# Patient Record
Sex: Female | Born: 1955
Health system: Southern US, Community
[De-identification: ages and names within clinical notes are randomized; demographics above are authoritative.]

## PROBLEM LIST (undated history)

## (undated) DIAGNOSIS — F319 Bipolar disorder, unspecified: Secondary | ICD-10-CM

## (undated) DIAGNOSIS — K529 Noninfective gastroenteritis and colitis, unspecified: Secondary | ICD-10-CM

## (undated) DIAGNOSIS — F32A Depression, unspecified: Secondary | ICD-10-CM

## (undated) DIAGNOSIS — H8110 Benign paroxysmal vertigo, unspecified ear: Principal | ICD-10-CM

## (undated) DIAGNOSIS — G709 Myoneural disorder, unspecified: Secondary | ICD-10-CM

## (undated) DIAGNOSIS — K589 Irritable bowel syndrome without diarrhea: Secondary | ICD-10-CM

## (undated) DIAGNOSIS — C801 Malignant (primary) neoplasm, unspecified: Secondary | ICD-10-CM

## (undated) DIAGNOSIS — F329 Major depressive disorder, single episode, unspecified: Secondary | ICD-10-CM

## (undated) DIAGNOSIS — M1990 Unspecified osteoarthritis, unspecified site: Secondary | ICD-10-CM

## (undated) DIAGNOSIS — N3090 Cystitis, unspecified without hematuria: Secondary | ICD-10-CM

## (undated) DIAGNOSIS — G809 Cerebral palsy, unspecified: Secondary | ICD-10-CM

## (undated) DIAGNOSIS — K219 Gastro-esophageal reflux disease without esophagitis: Secondary | ICD-10-CM

## (undated) DIAGNOSIS — N390 Urinary tract infection, site not specified: Secondary | ICD-10-CM

## (undated) DIAGNOSIS — R569 Unspecified convulsions: Secondary | ICD-10-CM

## (undated) DIAGNOSIS — J449 Chronic obstructive pulmonary disease, unspecified: Secondary | ICD-10-CM

## (undated) DIAGNOSIS — B379 Candidiasis, unspecified: Secondary | ICD-10-CM

## (undated) DIAGNOSIS — M4802 Spinal stenosis, cervical region: Secondary | ICD-10-CM

## (undated) DIAGNOSIS — E119 Type 2 diabetes mellitus without complications: Secondary | ICD-10-CM

## (undated) DIAGNOSIS — N879 Dysplasia of cervix uteri, unspecified: Secondary | ICD-10-CM

## (undated) DIAGNOSIS — D039 Melanoma in situ, unspecified: Secondary | ICD-10-CM

## (undated) DIAGNOSIS — N83209 Unspecified ovarian cyst, unspecified side: Secondary | ICD-10-CM

## (undated) DIAGNOSIS — A499 Bacterial infection, unspecified: Secondary | ICD-10-CM

## (undated) DIAGNOSIS — Z978 Presence of other specified devices: Secondary | ICD-10-CM

## (undated) DIAGNOSIS — A419 Sepsis, unspecified organism: Secondary | ICD-10-CM

## (undated) DIAGNOSIS — E220 Acromegaly and pituitary gigantism: Secondary | ICD-10-CM

## (undated) DIAGNOSIS — G629 Polyneuropathy, unspecified: Secondary | ICD-10-CM

## (undated) DIAGNOSIS — J189 Pneumonia, unspecified organism: Secondary | ICD-10-CM

## (undated) DIAGNOSIS — A599 Trichomoniasis, unspecified: Secondary | ICD-10-CM

## (undated) DIAGNOSIS — E7800 Pure hypercholesterolemia, unspecified: Secondary | ICD-10-CM

## (undated) DIAGNOSIS — R51 Headache: Secondary | ICD-10-CM

## (undated) DIAGNOSIS — G4730 Sleep apnea, unspecified: Secondary | ICD-10-CM

## (undated) DIAGNOSIS — R0989 Other specified symptoms and signs involving the circulatory and respiratory systems: Secondary | ICD-10-CM

## (undated) DIAGNOSIS — R221 Localized swelling, mass and lump, neck: Secondary | ICD-10-CM

## (undated) DIAGNOSIS — E876 Hypokalemia: Secondary | ICD-10-CM

## (undated) DIAGNOSIS — Z8619 Personal history of other infectious and parasitic diseases: Secondary | ICD-10-CM

## (undated) DIAGNOSIS — F419 Anxiety disorder, unspecified: Secondary | ICD-10-CM

## (undated) DIAGNOSIS — F688 Other specified disorders of adult personality and behavior: Secondary | ICD-10-CM

## (undated) DIAGNOSIS — N159 Renal tubulo-interstitial disease, unspecified: Secondary | ICD-10-CM

## (undated) HISTORY — DX: Unspecified ovarian cyst, unspecified side: N83.209

## (undated) HISTORY — DX: Spinal stenosis, cervical region: M48.02

## (undated) HISTORY — DX: Gastro-esophageal reflux disease without esophagitis: K21.9

## (undated) HISTORY — DX: Other specified disorders of adult personality and behavior: F68.8

## (undated) HISTORY — DX: Trichomoniasis, unspecified: A59.9

## (undated) HISTORY — DX: Candidiasis, unspecified: B37.9

## (undated) HISTORY — DX: Unspecified osteoarthritis, unspecified site: M19.90

## (undated) HISTORY — DX: Dysplasia of cervix uteri, unspecified: N87.9

## (undated) HISTORY — DX: Cystitis, unspecified without hematuria: N30.90

## (undated) HISTORY — DX: Noninfective gastroenteritis and colitis, unspecified: K52.9

## (undated) HISTORY — PX: APPENDECTOMY: SHX54

## (undated) HISTORY — DX: Cerebral palsy, unspecified: G80.9

## (undated) HISTORY — PX: JOINT REPLACEMENT: SHX530

## (undated) HISTORY — DX: Renal tubulo-interstitial disease, unspecified: N15.9

## (undated) HISTORY — DX: Major depressive disorder, single episode, unspecified: F32.9

## (undated) HISTORY — DX: Benign paroxysmal vertigo, unspecified ear: H81.10

## (undated) HISTORY — DX: Headache: R51

## (undated) HISTORY — DX: Malignant (primary) neoplasm, unspecified: C80.1

## (undated) HISTORY — PX: LEG TENDON SURGERY: SHX1004

## (undated) HISTORY — DX: Pure hypercholesterolemia, unspecified: E78.00

## (undated) HISTORY — DX: Localized swelling, mass and lump, neck: R22.1

## (undated) HISTORY — PX: CARPAL TUNNEL RELEASE: SHX101

## (undated) HISTORY — DX: Depression, unspecified: F32.A

## (undated) HISTORY — PX: OTHER SURGICAL HISTORY: SHX169

## (undated) HISTORY — DX: Personal history of other infectious and parasitic diseases: Z86.19

## (undated) HISTORY — DX: Anxiety disorder, unspecified: F41.9

## (undated) HISTORY — PX: NASAL FRACTURE SURGERY: SHX718

## (undated) HISTORY — DX: Type 2 diabetes mellitus without complications: E11.9

## (undated) HISTORY — DX: Bacterial infection, unspecified: A49.9

## (undated) HISTORY — PX: OVARIAN CYST REMOVAL: SHX89

## (undated) HISTORY — DX: Melanoma in situ, unspecified: D03.9

## (undated) HISTORY — DX: Irritable bowel syndrome, unspecified: K58.9

## (undated) HISTORY — DX: Acromegaly and pituitary gigantism: E22.0

---

## 1998-03-08 ENCOUNTER — Other Ambulatory Visit: Admission: RE | Admit: 1998-03-08 | Discharge: 1998-03-08 | Payer: Self-pay | Admitting: *Deleted

## 1998-08-08 ENCOUNTER — Other Ambulatory Visit: Admission: RE | Admit: 1998-08-08 | Discharge: 1998-08-08 | Payer: Self-pay | Admitting: *Deleted

## 1998-09-26 ENCOUNTER — Other Ambulatory Visit: Admission: RE | Admit: 1998-09-26 | Discharge: 1998-09-26 | Payer: Self-pay | Admitting: *Deleted

## 1999-02-19 ENCOUNTER — Emergency Department (HOSPITAL_COMMUNITY): Admission: EM | Admit: 1999-02-19 | Discharge: 1999-02-19 | Payer: Self-pay | Admitting: Emergency Medicine

## 1999-06-20 ENCOUNTER — Emergency Department (HOSPITAL_COMMUNITY): Admission: EM | Admit: 1999-06-20 | Discharge: 1999-06-20 | Payer: Self-pay | Admitting: *Deleted

## 1999-10-26 ENCOUNTER — Other Ambulatory Visit: Admission: RE | Admit: 1999-10-26 | Discharge: 1999-10-26 | Payer: Self-pay | Admitting: *Deleted

## 2000-04-01 ENCOUNTER — Emergency Department (HOSPITAL_COMMUNITY): Admission: EM | Admit: 2000-04-01 | Discharge: 2000-04-01 | Payer: Self-pay | Admitting: Emergency Medicine

## 2000-04-02 ENCOUNTER — Encounter: Payer: Self-pay | Admitting: Emergency Medicine

## 2000-04-06 ENCOUNTER — Emergency Department (HOSPITAL_COMMUNITY): Admission: EM | Admit: 2000-04-06 | Discharge: 2000-04-06 | Payer: Self-pay | Admitting: Emergency Medicine

## 2002-01-13 ENCOUNTER — Encounter: Admission: RE | Admit: 2002-01-13 | Discharge: 2002-01-13 | Payer: Self-pay | Admitting: Otolaryngology

## 2002-01-13 ENCOUNTER — Encounter: Payer: Self-pay | Admitting: Otolaryngology

## 2003-04-16 ENCOUNTER — Emergency Department (HOSPITAL_COMMUNITY): Admission: EM | Admit: 2003-04-16 | Discharge: 2003-04-16 | Payer: Self-pay | Admitting: Emergency Medicine

## 2003-04-16 ENCOUNTER — Encounter: Payer: Self-pay | Admitting: Emergency Medicine

## 2004-06-30 ENCOUNTER — Ambulatory Visit (HOSPITAL_BASED_OUTPATIENT_CLINIC_OR_DEPARTMENT_OTHER): Admission: RE | Admit: 2004-06-30 | Discharge: 2004-06-30 | Payer: Self-pay | Admitting: Surgery

## 2004-07-27 ENCOUNTER — Ambulatory Visit (HOSPITAL_BASED_OUTPATIENT_CLINIC_OR_DEPARTMENT_OTHER): Admission: RE | Admit: 2004-07-27 | Discharge: 2004-07-27 | Payer: Self-pay | Admitting: Orthopaedic Surgery

## 2004-09-15 ENCOUNTER — Ambulatory Visit: Payer: Self-pay | Admitting: Endocrinology

## 2004-09-18 ENCOUNTER — Ambulatory Visit: Payer: Self-pay | Admitting: Endocrinology

## 2004-09-25 ENCOUNTER — Ambulatory Visit: Payer: Self-pay | Admitting: Endocrinology

## 2004-10-16 ENCOUNTER — Ambulatory Visit: Payer: Self-pay | Admitting: Internal Medicine

## 2004-10-25 ENCOUNTER — Ambulatory Visit: Payer: Self-pay | Admitting: Endocrinology

## 2004-10-29 HISTORY — PX: REPLACEMENT TOTAL KNEE BILATERAL: SUR1225

## 2004-12-07 ENCOUNTER — Ambulatory Visit: Payer: Self-pay | Admitting: Endocrinology

## 2004-12-18 ENCOUNTER — Ambulatory Visit: Payer: Self-pay

## 2005-01-18 ENCOUNTER — Ambulatory Visit: Payer: Self-pay | Admitting: Internal Medicine

## 2005-04-16 ENCOUNTER — Ambulatory Visit: Payer: Self-pay | Admitting: Endocrinology

## 2005-04-18 ENCOUNTER — Ambulatory Visit: Payer: Self-pay | Admitting: Endocrinology

## 2005-04-30 ENCOUNTER — Ambulatory Visit: Payer: Self-pay | Admitting: Endocrinology

## 2005-06-06 ENCOUNTER — Ambulatory Visit (HOSPITAL_COMMUNITY): Admission: RE | Admit: 2005-06-06 | Discharge: 2005-06-06 | Payer: Self-pay

## 2005-07-04 ENCOUNTER — Ambulatory Visit: Payer: Self-pay | Admitting: Endocrinology

## 2005-09-13 ENCOUNTER — Ambulatory Visit: Payer: Self-pay | Admitting: Endocrinology

## 2005-10-31 ENCOUNTER — Ambulatory Visit: Payer: Self-pay | Admitting: Endocrinology

## 2005-12-05 ENCOUNTER — Ambulatory Visit: Payer: Self-pay | Admitting: Endocrinology

## 2006-03-06 ENCOUNTER — Ambulatory Visit: Payer: Self-pay | Admitting: Endocrinology

## 2006-03-12 ENCOUNTER — Inpatient Hospital Stay (HOSPITAL_COMMUNITY): Admission: RE | Admit: 2006-03-12 | Discharge: 2006-03-16 | Payer: Self-pay | Admitting: Orthopedic Surgery

## 2006-05-09 ENCOUNTER — Ambulatory Visit: Payer: Self-pay | Admitting: Endocrinology

## 2006-05-10 ENCOUNTER — Inpatient Hospital Stay (HOSPITAL_COMMUNITY): Admission: RE | Admit: 2006-05-10 | Discharge: 2006-05-15 | Payer: Self-pay | Admitting: Orthopedic Surgery

## 2006-05-13 ENCOUNTER — Ambulatory Visit: Payer: Self-pay | Admitting: Physical Medicine & Rehabilitation

## 2006-05-13 ENCOUNTER — Encounter: Payer: Self-pay | Admitting: Vascular Surgery

## 2006-05-18 ENCOUNTER — Emergency Department (HOSPITAL_COMMUNITY): Admission: EM | Admit: 2006-05-18 | Discharge: 2006-05-18 | Payer: Self-pay | Admitting: Emergency Medicine

## 2006-05-30 ENCOUNTER — Ambulatory Visit: Payer: Self-pay | Admitting: Endocrinology

## 2006-06-19 ENCOUNTER — Ambulatory Visit: Payer: Self-pay | Admitting: Gastroenterology

## 2006-07-24 ENCOUNTER — Ambulatory Visit: Payer: Self-pay | Admitting: Gastroenterology

## 2006-09-02 ENCOUNTER — Ambulatory Visit (HOSPITAL_COMMUNITY): Payer: Self-pay | Admitting: Psychiatry

## 2006-09-21 ENCOUNTER — Ambulatory Visit: Payer: Self-pay | Admitting: Family Medicine

## 2006-09-23 ENCOUNTER — Ambulatory Visit (HOSPITAL_COMMUNITY): Payer: Self-pay | Admitting: Psychiatry

## 2006-09-30 ENCOUNTER — Ambulatory Visit: Payer: Self-pay | Admitting: Endocrinology

## 2006-09-30 ENCOUNTER — Encounter: Admission: RE | Admit: 2006-09-30 | Discharge: 2006-09-30 | Payer: Self-pay | Admitting: Endocrinology

## 2006-09-30 LAB — CONVERTED CEMR LAB
Bilirubin Urine: NEGATIVE
CO2: 29 meq/L (ref 19–32)
Chloride: 104 meq/L (ref 96–112)
Creatinine, Ser: 0.7 mg/dL (ref 0.4–1.2)
GFR calc non Af Amer: 95 mL/min
Nitrite: NEGATIVE
Sodium: 141 meq/L (ref 135–145)
pH: 6 (ref 5.0–8.0)

## 2006-12-13 ENCOUNTER — Ambulatory Visit (HOSPITAL_COMMUNITY): Payer: Self-pay | Admitting: Psychiatry

## 2007-02-10 ENCOUNTER — Ambulatory Visit (HOSPITAL_COMMUNITY): Payer: Self-pay | Admitting: Psychiatry

## 2007-02-14 ENCOUNTER — Ambulatory Visit: Payer: Self-pay | Admitting: Endocrinology

## 2007-02-24 ENCOUNTER — Encounter: Admission: RE | Admit: 2007-02-24 | Discharge: 2007-02-24 | Payer: Self-pay | Admitting: Endocrinology

## 2007-02-26 ENCOUNTER — Encounter: Admission: RE | Admit: 2007-02-26 | Discharge: 2007-05-22 | Payer: Self-pay | Admitting: Orthopedic Surgery

## 2007-03-03 ENCOUNTER — Ambulatory Visit (HOSPITAL_COMMUNITY): Payer: Self-pay | Admitting: Psychiatry

## 2007-04-09 ENCOUNTER — Ambulatory Visit: Payer: Self-pay | Admitting: Endocrinology

## 2007-04-09 LAB — CONVERTED CEMR LAB
Albumin: 3.7 g/dL (ref 3.5–5.2)
Alkaline Phosphatase: 68 units/L (ref 39–117)
BUN: 9 mg/dL (ref 6–23)
Basophils Absolute: 0.1 10*3/uL (ref 0.0–0.1)
Basophils Relative: 0.7 % (ref 0.0–1.0)
CO2: 27 meq/L (ref 19–32)
Chloride: 101 meq/L (ref 96–112)
Cholesterol: 186 mg/dL (ref 0–200)
Creatinine, Ser: 0.6 mg/dL (ref 0.4–1.2)
Crystals: NEGATIVE
GFR calc non Af Amer: 113 mL/min
HCT: 41.7 % (ref 36.0–46.0)
HDL: 61.6 mg/dL (ref >39.0)
Hemoglobin, Urine: NEGATIVE
LDL Cholesterol: 92 mg/dL (ref 0–99)
Lymphocytes Relative: 27.4 % (ref 12.0–46.0)
MCHC: 34.8 g/dL (ref 30.0–36.0)
MCV: 84.2 fL (ref 78.0–100.0)
Monocytes Relative: 7.4 % (ref 3.0–11.0)
Potassium: 4 meq/L (ref 3.5–5.1)
RDW: 12.9 % (ref 11.5–14.6)
Specific Gravity, Urine: 1.02 (ref 1.000–1.03)
Total Bilirubin: 0.7 mg/dL (ref 0.3–1.2)
Triglycerides: 160 mg/dL — ABNORMAL HIGH (ref 0–149)
WBC: 7.6 10*3/uL (ref 4.5–10.5)
pH: 6 (ref 5.0–8.0)

## 2007-04-21 ENCOUNTER — Ambulatory Visit (HOSPITAL_COMMUNITY): Payer: Self-pay | Admitting: Psychiatry

## 2007-05-10 ENCOUNTER — Encounter: Payer: Self-pay | Admitting: Endocrinology

## 2007-05-10 DIAGNOSIS — E785 Hyperlipidemia, unspecified: Secondary | ICD-10-CM

## 2007-05-10 DIAGNOSIS — J309 Allergic rhinitis, unspecified: Secondary | ICD-10-CM | POA: Insufficient documentation

## 2007-05-10 DIAGNOSIS — F329 Major depressive disorder, single episode, unspecified: Secondary | ICD-10-CM

## 2007-05-14 ENCOUNTER — Ambulatory Visit: Payer: Self-pay | Admitting: Endocrinology

## 2007-05-14 LAB — CONVERTED CEMR LAB
ALT: 43 units/L — ABNORMAL HIGH (ref 0–35)
Bilirubin, Direct: 0.1 mg/dL (ref 0.0–0.3)
Total Bilirubin: 0.6 mg/dL (ref 0.3–1.2)

## 2007-06-16 ENCOUNTER — Ambulatory Visit (HOSPITAL_COMMUNITY): Payer: Self-pay | Admitting: Psychiatry

## 2007-06-17 ENCOUNTER — Ambulatory Visit: Payer: Self-pay | Admitting: Endocrinology

## 2007-06-17 LAB — CONVERTED CEMR LAB
BUN: 4 mg/dL — ABNORMAL LOW (ref 6–23)
Creatinine, Ser: 0.6 mg/dL (ref 0.4–1.2)

## 2007-06-23 ENCOUNTER — Ambulatory Visit: Payer: Self-pay | Admitting: Cardiology

## 2007-07-16 ENCOUNTER — Ambulatory Visit (HOSPITAL_COMMUNITY): Payer: Self-pay | Admitting: Psychiatry

## 2007-07-24 ENCOUNTER — Ambulatory Visit (HOSPITAL_COMMUNITY): Payer: Self-pay | Admitting: Licensed Clinical Social Worker

## 2007-07-31 ENCOUNTER — Ambulatory Visit (HOSPITAL_COMMUNITY): Payer: Self-pay | Admitting: Licensed Clinical Social Worker

## 2007-08-07 ENCOUNTER — Ambulatory Visit (HOSPITAL_COMMUNITY): Payer: Self-pay | Admitting: Licensed Clinical Social Worker

## 2007-08-12 ENCOUNTER — Ambulatory Visit (HOSPITAL_COMMUNITY): Payer: Self-pay | Admitting: Licensed Clinical Social Worker

## 2007-08-13 ENCOUNTER — Encounter: Payer: Self-pay | Admitting: Internal Medicine

## 2007-08-13 ENCOUNTER — Ambulatory Visit: Payer: Self-pay | Admitting: Internal Medicine

## 2007-08-13 ENCOUNTER — Ambulatory Visit (HOSPITAL_COMMUNITY): Payer: Self-pay | Admitting: Psychiatry

## 2007-08-13 DIAGNOSIS — E220 Acromegaly and pituitary gigantism: Secondary | ICD-10-CM

## 2007-08-13 DIAGNOSIS — H60509 Unspecified acute noninfective otitis externa, unspecified ear: Secondary | ICD-10-CM

## 2007-08-13 DIAGNOSIS — Z8719 Personal history of other diseases of the digestive system: Secondary | ICD-10-CM

## 2007-08-13 DIAGNOSIS — Z8639 Personal history of other endocrine, nutritional and metabolic disease: Secondary | ICD-10-CM | POA: Insufficient documentation

## 2007-08-13 DIAGNOSIS — Z862 Personal history of diseases of the blood and blood-forming organs and certain disorders involving the immune mechanism: Secondary | ICD-10-CM

## 2007-08-13 DIAGNOSIS — G809 Cerebral palsy, unspecified: Secondary | ICD-10-CM | POA: Insufficient documentation

## 2007-08-18 ENCOUNTER — Telehealth (INDEPENDENT_AMBULATORY_CARE_PROVIDER_SITE_OTHER): Payer: Self-pay | Admitting: *Deleted

## 2007-08-18 ENCOUNTER — Encounter: Payer: Self-pay | Admitting: Endocrinology

## 2007-08-21 ENCOUNTER — Ambulatory Visit (HOSPITAL_COMMUNITY): Payer: Self-pay | Admitting: Licensed Clinical Social Worker

## 2007-08-25 ENCOUNTER — Encounter: Payer: Self-pay | Admitting: Endocrinology

## 2007-09-01 ENCOUNTER — Ambulatory Visit (HOSPITAL_COMMUNITY): Payer: Self-pay | Admitting: Licensed Clinical Social Worker

## 2007-09-05 ENCOUNTER — Ambulatory Visit: Payer: Self-pay | Admitting: Internal Medicine

## 2007-09-05 ENCOUNTER — Encounter: Payer: Self-pay | Admitting: Internal Medicine

## 2007-09-05 DIAGNOSIS — J438 Other emphysema: Secondary | ICD-10-CM

## 2007-09-05 DIAGNOSIS — R220 Localized swelling, mass and lump, head: Secondary | ICD-10-CM

## 2007-09-05 DIAGNOSIS — R221 Localized swelling, mass and lump, neck: Secondary | ICD-10-CM

## 2007-09-11 ENCOUNTER — Ambulatory Visit (HOSPITAL_COMMUNITY): Payer: Self-pay | Admitting: Licensed Clinical Social Worker

## 2007-09-17 ENCOUNTER — Ambulatory Visit (HOSPITAL_COMMUNITY): Payer: Self-pay | Admitting: Licensed Clinical Social Worker

## 2007-09-23 ENCOUNTER — Ambulatory Visit (HOSPITAL_COMMUNITY): Payer: Self-pay | Admitting: Licensed Clinical Social Worker

## 2007-09-30 ENCOUNTER — Ambulatory Visit (HOSPITAL_COMMUNITY): Payer: Self-pay | Admitting: Licensed Clinical Social Worker

## 2007-10-03 ENCOUNTER — Encounter: Admission: RE | Admit: 2007-10-03 | Discharge: 2007-10-03 | Payer: Self-pay | Admitting: Otolaryngology

## 2007-10-09 ENCOUNTER — Ambulatory Visit (HOSPITAL_COMMUNITY): Payer: Self-pay | Admitting: Licensed Clinical Social Worker

## 2007-10-13 ENCOUNTER — Ambulatory Visit (HOSPITAL_COMMUNITY): Payer: Self-pay | Admitting: Psychiatry

## 2007-10-20 ENCOUNTER — Ambulatory Visit (HOSPITAL_COMMUNITY): Payer: Self-pay | Admitting: Licensed Clinical Social Worker

## 2007-10-27 ENCOUNTER — Ambulatory Visit (HOSPITAL_COMMUNITY): Payer: Self-pay | Admitting: Licensed Clinical Social Worker

## 2007-11-04 ENCOUNTER — Ambulatory Visit (HOSPITAL_COMMUNITY): Payer: Self-pay | Admitting: Licensed Clinical Social Worker

## 2007-11-19 ENCOUNTER — Ambulatory Visit (HOSPITAL_COMMUNITY): Payer: Self-pay | Admitting: Licensed Clinical Social Worker

## 2007-11-27 ENCOUNTER — Ambulatory Visit (HOSPITAL_COMMUNITY): Payer: Self-pay | Admitting: Licensed Clinical Social Worker

## 2007-12-04 ENCOUNTER — Ambulatory Visit (HOSPITAL_COMMUNITY): Payer: Self-pay | Admitting: Licensed Clinical Social Worker

## 2007-12-04 ENCOUNTER — Ambulatory Visit: Payer: Self-pay | Admitting: Endocrinology

## 2007-12-04 DIAGNOSIS — R21 Rash and other nonspecific skin eruption: Secondary | ICD-10-CM

## 2007-12-04 DIAGNOSIS — K589 Irritable bowel syndrome without diarrhea: Secondary | ICD-10-CM

## 2007-12-09 ENCOUNTER — Ambulatory Visit (HOSPITAL_COMMUNITY): Payer: Self-pay | Admitting: Licensed Clinical Social Worker

## 2007-12-10 ENCOUNTER — Ambulatory Visit (HOSPITAL_COMMUNITY): Payer: Self-pay | Admitting: Psychiatry

## 2007-12-25 ENCOUNTER — Ambulatory Visit (HOSPITAL_COMMUNITY): Payer: Self-pay | Admitting: Licensed Clinical Social Worker

## 2007-12-30 ENCOUNTER — Ambulatory Visit (HOSPITAL_COMMUNITY): Payer: Self-pay | Admitting: Licensed Clinical Social Worker

## 2008-01-07 ENCOUNTER — Ambulatory Visit (HOSPITAL_COMMUNITY): Payer: Self-pay | Admitting: Licensed Clinical Social Worker

## 2008-01-15 ENCOUNTER — Emergency Department (HOSPITAL_COMMUNITY): Admission: EM | Admit: 2008-01-15 | Discharge: 2008-01-15 | Payer: Self-pay | Admitting: Emergency Medicine

## 2008-01-23 ENCOUNTER — Encounter: Admission: RE | Admit: 2008-01-23 | Discharge: 2008-01-23 | Payer: Self-pay | Admitting: Family Medicine

## 2008-02-04 ENCOUNTER — Ambulatory Visit (HOSPITAL_COMMUNITY): Payer: Self-pay | Admitting: Psychiatry

## 2008-02-09 ENCOUNTER — Ambulatory Visit (HOSPITAL_COMMUNITY): Payer: Self-pay | Admitting: Licensed Clinical Social Worker

## 2008-02-23 ENCOUNTER — Ambulatory Visit (HOSPITAL_COMMUNITY): Payer: Self-pay | Admitting: Licensed Clinical Social Worker

## 2008-03-03 ENCOUNTER — Ambulatory Visit (HOSPITAL_COMMUNITY): Payer: Self-pay | Admitting: Psychiatry

## 2008-03-11 ENCOUNTER — Ambulatory Visit (HOSPITAL_COMMUNITY): Payer: Self-pay | Admitting: Licensed Clinical Social Worker

## 2008-04-19 ENCOUNTER — Ambulatory Visit (HOSPITAL_COMMUNITY): Payer: Self-pay | Admitting: Licensed Clinical Social Worker

## 2008-05-10 ENCOUNTER — Ambulatory Visit (HOSPITAL_COMMUNITY): Payer: Self-pay | Admitting: Psychiatry

## 2008-06-01 ENCOUNTER — Ambulatory Visit (HOSPITAL_COMMUNITY): Payer: Self-pay | Admitting: Licensed Clinical Social Worker

## 2008-06-14 ENCOUNTER — Ambulatory Visit (HOSPITAL_COMMUNITY): Payer: Self-pay | Admitting: Psychiatry

## 2008-06-15 ENCOUNTER — Ambulatory Visit (HOSPITAL_COMMUNITY): Payer: Self-pay | Admitting: Licensed Clinical Social Worker

## 2008-07-08 ENCOUNTER — Ambulatory Visit (HOSPITAL_COMMUNITY): Payer: Self-pay | Admitting: Licensed Clinical Social Worker

## 2008-07-19 ENCOUNTER — Ambulatory Visit (HOSPITAL_COMMUNITY): Payer: Self-pay | Admitting: Licensed Clinical Social Worker

## 2008-08-09 ENCOUNTER — Ambulatory Visit (HOSPITAL_COMMUNITY): Payer: Self-pay | Admitting: Psychiatry

## 2008-08-17 ENCOUNTER — Ambulatory Visit (HOSPITAL_COMMUNITY): Payer: Self-pay | Admitting: Licensed Clinical Social Worker

## 2008-09-02 ENCOUNTER — Ambulatory Visit (HOSPITAL_COMMUNITY): Payer: Self-pay | Admitting: Licensed Clinical Social Worker

## 2008-09-09 ENCOUNTER — Ambulatory Visit (HOSPITAL_COMMUNITY): Payer: Self-pay | Admitting: Licensed Clinical Social Worker

## 2008-09-21 ENCOUNTER — Ambulatory Visit (HOSPITAL_COMMUNITY): Payer: Self-pay | Admitting: Licensed Clinical Social Worker

## 2008-10-06 ENCOUNTER — Ambulatory Visit (HOSPITAL_COMMUNITY): Payer: Self-pay | Admitting: Licensed Clinical Social Worker

## 2008-10-20 ENCOUNTER — Ambulatory Visit (HOSPITAL_COMMUNITY): Payer: Self-pay | Admitting: Licensed Clinical Social Worker

## 2008-11-02 ENCOUNTER — Ambulatory Visit (HOSPITAL_COMMUNITY): Payer: Self-pay | Admitting: Licensed Clinical Social Worker

## 2008-11-03 ENCOUNTER — Ambulatory Visit (HOSPITAL_COMMUNITY): Payer: Self-pay | Admitting: Psychiatry

## 2008-11-15 ENCOUNTER — Ambulatory Visit (HOSPITAL_COMMUNITY): Payer: Self-pay | Admitting: Licensed Clinical Social Worker

## 2008-12-09 ENCOUNTER — Ambulatory Visit (HOSPITAL_COMMUNITY): Payer: Self-pay | Admitting: Licensed Clinical Social Worker

## 2008-12-16 ENCOUNTER — Ambulatory Visit (HOSPITAL_COMMUNITY): Payer: Self-pay | Admitting: Licensed Clinical Social Worker

## 2008-12-23 ENCOUNTER — Ambulatory Visit (HOSPITAL_COMMUNITY): Payer: Self-pay | Admitting: Licensed Clinical Social Worker

## 2009-01-03 ENCOUNTER — Ambulatory Visit (HOSPITAL_COMMUNITY): Payer: Self-pay | Admitting: Psychiatry

## 2009-01-06 ENCOUNTER — Ambulatory Visit (HOSPITAL_COMMUNITY): Payer: Self-pay | Admitting: Licensed Clinical Social Worker

## 2009-01-20 ENCOUNTER — Ambulatory Visit (HOSPITAL_COMMUNITY): Payer: Self-pay | Admitting: Licensed Clinical Social Worker

## 2009-02-03 ENCOUNTER — Ambulatory Visit (HOSPITAL_COMMUNITY): Payer: Self-pay | Admitting: Licensed Clinical Social Worker

## 2009-02-22 ENCOUNTER — Ambulatory Visit (HOSPITAL_COMMUNITY): Payer: Self-pay | Admitting: Licensed Clinical Social Worker

## 2009-02-28 ENCOUNTER — Ambulatory Visit (HOSPITAL_COMMUNITY): Payer: Self-pay | Admitting: Psychiatry

## 2009-03-08 ENCOUNTER — Ambulatory Visit (HOSPITAL_COMMUNITY): Payer: Self-pay | Admitting: Licensed Clinical Social Worker

## 2009-03-15 ENCOUNTER — Ambulatory Visit (HOSPITAL_COMMUNITY): Payer: Self-pay | Admitting: Licensed Clinical Social Worker

## 2009-03-28 ENCOUNTER — Emergency Department (HOSPITAL_COMMUNITY): Admission: EM | Admit: 2009-03-28 | Discharge: 2009-03-28 | Payer: Self-pay | Admitting: Family Medicine

## 2009-03-31 ENCOUNTER — Ambulatory Visit (HOSPITAL_COMMUNITY): Payer: Self-pay | Admitting: Licensed Clinical Social Worker

## 2009-04-06 ENCOUNTER — Ambulatory Visit (HOSPITAL_COMMUNITY): Payer: Self-pay | Admitting: Licensed Clinical Social Worker

## 2009-04-20 ENCOUNTER — Ambulatory Visit (HOSPITAL_COMMUNITY): Payer: Self-pay | Admitting: Licensed Clinical Social Worker

## 2009-05-04 ENCOUNTER — Ambulatory Visit (HOSPITAL_COMMUNITY): Payer: Self-pay | Admitting: Licensed Clinical Social Worker

## 2009-05-18 ENCOUNTER — Ambulatory Visit (HOSPITAL_COMMUNITY): Payer: Self-pay | Admitting: Licensed Clinical Social Worker

## 2009-06-15 ENCOUNTER — Ambulatory Visit (HOSPITAL_COMMUNITY): Payer: Self-pay | Admitting: Licensed Clinical Social Worker

## 2009-06-22 ENCOUNTER — Ambulatory Visit (HOSPITAL_COMMUNITY): Payer: Self-pay | Admitting: Psychiatry

## 2009-06-29 ENCOUNTER — Ambulatory Visit (HOSPITAL_COMMUNITY): Payer: Self-pay | Admitting: Licensed Clinical Social Worker

## 2009-07-14 ENCOUNTER — Ambulatory Visit (HOSPITAL_COMMUNITY): Payer: Self-pay | Admitting: Licensed Clinical Social Worker

## 2009-07-27 ENCOUNTER — Ambulatory Visit (HOSPITAL_COMMUNITY): Payer: Self-pay | Admitting: Licensed Clinical Social Worker

## 2009-08-10 ENCOUNTER — Ambulatory Visit (HOSPITAL_COMMUNITY): Payer: Self-pay | Admitting: Licensed Clinical Social Worker

## 2009-08-22 ENCOUNTER — Ambulatory Visit (HOSPITAL_COMMUNITY): Payer: Self-pay | Admitting: Psychiatry

## 2009-08-24 ENCOUNTER — Ambulatory Visit (HOSPITAL_COMMUNITY): Payer: Self-pay | Admitting: Licensed Clinical Social Worker

## 2009-09-13 ENCOUNTER — Encounter: Admission: RE | Admit: 2009-09-13 | Discharge: 2009-10-19 | Payer: Self-pay | Admitting: Orthopedic Surgery

## 2009-09-14 ENCOUNTER — Ambulatory Visit (HOSPITAL_COMMUNITY): Payer: Self-pay | Admitting: Licensed Clinical Social Worker

## 2009-09-28 ENCOUNTER — Ambulatory Visit (HOSPITAL_COMMUNITY): Payer: Self-pay | Admitting: Licensed Clinical Social Worker

## 2009-09-30 ENCOUNTER — Ambulatory Visit (HOSPITAL_COMMUNITY): Payer: Self-pay | Admitting: Psychiatry

## 2009-10-26 ENCOUNTER — Ambulatory Visit (HOSPITAL_COMMUNITY): Payer: Self-pay | Admitting: Licensed Clinical Social Worker

## 2009-11-14 ENCOUNTER — Ambulatory Visit (HOSPITAL_COMMUNITY): Payer: Self-pay | Admitting: Psychiatry

## 2009-12-02 ENCOUNTER — Ambulatory Visit (HOSPITAL_COMMUNITY): Payer: Self-pay | Admitting: Licensed Clinical Social Worker

## 2009-12-15 ENCOUNTER — Ambulatory Visit (HOSPITAL_COMMUNITY): Payer: Self-pay | Admitting: Licensed Clinical Social Worker

## 2009-12-28 ENCOUNTER — Ambulatory Visit (HOSPITAL_COMMUNITY): Payer: Self-pay | Admitting: Licensed Clinical Social Worker

## 2010-01-25 ENCOUNTER — Ambulatory Visit (HOSPITAL_COMMUNITY): Payer: Self-pay | Admitting: Licensed Clinical Social Worker

## 2010-02-06 ENCOUNTER — Ambulatory Visit (HOSPITAL_COMMUNITY): Payer: Self-pay | Admitting: Psychiatry

## 2010-02-09 ENCOUNTER — Ambulatory Visit (HOSPITAL_COMMUNITY): Payer: Self-pay | Admitting: Licensed Clinical Social Worker

## 2010-02-21 ENCOUNTER — Ambulatory Visit (HOSPITAL_COMMUNITY): Payer: Self-pay | Admitting: Licensed Clinical Social Worker

## 2010-03-07 ENCOUNTER — Ambulatory Visit (HOSPITAL_COMMUNITY): Payer: Self-pay | Admitting: Licensed Clinical Social Worker

## 2010-03-22 ENCOUNTER — Encounter: Admission: RE | Admit: 2010-03-22 | Discharge: 2010-03-22 | Payer: Self-pay | Admitting: Orthopaedic Surgery

## 2010-04-06 ENCOUNTER — Ambulatory Visit (HOSPITAL_COMMUNITY): Payer: Self-pay | Admitting: Licensed Clinical Social Worker

## 2010-04-20 ENCOUNTER — Ambulatory Visit (HOSPITAL_COMMUNITY): Payer: Self-pay | Admitting: Licensed Clinical Social Worker

## 2010-05-12 ENCOUNTER — Ambulatory Visit (HOSPITAL_COMMUNITY): Payer: Self-pay | Admitting: Licensed Clinical Social Worker

## 2010-05-22 ENCOUNTER — Ambulatory Visit (HOSPITAL_COMMUNITY): Payer: Self-pay | Admitting: Psychiatry

## 2010-05-25 ENCOUNTER — Ambulatory Visit (HOSPITAL_COMMUNITY): Payer: Self-pay | Admitting: Licensed Clinical Social Worker

## 2010-06-08 ENCOUNTER — Ambulatory Visit (HOSPITAL_COMMUNITY): Payer: Self-pay | Admitting: Licensed Clinical Social Worker

## 2010-06-27 ENCOUNTER — Ambulatory Visit (HOSPITAL_COMMUNITY): Payer: Self-pay | Admitting: Licensed Clinical Social Worker

## 2010-07-04 ENCOUNTER — Ambulatory Visit (HOSPITAL_COMMUNITY): Payer: Self-pay | Admitting: Licensed Clinical Social Worker

## 2010-07-18 ENCOUNTER — Ambulatory Visit (HOSPITAL_COMMUNITY): Payer: Self-pay | Admitting: Licensed Clinical Social Worker

## 2010-08-03 ENCOUNTER — Ambulatory Visit (HOSPITAL_COMMUNITY): Payer: Self-pay | Admitting: Licensed Clinical Social Worker

## 2010-08-10 ENCOUNTER — Ambulatory Visit (HOSPITAL_COMMUNITY): Payer: Self-pay | Admitting: Licensed Clinical Social Worker

## 2010-08-17 ENCOUNTER — Ambulatory Visit (HOSPITAL_COMMUNITY): Payer: Self-pay | Admitting: Licensed Clinical Social Worker

## 2010-08-21 ENCOUNTER — Ambulatory Visit (HOSPITAL_COMMUNITY): Payer: Self-pay | Admitting: Psychiatry

## 2010-09-07 ENCOUNTER — Ambulatory Visit (HOSPITAL_COMMUNITY): Payer: Self-pay | Admitting: Licensed Clinical Social Worker

## 2010-10-13 ENCOUNTER — Ambulatory Visit (HOSPITAL_COMMUNITY): Payer: Self-pay | Admitting: Licensed Clinical Social Worker

## 2010-10-30 ENCOUNTER — Ambulatory Visit (HOSPITAL_COMMUNITY): Payer: Self-pay | Admitting: Licensed Clinical Social Worker

## 2010-11-17 ENCOUNTER — Ambulatory Visit (HOSPITAL_COMMUNITY)
Admission: RE | Admit: 2010-11-17 | Discharge: 2010-11-17 | Payer: Self-pay | Source: Home / Self Care | Attending: Licensed Clinical Social Worker | Admitting: Licensed Clinical Social Worker

## 2010-11-22 ENCOUNTER — Ambulatory Visit (HOSPITAL_COMMUNITY)
Admission: RE | Admit: 2010-11-22 | Discharge: 2010-11-22 | Payer: Self-pay | Source: Home / Self Care | Attending: Psychiatry | Admitting: Psychiatry

## 2010-11-29 ENCOUNTER — Encounter (HOSPITAL_COMMUNITY): Payer: Medicare Other | Admitting: Licensed Clinical Social Worker

## 2010-11-29 ENCOUNTER — Ambulatory Visit (HOSPITAL_COMMUNITY): Admit: 2010-11-29 | Payer: Self-pay | Admitting: Licensed Clinical Social Worker

## 2010-11-29 DIAGNOSIS — F3289 Other specified depressive episodes: Secondary | ICD-10-CM

## 2010-11-29 DIAGNOSIS — F329 Major depressive disorder, single episode, unspecified: Secondary | ICD-10-CM

## 2010-12-11 ENCOUNTER — Encounter (HOSPITAL_COMMUNITY): Payer: Medicare Other | Admitting: Licensed Clinical Social Worker

## 2010-12-11 DIAGNOSIS — F329 Major depressive disorder, single episode, unspecified: Secondary | ICD-10-CM

## 2010-12-20 ENCOUNTER — Encounter (HOSPITAL_COMMUNITY): Payer: Medicare Other | Admitting: Licensed Clinical Social Worker

## 2010-12-20 DIAGNOSIS — F329 Major depressive disorder, single episode, unspecified: Secondary | ICD-10-CM

## 2011-01-10 ENCOUNTER — Encounter (HOSPITAL_COMMUNITY): Payer: Medicare Other | Admitting: Licensed Clinical Social Worker

## 2011-01-10 DIAGNOSIS — F329 Major depressive disorder, single episode, unspecified: Secondary | ICD-10-CM

## 2011-01-24 ENCOUNTER — Encounter (HOSPITAL_COMMUNITY): Payer: Medicare Other | Admitting: Licensed Clinical Social Worker

## 2011-01-29 ENCOUNTER — Encounter (HOSPITAL_BASED_OUTPATIENT_CLINIC_OR_DEPARTMENT_OTHER): Payer: Medicare Other | Admitting: Licensed Clinical Social Worker

## 2011-01-29 DIAGNOSIS — F329 Major depressive disorder, single episode, unspecified: Secondary | ICD-10-CM

## 2011-02-06 LAB — POCT URINALYSIS DIP (DEVICE)
Hgb urine dipstick: NEGATIVE
Nitrite: NEGATIVE
Protein, ur: NEGATIVE mg/dL
pH: 7 (ref 5.0–8.0)

## 2011-02-06 LAB — URINE CULTURE: Colony Count: 50000

## 2011-02-12 ENCOUNTER — Encounter (HOSPITAL_BASED_OUTPATIENT_CLINIC_OR_DEPARTMENT_OTHER): Payer: Medicare Other | Admitting: Licensed Clinical Social Worker

## 2011-02-12 DIAGNOSIS — F329 Major depressive disorder, single episode, unspecified: Secondary | ICD-10-CM

## 2011-02-21 ENCOUNTER — Encounter (HOSPITAL_COMMUNITY): Payer: Medicare Other | Admitting: Psychiatry

## 2011-02-21 DIAGNOSIS — F3289 Other specified depressive episodes: Secondary | ICD-10-CM

## 2011-02-21 DIAGNOSIS — F329 Major depressive disorder, single episode, unspecified: Secondary | ICD-10-CM

## 2011-02-26 ENCOUNTER — Encounter (HOSPITAL_BASED_OUTPATIENT_CLINIC_OR_DEPARTMENT_OTHER): Payer: Medicare Other | Admitting: Licensed Clinical Social Worker

## 2011-02-26 DIAGNOSIS — F3289 Other specified depressive episodes: Secondary | ICD-10-CM

## 2011-02-26 DIAGNOSIS — F329 Major depressive disorder, single episode, unspecified: Secondary | ICD-10-CM

## 2011-03-12 ENCOUNTER — Encounter (HOSPITAL_BASED_OUTPATIENT_CLINIC_OR_DEPARTMENT_OTHER): Payer: Medicare Other | Admitting: Licensed Clinical Social Worker

## 2011-03-12 DIAGNOSIS — F3289 Other specified depressive episodes: Secondary | ICD-10-CM

## 2011-03-12 DIAGNOSIS — F329 Major depressive disorder, single episode, unspecified: Secondary | ICD-10-CM

## 2011-03-16 NOTE — Op Note (Signed)
NAME:  [name], [name]                         ACCOUNT NO.:  [id number]   MEDICAL RECORD NO.:  [id number]                   PATIENT TYPE:  AMB   LOCATION:  DSC                                  FACILITY:  [facility]   PHYSICIAN:  Vanita Panda. Magnus Ivan, M.D.       DATE OF BIRTH:  [birth year]   DATE OF PROCEDURE:  06/30/2004  DATE OF DISCHARGE:                                 OPERATIVE REPORT   PREOPERATIVE DIAGNOSIS:  Left carpal tunnel syndrome.   POSTOPERATIVE DIAGNOSIS:  Left carpal tunnel syndrome.   PROCEDURE:  Left open carpal tunnel release.   SURGEON:  Vanita Panda. Magnus Ivan, M.D.   ANESTHESIA:  General.   COMPLICATIONS:  None.   TOURNIQUET TIME:  22 minutes.   INDICATIONS FOR PROCEDURE:  Briefly, Ms. Bouton is a 55 year old female with  a known history of bilateral carpal tunnel syndrome.  She had previous nerve  conduction studies in 2000.  She was followed up with her primary care  physician and after failure of night splints and nonsteroidals, a second  nerve conduction study was obtained this year.  Compared to the old studies,  this showed significant worsening in her neuropathy involving the median  nerve.  This was severe carpal tunnel syndrome.  She had motor and sensory  latencies that were noted.  On clinical exam, she had positive Tinel's and  Phalen's sign and positive nerve compression test.  She, fortunately, did  not have any evidence of muscle atrophy but certainly a weak grip strength.  It was recommended that she undergo open carpal tunnel release and she  agreed to proceed with surgery.   DESCRIPTION OF PROCEDURE:  After being brought to the operating room, she  was placed supine on the operating table, and general anesthesia was  obtained with LMA.  A nonsterile tourniquet was placed around her left upper  arm.  Her left arm was prepped and draped with DuraPrep and sterile drapes  with her left arm on the arm table and her left hand in the iron  hand.  Prior to putting her arm down, an Esmarch was used to wrap up the arm and  tourniquet was set for 250 mmHg.  A small incision was then made at the  palmar aspect of her hand just distal to the distal wrist crease.  This was  carried down to the level of the transverse carpal ligament.  This was  identified bluntly using a hemostat and blunt tip scissors.  Next, a Glorious Peach  was inserted underneath the transverse carpal ligament on top of the median  nerve.  The median nerve was easily identified and the Glorious Peach was inserted in  a retrograde fashion underneath the transverse carpal ligament.  The  transverse carpal ligament was then meticulously divided with blunt tip  scissors to allow complete release of the ligament and this was visualized.  Next, the median nerve was assessed and found to be intact and the motor  branch was identified, as well.  The wound was then irrigated and closed  with  interrupted 4-0 nylon suture in a horizontal mattress format with a total of  four sutures.  Xeroform was applied followed by sterile dressings.  The  tourniquet was deflated and the fingers did pinken nicely.  The patient was  awakened, extubated, and taken to the recovery room in stable condition.                                               Vanita Panda. Magnus Ivan, M.D.    CYB/MEDQ  D:  06/30/2004  T:  07/01/2004  Job:  [id number]

## 2011-03-16 NOTE — H&P (Signed)
[name], [name] NO.:  [id number]   MEDICAL RECORD NO.:  [id number]           PATIENT TYPE:   LOCATION:                                 FACILITY:   PHYSICIAN:  Madlyn Frankel. Charlann Boxer, M.D.  DATE OF BIRTH:  08/24/2005   DATE OF ADMISSION:  03/12/2006  DATE OF DISCHARGE:                                HISTORY & PHYSICAL   CHIEF COMPLAINT:  Pain into my right knee.[name]   PRESENT ILLNESS:  This 55 year old female with a history of diplegic  cerebral palsy has been seen by [place] for bilateral knee osteoarthritis.  She  has more difficulty on the right than the left.  In the past she has had a  right distal femoral rotational osteotomy in the 1990s; and removal of the  hardware in May 1995.  This hardware failed and a screw broke in her knee.  The screw  is a DCS-type in the distal femur; and is 1.5 cm from the femoral  notch to the screw itself.  She also has marked degenerative changes in both  knees worse on the right than the left.  This a very active lady who gets  around quite nicely despite her cerebral palsy.  She has been attending  college, and highly desires to maintain the highest level of activity that  she can.  Therefore, after much consideration including discussing with her  the risks and benefits of surgery we have decided to go ahead with total  knee replacement arthroplasty to the right knee with removal of the DCS  screw.   Recently the patient has been diagnosed with acromegaly.   DRUG ALLERGIES:  SUDAFED and MONISTAT.   PAST MEDICAL HISTORY:  The patient has migraine headaches, depression, and  borderline hypertension.  She has infrequent urinary tract infections.  She has had skin cancers removed as well.  Degenerative disk disease in her  lumbar spine.   PAST SURGERIES:  Carpal tunnel in 2005, hardware removal in 1994 for her  right knee, rotator cuff repair in 1998, cartilage removed in her knee in  1993, derotation osteotomy to the femur in  1991, tendon release in 1977,  ovarian cyst removed in 1978.   CURRENT MEDICATIONS:  The patient does not have her medications nor a list  her medications with her today.  Please refer to the medication  reconciliation sheet for current medications which will be obtained when she  goes to the hospital for her preoperative visit.   FAMILY HISTORY:  Positive for heart disease, hypertension, diabetes, cancer,  and emphysema.   SOCIAL HISTORY:  The patient is divorced, disabled, and has no intake of  tobacco and moderate amount of alcohol intake.  She has two children.  Her  daughter and boyfriend will be caretakers after surgery.   REVIEW OF SYSTEMS:  CNS:  No seizure disorder, but she has muscle weakness  consistent with diplegic cerebral palsy.  CARDIOVASCULAR:  No chest pain no  angina, nor orthopnea.  Respiratory:  No productive cough, no hemoptysis, no  shortness of breath.  GASTROINTESTINAL:  No nausea, vomiting,  melena, or  blood stool.  GENITOURINARY:  No discharge, dysuria, or hematuria.  MUSCULOSKELETAL:  Primarily as in present illness.   PHYSICAL EXAMINATION:  GENERAL:  An alert and cooperative friendly 49-year-  old white female who walks with a platform cane.  VITAL SIGNS:  Blood pressure 140/86, pulse 86, respirations 12.  HEENT: Normocephalic.  PERRLA intact.  Oropharynx clear.  CHEST:  Clear to auscultation. No rhonchi or rales.  HEART: Regular rate and rhythm.  No murmurs are heard.  ABDOMEN:  Soft, nontender, liver and spleen not felt.  GENITALIA, RECTAL, PELVIC, BREASTS:  Not done; not pertinent for this exam.  EXTREMITIES:  The patient has crepitus range of motion of the right knee and  a moderate amount of swelling noted.   ADMITTING DIAGNOSES:  1.  Right knee degenerative joint disease.  2.  Retained hardware right knee.  3.  Diplegic cerebral palsy  4.  Early diagnosis acromegaly.   PLAN:  The patient to undergo right total knee replacement arthroplasty  with  removal of the hardware, removal of the DCS screw in the distal femur.  In  all probability she will be able to go home with her family to care for her.      Dooley L. Cherlynn June.      Madlyn Frankel Charlann Boxer, M.D.  Electronically Signed    DLU/MEDQ  D:  03/04/2006  T:  03/04/2006  Job:  [id number]   cc:   Gregary Signs A. Everardo All, M.D. [facility]  [address]  [place]  [place] [address]   Madlyn Frankel. Charlann Boxer, M.D.  Fax: [phone]

## 2011-03-16 NOTE — Assessment & Plan Note (Signed)
[name]                           GASTROENTEROLOGY OFFICE NOTE   NAME:[name], [name]                      MRN:          [id number]  DATE:06/19/2006                            DOB:          Apr 01, 1956    REASON FOR REFERRAL:  Dr. Romero Belling asked me to evaluate [name] in  consultation regarding elevated risk for GI neoplasms, given her underlying  acromegaly.   HISTORY OF PRESENT ILLNESS:  [name] is a very pleasant 55 year old woman  who was recently diagnosed with acromegaly in the past 1 to 2 years. Because  of this, she is at increased risk for colorectal, esophageal, and stomach  cancers. She has had a colonoscopy in the distant past. About 17 years ago,  she was told that she had colitis and that it was caused by stress. She  takes a p.r.n. medicine for this. It is not clear that this was actually a  colitis. It may be irritable bowel syndrome. She does have intermittent  dysphagia symptoms and GERD symptoms that she takes p.r.n. H2 blockers only.   REVIEW OF SYSTEMS:  Notable for 5 to 10 pound weight gain in the past year.  Otherwise, essentially normal and is available on her admission intake  sheet.   PAST MEDICAL HISTORY:  Acromegaly as above, elevated cholesterol, arthritis,  anxiety, panic disorder, depression, appendectomy in 1978.   CURRENT MEDICATIONS:  Zyrtec, Diclofenac, Zocor, Vesicare, Skelaxin,  Neurontin, aspirin, melatonin, Aleve, and hydrocodone.   ALLERGIES:  MONISTAT (causes swelling) SULFA.   SOCIAL HISTORY:  Divorced. Two children. Disabled. Nonsmoker. Drinks 5 liter  box of wine approximately weekly.   FAMILY HISTORY:  Grandmother with heart disease. Brother with alcoholism.  Aunt passed away from colon cancer in her 79's.   PHYSICAL EXAMINATION:  VITAL SIGNS:  5 foot 5 inches, 180 pounds. Blood  pressure 118/60, pulse 80.  CONSTITUTIONAL:  Sitting in a wheelchair. Hard to move due to arthritis  symptoms. Overall well appearing.  NEUROLOGIC:  Alert and oriented times three.  HEENT:  Eyes:  Extraocular movements intact. Mouth:  Oropharynx moist. No  lesions.  NECK:  Supple. No lymphadenopathy.  CARDIOVASCULAR:  Regular rate and rhythm.  LUNGS:  Clear to auscultation bilaterally.  ABDOMEN:  Soft, nontender, and nondistended. Normal bowel sounds.  EXTREMITIES:  No lower extremity edema.  SKIN:  No rash or lesion visible on extremities.   ASSESSMENT:  A 55 year old woman at increased risk for colorectal, stomach,  and esophageal cancer due to her underlying acromegaly.   PLAN:  I think it is very reasonable that we proceed with upper and lower  endoscopy at her soonest convenience. I see no reason for any further blood  tests or imaging studies prior to that. She does report colitis diagnosed  17 years ago but it is not clear that she really did have colitis, as she  was told this was due to underlying stress. This may just be irritable bowel  syndrome.  Rachael Fee, MD   DPJ/MedQ  DD:  06/19/2006  DT:  06/19/2006  Job #:  [id number]   cc:   Gregary Signs A. Everardo All, MD

## 2011-03-16 NOTE — Op Note (Signed)
NAMEMIRELA, [name]               ACCOUNT NO.:  [id number]   MEDICAL RECORD NO.:  [id number]          PATIENT TYPE:  AMB   LOCATION:  DSC                          FACILITY:  [facility]   PHYSICIAN:  Vanita Panda. Magnus Ivan, M.D.DATE OF BIRTH:  Aug 06, 1956   DATE OF PROCEDURE:  07/27/2004  DATE OF DISCHARGE:                                 OPERATIVE REPORT   PREOPERATIVE DIAGNOSIS:  Right carpal tunnel syndrome.   POSTOPERATIVE DIAGNOSIS:  Right carpal tunnel syndrome.   OPERATION PERFORMED:  Right open carpal tunnel release.   SURGEON:  Vanita Panda. Magnus Ivan, M.D.   ANESTHESIA:  General.   TOURNIQUET TIME:  23 minutes.   ESTIMATED BLOOD LOSS:  Minimal.   COMPLICATIONS:  None.   INDICATIONS FOR PROCEDURE:  [name] is a 55 year old female with clinical  signs of carpal tunnel syndrome.  This involved her bilateral upper  extremities.  She had peripheral nerve conduction studies that did show  moderate to severe carpal tunnel affecting both the upper extremities.  Three weeks ago she underwent successful left open carpal tunnel release and  now presents for her carpal tunnel release on the right side.  She has since  healed the incision on the left side and I thought it was safe to proceed  with surgery on the right side.   DESCRIPTION OF PROCEDURE:  After being brought to the operating room, she  was placed supine on the operating table and general anesthesia was  obtained.  Her right arm was then placed on an arm table and the tourniquet  was then placed around her upper arm.  Her arm was then prepped and draped  with DuraPrep and sterile drapes.  An Esmarch was used to wrap out the arm  and the tourniquet was inflated to 250 mmHg pressure.  Incision was then  made at the wrist just ulnar to the thenar crease.  This was taken down  sharply through the skin and then blunt dissection was carried out to reveal  the distal most margin of the carpal tunnel.  A Glorious Peach was then  placed  between the carpal tunnel and the median nerve and blunt tip scissors were  used to release the carpal ligament from distal to proximal.  The median  nerve was then identified and found to be in continuity as well as the  palmar arch.  The recurrent motor branch was not identified.  The wound was then copiously  irrigated and closed with interrupted 4-0 nylon sutures.  The sterile  dressings were then applied and the tourniquet was deflated.  The fingers  all did pinken nicely.  The patient was awakened, extubated and taken to  recovery room in stable condition.       CYB/MEDQ  D:  07/27/2004  T:  07/28/2004  Job:  [id number]

## 2011-03-16 NOTE — Op Note (Signed)
[name], [name]               ACCOUNT NO.:  [id number]   MEDICAL RECORD NO.:  [id number]          PATIENT TYPE:  INP   LOCATION:  0001                         FACILITY:  [facility]   PHYSICIAN:  Madlyn Frankel. Charlann Boxer, M.D.  DATE OF BIRTH:  [birth year]   DATE OF PROCEDURE:  05/10/2006  DATE OF DISCHARGE:                                 OPERATIVE REPORT   PREOPERATIVE DIAGNOSIS:  Left knee osteoarthritis.   POSTOPERATIVE DIAGNOSIS:  Left knee osteoarthritis.   PROCEDURE:  Left total knee replacement.   COMPONENTS USED:  DePuy rotating platform, posterior-stabilized knee system  with size 3 femur, size 3 tibia, a 10-mm posterior-stabilized rotating  platform insert and a 35 patellar button.   SURGEON:  Madlyn Frankel. Charlann Boxer, M.D.   ASSISTANT:  Cherly Beach, P.A.   ANESTHESIA:  General plus a regional femoral block.   TOURNIQUET TIME:  Sixty minutes a 250 mmHg.   DRAINS:  x1.   COMPLICATIONS:  None.   INDICATIONS FOR PROCEDURE:  [name] is a 55 year old female with a  history of diplegia cerebral palsy, who presented to the office for  evaluation of bilateral knee osteoarthritis.  She had a recent history of  right total knee replacement by myself that was complicated by retained  hardware.  She has done well with that procedure.   She presented for the left total knee replacement at this point.   The risks and benefits of the procedures were reviewed and I discussed at  this point that I would be proceeding with a posterior stabilized component  just as this is my more routine procedure versus this cruciate-retaining  component.  Consent was obtained.   PROCEDURE IN DETAIL:  The patient was brought to operative theater.  Once  adequate anesthesia was established, I did perform an exam under anesthesia  on the right knee; it revealed that she can near full extension, lacking  only a few degrees.  She did have flexion there was fairly easy, to at least  120 degrees if not 130  degrees.   No manipulation was necessary in order to maximize this motion.   Following this, attention was directed then to the left knee.  A tourniquet  was placed on the proximal left thigh.  The left lower extremity was then  prepped and draped in a sterile fashion.  A midline incision was made that  took in account her valgus position of the lower extremity.  A medial  parapatellar arthrotomy, modified, was then carried out, meaning that the  patella was not everted, it was a subluxated.  I did do a Trivector approach  just onto the lateral border of the VMO insertion.  The medial side was not  excessively stripped due to her valgus nature.   Once exposure was obtained, the intramedullary canal of the femur was opened  and irrigated to prevent fat emboli.  An intramedullary rod was positioned;  5 degrees of valgus were utilized and 10 mm of her bone resected off the  distal femur.   Following this, I sized the femur to be a size 3.  It was somewhat between a  3 and 4 in the sizing, so I cut and placed a trimmed block between a 3 and a  4.  The rotation was excellent for external rotation, matching the white  sideline.   Anterior, posterior and chamfer cuts were then made, excessive bone debrided  off the posterior aspect of the femur.  I then went ahead and cut the distal  aspect of the femur with the trochlear box based off the lateral aspect of  the distal femur.   Following this, attention was directed the tibia.   Tibial exposure was obtained and with retractor placement, meniscectomies  carried out.  I then resected 10 mm of bone off the lateral aspect of the  proximal tibia.  Though this was the area with most wear, based on her  radiograph, this was the high side.   Ten millimeters of bone were resected without complication in the  perpendicular plane, AP and laterally.   Once I made this cut, A sizing block was positioned in extension, noting  that the knee came to  full extension with this and I was pleased.   At this point, a trial reduction was carried out with a fixed-bearing  prosthesis on the tibia to mark rotations.  A size 3 femur was placed with a  3 tibia.  The knee was noted to come to full extension and was stable.  Rotation was marked.   At this point, attention was directed to the patella.  A precut patella  measurement was 22 mm.  I resected down to 14 mm.   A 35 patellar button was chosen, which matched the proximal and distal  diameter.  The patella was noted to track without application of thumb  pressure.  The knee was stable from extension to flexion.   At this point, the attention was now directed to the final component  positioning.  The trial components were removed.  The final tibial  preparation was carried out with a size 3 tibial tray using the rotation  marked with a fixed-bearing prosthesis.  Drilling and keel punch were  carried out.   The size 3 component fit nicely on the proximal cut surface.   At this point, the knee was irrigated with normal saline solution and pulse  lavage.  I injected the knee was 60 mL of Marcaine with epinephrine and  Toradol 1 mL, 30 mg.  Cement was mixed.  The components were then cemented  into position with the tibia, femur and patella.  Excessive cement was  debrided while the cement cured.  Once the cement had cured, again excessive  cement was debrided out the posterior aspect of the knee and I was very  happy with the range of motion in full extension and good flexion and then I  from there placed a final 10-mm poly.  At this point, the tourniquet was let  down and the knee was again irrigated.  I placed a medium Hemovac drain.  I  reapproximated the extensor mechanism in flexion and closed the remainder of  the wound in layers with 4-0 Monocryl on the skin.  The knee was then  dressed Steri-Strips, dressing sponges and a bulky dressing.  She was brought to the recovery room in  stable condition.      Madlyn Frankel Charlann Boxer, M.D.  Electronically Signed     MDO/MEDQ  D:  05/10/2006  T:  05/10/2006  Job:  [id number]

## 2011-03-16 NOTE — Discharge Summary (Signed)
[name], [name] NO.:  [id number]   MEDICAL RECORD NO.:  [id number]          PATIENT TYPE:  INP   LOCATION:  1515                         FACILITY:  [facility]   PHYSICIAN:  Madlyn Frankel. Charlann Boxer, M.D.  DATE OF BIRTH:  [birth year]   DATE OF ADMISSION:  05/10/2006  DATE OF DISCHARGE:  05/15/2006                                 DISCHARGE SUMMARY   ADMITTING DIAGNOSES:  1.  Degenerative arthritis of the left knee.  2.  Status post right total knee replacement arthroplasty.  3.  Diplegic cerebral palsy.  4.  Early diagnosed acromegalia.   DISCHARGE DIAGNOSES:  1.  Degenerative arthritis of the left knee.  2.  Status post right total knee replacement arthroplasty.  3.  Diplegic cerebral palsy.  4.  Early diagnosed acromegalia.  5.  Ruptured Baker's cyst left lower extremity.  6.  Mild postoperative anemia.   OPERATION:  On May 10, 2006, the patient underwent left total knee  replacement arthroplasty utilizing DePuy rotating platform with posterior  stabilized knee system.  Katherine Campus, PA-C, assisted.   BRIEF HISTORY:  This 55 year old lady with diplegic cerebral palsy was seen  in our office primarily for bilateral knee end stage osteoarthritis.  She  successfully underwent a right total knee replacement arthroplasty in the  not too distant past, had done very well with that procedure.  X-rays have  shown marked arthritis, deterioration into the left knee, and after much  consideration, including the risks and benefits of surgery and taking in  light of the fact that she did very well with her right knee, she agreed to,  and was scheduled for, left total knee replacement arthroplasty.   COURSE IN THE HOSPITAL:  The patient tolerated the surgical procedure quite  well.  She was quite familiar with total knee protocol and began with her  inpatient physical therapy.  The wound remained clean and dry.  She was  noted oo have some swelling and increased pain near  the left calf area.  We  ordered Dopplers to be sure there was no evidence of any deep venous  thrombosis.  A ruptured Baker's cyst was noted on Dopplers and it was  NEGATIVE for deep venous thrombosis.  TED hose was used, as well as  elevation on this left lower extremity.   As the patient progressed through her in-hospital rehabilitation, both knees  were ranged and worked on by the therapist.  This was due to the fact that  she had just recently had a right total knee replacement arthroplasty.   It was learned that the patient did not have help at home.  We therefore  arranged for skilled nursing facility with a good rehabilitation program.  Once this was chosen and bed was available, she was transferred.  On the day  of discharge patient had been ambulating in her room, weightbearing as  tolerated, minimal assist.  She was quite anxious to continue with her  therapy so she could return to her home independent and safe.   On the day of discharge the wound was clean and  dry.  Moderate amount of  swelling with 1-2+ edema in the left lower extremity (from a ruptured  Baker's cyst).  Neurovascular is intact to the left lower extremity.   Laboratory values in the hospital hematologically showed a CBC  preoperatively complete within normal limits.  Hemoglobin 13.5.  Hematocrit  was 40.2.  The final hemoglobin was 9.8 with a hematocrit of 29.6.  Blood  chemistries stayed normal.  Urinalysis negative for urinary tract infection.  Chest x-ray showed a nodular density in the right midlung periphery, not  seen on prior chest CT, with lingular atelectasis or scar.  The CT scan  showed no evidence of thoracic neoplasm and moderate centrilobular emphysema  with no acute chest findings.  Echocardiogram from May of this year showed  normal sinus rhythm.   CONDITION ON DISCHARGE:  Improved, stable.   PLAN:  The patient is transferred to skilled nursing facility, I believe  [facility] has been chosen,  to continue with rehabilitation.  Physical therapy  can continue with weightbearing as tolerated.  Range of motion both active  and passive to both lower extremities.  Dry dressing to the left knee on an  as-needed basis.  Recommend thigh-high or knee-high TED hose to the left  lower extremity, elevate the left lower extremity when she is up, emphasis  on a pillow under the ankle and lower leg to assist in extension of the  knee.   Return to see Dr. Charlann Boxer 2 to 2-1/2 weeks after the date of surgery.  His  office number is [phone].  Any medical questions should be addressed toward  Dr. Romero Belling at [address] [place], [place], [place] [address].   Medications at discharge are:  1.  Ferrous sulfate 1 p.o. b.i.d. next 30 days.  2.  Tylenol 325 mg 2 q.6h.  3.  Ecotrin 325 tab EC b.i.d.  4.  Skelaxin 800 mg tab q.h.s.  5.  Lipitor 40 mg tab q. 1800 hours.  6.  Enablex 15 mg tab daily.  7.  Neurontin 300 mg tab, 1200 mg b.i.d.  8.  Valium 2 mg tab t.i.d.  9.  Vitamin B with C daily.  10. Multi Vit daily.  11. Oxycodone 5 mg q.3h. p.r.n. breakthrough pain.  12. Robaxin 500 mg p.o. alternating with Skelaxin q.6h. p.r.n. pain.   Again, any other medical question needs to be directed toward Dr. Everardo All.      Dooley L. Cherlynn June.      Madlyn Frankel Charlann Boxer, M.D.  Electronically Signed    DLU/MEDQ  D:  05/15/2006  T:  05/15/2006  Job:  [id number]   cc:   Gregary Signs A. Everardo All, M.D. [facility]  [address]  [place]  [place] [address]   Madlyn Frankel. Charlann Boxer, M.D.  Fax: [phone]

## 2011-03-16 NOTE — Op Note (Signed)
[name], [name]               ACCOUNT NO.:  [id number]   MEDICAL RECORD NO.:  [id number]          PATIENT TYPE:  INP   LOCATION:  NA                           FACILITY:  [facility]   PHYSICIAN:  Madlyn Frankel. Charlann Boxer, M.D.  DATE OF BIRTH:  06/05/1956   DATE OF PROCEDURE:  03/12/2006  DATE OF DISCHARGE:                                 OPERATIVE REPORT   PRINCIPAL DIAGNOSIS:  Right knee osteoarthritis in a patient with history of  biplegic cerebral palsy, with a history of right femoral derotational  osteotomy status post hardware removal with a retained DCS screw within the  distal femur.   POSTOPERATIVE DIAGNOSIS:  Right knee osteoarthritis in a patient with  history of biplegic cerebral palsy, with a history of right femoral  derotational osteotomy status post hardware removal with a retained DCS  screw within the distal femur.   PROCEDURE:  Computer navigated right total knee replacement.   COMPONENTS USED:  The A DePuy Sigma knee cruciate retaining size 4 femur,  size 3 tibial rotating platform tibial tray with a 12.5 x 4 size 4  polyethylene insert anterior lipped, cruciate retaining and a 35 patella  button.   SURGEON:  Madlyn Frankel. Charlann Boxer, M.D.   ASSISTANTDruscilla Brownie. Cherlynn June.   ANESTHESIA:  General plus regional femoral block.   BLOOD LOSS:  100 mL.   DRAINS:  One.   TOURNIQUET TIME:  93 minutes at 250 mmHg.   COMPLICATIONS:  None.   INDICATIONS FOR PROCEDURE:  Ms. Wauneka is a pleasant 55 year old female who  was referred for surgical consideration for a history of advanced right knee  osteoarthritis that had failed conservative measures including knee  arthroscopies, injections and anti-inflammatory use.  Importantly, her  history was reviewed that revealed a history of cerebral palsy with a  history of right femoral rotational osteotomy.  The patient had had the  hardware removed which was a large DCS plate but the DCS screw had fractured  and retained.  Upon  initial review of the patient in her current situation,  there was discussed about removing the hardware as a separate procedure  versus cruciate retaining procedure utilizing the computer to avoid  intramedullary penetration and removal of the screw.  Risks and benefits of  all potential options were discussed including standard risks of knee  replacement risk including DVT, infection, need for revision surgery,  component failure, persistent discomfort.   Consent was obtained for computer navigated cruciate retaining knee.   PROCEDURE IN DETAIL:  The patient was brought to the procedure.  Once  adequate anesthesia and preoperative antibiotics, 1 gram of Ancef, were  administered, the patient was positioned supine on the operating room table.  Proximal thigh tourniquet was placed.  This was placed tight enough to allow  for femoral pins for arrays.  Following this, the right lower extremity was  prepped and draped in sterile fashion following a prescrub.  The patient was  noted to have, on examination under anesthesia, 5 to 10 degree flexion  contracture.  Most of her wear appeared to be lateral.  Once the leg was prepped and draped, the femoral and tibial pins were placed  without complication for placement of the arrays.  At this point, the right  lower extremity was exsanguinated and tourniquet elevated to 250 mmHg.   A midline incision was made followed by a modified medial parapatellar  arthrotomy in the trivector plane and a VMO.  Exposure was obtained to allow  for patella subluxation as opposed to eversion.  Following exposure  including meniscectomy, attention was now directed to setting the computer  up.  The arrays were placed onto the tibial and femoral pins and landmarks  identified in order of reference per the computer.  Once landmark  referencing had been obtained including the hip center, we determined by the  computer standpoint, the patient had a 7 degree flexion  contracture and 10  degrees of valgus.   At this point, attention was directed to the femur.  Based on the flexion  contracture both clinically and by the computer, I chose to resect 10.7 mm  of bone off the distal femur.  This was done in an anatomic line, neutral to  the mechanical axis with the use of the computer.  Following this, I did  place the distal sizer onto the distal femur based on the posterior condyles  and went ahead and marked these holes.   I did reference the rotation of the femur based off the posterior condyles  which amounted to 3 degrees of external rotation.  There was a 1 degree  difference between white sideline epicondylar axis.  For that reason, I  chose it.  It matched perfectly from the sizing jig. I did size the femur be  a size 4.  The computer had sized it to be a size 5.  Anterior, posterior  and chamfer cuts were made without complication.  There was no notch and it  appeared to be perfect.   Following these cuts, attention was now directed to the tibia.   Tibial exposure was obtained including further meniscectomies with  protection against the posterior cruciate ligament.   Utilizing the computer guide with reference, I chose to resect initially 10  mm of bone off of high side.  This only amounted to a 9 mm cut laterally  which corresponded with radiographic appearance of symmetry.  Following  placement of pins and to make sure the correct rotation and neutral cut with  2 degrees of posterior slope this cut was made.  Again, protection against  the cruciate ligament was carried out.   Initial attempt at trial reduction felt that the middle size 10 polyethylene  insert that the knee was tight with a 8 degree flexion contracture.  For  this reason, I went ahead and cut 2 more millimeters of bone, making sure  that the plane of the cut was perpendicular based on the computer.  At this point, I put the trials back in with the size 4 femur, a size 3  tibia with  posterior cruciate retaining fixed bearing prosthesis was utilized for the  trial.  A 10 cm insert was placed.  At this point, the knee easily came out  to extension.  Also checked alignment and found it passed directed through  the center of the talus.   At this point with a trial components in position and reassessing the  computer alignment, revealed a slight tendency to flexion contracture though  it appeared stable with good gaps in  extension and flexion. I went  ahead  and cut the patella at this point with precut measurement of 22 mm,  resecting down to 15 and placing a 35 patellar button.  The patella tracked  without application of pressure.  No evidence of lift off laterally.   At this point, attention was now directed to final preparation of the  components with the tibial rotation marked based on the fixed bearing  prosthesis and tibial component.  I went ahead and fixed the tibia with keel  punching with a size 3 rotating platform bearing as this fit best compared  to a size 4, there was no overhang and good allowance for rotation.  Femoral  lug holes were drilled with position of the femur as it was based on the  lateral aspect of distal femur.   At this point, the final components were brought into the field holding the  polyethylene.  Final irrigation of the knee was carried out in preparation  for cementing.  The knee was injected at this point with 61 mL of Marcaine  with epinephrine and 1 mL of Toradol.   Once the cement was ready, the tibial component cemented in position first  followed by the femur.  The knee was brought to extension with 10 mm spacer.  Patellar bone was cemented and positioned.   It was during this trial and allowing the cement to cure that the knee  easily came to extension and was able to be hyperextended some.  This was  clinical evident as well.  For this reason, I went ahead and trialed a 12.5  poly.  With this, the knee came  out to full extension clinically as well as  to the computer with only about 1 degree flexion contracture and 3 degrees  of valgus based on the computer.   Given these parameters, I went ahead and chose the 12.5 poly as this seemed  to stabilize the knee better in both extension and flexion as well as  prevent any hyperextension.   Once the cement had cured, excessive cement was debrided from the knee the  final 12.4 x 4 polyethylene rotating platform insert was then inserted into  the tibial tray and the knee reduced.  Again the knee was irrigated.  Re-  examination of the knee for any loose fragments of cement was carried out.  Medium Hemovac drain was placed deep.   The extensor mechanism was reapproximated using #1 Vicryl with the knee in  flexion.  The remainder of the wound was closed in layers with a 4-0  Monocryl in the  skin.  The patient was then extubated and brought to  recovery in stable condition.     Madlyn Frankel Charlann Boxer, M.D.  Electronically Signed     MDO/MEDQ  D:  03/12/2006  T:  03/12/2006  Job:  [id number]

## 2011-03-16 NOTE — Discharge Summary (Signed)
NAMETIMIYAH, ROMITO NO.:  [id number]   MEDICAL RECORD NO.:  [id number]          PATIENT TYPE:  INP   LOCATION:  1504                         FACILITY:  [facility]   PHYSICIAN:  Madlyn Frankel. Charlann Boxer, M.D.  DATE OF BIRTH:  1955-11-08   DATE OF ADMISSION:  03/12/2006  DATE OF DISCHARGE:  03/16/2006                                 DISCHARGE SUMMARY   ADMISSION DIAGNOSIS:  1.  Right knee degenerative joint disease.  2.  Retained hardware right knee.  3.  Diplegic cerebral palsy.  4.  Early diagnosis of acromegaly.   DISCHARGE DIAGNOSES:  1.  Right knee degenerative joint disease.  2.  Retained hardware right knee.  3.  Diplegic cerebral palsy.  4.  Early diagnosis of acromegaly  5.  Mild postoperative anemia.   PROCEDURE:  On 03/12/06, the patient underwent computer-navigated right  total knee replacement arthroplasty, D.L. Idolina Primer PA-C assisted.   BRIEF HISTORY:  This 55 year old lady had advanced right knee  osteoarthritis.  Conservative measures including knee arthroscopies,  injections, anti-inflammatories have not helped her.  Her history of  cerebral palsy showed some right femoral rotation and resulted in right  rotational osteotomy.  She had the hardware removed from the leg, but a DC  screw fracture was retained.  After risks and benefits of surgery were  described to the patient, she decided to go ahead with total knee  replacement arthroplasty.  During the surgery, it was decided that we did  not have to remove the fractured screw that was retained, so we left it and  it did not interfere with her total knee.  The patient is entered into total  knee protocol postoperatively and was very eager to begin her therapy.  She  was up and ambulating in the hall, doing quite well.  Neurovascular intact  at the operative extremity.  Wound was clean and dry.  The patient was  placed on aspirin postoperatively and TED hose for DVT prophylaxis.  Hemoglobin remained  stable.  Laboratory values in the hospital  hematologically showed a CBC with differential done preoperatively  completely within normal limits.  Final hemoglobin was 10.3, hematocrit  30.5.  Blood chemistries were normal.  Urinalysis negative for urinary tract  infection.  Electrocardiogram showed normal sinus rhythm, no chest x-ray  seen on this chart.   CONDITION ON DISCHARGE:  Improved, stable.   PLAN:  The patient discharged to her home.  Vicodin given for discomfort,  Robaxin as a muscle relaxant, enteric coated aspirin 1 by mouth twice a day  x 2 weeks, and then 1 daily for 2 weeks.  She may return to resume her  Voltaren 75 mg by twice a day.  We will be doing her left total knee in the  not too distant future.  She should follow up with Dr. Romero Belling per his  instructions.      Dooley L. Cherlynn June.      Madlyn Frankel Charlann Boxer, M.D.  Electronically Signed    DLU/MEDQ  D:  04/01/2006  T:  04/02/2006  Job:  [id number]  cc:   Madlyn Frankel. Charlann Boxer, M.D.  Fax: [phone]   Sean A. Everardo All, M.D. [facility]  [address]  [place]  [place] [address]

## 2011-03-16 NOTE — H&P (Signed)
[name], [name]               ACCOUNT NO.:  [id number]   MEDICAL RECORD NO.:  [id number]          PATIENT TYPE:  INP   LOCATION:  NA                           FACILITY[facility]   PHYSICIAN:  Madlyn Frankel. Charlann Boxer, M.D.  DATE OF BIRTH:  10-Dec-1955   DATE OF ADMISSION:  05/10/2006  DATE OF DISCHARGE:                                HISTORY & PHYSICAL   CHIEF COMPLAINT:  Pain in my left knee.   PRESENT ILLNESS:  A 55 year old lady with history of cerebral palsy, is very  functional with her diplegic cerebral palsy.  She has successfully undergone  right total knee replacement arthroplasty in the not too distant past and  now presents for left total knee replacement arthroplasty.  The right knee  was done in May of this year.  She has done very well with that surgery but  is having continuing pains into the left knee with end-stage osteoarthritis.  She is very familiar with the parameters and the risks and benefits of  surgery.  Those were reviewed, and it was decided to go ahead with total  knee replacement arthroplasty of the left knee.   The only difference this time is the patient will not have constant home  care, will not be able to go to her mother's house as she did before, so we  will need to work with discharge planning to be sure that we have a safe  environment for her postoperative/post-hospitalization course.  She may even  need skilled nursing.  She essentially will be alone after her  hospitalization.   PAST MEDICAL HISTORY:  Her physician is Dr. Everardo All.  She has a history of  migraines, anxiety depression and pneumonia as a child.  She has ulcerative  colitis and hemorrhoids.  She is apparently being worked up for possible  acromegalia.  Past surgeries include a right total knee replacement in May  2007, carpal tunnel release in September 2005.   ALLERGIES:  SHE IS ALLERGIC TO SULFA WHICH CAUSES A RASH.  MONISTAT CAUSES  BURNING.   CURRENT MEDICATIONS:  The patient  did not bring her medications with her nor  did she bring a list.  I told her to take it to the preadmitting visit so  they can list it in the record.   FAMILY HISTORY:  Positive for heart disease, hypertension, diabetes.   SOCIAL HISTORY:  The patient is divorced, disabled.  No intake of tobacco  products.  About one liter of wine per day.  She has two children.   REVIEW OF SYSTEMS:  CNS:  No seizure disorder, paralysis, double vision.  The patient does have generalized weakness consistent with her mild CP.  CARDIOVASCULAR:  No chest pain, no angina or orthopnea.  RESPIRATORY:  No  productive cough, no hemoptysis, no shortness of breath.  GASTROINTESTINAL:  No nausea, vomiting or blood stool.  GENITOURINARY:  The patient has some  mild incontinence with urinary frequency and some urgency.  She also has  nocturia.  MUSCULOSKELETAL:  Primarily in present illness.   PHYSICAL EXAMINATION:  Alert, cooperative, friendly, 55 year old white  female who is seen in a wheelchair.  She usually walks with a walker.  VITAL SIGNS:  Blood pressure 150/80, pulse 72, respirations 12.  HEENT:  Normocephalic.  PERRLA, EOM intact.  Oropharynx is clear.  CHEST:  Clear to auscultation.  No rhonchi, no rales.  HEART:  Regular rate and rhythm.  No murmurs are heard.  ABDOMEN:  Soft, nontender.  Liver and spleen not felt.  GENITALIA, RECTAL, PELVIC, BREASTS:  Not done.  Not pertinent to present  illness.  EXTREMITIES:  Left knee with painful range of motion, some mild crepitus.   ADMITTING DIAGNOSES:  1.  Degenerative arthritis of the left knee.  2.  Status post right total knee replacement arthroplasty.  3.  Diplegic cerebral palsy.  4.  Early diagnosed acromegalia.   PLAN:  The patient will be admitted for total knee replacement arthroplasty  of the left knee.  Should we have any medical problems, we will certainly  call Dr. Everardo All or one of his associates to follow along with [place] during her   hospitalization.  Again as mentioned above, we will need to be quite  diligent as far as her post-hospital plans as she will essentially have no  help on an outpatient basis.  Hopefully, we can get her in the  rehabilitation program at [facility].  If not, she will need skilled  nursing with rehab facilities.      [name].      Madlyn Frankel Charlann Boxer, M.D.  Electronically Signed    DLU/MEDQ  D:  05/02/2006  T:  05/02/2006  Job:  [id number]   cc:   Gregary Signs A. Everardo All, M.D. [facility]  [address]  [place]  [place] [address]

## 2011-03-30 ENCOUNTER — Encounter (HOSPITAL_BASED_OUTPATIENT_CLINIC_OR_DEPARTMENT_OTHER): Payer: Medicare Other | Admitting: Licensed Clinical Social Worker

## 2011-03-30 DIAGNOSIS — F329 Major depressive disorder, single episode, unspecified: Secondary | ICD-10-CM

## 2011-04-11 ENCOUNTER — Encounter (HOSPITAL_BASED_OUTPATIENT_CLINIC_OR_DEPARTMENT_OTHER): Payer: Medicare Other | Admitting: Licensed Clinical Social Worker

## 2011-04-11 DIAGNOSIS — F329 Major depressive disorder, single episode, unspecified: Secondary | ICD-10-CM

## 2011-04-25 ENCOUNTER — Encounter (HOSPITAL_BASED_OUTPATIENT_CLINIC_OR_DEPARTMENT_OTHER): Payer: Medicare Other | Admitting: Licensed Clinical Social Worker

## 2011-04-25 DIAGNOSIS — F329 Major depressive disorder, single episode, unspecified: Secondary | ICD-10-CM

## 2011-05-09 ENCOUNTER — Encounter (HOSPITAL_BASED_OUTPATIENT_CLINIC_OR_DEPARTMENT_OTHER): Payer: Medicare Other | Admitting: Licensed Clinical Social Worker

## 2011-05-09 DIAGNOSIS — F329 Major depressive disorder, single episode, unspecified: Secondary | ICD-10-CM

## 2011-05-23 ENCOUNTER — Encounter (INDEPENDENT_AMBULATORY_CARE_PROVIDER_SITE_OTHER): Payer: Medicare Other | Admitting: Psychiatry

## 2011-05-23 DIAGNOSIS — F3189 Other bipolar disorder: Secondary | ICD-10-CM

## 2011-05-25 ENCOUNTER — Encounter (HOSPITAL_BASED_OUTPATIENT_CLINIC_OR_DEPARTMENT_OTHER): Payer: Medicare Other | Admitting: Licensed Clinical Social Worker

## 2011-05-25 DIAGNOSIS — F319 Bipolar disorder, unspecified: Secondary | ICD-10-CM

## 2011-06-06 ENCOUNTER — Encounter (HOSPITAL_COMMUNITY): Payer: Medicare Other | Admitting: Licensed Clinical Social Worker

## 2011-06-20 ENCOUNTER — Encounter (INDEPENDENT_AMBULATORY_CARE_PROVIDER_SITE_OTHER): Payer: Medicare Other | Admitting: Licensed Clinical Social Worker

## 2011-06-20 DIAGNOSIS — F259 Schizoaffective disorder, unspecified: Secondary | ICD-10-CM

## 2011-06-20 DIAGNOSIS — F319 Bipolar disorder, unspecified: Secondary | ICD-10-CM

## 2011-06-25 ENCOUNTER — Encounter: Payer: Self-pay | Admitting: Gastroenterology

## 2011-07-04 ENCOUNTER — Encounter (INDEPENDENT_AMBULATORY_CARE_PROVIDER_SITE_OTHER): Payer: Medicare Other | Admitting: Licensed Clinical Social Worker

## 2011-07-04 DIAGNOSIS — F319 Bipolar disorder, unspecified: Secondary | ICD-10-CM

## 2011-07-27 ENCOUNTER — Encounter (HOSPITAL_BASED_OUTPATIENT_CLINIC_OR_DEPARTMENT_OTHER): Payer: Medicare Other | Admitting: Licensed Clinical Social Worker

## 2011-07-27 DIAGNOSIS — F319 Bipolar disorder, unspecified: Secondary | ICD-10-CM

## 2011-08-07 ENCOUNTER — Other Ambulatory Visit: Payer: Self-pay | Admitting: Family Medicine

## 2011-08-08 ENCOUNTER — Encounter (INDEPENDENT_AMBULATORY_CARE_PROVIDER_SITE_OTHER): Payer: Medicare Other | Admitting: Licensed Clinical Social Worker

## 2011-08-08 DIAGNOSIS — F319 Bipolar disorder, unspecified: Secondary | ICD-10-CM

## 2011-08-09 ENCOUNTER — Ambulatory Visit
Admission: RE | Admit: 2011-08-09 | Discharge: 2011-08-09 | Disposition: A | Discharge status: Home / Self Care | Payer: Medicare Other | Source: Ambulatory Visit | Attending: Family Medicine | Admitting: Family Medicine

## 2011-08-22 ENCOUNTER — Encounter (INDEPENDENT_AMBULATORY_CARE_PROVIDER_SITE_OTHER): Payer: Medicare Other | Admitting: Psychiatry

## 2011-08-22 DIAGNOSIS — F3189 Other bipolar disorder: Secondary | ICD-10-CM

## 2011-08-28 ENCOUNTER — Encounter (INDEPENDENT_AMBULATORY_CARE_PROVIDER_SITE_OTHER): Payer: Medicare Other | Admitting: Licensed Clinical Social Worker

## 2011-08-28 DIAGNOSIS — F319 Bipolar disorder, unspecified: Secondary | ICD-10-CM

## 2011-09-05 ENCOUNTER — Encounter (HOSPITAL_COMMUNITY): Payer: Medicare Other | Admitting: Licensed Clinical Social Worker

## 2011-11-07 ENCOUNTER — Ambulatory Visit (HOSPITAL_COMMUNITY): Payer: Medicare Other | Admitting: Licensed Clinical Social Worker

## 2011-11-16 ENCOUNTER — Ambulatory Visit (INDEPENDENT_AMBULATORY_CARE_PROVIDER_SITE_OTHER): Payer: Medicare Other | Admitting: Psychiatry

## 2011-11-16 ENCOUNTER — Other Ambulatory Visit (HOSPITAL_COMMUNITY): Payer: Self-pay | Admitting: Psychiatry

## 2011-11-16 DIAGNOSIS — F319 Bipolar disorder, unspecified: Secondary | ICD-10-CM

## 2011-11-16 MED ORDER — ATOMOXETINE HCL 40 MG PO CAPS: 40.0000 mg | ORAL_CAPSULE | Freq: Every day | ORAL | Status: DC

## 2011-11-16 MED ORDER — AMITRIPTYLINE HCL 50 MG PO TABS: 50.0000 mg | ORAL_TABLET | Freq: Every day | ORAL | Status: DC

## 2011-11-16 MED ORDER — LAMOTRIGINE 150 MG PO TABS: 150.0000 mg | ORAL_TABLET | Freq: Every day | ORAL | Status: DC

## 2011-11-16 NOTE — Progress Notes (Signed)
Patient came for her followup appointment. 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 Patient likes her job and she is more busy and active in her daily life. She is sleeping good. She continues to have issues with her daughter but they're less intense and less frequent. She had a very good Christmas and she visited her mother in [place]. She denies any agitation anger mood swings. She has seen recently her primary care physician Dr. Anna Genre who has a started some medication but she does not remember the name and dosage of medication. On her last visit we have reduce her Strattera to 60 mg as patient having high liver enzymes. Patient to have another blood work however the results were not available but patient told there is still high. She denies any agitation anger or mood swings she is able to focus and concentrate.  Mental status examination Patient is pleasant calm cooperative. Her speech is soft clear and coherent. She described her mood is anxious and her affect is mood congruent. She denies any active or passive suicidal thoughts or homicidal thoughts. There no psychotic symptoms present. She's alert and oriented x3. Her insight judgment and pulse control is okay.  Assessment Bipolar disorder NOS ADD by history  Plan At this time I do believe patient is doing very well on Lamictal and Elavil. Since we do not have recent liver enzymes results I have recommended to further cut down her Strattera to 40 mg. I have requested percent recent blood work to [place]. I recommended to call us if she has any question or concern about the medication. The patient continued to do very well with reduced dose of Strattera we will consider completely taking her off on next visit. I will see her again in 4 weeks

## 2011-11-21 ENCOUNTER — Encounter (HOSPITAL_COMMUNITY): Payer: Self-pay | Admitting: Licensed Clinical Social Worker

## 2011-11-21 ENCOUNTER — Ambulatory Visit (INDEPENDENT_AMBULATORY_CARE_PROVIDER_SITE_OTHER): Payer: Medicare Other | Admitting: Licensed Clinical Social Worker

## 2011-11-21 DIAGNOSIS — F319 Bipolar disorder, unspecified: Secondary | ICD-10-CM

## 2011-11-21 NOTE — Progress Notes (Signed)
   THERAPIST PROGRESS NOTE  Session Time: 2:00pm-2:50pm  Participation Level: Active  Behavioral Response: Well GroomedAlertEuthymic  Type of Therapy: Individual Therapy  Treatment Goals addressed: Coping  Interventions: CBT, Solution Focused, Strength-based and Supportive  Summary: [name] is a 56 y.o. female who presents with euthymic mood and bright affect. This is her first session in two months since this therapist has been out on leave. She reports doing well and is proud to report that she got a job. She endorses feeling much happier since she was hired, with improved self esteem and self worth. She continues to have anxiety about both her children, particularly her daughter, who is now being treated for severe depression. Patient is fearful that her daughter will try to kill herself again. She checks in on her multiple times a day to make sure she is okay. She remains angry with her son, who has destroyed some of her property. She is doing well upholing her boundaries and set firmer ones with her boyfriend. Her sleep and appetite are wnl.    Suicidal/Homicidal: Nowithout intent/plan  Therapist Response: Overall, patient has done very well while this therapist has been out on leave. Discussed the need to continue therapy since patient has been maintaining well. She does not want to stop entirely as she is concerned that something will go wrong and she will need help navigating. Will space out the sessions and will see her in one month to reassess. Assisted patient with the expression of her feelings of frustration and anxiety. Reviewed patients self care plan. Assessed her progress related to self care. Patient's self care is fair. Recommend proper diet, regular exercise, socialization and recreation. Used motivational interviewing to assist and encourage patient through the change process. Explored patients barriers to change.   Plan: Return again in four  weeks.  Diagnosis: Axis I: Bipolar, Depressed    Axis II: Deferred    Otila Starn, LCSW 11/21/2011

## 2011-12-13 ENCOUNTER — Ambulatory Visit (HOSPITAL_COMMUNITY): Payer: Medicare Other | Admitting: Licensed Clinical Social Worker

## 2011-12-14 ENCOUNTER — Ambulatory Visit (INDEPENDENT_AMBULATORY_CARE_PROVIDER_SITE_OTHER): Payer: Self-pay | Admitting: Licensed Clinical Social Worker

## 2011-12-14 DIAGNOSIS — F319 Bipolar disorder, unspecified: Secondary | ICD-10-CM

## 2011-12-14 NOTE — Progress Notes (Signed)
   THERAPIST PROGRESS NOTE  Session Time: 2:00pm-2:50pm  Participation Level: Active  Behavioral Response: Well GroomedAlertEuthymic  Type of Therapy: Individual Therapy  Treatment Goals addressed: Coping  Interventions: Motivational Interviewing and Supportive  Summary: [name] is a 56 y.o. female who presents with euthymic mood and bright affect. She reports doing very well since she got a job. She enjoys the work, enjoys getting paid and having something to do with a purpose. Her mood is significantly improved and she understands that being out of the house and around others is healthy for her. She is happy that her son got a job and that her daughter is going to counseling and has started medication. She has decreased irritability with her boyfriend. Her sleep and appetite are wnl. She has not walked and is afraid it is too late because her legs are weak.    Suicidal/Homicidal: Nowithout intent/plan  Therapist Response: Patient is doing very well and demonstrates a positive attitude about her life. She is hopeful about the future and eager to go to work. Processed w/ patient her stress over her student loans coming due and her plan. Processed w/pt her lack of motivation to practice walking and her fear and avoidance of the pain she will feel when she starts. Used motivational interviewing to assist and encourage patient through the change process. Explored patients barriers to change. Reviewed patients self care plan. Assessed her progress related to self care. Patient's self care is fair. Recommend proper diet, regular exercise, socialization and recreation.   Plan: Return again in four weeks.  Diagnosis: Axis I: Bi polar disorder    Axis II: No diagnosis    Viridiana Spaid, LCSW 12/14/2011

## 2011-12-19 ENCOUNTER — Ambulatory Visit (HOSPITAL_COMMUNITY): Payer: Medicare Other | Admitting: Licensed Clinical Social Worker

## 2012-01-08 ENCOUNTER — Ambulatory Visit (INDEPENDENT_AMBULATORY_CARE_PROVIDER_SITE_OTHER): Payer: Self-pay | Admitting: Licensed Clinical Social Worker

## 2012-01-08 ENCOUNTER — Encounter (HOSPITAL_COMMUNITY): Payer: Self-pay | Admitting: *Deleted

## 2012-01-08 ENCOUNTER — Encounter (HOSPITAL_COMMUNITY): Payer: Self-pay | Admitting: Licensed Clinical Social Worker

## 2012-01-08 DIAGNOSIS — F319 Bipolar disorder, unspecified: Secondary | ICD-10-CM

## 2012-01-08 NOTE — Progress Notes (Signed)
   THERAPIST PROGRESS NOTE  Session Time: 2:00pm-2:50pm  Participation Level: Active  Behavioral Response: Well GroomedAlertAnxious and Irritable  Type of Therapy: Individual Therapy  Treatment Goals addressed: Anger, Anxiety and Coping  Interventions: CBT, Strength-based, Supportive and Reframing  Summary: [name] is a 56 y.o. female who presents with anxious mood and irritable affect. She is agitated throughout the session because her good friend who works with her has caused problems for patient at work. She feels stuck in the middle of conflict because her friend quit in an impulsive act and the boss will not allow her to return. Patient is anxious and defensive and does not want her friend to cause her any problems with her job. She is fearful of loosing her job and told her friend that if she causes this to happen, she will not be friends with her again. She is also angry that she received another phone call from the woman she suspects her boyfriend is cheating on her with. She wants to call the police and file a harassment charge. She loves her job and it continues to be a positive experience for her.  Her sleep and appetite are wnl,   Suicidal/Homicidal: Nowithout intent/plan  Therapist Response: Processed w/pt her anger and frustration. Encouraged patient to express her feelings and to explore strategies to manage her agitation. Used DBT to help patient build distress tolerance, create distraction ideas and communicate her feelings more effectively. Used CBT to assist patient with the identification of her negative distortions and irrational thoughts. Encouraged patient to verbalize alternative and factual responses which challenge her distortions. Reviewed patients self care plan. Assessed her progress related to self care. Patient's self care is good. Recommend proper diet, regular exercise, socialization and recreation.   Plan: Return again in four weeks.  Diagnosis: Axis  I: bi polar    Axis II: No diagnosis    Laylanie Kruczek, LCSW 01/08/2012

## 2012-01-08 NOTE — Progress Notes (Signed)
[employer] has authorized Strattera 40 mg as of 01/07/12. Duration of authorization not given. Pharmacy and patient notified 01/08/12 at [phone]

## 2012-01-14 ENCOUNTER — Ambulatory Visit (HOSPITAL_COMMUNITY): Payer: Medicare Other | Admitting: Psychiatry

## 2012-01-30 ENCOUNTER — Encounter (HOSPITAL_COMMUNITY): Payer: Self-pay | Admitting: Psychiatry

## 2012-01-30 ENCOUNTER — Ambulatory Visit (HOSPITAL_COMMUNITY): Payer: Medicare Other | Admitting: Psychiatry

## 2012-01-30 ENCOUNTER — Ambulatory Visit (INDEPENDENT_AMBULATORY_CARE_PROVIDER_SITE_OTHER): Payer: Medicare Other | Admitting: Psychiatry

## 2012-01-30 DIAGNOSIS — F319 Bipolar disorder, unspecified: Secondary | ICD-10-CM

## 2012-01-30 MED ORDER — LAMOTRIGINE 150 MG PO TABS: 150.0000 mg | ORAL_TABLET | Freq: Every day | ORAL | Status: DC

## 2012-01-30 MED ORDER — AMITRIPTYLINE HCL 50 MG PO TABS: 50.0000 mg | ORAL_TABLET | Freq: Every day | ORAL | Status: DC

## 2012-01-30 MED ORDER — ATOMOXETINE HCL 40 MG PO CAPS
40.0000 mg | ORAL_CAPSULE | Freq: Every day | ORAL | Status: DC
Start: 2012-01-30 — End: 2012-03-28

## 2012-01-30 NOTE — Progress Notes (Signed)
Chief complaint Medication management and followup.  History of present illness Patient is 56 year old Caucasian female who came for her followup appointment.  She's stable on her psychiatric medication.  She is very happy that her daughter also taking psychiatric medication and doing very well .  Patient denies any recent agitation anger or mood swings.  She endorse sometime frustration and irritability but overall her mood has been stable .  She sleeps 6-7 hours without any problem.  She is seeing therapist however recently she does not require to see her every 2 weeks .  She has some issues with her daughter but they are less intense and less frequent.  She is seeing primary care physician for management of diabetes and high cholesterol.  She did not remember the details of her medication and dosage but agreed to bring list of medication and recent or past on her next appointment.  Current psychiatric medication Strattera 40 mg daily Amitriptyline 50 mg at bedtime Lamictal 150 mg daily   Medical history Hyperlipidemia, cerebral palsy, borderline diabetes , acromegaly and irritable bowel syndrome .    Mental status examination Patient is casually dressed and well groomed.  She uses electronic wheelchair for ambulation .  She is pleasant calm and cooperative. Her speech is soft clear and coherent. She described her mood is anxious and her affect is mood congruent. She denies any active or passive suicidal thoughts or homicidal thoughts.  She denies any auditory or visual hallucination.  Her attention and concentration is good .  There no psychotic symptoms present. She's alert and oriented x3. Her insight judgment and pulse control is okay.  Assessment Axis I Bipolar disorder NOS, ADD by history Axis II deferred Axis III see medical history Axis IV mild to moderate Axis V 60-65  Plan At this time I do believe patient is doing well on Lamictal and Elavil.  One more time I encouraged her to  have recent blood work faxed to [place] .  I also recommend to bring list of medication on her next followup appointment.  I explained risks and benefits of medication in detail.  She will continue to see therapist regularly.  I will see her again in 2 months.

## 2012-01-31 ENCOUNTER — Other Ambulatory Visit (HOSPITAL_COMMUNITY): Payer: Self-pay

## 2012-01-31 ENCOUNTER — Telehealth (HOSPITAL_COMMUNITY): Payer: Self-pay

## 2012-01-31 ENCOUNTER — Other Ambulatory Visit (HOSPITAL_COMMUNITY): Payer: Self-pay | Admitting: Psychiatry

## 2012-01-31 DIAGNOSIS — F319 Bipolar disorder, unspecified: Secondary | ICD-10-CM

## 2012-01-31 MED ORDER — AMITRIPTYLINE HCL 50 MG PO TABS: 50.0000 mg | ORAL_TABLET | Freq: Every day | ORAL | Status: DC

## 2012-01-31 MED ORDER — LAMOTRIGINE 150 MG PO TABS: 150.0000 mg | ORAL_TABLET | Freq: Every day | ORAL | Status: DC

## 2012-01-31 NOTE — Telephone Encounter (Signed)
Request for 90 day slupply of Lamotrigine and Amitriptyline received from [facility] pharmacy. Last office visit 01/30/12.

## 2012-01-31 NOTE — Telephone Encounter (Signed)
Received faxed request from

## 2012-02-08 ENCOUNTER — Ambulatory Visit (INDEPENDENT_AMBULATORY_CARE_PROVIDER_SITE_OTHER): Payer: Medicare Other | Admitting: Licensed Clinical Social Worker

## 2012-02-08 DIAGNOSIS — F319 Bipolar disorder, unspecified: Secondary | ICD-10-CM

## 2012-02-08 NOTE — Progress Notes (Signed)
   THERAPIST PROGRESS NOTE  Session Time: 2:00pm-2:50pm  Participation Level: Active  Behavioral Response: Well GroomedAlertEuthymic  Type of Therapy: Individual Therapy  Treatment Goals addressed: Coping  Interventions: Motivational Interviewing, Strength-based and Supportive  Summary: [name] is a 56 y.o. female who presents with euthymic mood and bright affect. She reports doing well, with some frustration towards her boyfriend and her son. She got into a big argument with her boyfriend and describes confronting him of keeping items that other women have given him, while he has cheated. She was able to communicate her concerns and move on. Her work hours have decreased and she is disappointed by this, but hopeful that they will increase again. Overall, she is doing very well, except she is not walking. She says she wants to walk, but doesn't have time. Her sleep and appetite are wnl.   Suicidal/Homicidal: Nowithout intent/plan  Therapist Response: Reviewed patients progress and assessed her current functioning. Explored her motivation to walk and challenged her statement supporting her desire to walk verses her action of avoidance of it. Patient has made signficant progress and has sustaned this progress for a long period of time. Talked about discharge which patient agrees with. Explored her feelings related to terminating therapy and reviewed what she has learned. Recommend patient return if she needs to in the future. She will continue to be seen by Dr. Lolly Mustache for medication management.   Plan: Patient is discharged from therapy. She has completed her care.   Diagnosis: Axis I: bi polra    Axis II: No diagnosis    Alexanderjames Berg, LCSW 02/08/2012

## 2012-03-06 ENCOUNTER — Encounter: Payer: Self-pay | Admitting: Gastroenterology

## 2012-03-28 ENCOUNTER — Other Ambulatory Visit (HOSPITAL_COMMUNITY): Payer: Self-pay | Admitting: Psychology

## 2012-03-28 DIAGNOSIS — F319 Bipolar disorder, unspecified: Secondary | ICD-10-CM

## 2012-03-28 MED ORDER — ATOMOXETINE HCL 40 MG PO CAPS: 40.0000 mg | ORAL_CAPSULE | Freq: Every day | ORAL | Status: DC

## 2012-03-31 ENCOUNTER — Ambulatory Visit (INDEPENDENT_AMBULATORY_CARE_PROVIDER_SITE_OTHER): Payer: Medicare Other | Admitting: Psychiatry

## 2012-03-31 ENCOUNTER — Encounter (HOSPITAL_COMMUNITY): Payer: Self-pay | Admitting: Psychiatry

## 2012-03-31 VITALS — BP 126/86 | HR 100

## 2012-03-31 DIAGNOSIS — F319 Bipolar disorder, unspecified: Secondary | ICD-10-CM

## 2012-03-31 MED ORDER — AMITRIPTYLINE HCL 75 MG PO TABS: 75.0000 mg | ORAL_TABLET | Freq: Every day | ORAL | Status: DC

## 2012-03-31 NOTE — Progress Notes (Signed)
Chief complaint I have been feeling more anxious and complain of poor sleep due to pain.    History of present illness Patient is 56 year old Caucasian female who came for her followup appointment.  Patient described recently her pain has been getting worse and she has unable to sleep very well.  She used to take Neurontin in heavy dose however Neurontin has cause swelling in her feet and she could not take more than 300 mg at bedtime.  Patient brought the list of medication which is prescribed by her primary care physician.  She also brought recent blood test which was done by her primary care physician.  Patient admitted sometime poor sleep, racing thoughts anxiety spells.  She continues to have difficulty trusting her boyfriend.  She is getting phone calls from his ex-girlfriend and when she tries to confront him he gets very irritable.  Patient sleeps 5-6 hours but her sleep is restless.  Patient denies any agitation anger or mood swings.  She denies any crying spells but endorse increased anxiety.  She denies any active or passive suicidal thinking.  Current psychiatric medication Strattera 40 mg daily Amitriptyline 50 mg at bedtime Lamictal 150 mg daily   Medical history Hyperlipidemia, cerebral palsy, borderline diabetes , acromegaly and irritable bowel syndrome .  Her last blood work which was done on April 25 shows white blood count 7.7, RBC 5 point 32, hemoglobin 14.9, platelet 246, her glucose was 181, amylase 22, BUN 7 and creatinine 54.48-year-old.  Her case she was marginally elevated 87 along with ALT 91.  Patient has history of high liver function test.  Patient also brought a list of medication. She is taking pravastatin,.  Number, Vistaril, dicyclomine and gabapentin.  Mental status examination Patient is casually dressed and well groomed.  She uses electronic wheelchair for ambulation .  She appears tired but pleasant and relevant in conversation.Her speech is soft clear and  coherent. She described her mood is anxious and her affect is mood congruent. She denies any active or passive suicidal thoughts or homicidal thoughts.  She denies any auditory or visual hallucination.  Her attention and concentration is good .  There no psychotic symptoms present. She's alert and oriented x3. Her insight judgment and pulse control is okay.  Assessment Axis I Bipolar disorder NOS, ADD by history Axis II deferred Axis III see medical history Axis IV mild to moderate Axis V 60-65  Plan I review her blood test, list of medication and psychosocial stressor.  I do believe patient will get benefit if we increase her amitriptyline to 75 mg at bedtime.  However I recommend to reduce her gabapentin.  I will continue her Strattera and Lamictal to her present does.  I recommend to call us if she has any question or concern about the medication or if she feel worsening of the symptoms.  Time spent 30 minutes.  I will see her again in one month.

## 2012-04-02 ENCOUNTER — Encounter: Payer: Self-pay | Admitting: Obstetrics and Gynecology

## 2012-04-02 ENCOUNTER — Ambulatory Visit (INDEPENDENT_AMBULATORY_CARE_PROVIDER_SITE_OTHER): Payer: Medicare Other | Admitting: Obstetrics and Gynecology

## 2012-04-02 VITALS — BP 120/80 | Temp 98.1°F | Ht 67.0 in | Wt 232.0 lb

## 2012-04-02 DIAGNOSIS — Z124 Encounter for screening for malignant neoplasm of cervix: Secondary | ICD-10-CM

## 2012-04-02 DIAGNOSIS — N879 Dysplasia of cervix uteri, unspecified: Secondary | ICD-10-CM | POA: Insufficient documentation

## 2012-04-02 DIAGNOSIS — D039 Melanoma in situ, unspecified: Secondary | ICD-10-CM | POA: Insufficient documentation

## 2012-04-02 DIAGNOSIS — C439 Malignant melanoma of skin, unspecified: Secondary | ICD-10-CM

## 2012-04-02 NOTE — Progress Notes (Signed)
Last Pap: 12/2009 WNL: Yes Regular Periods:no Contraception: none  Monthly Breast exam:no Tetanus<44yrs:yes Nl.Bladder Function:yes Daily BMs:yes Healthy Diet:yes Calcium:yes Mammogram:yes Exercise:no Seatbelt: yes Abuse at home: no Stressful work:no Sigmoid-colonoscopy: 70yrs ago wnl per pr Bone Density: No Physical Examination: Neck - supple, no significant adenopathy Chest - clear to auscultation, no wheezes, rales or rhonchi, symmetric air entry Heart - normal rate, regular rhythm, normal S1, S2, no murmurs, rubs, clicks or gallops Abdomen - soft, nontender, nondistended, no masses or organomegaly Breasts - breasts appear normal, no suspicious masses, no skin or nipple changes or axillary nodes Pelvic - normal external genitalia, vulva, vagina, cervix, uterus and adnexa, atrophic Rectal - normal rectal, no masses Musculoskeletal - no joint tenderness, deformity or swelling Extremities - peripheral pulses normal, no pedal edema, no clubbing or cyanosis Skin - normal coloration and turgor, no rashes, no suspicious skin lesions noted Normal AEX Pt due for mammogram yes scheduled for tomorrow colonoscopy due no Pap done yes pt with h/o cervical dysplasia RT one year Diet and exercise discussed

## 2012-04-04 LAB — PAP IG W/ RFLX HPV ASCU

## 2012-04-15 ENCOUNTER — Encounter: Payer: Self-pay | Admitting: Gastroenterology

## 2012-04-18 ENCOUNTER — Ambulatory Visit (AMBULATORY_SURGERY_CENTER): Payer: Medicare Other | Admitting: *Deleted

## 2012-04-18 VITALS — Ht 67.0 in | Wt 234.0 lb

## 2012-04-18 DIAGNOSIS — K219 Gastro-esophageal reflux disease without esophagitis: Secondary | ICD-10-CM

## 2012-04-18 DIAGNOSIS — Z1211 Encounter for screening for malignant neoplasm of colon: Secondary | ICD-10-CM

## 2012-04-18 MED ORDER — MOVIPREP 100 G PO SOLR: ORAL | Status: DC

## 2012-04-24 ENCOUNTER — Telehealth (HOSPITAL_COMMUNITY): Payer: Self-pay | Admitting: *Deleted

## 2012-04-24 ENCOUNTER — Other Ambulatory Visit (HOSPITAL_COMMUNITY): Payer: Self-pay | Admitting: *Deleted

## 2012-04-24 DIAGNOSIS — F319 Bipolar disorder, unspecified: Secondary | ICD-10-CM

## 2012-04-24 NOTE — Telephone Encounter (Signed)
Contacted patient. Last refill was 03/27/12 for #[phone]. Next appt is 04/28/12.Per patient has enough medicine to wait on refill.

## 2012-04-25 ENCOUNTER — Other Ambulatory Visit (HOSPITAL_COMMUNITY): Payer: Self-pay | Admitting: Psychiatry

## 2012-04-25 DIAGNOSIS — F319 Bipolar disorder, unspecified: Secondary | ICD-10-CM

## 2012-04-25 MED ORDER — LAMOTRIGINE 150 MG PO TABS: 150.0000 mg | ORAL_TABLET | Freq: Every day | ORAL | Status: DC

## 2012-04-28 ENCOUNTER — Ambulatory Visit (HOSPITAL_COMMUNITY): Payer: Self-pay | Admitting: Psychiatry

## 2012-04-30 ENCOUNTER — Other Ambulatory Visit: Payer: Self-pay | Admitting: Gastroenterology

## 2012-04-30 ENCOUNTER — Ambulatory Visit (INDEPENDENT_AMBULATORY_CARE_PROVIDER_SITE_OTHER): Payer: Medicare Other | Admitting: Psychiatry

## 2012-04-30 DIAGNOSIS — F319 Bipolar disorder, unspecified: Secondary | ICD-10-CM

## 2012-04-30 MED ORDER — ATOMOXETINE HCL 40 MG PO CAPS: 40.0000 mg | ORAL_CAPSULE | Freq: Every day | ORAL | Status: DC

## 2012-04-30 NOTE — Progress Notes (Signed)
Chief complaint I was taking accidentally both amitriptyline .  I was sleeping better but now I'm taking only 75 mg.      History of present illness Patient is 56 year old Caucasian female who came for her followup appointment.  Patient endorse her accidentally she was taking amitriptyline 50 mg and 75 mg together .  She do not realize until recently she corrected the dosage.  She admitted sleeping better when she was taking higher dose of amitriptyline.  She also feel less pain .  She is wondering if amitriptyline dose can be further increased.  I review her medication she is taking hydroxyzine , melatonin along with other medication.  It is unclear why she is taking hydroxyzine and melatonin for sleep .  Overall she is doing better.  She has chronic stressor but there has been no new issues .  She denies any agitation anger mood swing.  She endorse some pain since Neurontin has been reduced by her Dr. 2 months ago due to swelling.  She denies any active or passive suicidal thoughts.  Her swelling is much improved since Neurontin has been discontinued.    Current psychiatric medication Strattera 40 mg daily Amitriptyline 75 mg at bedtime Lamictal 150 mg daily   Medical history Hyperlipidemia, cerebral palsy, borderline diabetes , acromegaly and irritable bowel syndrome .  Her last blood work which was done on April 25 shows white blood count 7.7, RBC 5 point 32, hemoglobin 14.9, platelet 246, her glucose was 181, amylase 22, BUN 7 and creatinine 63.63-year-old.  Her case she was marginally elevated 87 along with ALT 91.  Patient has history of high liver function test.  Patient also brought a list of medication. She is taking pravastatin,.  Number, Vistaril, dicyclomine and gabapentin.  Mental status examination Patient is casually dressed and well groomed.  She uses electronic wheelchair for ambulation .  She is pleasant and cooperative.  She is relevant in conversation.Her speech is soft clear and  coherent. She described her mood is anxious and her affect is mood congruent. She denies any active or passive suicidal thoughts or homicidal thoughts.  She denies any auditory or visual hallucination.  Her attention and concentration is good .  There no psychotic symptoms present. She's alert and oriented x3. Her insight judgment and pulse control is okay.  Assessment Axis I Bipolar disorder NOS, ADD by history Axis II deferred Axis III see medical history Axis IV mild to moderate Axis V 60-65  Plan I review her medication list .  She is taking multiple medication for insomnia but she does not know who is prescribing hydroxyzine.  I recommend to bring list of medication along with bottles so we can clarify who is providing hydroxyzine.  I recommend we will consider increasing amitriptyline if she is not taking hydroxyzine.  Patient agreed.  I will see her again in 6 weeks.  I recommend to call us if his any question or concern or if she feels worsening of the symptoms.  Portion of this note is generated with voice recognition software and may contain typographical error.

## 2012-05-22 ENCOUNTER — Telehealth: Payer: Self-pay | Admitting: Gastroenterology

## 2012-05-22 NOTE — Telephone Encounter (Signed)
Pt will come by office to get sample of MoviPrep.

## 2012-05-22 NOTE — Telephone Encounter (Signed)
Pt called to say that her MoviPrep was not at her pharmacy.  Prep called in to [facility] on [address].  Pt notified.

## 2012-05-23 ENCOUNTER — Encounter: Payer: Self-pay | Admitting: Gastroenterology

## 2012-05-23 ENCOUNTER — Ambulatory Visit (AMBULATORY_SURGERY_CENTER): Payer: Medicare Other | Admitting: Gastroenterology

## 2012-05-23 VITALS — BP 133/66 | HR 81 | Temp 95.5°F | Resp 24 | Ht 67.0 in | Wt 234.0 lb

## 2012-05-23 DIAGNOSIS — K219 Gastro-esophageal reflux disease without esophagitis: Secondary | ICD-10-CM

## 2012-05-23 DIAGNOSIS — Z1211 Encounter for screening for malignant neoplasm of colon: Secondary | ICD-10-CM

## 2012-05-23 DIAGNOSIS — E220 Acromegaly and pituitary gigantism: Secondary | ICD-10-CM

## 2012-05-23 DIAGNOSIS — K644 Residual hemorrhoidal skin tags: Secondary | ICD-10-CM

## 2012-05-23 MED ORDER — SODIUM CHLORIDE 0.9 % IV SOLN: 500.0000 mL | INTRAVENOUS | Status: DC

## 2012-05-23 NOTE — Op Note (Signed)
[facility] [address] [place], [place]  [address]  COLONOSCOPY PROCEDURE REPORT  PATIENT:  [name], [name]  MR#:  [id number] BIRTHDATE:  [birth year], 55 yrs. old  GENDER:  female ENDOSCOPIST:  Rachael Fee, MD PROCEDURE DATE:  05/23/2012 PROCEDURE:  Colonoscopy 62130 ASA CLASS:  Class II INDICATIONS:  elevated risk for colon cancer due to acromegaly MEDICATIONS:   Fentanyl 75 mcg IV, These medications were titrated to patient response per physician's verbal order, Versed 6 mg IV  DESCRIPTION OF PROCEDURE:   After the risks benefits and alternatives of the procedure were thoroughly explained, informed consent was obtained.  Digital rectal exam was performed and revealed no rectal masses.   The LB [id number] endoscope was introduced through the anus and advanced to the cecum, which was identified by both the appendix and ileocecal valve, without limitations.  The quality of the prep was good..  The instrument was then slowly withdrawn as the colon was fully examined. <<PROCEDUREIMAGES>> FINDINGS:  Internal and external Hemorrhoids were found.  This was otherwise a normal examination of the colon (see image1, image2, and image3).   Retroflexed views in the rectum revealed no abnormalities. COMPLICATIONS:  None  ENDOSCOPIC IMPRESSION: 1) Internal and external hemorrhoids 2) Otherwise normal examination; no polyps or cancers  RECOMMENDATIONS: 1) Given your increased risk for colon cancer, you will need repeat colonoscoyp in 5 years for screening  REPEAT EXAM:  5 years  ______________________________ Rachael Fee, MD  cc: Burnell Blanks MD  n. Rosalie DoctorRachael Fee at 05/23/2012 03:15 PM  Geni Bers, [phone]

## 2012-05-23 NOTE — Progress Notes (Signed)
Pt repositioned for egd with scope change. Maw

## 2012-05-23 NOTE — Progress Notes (Signed)
Abdominal pressure by the tech to aid the scope advancement. Maw   

## 2012-05-23 NOTE — Progress Notes (Signed)
The pt tolerated the colonoscopy very well. Maw   

## 2012-05-23 NOTE — Progress Notes (Signed)
Patient did not experience any of the following events: a burn prior to discharge; a fall within the facility; wrong site/side/patient/procedure/implant event; or a hospital transfer or hospital admission upon discharge from the facility. (G8907) Patient did not have preoperative order for IV antibiotic SSI prophylaxis. (G8918)  

## 2012-05-23 NOTE — Progress Notes (Signed)
The pt tolerated the egd very well. Maw   

## 2012-05-23 NOTE — Op Note (Signed)
[facility] [address] [place], [place]  [address]  ENDOSCOPY PROCEDURE REPORT  PATIENT:  Katherine, [name]  MR#:  [id number] BIRTHDATE:  07-18-1956, 55 yrs. old  GENDER:  female ENDOSCOPIST:  Rachael Fee, MD PROCEDURE DATE:  05/23/2012 PROCEDURE:  EGD, diagnostic 43235 ASA CLASS:  Class II INDICATIONS:  increased risk for esophageal, gastric cancer due to acromegaly MEDICATIONS:  There was residual sedation effect present from prior procedure., These medications were titrated to patient response per physician's verbal order, Versed 1 mg IV TOPICAL ANESTHETIC:  none  DESCRIPTION OF PROCEDURE:   After the risks benefits and alternatives of the procedure were thoroughly explained, informed consent was obtained.  The LB [id number] endoscope was introduced through the mouth and advanced to the second portion of the duodenum, without limitations.  The instrument was slowly withdrawn as the mucosa was fully examined. <<PROCEDUREIMAGES>> The upper, middle, and distal third of the esophagus were carefully inspected and no abnormalities were noted. The z-line was well seen at the GEJ. The endoscope was pushed into the fundus which was normal including a retroflexed view. The antrum,gastric body, first and second part of the duodenum were unremarkable (see image1, image3, image4, image5, image6, and image7). Retroflexed views revealed no abnormalities.    The scope was then withdrawn from the patient and the procedure completed. COMPLICATIONS:  None  ENDOSCOPIC IMPRESSION: 1) Normal EGD  RECOMMENDATIONS: Repeat EGD in 5 years given increased risk for esophageal, gastric cancer.  REPEAT EXAM:  5 years  ______________________________ Rachael Fee, MD  cc: Burnell Blanks,  MD  n. Rosalie Doctor:   Rachael Fee at 05/23/2012 03:22 PM  Geni Bers, [phone]

## 2012-05-23 NOTE — Patient Instructions (Addendum)
YOU HAD AN ENDOSCOPIC PROCEDURE TODAY AT THE [facility]: Refer to the procedure report that was given to you for any specific questions about what was found during the examination.  If the procedure report does not answer your questions, please call your gastroenterologist to clarify.  If you requested that your care partner not be given the details of your procedure findings, then the procedure report has been included in a sealed envelope for you to review at your convenience later.  YOU SHOULD EXPECT: Some feelings of bloating in the abdomen. Passage of more gas than usual.  Walking can help get rid of the air that was put into your GI tract during the procedure and reduce the bloating. If you had a lower endoscopy (such as a colonoscopy or flexible sigmoidoscopy) you may notice spotting of blood in your stool or on the toilet paper. If you underwent a bowel prep for your procedure, then you may not have a normal bowel movement for a few days.  DIET: Your first meal following the procedure should be a light meal and then it is ok to progress to your normal diet.  A half-sandwich or bowl of soup is an example of a good first meal.  Heavy or fried foods are harder to digest and may make you feel nauseous or bloated.  Likewise meals heavy in dairy and vegetables can cause extra gas to form and this can also increase the bloating.  Drink plenty of fluids but you should avoid alcoholic beverages for 24 hours.  ACTIVITY: Your care partner should take you home directly after the procedure.  You should plan to take it easy, moving slowly for the rest of the day.  You can resume normal activity the day after the procedure however you should NOT DRIVE or use heavy machinery for 24 hours (because of the sedation medicines used during the test).    SYMPTOMS TO REPORT IMMEDIATELY: A gastroenterologist can be reached at any hour.  During normal business hours, 8:30 AM to 5:00 PM Monday through Friday,  call [phone].  After hours and on weekends, please call the GI answering service at [phone] who will take a message and have the physician on call contact you.   Following lower endoscopy (colonoscopy or flexible sigmoidoscopy):  Excessive amounts of blood in the stool  Significant tenderness or worsening of abdominal pains  Swelling of the abdomen that is new, acute  Fever of 100F or higher  Following upper endoscopy (EGD)  Vomiting of blood or coffee ground material  New chest pain or pain under the shoulder blades  Painful or persistently difficult swallowing  New shortness of breath  Fever of 100F or higher  Black, tarry-looking stools  FOLLOW UP: If any biopsies were taken you will be contacted by phone or by letter within the next 1-3 weeks.  Call your gastroenterologist if you have not heard about the biopsies in 3 weeks.  Our staff will call the home number listed on your records the next business day following your procedure to check on you and address any questions or concerns that you may have at that time regarding the information given to you following your procedure. This is a courtesy call and so if there is no answer at the home number and we have not heard from you through the emergency physician on call, we will assume that you have returned to your regular daily activities without incident.  SIGNATURES/CONFIDENTIALITY: You and/or your care   partner have signed paperwork which will be entered into your electronic medical record.  These signatures attest to the fact that that the information above on your After Visit Summary has been reviewed and is understood.  Full responsibility of the confidentiality of this discharge information lies with you and/or your care-partner.  

## 2012-05-26 ENCOUNTER — Telehealth: Payer: Self-pay | Admitting: *Deleted

## 2012-05-26 NOTE — Telephone Encounter (Signed)
  Follow up Call-  Call back number 05/23/2012  Post procedure Call Back phone  # [phone]  Permission to leave phone message Yes     Patient questions:  Do you have a fever, pain , or abdominal swelling? no Pain Score  0 *  Have you tolerated food without any problems? yes  Have you been able to return to your normal activities? yes  Do you have any questions about your discharge instructions: Diet   no Medications  no Follow up visit  no  Do you have questions or concerns about your Care? no  Actions: * If pain score is 4 or above: No action needed, pain <4.

## 2012-06-11 ENCOUNTER — Encounter (HOSPITAL_COMMUNITY): Payer: Self-pay | Admitting: Psychiatry

## 2012-06-11 ENCOUNTER — Ambulatory Visit (INDEPENDENT_AMBULATORY_CARE_PROVIDER_SITE_OTHER): Payer: Medicare Other | Admitting: Psychiatry

## 2012-06-11 DIAGNOSIS — F319 Bipolar disorder, unspecified: Secondary | ICD-10-CM

## 2012-06-11 MED ORDER — ATOMOXETINE HCL 40 MG PO CAPS: 40.0000 mg | ORAL_CAPSULE | Freq: Every day | ORAL | Status: DC

## 2012-06-11 MED ORDER — LAMOTRIGINE 150 MG PO TABS: 150.0000 mg | ORAL_TABLET | Freq: Every day | ORAL | Status: DC

## 2012-06-11 MED ORDER — AMITRIPTYLINE HCL 100 MG PO TABS: 100.0000 mg | ORAL_TABLET | Freq: Every day | ORAL | Status: DC

## 2012-06-11 NOTE — Progress Notes (Signed)
Chief complaint I stopped taking Vistaril.  I forgot to bring my medication.  History of present illness Patient is 57 year old Caucasian female who came for her followup appointment.  On her last appointment patient mentioned that she was taking 50 mg and 75 mg of amitriptyline together accidentally and she was feeling better.  She is also taking Vistaril melatonin muscle relaxant and pain medication.  I have recommended to bring bottles of medication to clarify however patient forgot to bring bottles.  She still taking amitriptyline 75 mg 50 mg it is helping her sleep and pain.  However she started taking Vistaril.  She's wondering if amitriptyline dose can be adjusted .  She does not want to reduce the dosage since it is working very well.  She denies any agitation anger mood swing.  She is seeing therapist for coping and social skills.  She denies any agitation or any crying spells.  She's not drinking or using any illegal substance.  Her swelling is improved since Neurontin has been discontinued.  Current psychiatric medication Strattera 40 mg daily Amitriptyline 75 mg at bedtime Lamictal 150 mg daily   Medical history Hyperlipidemia, cerebral palsy, borderline diabetes , acromegaly and irritable bowel syndrome .  Her last blood work which was done on April 25 shows white blood count 7.7, RBC 5 point 32, hemoglobin 14.9, platelet 246, her glucose was 181, amylase 22, BUN 7 and creatinine 68.12-year-old.  Her case she was marginally elevated 87 along with ALT 91.  Patient has history of high liver function test.  Patient also brought a list of medication. She is taking pravastatin,.  Number, Vistaril, dicyclomine and gabapentin.  Mental status examination Patient is casually dressed and well groomed.  She uses electronic wheelchair for ambulation .  She is pleasant and cooperative.  She is relevant in conversation.Her speech is soft clear and coherent. She described her mood is anxious and her  affect is mood congruent. She denies any active or passive suicidal thoughts or homicidal thoughts.  She denies any auditory or visual hallucination.  Her attention and concentration is good .  There no psychotic symptoms present. She's alert and oriented x3. Her insight judgment and pulse control is okay.  Assessment Axis I Bipolar disorder NOS, ADD by history Axis II deferred Axis III see medical history Axis IV mild to moderate Axis V 60-65  Plan I recommend to try amitriptyline 100 mg at bedtime.  She will stopped taking 50 mg amitriptyline leftover.  I will continue her Strattera and Lamictal at present does.  She is not taking Vistaril at this time.  I recommend to call us if she is any question or concern about the medication or if he feel worsening of the symptoms.  One more time I encouraged her to bring bottles of the medication to clarify the dosage.  I will see her again in 3 months.  Portion of this note is generated with voice recognition software and may contain typographical error.

## 2012-06-16 ENCOUNTER — Other Ambulatory Visit (HOSPITAL_COMMUNITY): Payer: Self-pay | Admitting: *Deleted

## 2012-09-10 ENCOUNTER — Encounter (HOSPITAL_COMMUNITY): Payer: Self-pay | Admitting: Psychiatry

## 2012-09-10 ENCOUNTER — Ambulatory Visit (INDEPENDENT_AMBULATORY_CARE_PROVIDER_SITE_OTHER): Payer: Medicare Other | Admitting: Psychiatry

## 2012-09-10 DIAGNOSIS — F319 Bipolar disorder, unspecified: Secondary | ICD-10-CM

## 2012-09-10 DIAGNOSIS — F988 Other specified behavioral and emotional disorders with onset usually occurring in childhood and adolescence: Secondary | ICD-10-CM

## 2012-09-10 MED ORDER — AMITRIPTYLINE HCL 75 MG PO TABS: 75.0000 mg | ORAL_TABLET | Freq: Every day | ORAL | Status: DC

## 2012-09-10 MED ORDER — LAMOTRIGINE 150 MG PO TABS: 150.0000 mg | ORAL_TABLET | Freq: Every day | ORAL | Status: DC

## 2012-09-10 NOTE — Progress Notes (Signed)
Chief complaint I gave some weight.  History of present illness Patient is 56 year old Caucasian female who came for her followup appointment.  On her last visit we increased amitriptyline 75 mg to 100 mg.  She sleeping better but she has gained some weight.  She cut down her melatonin .  She is taking only 3 mg.  Overall her mood is better.  She denies any irritability agitation anger .  She's taking the Strattera for her ADD symptoms.  She still has some pain but she believe amitriptyline helping.  She denies any paranoia or any hallucination.  She denies any crying spells.  She's not drinking or using any illegal substance.  Her swelling has much improved since she stopped Neurontin.    Current psychiatric medication Strattera 40 mg daily Amitriptyline 75 mg at bedtime Lamictal 150 mg daily   Medical history Hyperlipidemia, cerebral palsy, borderline diabetes , acromegaly and irritable bowel syndrome . Patient has history of high liver function test.    Mental status examination Patient is casually dressed and well groomed.  She uses electronic wheelchair for ambulation .  She has gained weight from the past.  She is pleasant and cooperative.  She is relevant in conversation.Her speech is soft clear and coherent. She described her mood is anxious and her affect is mood congruent. She denies any active or passive suicidal thoughts or homicidal thoughts.  She denies any auditory or visual hallucination.  Her attention and concentration is good .  There no psychotic symptoms present. She's alert and oriented x3. Her insight judgment and pulse control is okay.  Assessment Axis I Bipolar disorder NOS, ADD by history Axis II deferred Axis III see medical history Axis IV mild to moderate Axis V 60-65  Plan I recommend to decrease amitriptyline to 75 mg.  I recommend if she cannot sleep then she can take melatonin 2 tablets.  Overall she is doing better on her medication.  She still has refill  remaining on her Strattera.  She need a new prescription of amitriptyline and Lamictal.  I explained risks and benefits of medication.  She does not have any rash or itching with Lamictal.  I recommend to call us if she is any question or concern about the medication if she feels worsening of the symptom.  I will see her again in 3 months.  Portion of this note is generated with voice recognition software and may contain typographical error.

## 2012-09-15 ENCOUNTER — Other Ambulatory Visit: Payer: Self-pay | Admitting: Family Medicine

## 2012-09-15 DIAGNOSIS — Z780 Asymptomatic menopausal state: Secondary | ICD-10-CM

## 2012-09-18 ENCOUNTER — Other Ambulatory Visit: Payer: Self-pay

## 2012-09-24 ENCOUNTER — Other Ambulatory Visit (HOSPITAL_COMMUNITY): Payer: Self-pay | Admitting: *Deleted

## 2012-09-24 DIAGNOSIS — F319 Bipolar disorder, unspecified: Secondary | ICD-10-CM

## 2012-09-24 MED ORDER — ATOMOXETINE HCL 40 MG PO CAPS: 40.0000 mg | ORAL_CAPSULE | Freq: Every day | ORAL | Status: DC

## 2012-10-27 ENCOUNTER — Emergency Department (INDEPENDENT_AMBULATORY_CARE_PROVIDER_SITE_OTHER)
Admission: EM | Admit: 2012-10-27 | Discharge: 2012-10-27 | Disposition: A | Discharge status: Home / Self Care | Payer: Medicaid Other | Source: Home / Self Care | Attending: Emergency Medicine | Admitting: Emergency Medicine

## 2012-10-27 ENCOUNTER — Encounter (HOSPITAL_COMMUNITY): Payer: Self-pay

## 2012-10-27 DIAGNOSIS — K089 Disorder of teeth and supporting structures, unspecified: Secondary | ICD-10-CM

## 2012-10-27 DIAGNOSIS — K0889 Other specified disorders of teeth and supporting structures: Secondary | ICD-10-CM

## 2012-10-27 MED ORDER — ACETAMINOPHEN-CODEINE #3 300-30 MG PO TABS: 1.0000 | ORAL_TABLET | Freq: Four times a day (QID) | ORAL | Status: DC | PRN

## 2012-10-27 NOTE — ED Provider Notes (Signed)
History     CSN: [id number]  Arrival date & time 10/27/12  1018   First MD Initiated Contact with Patient 10/27/12 1100      Chief Complaint  Patient presents with  . Dental Pain    (Consider location/radiation/quality/duration/timing/severity/associated sxs/prior treatment) HPI Comments: Patient presents urgent care this morning complaining of an ongoing right lower molar pain. Throbbing in character. She described that she thinks she needs to canal. She expressed previously to her nurse that she's been having dental problem since October. I have asked to confirm  timeframe and she has  Described to me " 2-3 days she's been having right lower tooth aches". Patient denies any fevers or facial swelling or difficulty swallowing. She has not call or has seen a dentist  Patient is a 56 y.o. female presenting with tooth pain. The history is provided by the patient.  Dental PainThe primary symptoms include mouth pain. Primary symptoms do not include dental injury, oral bleeding, oral lesions, fever, sore throat or angioedema. The symptoms began 3 to 5 days ago. The symptoms are unchanged. The symptoms occur constantly.  Additional symptoms include: dental sensitivity to temperature. Additional symptoms do not include: gum swelling, trouble swallowing, pain with swallowing, drooling, ear pain and hearing loss.    Past Medical History  Diagnosis Date  . Anxiety   . High cholesterol   . Cerebral palsy   . Borderline diabetes   . Melanoma in situ   . Dysplasia of cervix   . Neck mass   . IBS (irritable bowel syndrome)   . Acromegaly   . Headache   . Bladder infection   . Colitis   . History of chicken pox   . History of mumps   . History of measles   . Yeast infection   . Bacterial infection   . Trichomonas   . Ovarian cyst   . Depression   . Arthritis   . GERD (gastroesophageal reflux disease)   . Kidney infection     Past Surgical History  Procedure Date  . Leg tendon  surgery   . Knee rotation   . Rotator cuff surgery   . Appendectomy   . Carpal tunnel release   . Ovarian cyst removal   . Replacement total knee bilateral 2006    Family History  Problem Relation Age of Onset  . Suicidality Father   . Depression Father   . Alcohol abuse Father   . Drug abuse Brother   . Alcohol abuse Brother   . Esophageal cancer Brother   . Depression Maternal Aunt   . Alcohol abuse Brother   . Anxiety disorder Daughter   . Depression Daughter   . Suicidality Daughter   . Alcohol abuse Daughter   . Drug abuse Son   . Alcohol abuse Son     History  Substance Use Topics  . Smoking status: Former Smoker -- 31 years    Types: Cigarettes    Quit date: 11/18/1999  . Smokeless tobacco: Never Used  . Alcohol Use: No    OB History    Grav Para Term Preterm Abortions TAB SAB Ect Mult Living   3 2              Review of Systems  Constitutional: Negative for fever, chills, diaphoresis, activity change and appetite change.  HENT: Positive for dental problem. Negative for hearing loss, ear pain, sore throat, drooling, mouth sores and trouble swallowing.     Allergies  Miconazole  nitrate and Sulfonamide derivatives  Home Medications   Current Outpatient Rx  Name  Route  Sig  Dispense  Refill  . ACETAMINOPHEN-CODEINE #3 300-30 MG PO TABS   Oral   Take 1-2 tablets by mouth every 6 (six) hours as needed for pain.   15 tablet   0   . AMITRIPTYLINE HCL 75 MG PO TABS   Oral   Take 1 tablet (75 mg total) by mouth at bedtime.   90 tablet   0   . ATOMOXETINE HCL 40 MG PO CAPS   Oral   Take 1 capsule (40 mg total) by mouth daily.   90 capsule   0   . COCONUT OIL 1000 MG PO CAPS   Oral   Take 1 capsule by mouth 2 (two) times daily.         . COD LIVER OIL PO   Oral   Take 1 capsule by mouth daily. Has flax seed in it         . DICYCLOMINE HCL 20 MG PO TABS   Oral   Take 20 mg by mouth every 6 (six) hours.         [name] DIPHENHYDRAMINE HCL  25 MG PO CAPS   Oral   Take 25 mg by mouth at bedtime.         [name] FLUTICASONE FUROATE 27.5 MCG/SPRAY NA SUSP   Nasal   Place 2 sprays into the nose daily.         . FUROSEMIDE 40 MG PO TABS   Oral   Take 40 mg by mouth 2 (two) times daily.         [name] GARLIC 1000 MG PO CAPS   Oral   Take 1 capsule by mouth daily.         [name] HORSE CHESTNUT PO   Oral   Take 200 mg by mouth 2 (two) times daily.         [name] KELP PO   Oral   Take 100 mg by mouth daily.         [name] LAMOTRIGINE 150 MG PO TABS   Oral   Take 1 tablet (150 mg total) by mouth daily.   90 tablet   0   . MELATONIN 3 MG PO TABS   Oral   Take 3 mg by mouth.         . MULTI-VITAMIN/MINERALS PO TABS   Oral   Take 1 tablet by mouth daily.         [name] NAPROXEN 500 MG PO TABS               . OMEPRAZOLE 40 MG PO CPDR   Oral   Take 40 mg by mouth daily.         [name] PRAVASTATIN SODIUM 40 MG PO TABS   Oral   Take 40 mg by mouth daily.         [name] PROBIOTIC & ACIDOPHILUS EX ST PO CAPS   Oral   Take 1 capsule by mouth daily.         . VENTOLIN HFA 108 (90 BASE) MCG/ACT IN AERS               . VINPOCETINE POWD   Oral   Take 10 mg by mouth daily.           BP 112/71  Pulse 90  Temp 97.3 F (36.3 C) (Oral)  Resp 20  SpO2 97%  Physical Exam  Nursing note and vitals reviewed. Constitutional: She appears well-developed and well-nourished.  Non-toxic appearance. She does not have a sickly appearance. She does not appear ill. No distress.  HENT:  Mouth/Throat: Uvula is midline and oropharynx is clear and moist. No dental abscesses or uvula swelling.    Neurological: She is alert.  Skin: No erythema.    ED Course  Procedures (including critical care time)  Labs Reviewed - No data to display No results found.   1. Pain, dental       MDM  Right lower molar pain. No office signs of a dental or periapical abscess had been referred to see the dentist. Was provided with 15 tablets  of Tylenol No. 3 for pain control. She agrees with treatment plan and will call provided referral        Jimmie Molly, MD 10/27/12 1143

## 2012-10-27 NOTE — ED Notes (Signed)
Dental pain since 10-26, worse past few days

## 2012-12-11 ENCOUNTER — Ambulatory Visit (HOSPITAL_COMMUNITY): Payer: Self-pay | Admitting: Psychiatry

## 2012-12-17 ENCOUNTER — Encounter (HOSPITAL_COMMUNITY): Payer: Self-pay | Admitting: Psychiatry

## 2012-12-17 ENCOUNTER — Ambulatory Visit (INDEPENDENT_AMBULATORY_CARE_PROVIDER_SITE_OTHER): Payer: PRIVATE HEALTH INSURANCE | Admitting: Psychiatry

## 2012-12-17 DIAGNOSIS — F319 Bipolar disorder, unspecified: Secondary | ICD-10-CM

## 2012-12-17 MED ORDER — AMITRIPTYLINE HCL 75 MG PO TABS
75.0000 mg | ORAL_TABLET | Freq: Every day | ORAL | Status: DC
Start: 2012-12-17 — End: 2013-03-16

## 2012-12-17 MED ORDER — LAMOTRIGINE 150 MG PO TABS: 150.0000 mg | ORAL_TABLET | Freq: Every day | ORAL | Status: DC

## 2012-12-17 MED ORDER — ATOMOXETINE HCL 40 MG PO CAPS: 40.0000 mg | ORAL_CAPSULE | Freq: Every day | ORAL | Status: DC

## 2012-12-17 NOTE — Progress Notes (Signed)
Chief complaint. My aunt died 3 weeks ago.  History of present illness Patient is 57 year old Caucasian female who came for her followup appointment.  She's compliant with her medication and denies any side effects.  On her last visit we cut down her amitriptyline from 100 mg to 75 mg as patient complaining of weight gain.  Today she did not get her weight but believe her weight is better from the past.  Recently she is concerned about her family members.  Her aunt died 3 weeks ago and her brother had a stroke due to using cocaine.  She was very stressed about it.  Also around the same time her car broke down however she got a new car from her boyfriend.  She is happy about it.  She is taking over-the-counter medication for insomnia which are melatonin, Benadryl .  She stopped taking Vistaril.  She's compliant with the Strattera amitriptyline and Lamictal.  She denies any side effects.  She denies any agitation anger mood swing.  She's not drinking or using any illegal substance.  Current psychiatric medication Strattera 40 mg daily Amitriptyline 75 mg at bedtime Lamictal 150 mg daily   Medical history Hyperlipidemia, cerebral palsy, borderline diabetes , acromegaly and irritable bowel syndrome . Patient has history of high liver function test.    Mental status examination Patient is casually dressed and well groomed.  She uses electronic wheelchair for ambulation .  She is pleasant and cooperative.  She is relevant in conversation.Her speech is soft clear and coherent. She described her mood is anxious and her affect is mood congruent. She denies any active or passive suicidal thoughts or homicidal thoughts.  She denies any auditory or visual hallucination.  Her attention and concentration is good .  There no psychotic symptoms present. She's alert and oriented x3. Her insight judgment and pulse control is okay.  Assessment Axis I Bipolar disorder NOS, ADD by history Axis II deferred Axis III  see medical history Axis IV mild to moderate Axis V 60-65  Plan I will continue her current psychiatric medication which are amitriptyline 75 mg, Lamictal 150 mg in the Strattera 40 mg.  She's not seeing therapist at this time as felt she does not need to see her anymore.  Risk and benefit explain.  Recommend to call us if she is any question or concern if he feel worsening of the symptom.  I will see her again in 3 months.  Portion of this note is generated with voice recognition software and may contain typographical error.

## 2012-12-19 ENCOUNTER — Telehealth (HOSPITAL_COMMUNITY): Payer: Self-pay

## 2012-12-20 ENCOUNTER — Other Ambulatory Visit: Payer: Self-pay | Admitting: Psychiatry

## 2012-12-23 ENCOUNTER — Encounter (HOSPITAL_COMMUNITY): Payer: Self-pay | Admitting: *Deleted

## 2012-12-23 NOTE — Progress Notes (Signed)
Strattera 40 mg daily authorized by [employer] - Effective through 10/28/13 Patient and [facility] pharmacy notified

## 2013-02-16 ENCOUNTER — Encounter (HOSPITAL_COMMUNITY): Payer: Self-pay | Admitting: *Deleted

## 2013-02-24 ENCOUNTER — Encounter: Payer: Self-pay | Admitting: Neurology

## 2013-02-24 ENCOUNTER — Ambulatory Visit (INDEPENDENT_AMBULATORY_CARE_PROVIDER_SITE_OTHER): Payer: PRIVATE HEALTH INSURANCE | Admitting: Neurology

## 2013-02-24 VITALS — BP 141/84 | HR 90 | Temp 98.8°F | Wt 227.0 lb

## 2013-02-24 DIAGNOSIS — R609 Edema, unspecified: Secondary | ICD-10-CM

## 2013-02-24 DIAGNOSIS — H8110 Benign paroxysmal vertigo, unspecified ear: Secondary | ICD-10-CM

## 2013-02-24 DIAGNOSIS — R51 Headache: Secondary | ICD-10-CM

## 2013-02-24 DIAGNOSIS — R600 Localized edema: Secondary | ICD-10-CM

## 2013-02-24 HISTORY — DX: Benign paroxysmal vertigo, unspecified ear: H81.10

## 2013-02-24 NOTE — Patient Instructions (Addendum)
I think overall you are doing fairly well but I do want to suggest a few things today:  I will order a brain MRI and MRA. We may send you for physical therapy and for ENT consult.   Remember to drink plenty of fluid, eat healthy meals and do not skip any meals. Try to eat protein with a every meal and eat a healthy snack such as fruit or nuts in between meals. Try to keep a regular sleep-wake schedule and try to exercise daily, particularly in the form of walking, 20-30 minutes a day, if you can.   As far as your medications are concerned, I would like to suggest no new medications.   I would like to see you back in 3 months, sooner if we need to. Please call us with any interim questions, concerns, problems, updates or refill requests.  Please also call us for any test results so we can go over those with you on the phone. Brett Canales is my clinical assistant and will answer any of your questions and relay your messages to me and also relay most of my messages to you.  Our phone number is [phone]. We also have an after hours call service for urgent matters and there is a physician on-call for urgent questions. For any emergencies you know to call 911 or go to the nearest emergency room.

## 2013-02-24 NOTE — Progress Notes (Signed)
Subjective:    Patient ID: [name] is a 57 y.o. female.  HPI  Huston Foley, MD, PhD [facility] [address] [address] [place], [place] [address]  Dear [name],  I saw your patient, [name], Upon your kind request in my neurologic clinic today for initial consultation of her dizziness. The patient is unaccompanied today. As you know, Ms. Scarpelli is a 57 year old right-handed woman with an underlying medical history of acromegaly, hyperlipidemia, cerebral palsy, bipolar, emphysema, eczema, reflux disease, rosacea, irritable bowel syndrome, allergic rhinitis and peripheral neuropathy, who has been experiencing intermittent vertiginous spells since February of this year. This started fairly abruptly when she woke up from sleep. She has had a sense of the room spinning and has also reported right ear fullness. She has had symptoms primarily with sudden changes in head posture. And keeping the head still improves her symptoms.She had blood work in your office which included a normal CBC and and normal CMP with the exception of elevated AST at 47 and elevated ALT at 63. She was started on meclizine as needed. She describes no prodromal illness such as fevers or chills or chest pain or shortness of breath. She denies one-sided neurological or focal symptoms such as weakness, numbness, tingling, slurring of speech, facial droop, hearing loss, or recurrent headaches. She is currently onOmeprazole, Flonase, pravastatin, Ventolin, Lasix, promethazine, flaxseed oil, coconut oil, vitamin B12, calcium, vitamin, vitamin D, naproxen, probiotic, cyclobenzaprine, amitriptyline, lamotrigine, multivitamin, horsechestnut, melatonin, Strattera. She has intermittent tinnitus bilaterally, but this is not new. She has had a rare HA, which was bifrontal. She was diagnosed with acromegaly in 2007.  She reports nearly daily episodes of vertigo since the onset in February. She stays in  her motorized scooter most of the time.   Her Past Medical History Is Significant For: Past Medical History  Diagnosis Date  . Anxiety   . High cholesterol   . Cerebral palsy   . Borderline diabetes   . Melanoma in situ   . Dysplasia of cervix   . Neck mass   . IBS (irritable bowel syndrome)   . Acromegaly   . Headache   . Bladder infection   . Colitis   . History of chicken pox   . History of mumps   . History of measles   . Yeast infection   . Bacterial infection   . Trichomonas   . Ovarian cyst   . Depression   . Arthritis   . GERD (gastroesophageal reflux disease)   . Kidney infection     Her Past Surgical History Is Significant For: Past Surgical History  Procedure Laterality Date  . Leg tendon surgery    . Knee rotation    . Rotator cuff surgery    . Appendectomy    . Carpal tunnel release    . Ovarian cyst removal    . Replacement total knee bilateral  2006    Her Family History Is Significant For: Family History  Problem Relation Age of Onset  . Suicidality Father   . Depression Father   . Alcohol abuse Father   . Drug abuse Brother   . Alcohol abuse Brother   . Esophageal cancer Brother   . Depression Maternal Aunt   . Alcohol abuse Brother   . Anxiety disorder Daughter   . Depression Daughter   . Suicidality Daughter   . Alcohol abuse Daughter   . Drug abuse Son   . Alcohol abuse  Son     Her Social History Is Significant For: History   Social History  . Marital Status: Divorced    Spouse Name: N/A    Number of Children: N/A  . Years of Education: N/A   Social History Main Topics  . Smoking status: Former Smoker -- 31 years    Types: Cigarettes    Quit date: 11/18/1999  . Smokeless tobacco: Never Used  . Alcohol Use: No  . Drug Use: No  . Sexually Active: No   Other Topics Concern  . None   Social History Narrative  . None    Her Allergies Are:  Allergies  Allergen Reactions  . Miconazole Nitrate Itching  . Sulfonamide  Derivatives Rash  :   Her Current Medications Are:  Outpatient Encounter Prescriptions as of 02/24/2013  Medication Sig Dispense Refill  . amitriptyline (ELAVIL) 75 MG tablet Take 1 tablet (75 mg total) by mouth at bedtime.  90 tablet  0  . atomoxetine (STRATTERA) 40 MG capsule Take 1 capsule (40 mg total) by mouth daily.  90 capsule  0  . Coconut Oil 1000 MG CAPS Take 1 capsule by mouth 2 (two) times daily.      . COD LIVER OIL PO Take 1 capsule by mouth daily. Has flax seed in it      . dicyclomine (BENTYL) 20 MG tablet Take 20 mg by mouth every 6 (six) hours.      . diphenhydrAMINE (BENADRYL) 25 mg capsule Take 25 mg by mouth at bedtime.      [name] doxycycline (VIBRAMYCIN) 100 MG capsule       . fluticasone (VERAMYST) 27.5 MCG/SPRAY nasal spray Place 2 sprays into the nose daily.      . furosemide (LASIX) 40 MG tablet Take 40 mg by mouth 2 (two) times daily.      . Garlic 1000 MG CAPS Take 1 capsule by mouth daily.      [name] HORSE CHESTNUT PO Take 200 mg by mouth 2 (two) times daily.      . Iodine, Kelp, (KELP PO) Take 100 mg by mouth daily.      [name] lamoTRIgine (LAMICTAL) 150 MG tablet Take 1 tablet (150 mg total) by mouth daily.  90 tablet  0  . meclizine (ANTIVERT) 25 MG tablet       . Melatonin 3 MG TABS Take 3 mg by mouth.      . Multiple Vitamins-Minerals (MULTIVITAMIN WITH MINERALS) tablet Take 1 tablet by mouth daily.      . naproxen (NAPROSYN) 500 MG tablet       . omeprazole (PRILOSEC) 40 MG capsule Take 40 mg by mouth daily.      . pravastatin (PRAVACHOL) 40 MG tablet Take 40 mg by mouth daily.      . Probiotic Product (PROBIOTIC & ACIDOPHILUS EX ST) CAPS Take 1 capsule by mouth daily.      . promethazine (PHENERGAN) 25 MG tablet Take 25 mg by mouth 2 (two) times daily.       . VENTOLIN HFA 108 (90 BASE) MCG/ACT inhaler       . Vinpocetine POWD Take 10 mg by mouth daily.      [name] acetaminophen-codeine (TYLENOL #3) 300-30 MG per tablet Take 1-2 tablets by mouth every 6 (six) hours as  needed for pain.  15 tablet  0  . [DISCONTINUED] fluticasone (FLONASE) 50 MCG/ACT nasal spray        Facility-Administered Encounter Medications as of 02/24/2013  Medication Dose Route Frequency Provider Last Rate Last Dose  . 0.9 %  sodium chloride infusion  500 mL Intravenous Continuous Rachael Fee, MD      : Review of Systems  Constitutional: Positive for unexpected weight change (gain).  HENT: Positive for trouble swallowing.   Eyes: Positive for visual disturbance (blurred vision).  Respiratory:       Snoring  Cardiovascular: Positive for leg swelling.  Musculoskeletal: Positive for joint swelling and arthralgias.  Allergic/Immunologic: Positive for environmental allergies.  Neurological: Positive for dizziness.    Objective:  Neurologic Exam  Physical Exam Physical Examination:   Filed Vitals:   02/24/13 1419  BP: 141/84  Pulse: 90  Temp: 98.8 F (37.1 C)    General Examination: The patient is a very pleasant 57 y.o. female in no acute distress. She appears well-developed and well-nourished and adequately groomed.   HEENT: Normocephalic, atraumatic, pupils are equal, round and reactive to light and accommodation. Extraocular tracking is good without limitation to gaze excursion or nystagmus noted. She has no evidence of nystagmus or vertiginous symptoms upon sudden changes in head position passively. Normal smooth pursuit is noted. Hearing is grossly intact. Tympanic membranes are clear bilaterally. Face is symmetric with normal facial animation and normal facial sensation. Speech is clear with no dysarthria noted. There is no hypophonia. There is no lip, neck or jaw tremor. Neck is supple with full range of motion. There are no carotid bruits on auscultation. Oropharynx exam reveals: adequate dental hygiene and moderate airway crowding, due to redundant soft palate. Mallampati is class II. Tongue protrudes centrally and palate elevates symmetrically. Mild mouth dryness is  noted. She is missing one tooth and says that the crown fell off and she is waiting for it to be fixed.  Chest: Clear to auscultation without wheezing, rhonchi or crackles noted.  Heart: S1+S2+0, regular and normal without murmurs, rubs or gallops noted.   Abdomen: Soft, non-tender and non-distended with normal bowel sounds appreciated on auscultation.  Extremities: There is 3+ pitting edema in the distal lower extremities bilaterally, up to the midcalf area.   Skin: Warm and dry without trophic changes noted.   Musculoskeletal: exam reveals no obvious joint deformities, tenderness or joint swelling or erythema.   Neurologically:  Mental status: The patient is awake, alert and oriented in all 4 spheres. Her memory, attention, language and knowledge are appropriate. There is no aphasia, agnosia, apraxia or anomia. Speech is clear with normal prosody and enunciation. Thought process is linear. Mood is congruent and affect is normal.  Cranial nerves are as described above under HEENT exam. In addition, shoulder shrug is normal with equal shoulder height noted. Motor exam: Normal bulk, strength and tone is noted in the upper extremities. In the lower extremities she has 4+ out of 5 strength in the left lower extremity and 4 minus out of 5 strength in the right lower extremity. She has hyperreflexia in the knees bilaterally but absent ankle jerks, but most likely due to her swelling. There is no drift, tremor or rebound. Reflexes are 2+ in the upper extremities. Fine motor skills are intact with normal finger taps, normal hand movements, normal rapid alternating patting, but foot tapping and foot agility is difficult primarily due to weakness and swelling.  Cerebellar testing shows no dysmetria or intention tremor on finger to nose testing. There is no truncal ataxia. I did not have her stand or walk for me as she did not bring her walker. Sensory exam is  intact to light touch, pinprick, vibration,  temperature sense and proprioception in the upper extremities.   Assessment and Plan:   Assessment and Plan:  In summary, SHERLONDA FLATER is a very pleasant 57 y.o.-year old female with a history of cerebral palsy, acromegaly, lower extremity edema, and multiple other medical problems who presents with new onset of vertigo. This most likely paroxysmal positional vertigo. I explained this diagnosis to her. If this persists I will send her to physical therapy. I explained to her that there is no specific medication for this. She may need HEENT evaluation as well. At this juncture, I suggested an MRI and MRA of the brain. She was in agreement. I would like to see her back in 3 months, sooner if the need arises. She is strongly advised to address her severe lower extremity edema with you. She has not been taking her Lasix regularly.  Thank you very much for allowing me to participate in the care of this nice patient. If I can be of any further assistance to you please do not hesitate to call me at [phone].  Sincerely,   Huston Foley, MD, PhD

## 2013-03-10 ENCOUNTER — Ambulatory Visit
Admission: RE | Admit: 2013-03-10 | Discharge: 2013-03-10 | Disposition: A | Discharge status: Home / Self Care | Payer: PRIVATE HEALTH INSURANCE | Source: Ambulatory Visit | Attending: Neurology | Admitting: Neurology

## 2013-03-10 DIAGNOSIS — R51 Headache: Secondary | ICD-10-CM

## 2013-03-10 DIAGNOSIS — H8110 Benign paroxysmal vertigo, unspecified ear: Secondary | ICD-10-CM

## 2013-03-10 DIAGNOSIS — R42 Dizziness and giddiness: Secondary | ICD-10-CM

## 2013-03-10 MED ORDER — GADOBENATE DIMEGLUMINE 529 MG/ML IV SOLN
15.0000 mL | Freq: Once | INTRAVENOUS | Status: AC | PRN
  Administered 2013-03-10: 15 mL via INTRAVENOUS

## 2013-03-13 NOTE — Progress Notes (Signed)
Quick Note:  Please call and advise the patient that the recent scans we did were within normal limits. We did a brain MRI and MRA (blood vessel imaging of the head) and while the blood vessel in the brain look normal, there were few very small white matter changes in the brain. These are non-specific and can be seen with normal ageing, chronic hypertension, chronic hyperlipidemia, chronic diabetes or a history of headaches. There was no stroke or mass or shrinkage. No further action is required on this test at this time. Please remind patient to keep any upcoming appointments or tests and to call us with any interim questions, concerns, problems or updates. Thanks,  Huston Foley, MD, PhD   ______

## 2013-03-13 NOTE — Progress Notes (Signed)
Quick Note:  Spoke with patient and relayed the results of their MRI and MRA as well as Dr Athar's advise or recommendations. The patient was also reminded of any future appointments. The patient understood and had no questions.  ______ 

## 2013-03-16 ENCOUNTER — Encounter (HOSPITAL_COMMUNITY): Payer: Self-pay | Admitting: Psychiatry

## 2013-03-16 ENCOUNTER — Ambulatory Visit (INDEPENDENT_AMBULATORY_CARE_PROVIDER_SITE_OTHER): Payer: Medicaid Other | Admitting: Psychiatry

## 2013-03-16 VITALS — BP 135/80 | HR 78 | Ht 67.0 in | Wt 230.0 lb

## 2013-03-16 DIAGNOSIS — F319 Bipolar disorder, unspecified: Secondary | ICD-10-CM

## 2013-03-16 MED ORDER — AMITRIPTYLINE HCL 75 MG PO TABS: 75.0000 mg | ORAL_TABLET | Freq: Every day | ORAL | Status: DC

## 2013-03-16 MED ORDER — LAMOTRIGINE 150 MG PO TABS: 150.0000 mg | ORAL_TABLET | Freq: Every day | ORAL | Status: DC

## 2013-03-16 MED ORDER — ATOMOXETINE HCL 40 MG PO CAPS: 40.0000 mg | ORAL_CAPSULE | Freq: Every day | ORAL | Status: DC
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
## 2013-03-24 ENCOUNTER — Other Ambulatory Visit (HOSPITAL_COMMUNITY): Payer: Self-pay | Admitting: *Deleted

## 2013-04-14 ENCOUNTER — Emergency Department (INDEPENDENT_AMBULATORY_CARE_PROVIDER_SITE_OTHER)
Admission: EM | Admit: 2013-04-14 | Discharge: 2013-04-14 | Disposition: A | Discharge status: Home / Self Care | Payer: PRIVATE HEALTH INSURANCE | Source: Home / Self Care

## 2013-04-14 ENCOUNTER — Encounter (HOSPITAL_COMMUNITY): Payer: Self-pay | Admitting: Emergency Medicine

## 2013-04-14 DIAGNOSIS — H1010 Acute atopic conjunctivitis, unspecified eye: Secondary | ICD-10-CM

## 2013-04-14 DIAGNOSIS — H1013 Acute atopic conjunctivitis, bilateral: Secondary | ICD-10-CM

## 2013-04-14 MED ORDER — ERYTHROMYCIN 5 MG/GM OP OINT: TOPICAL_OINTMENT | OPHTHALMIC | Status: DC

## 2013-04-14 MED ORDER — DEXAMETHASONE 0.1 % OP SUSP: 1.0000 [drp] | Freq: Two times a day (BID) | OPHTHALMIC | Status: DC

## 2013-04-14 NOTE — ED Provider Notes (Signed)
History     CSN: [id number]  Arrival date & time 04/14/13  1657   None     Chief Complaint  Patient presents with  . Eye Drainage    bilateral eye drainage and redness. onset x last night and gradually getting worse.     (Consider location/radiation/quality/duration/timing/severity/associated sxs/prior treatment) HPI Comments: 57 year old female is complaining of red, watery and sore eyes. It started approximately 2 weeks ago when she utilized OTC eyedrops for allergies and she improved somewhat. Last night her symptoms return and she has developed erythema and mild swelling of the eyelids with thick, watery drainage. Denies  foreign body sensation or trauma to the eyes. Denies known chemical contact. Has a history of seasonal allergies and conjunctivitis.   Past Medical History  Diagnosis Date  . Anxiety   . High cholesterol   . Cerebral palsy   . Borderline diabetes   . Melanoma in situ   . Dysplasia of cervix   . Neck mass   . IBS (irritable bowel syndrome)   . Acromegaly   . Headache(784.0)   . Bladder infection   . Colitis   . History of chicken pox   . History of mumps   . History of measles   . Yeast infection   . Bacterial infection   . Trichomonas   . Ovarian cyst   . Depression   . Arthritis   . GERD (gastroesophageal reflux disease)   . Kidney infection   . Benign paroxysmal positional vertigo 02/24/2013    Past Surgical History  Procedure Laterality Date  . Leg tendon surgery    . Knee rotation    . Rotator cuff surgery    . Appendectomy    . Carpal tunnel release    . Ovarian cyst removal    . Replacement total knee bilateral  2006    Family History  Problem Relation Age of Onset  . Suicidality Father   . Depression Father   . Alcohol abuse Father   . Drug abuse Brother   . Alcohol abuse Brother   . Esophageal cancer Brother   . Depression Maternal Aunt   . Alcohol abuse Brother   . Anxiety disorder Daughter   . Depression Daughter    . Suicidality Daughter   . Alcohol abuse Daughter   . Drug abuse Son   . Alcohol abuse Son     History  Substance Use Topics  . Smoking status: Former Smoker -- 31 years    Types: Cigarettes    Quit date: 11/18/1999  . Smokeless tobacco: Never Used  . Alcohol Use: No    OB History   Grav Para Term Preterm Abortions TAB SAB Ect Mult Living   3 2              Review of Systems  Constitutional: Negative.   HENT: Positive for rhinorrhea and postnasal drip. Negative for sore throat, neck pain, neck stiffness and ear discharge.   Eyes: Positive for discharge, redness and itching. Negative for visual disturbance.  Respiratory: Negative.   Gastrointestinal: Negative.   Allergic/Immunologic: Positive for environmental allergies.  Hematological: Negative.     Allergies  Miconazole nitrate and Sulfonamide derivatives  Home Medications   Current Outpatient Rx  Name  Route  Sig  Dispense  Refill  . acetaminophen-codeine (TYLENOL #3) 300-30 MG per tablet   Oral   Take 1-2 tablets by mouth every 6 (six) hours as needed for pain.   15 tablet  0   . amitriptyline (ELAVIL) 75 MG tablet   Oral   Take 1 tablet (75 mg total) by mouth at bedtime.   90 tablet   0   . atomoxetine (STRATTERA) 40 MG capsule   Oral   Take 1 capsule (40 mg total) by mouth daily.   90 capsule   0   . Coconut Oil 1000 MG CAPS   Oral   Take 1 capsule by mouth 2 (two) times daily.         . COD LIVER OIL PO   Oral   Take 1 capsule by mouth daily. Has flax seed in it         . dicyclomine (BENTYL) 20 MG tablet   Oral   Take 20 mg by mouth every 6 (six) hours.         . diphenhydrAMINE (BENADRYL) 25 mg capsule   Oral   Take 25 mg by mouth at bedtime.         [name] doxycycline (VIBRAMYCIN) 100 MG capsule               . fluticasone (VERAMYST) 27.5 MCG/SPRAY nasal spray   Nasal   Place 2 sprays into the nose daily.         . furosemide (LASIX) 40 MG tablet   Oral   Take 40 mg  by mouth 2 (two) times daily.         . Garlic 1000 MG CAPS   Oral   Take 1 capsule by mouth daily.         [name] HORSE CHESTNUT PO   Oral   Take 200 mg by mouth 2 (two) times daily.         . Iodine, Kelp, (KELP PO)   Oral   Take 100 mg by mouth daily.         [name] lamoTRIgine (LAMICTAL) 150 MG tablet   Oral   Take 1 tablet (150 mg total) by mouth daily.   90 tablet   0   . meclizine (ANTIVERT) 25 MG tablet               . Melatonin 3 MG TABS   Oral   Take 3 mg by mouth.         . Multiple Vitamins-Minerals (MULTIVITAMIN WITH MINERALS) tablet   Oral   Take 1 tablet by mouth daily.         . naproxen (NAPROSYN) 500 MG tablet               . omeprazole (PRILOSEC) 40 MG capsule   Oral   Take 40 mg by mouth daily.         . pravastatin (PRAVACHOL) 40 MG tablet   Oral   Take 40 mg by mouth daily.         . Probiotic Product (PROBIOTIC & ACIDOPHILUS EX ST) CAPS   Oral   Take 1 capsule by mouth daily.         . promethazine (PHENERGAN) 25 MG tablet   Oral   Take 25 mg by mouth 2 (two) times daily.          . VENTOLIN HFA 108 (90 BASE) MCG/ACT inhaler               . Vinpocetine POWD   Oral   Take 10 mg by mouth daily.         [name] dexamethasone (DECADRON) 0.1 % ophthalmic suspension  Both Eyes   Place 1 drop into both eyes 2 (two) times daily.   15 mL   0   . erythromycin ophthalmic ointment      Place a 1/2 inch ribbon of ointment into the lower eyelid of both eyes bid   1 g   0     BP 130/79  Pulse 93  Temp(Src) 98.2 F (36.8 C) (Oral)  Resp 16  SpO2 97%  Physical Exam  Nursing note and vitals reviewed. Constitutional: She is oriented to person, place, and time. She appears well-developed and well-nourished. No distress.  HENT:  Head: Normocephalic and atraumatic.  Oropharynx with mild erythema and clear PND. No exudates.  Eyes: EOM are normal. Pupils are equal, round, and reactive to light.  Conjunctiva with minor  swelling and edema to the upper and lower lids. Positive for clear mucoid drainage. No purulence. Sclera mildly injected. Anterior chamber is clear.  Neck: Normal range of motion. Neck supple.  Cardiovascular: Normal rate.   Pulmonary/Chest: Effort normal. No respiratory distress.  Lymphadenopathy:    She has no cervical adenopathy.  Neurological: She is alert and oriented to person, place, and time.  Skin: Skin is warm.  Psychiatric: She has a normal mood and affect.    ED Course  Procedures (including critical care time)  Labs Reviewed - No data to display No results found.   1. Allergic conjunctivitis, acute, bilateral       MDM  Decadron ophthalmic solution one drop in each eye twice a day Erythromycin ointment 1/2 inch to lower lid of each eye twice a day Continue with your usual allergy medication. Followup with your primary care doctor as needed.        Hayden Rasmussen, NP 04/14/13 1745

## 2013-04-14 NOTE — ED Provider Notes (Signed)
Medical screening examination/treatment/procedure(s) were performed by resident physician or non-physician practitioner and as supervising physician I was immediately available for consultation/collaboration.   Ahmani Prehn DOUGLAS MD.   Sherrol Vicars D Mauria Asquith, MD 04/14/13 1802 

## 2013-04-14 NOTE — ED Notes (Signed)
Pt c/o bilateral eye redness and drainage that started last night and is gradually getting worse Denies pain but having irritation with thick mucus drainage.  Pt has tried otc allergy meds with no relief

## 2013-06-21 ENCOUNTER — Other Ambulatory Visit (HOSPITAL_COMMUNITY): Payer: Self-pay | Admitting: Psychiatry

## 2013-06-23 ENCOUNTER — Encounter (HOSPITAL_COMMUNITY): Payer: Self-pay | Admitting: Psychiatry

## 2013-06-23 ENCOUNTER — Ambulatory Visit (INDEPENDENT_AMBULATORY_CARE_PROVIDER_SITE_OTHER): Payer: PRIVATE HEALTH INSURANCE | Admitting: Psychiatry

## 2013-06-23 VITALS — BP 112/78 | HR 84 | Ht 67.0 in

## 2013-06-23 DIAGNOSIS — F319 Bipolar disorder, unspecified: Secondary | ICD-10-CM

## 2013-06-23 MED ORDER — AMITRIPTYLINE HCL 75 MG PO TABS: 75.0000 mg | ORAL_TABLET | Freq: Every day | ORAL | Status: DC

## 2013-06-23 MED ORDER — ATOMOXETINE HCL 40 MG PO CAPS: 40.0000 mg | ORAL_CAPSULE | Freq: Every day | ORAL | Status: DC

## 2013-06-23 MED ORDER — LAMOTRIGINE 150 MG PO TABS: 150.0000 mg | ORAL_TABLET | Freq: Every day | ORAL | Status: DC
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
## 2013-07-07 ENCOUNTER — Encounter (HOSPITAL_COMMUNITY): Payer: Self-pay | Admitting: *Deleted

## 2013-07-07 ENCOUNTER — Emergency Department (HOSPITAL_COMMUNITY): Payer: PRIVATE HEALTH INSURANCE

## 2013-07-07 ENCOUNTER — Other Ambulatory Visit: Payer: Self-pay

## 2013-07-07 ENCOUNTER — Observation Stay (HOSPITAL_COMMUNITY)
Admission: EM | Admit: 2013-07-07 | Discharge: 2013-07-08 | Disposition: A | Discharge status: Home / Self Care | Payer: PRIVATE HEALTH INSURANCE | Attending: Internal Medicine | Admitting: Internal Medicine

## 2013-07-07 DIAGNOSIS — R52 Pain, unspecified: Secondary | ICD-10-CM | POA: Insufficient documentation

## 2013-07-07 DIAGNOSIS — F329 Major depressive disorder, single episode, unspecified: Secondary | ICD-10-CM

## 2013-07-07 DIAGNOSIS — K589 Irritable bowel syndrome without diarrhea: Secondary | ICD-10-CM

## 2013-07-07 DIAGNOSIS — J438 Other emphysema: Secondary | ICD-10-CM | POA: Diagnosis present

## 2013-07-07 DIAGNOSIS — G809 Cerebral palsy, unspecified: Secondary | ICD-10-CM | POA: Diagnosis present

## 2013-07-07 DIAGNOSIS — Z87891 Personal history of nicotine dependence: Secondary | ICD-10-CM | POA: Insufficient documentation

## 2013-07-07 DIAGNOSIS — F411 Generalized anxiety disorder: Secondary | ICD-10-CM | POA: Insufficient documentation

## 2013-07-07 DIAGNOSIS — G4734 Idiopathic sleep related nonobstructive alveolar hypoventilation: Secondary | ICD-10-CM | POA: Diagnosis present

## 2013-07-07 DIAGNOSIS — F319 Bipolar disorder, unspecified: Secondary | ICD-10-CM

## 2013-07-07 DIAGNOSIS — E785 Hyperlipidemia, unspecified: Secondary | ICD-10-CM

## 2013-07-07 DIAGNOSIS — Z993 Dependence on wheelchair: Secondary | ICD-10-CM | POA: Insufficient documentation

## 2013-07-07 DIAGNOSIS — E876 Hypokalemia: Secondary | ICD-10-CM | POA: Diagnosis present

## 2013-07-07 DIAGNOSIS — I10 Essential (primary) hypertension: Secondary | ICD-10-CM

## 2013-07-07 DIAGNOSIS — Z8719 Personal history of other diseases of the digestive system: Secondary | ICD-10-CM

## 2013-07-07 DIAGNOSIS — K219 Gastro-esophageal reflux disease without esophagitis: Secondary | ICD-10-CM

## 2013-07-07 DIAGNOSIS — R0902 Hypoxemia: Principal | ICD-10-CM

## 2013-07-07 DIAGNOSIS — R1031 Right lower quadrant pain: Secondary | ICD-10-CM | POA: Diagnosis present

## 2013-07-07 DIAGNOSIS — N390 Urinary tract infection, site not specified: Secondary | ICD-10-CM | POA: Diagnosis present

## 2013-07-07 DIAGNOSIS — F3289 Other specified depressive episodes: Secondary | ICD-10-CM

## 2013-07-07 LAB — URINALYSIS, ROUTINE W REFLEX MICROSCOPIC
Nitrite: POSITIVE — AB
Protein, ur: NEGATIVE mg/dL
Urobilinogen, UA: 1 mg/dL (ref 0.0–1.0)

## 2013-07-07 LAB — BLOOD GAS, ARTERIAL
Drawn by: 103701
O2 Content: 2 L/min
O2 Saturation: 95.8 %
pCO2 arterial: 47.7 mmHg — ABNORMAL HIGH (ref 35.0–45.0)
pO2, Arterial: 81.3 mmHg (ref 80.0–100.0)

## 2013-07-07 LAB — CBC WITH DIFFERENTIAL/PLATELET
Basophils Absolute: 0 10*3/uL (ref 0.0–0.1)
Basophils Relative: 0 % (ref 0–1)
Eosinophils Absolute: 0.1 10*3/uL (ref 0.0–0.7)
Lymphs Abs: 3 10*3/uL (ref 0.7–4.0)
MCH: 28.4 pg (ref 26.0–34.0)
Neutrophils Relative %: 69 % (ref 43–77)
Platelets: 257 10*3/uL (ref 150–400)
RBC: 5.59 MIL/uL — ABNORMAL HIGH (ref 3.87–5.11)
RDW: 13.5 % (ref 11.5–15.5)

## 2013-07-07 LAB — BASIC METABOLIC PANEL
Calcium: 9.7 mg/dL (ref 8.4–10.5)
GFR calc non Af Amer: >90 mL/min (ref >90)
Sodium: 135 mEq/L (ref 135–145)

## 2013-07-07 LAB — HEPATIC FUNCTION PANEL
Albumin: 3.9 g/dL (ref 3.5–5.2)
Indirect Bilirubin: 0.4 mg/dL (ref 0.3–0.9)
Total Protein: 7.3 g/dL (ref 6.0–8.3)

## 2013-07-07 LAB — D-DIMER, QUANTITATIVE: D-Dimer, Quant: >20 ug/mL-FEU — ABNORMAL HIGH (ref 0.00–0.48)

## 2013-07-07 LAB — POCT I-STAT TROPONIN I

## 2013-07-07 MED ORDER — SODIUM CHLORIDE 0.9 % IV BOLUS (SEPSIS)
500.0000 mL | Freq: Once | INTRAVENOUS | Status: AC
  Administered 2013-07-07: 500 mL via INTRAVENOUS

## 2013-07-07 MED ORDER — SODIUM CHLORIDE 0.9 % IV SOLN
Freq: Once | INTRAVENOUS | Status: AC
  Administered 2013-07-07: 18:00:00 via INTRAVENOUS

## 2013-07-07 MED ORDER — ALBUTEROL SULFATE (5 MG/ML) 0.5% IN NEBU
5.0000 mg | INHALATION_SOLUTION | Freq: Once | RESPIRATORY_TRACT | Status: AC
  Administered 2013-07-07: 5 mg via RESPIRATORY_TRACT
  Filled 2013-07-07: qty 1

## 2013-07-07 MED ORDER — IOHEXOL 300 MG/ML  SOLN
50.0000 mL | Freq: Once | INTRAMUSCULAR | Status: AC | PRN
  Administered 2013-07-07: 50 mL via ORAL

## 2013-07-07 MED ORDER — METHYLPREDNISOLONE SODIUM SUCC 125 MG IJ SOLR
125.0000 mg | Freq: Once | INTRAMUSCULAR | Status: AC
  Administered 2013-07-07: 125 mg via INTRAVENOUS
  Filled 2013-07-07: qty 2

## 2013-07-07 MED ORDER — POTASSIUM CHLORIDE CRYS ER 20 MEQ PO TBCR
40.0000 meq | EXTENDED_RELEASE_TABLET | Freq: Once | ORAL | Status: AC
  Administered 2013-07-07: 40 meq via ORAL
  Filled 2013-07-07: qty 2

## 2013-07-07 MED ORDER — HYDROMORPHONE HCL PF 1 MG/ML IJ SOLN: 1.0000 mg | INTRAMUSCULAR | Status: DC | PRN

## 2013-07-07 MED ORDER — ALBUTEROL SULFATE (5 MG/ML) 0.5% IN NEBU
2.5000 mg | INHALATION_SOLUTION | RESPIRATORY_TRACT | Status: DC
  Administered 2013-07-07: 2.5 mg via RESPIRATORY_TRACT
  Filled 2013-07-07: qty 0.5

## 2013-07-07 MED ORDER — CEFTRIAXONE SODIUM 1 G IJ SOLR
1.0000 g | Freq: Once | INTRAMUSCULAR | Status: AC
  Administered 2013-07-07: 1 g via INTRAMUSCULAR
  Filled 2013-07-07: qty 10

## 2013-07-07 MED ORDER — MORPHINE SULFATE 4 MG/ML IJ SOLN
6.0000 mg | Freq: Once | INTRAMUSCULAR | Status: AC
  Administered 2013-07-07: 6 mg via INTRAVENOUS
  Filled 2013-07-07: qty 2

## 2013-07-07 MED ORDER — ONDANSETRON HCL 4 MG/2ML IJ SOLN
4.0000 mg | Freq: Once | INTRAMUSCULAR | Status: AC
  Administered 2013-07-07: 4 mg via INTRAVENOUS
  Filled 2013-07-07: qty 2

## 2013-07-07 MED ORDER — ONDANSETRON HCL 4 MG/2ML IJ SOLN: 4.0000 mg | Freq: Three times a day (TID) | INTRAMUSCULAR | Status: DC | PRN

## 2013-07-07 MED ORDER — IOHEXOL 350 MG/ML SOLN
100.0000 mL | Freq: Once | INTRAVENOUS | Status: AC | PRN
  Administered 2013-07-07: 100 mL via INTRAVENOUS

## 2013-07-07 NOTE — ED Notes (Signed)
Bed: WA22 Expected date:  Expected time:  Means of arrival:  Comments: ems 

## 2013-07-07 NOTE — H&P (Signed)
PCP:   Katherine Ravel, MD   Chief Complaint:  Anxiety, sob  HPI: 57 yo female h/o cerebral palsy wheelchair bound, anxiety, ??copd (shes not sure if this is accurate), ibs comes in after calling EMS for hyperventilating and feeling anxious.  She was also having some rlq abd pain at the time.  Her brother just died last month, since then her anxiety attacks have become more frequent, but she feels is normal for her grieving.  She denies any fevers.  No cough.  No le edema or swelling.  In ED she has had several episodes where her oxygen sats drop to mid 80's on RA, pt is asymptomatic of this.  During my interview, after taking off her nasal canula, her oxygen sats stayed above 92% on RA while speaking in full sentences conversing with me for over 10 minutes.  She denies chest pain.  No sob at this time.  Review of Systems:  Positive and negative as per HPI otherwise all other systems are negative  Past Medical History: Past Medical History  Diagnosis Date  . Anxiety   . High cholesterol   . Cerebral palsy   . Borderline diabetes   . Melanoma in situ   . Dysplasia of cervix   . Neck mass   . IBS (irritable bowel syndrome)   . Acromegaly   . Headache(784.0)   . Bladder infection   . Colitis   . History of chicken pox   . History of mumps   . History of measles   . Yeast infection   . Bacterial infection   . Trichomonas   . Ovarian cyst   . Depression   . Arthritis   . GERD (gastroesophageal reflux disease)   . Kidney infection   . Benign paroxysmal positional vertigo 02/24/2013   Past Surgical History  Procedure Laterality Date  . Leg tendon surgery    . Knee rotation    . Rotator cuff surgery    . Appendectomy    . Carpal tunnel release    . Ovarian cyst removal    . Replacement total knee bilateral  2006    Medications: Prior to Admission medications   Medication Sig Start Date End Date Taking? Authorizing Provider  amitriptyline (ELAVIL) 75 MG tablet Take 1  tablet (75 mg total) by mouth at bedtime. 06/23/13  Yes Cleotis Nipper, MD  atomoxetine (STRATTERA) 40 MG capsule Take 1 capsule (40 mg total) by mouth daily. 06/23/13  Yes Cleotis Nipper, MD  calcium-vitamin D (OSCAL WITH D) 500-200 MG-UNIT per tablet Take 1 tablet by mouth daily with breakfast.   Yes Historical Provider, MD  dicyclomine (BENTYL) 20 MG tablet Take 20 mg by mouth 2 (two) times daily as needed (for IBS).    Yes Historical Provider, MD  diphenhydrAMINE (BENADRYL) 25 mg capsule Take 25 mg by mouth at bedtime.   Yes Historical Provider, MD  fluticasone (VERAMYST) 27.5 MCG/SPRAY nasal spray Place 2 sprays into the nose daily.   Yes Historical Provider, MD  furosemide (LASIX) 40 MG tablet Take 40 mg by mouth daily.    Yes Historical Provider, MD  lamoTRIgine (LAMICTAL) 150 MG tablet Take 1 tablet (150 mg total) by mouth daily. 06/23/13  Yes Cleotis Nipper, MD  Magnesium Oxide 250 MG TABS Take 1 tablet by mouth daily.   Yes Historical Provider, MD  Melatonin 3 MG TABS Take 3 mg by mouth at bedtime.    Yes Historical Provider, MD  Multiple Vitamins-Minerals (  MULTIVITAMIN WITH MINERALS) tablet Take 1 tablet by mouth daily.   Yes Historical Provider, MD  omeprazole (PRILOSEC) 40 MG capsule Take 40 mg by mouth 2 (two) times daily.    Yes Historical Provider, MD  OVER THE COUNTER MEDICATION Take 1 capsule by mouth daily. Garcinia Cambogia 500mg  tablet   Yes Historical Provider, MD  pravastatin (PRAVACHOL) 40 MG tablet Take 40 mg by mouth daily.   Yes Historical Provider, MD  Probiotic Product (PROBIOTIC & ACIDOPHILUS EX ST) CAPS Take 1 capsule by mouth daily.   Yes Historical Provider, MD    Allergies:   Allergies  Allergen Reactions  . Miconazole Nitrate Itching  . Sulfonamide Derivatives Rash    Social History:  reports that she quit smoking about 13 years ago. Her smoking use included Cigarettes. She smoked 0.00 packs per day for 31 years. She has never used smokeless tobacco. She reports  that she does not drink alcohol or use illicit drugs.  Family History: Family History  Problem Relation Age of Onset  . Suicidality Father   . Depression Father   . Alcohol abuse Father   . Drug abuse Brother   . Alcohol abuse Brother   . Esophageal cancer Brother   . Depression Maternal Aunt   . Alcohol abuse Brother   . Anxiety disorder Daughter   . Depression Daughter   . Suicidality Daughter   . Alcohol abuse Daughter   . Drug abuse Son   . Alcohol abuse Son     Physical Exam: Filed Vitals:   07/07/13 1530 07/07/13 2100 07/07/13 2148  BP: 119/69  120/59  Pulse: 95  97  Temp: 98 F (36.7 C)  97.5 F (36.4 C)  TempSrc: Oral  Oral  Resp: 20  14  SpO2: 96% 93% 94%   General appearance: alert, cooperative and no distress Head: Normocephalic, without obvious abnormality, atraumatic Eyes: negative Neck: no JVD and supple, symmetrical, trachea midline Lungs: clear to auscultation bilaterally Heart: regular rate and rhythm, S1, S2 normal, no murmur, click, rub or gallop Abdomen: soft, non-tender; bowel sounds normal; no masses,  no organomegaly Extremities: extremities normal, atraumatic, no cyanosis or edema Pulses: 2+ and symmetric Skin: Skin color, texture, turgor normal. No rashes or lesions Neurologic: Grossly normal    Labs on Admission:   Recent Labs  07/07/13 1536  NA 135  K 2.5*  CL 92*  CO2 31  GLUCOSE 182*  BUN 12  CREATININE 0.60  CALCIUM 9.7    Recent Labs  07/07/13 1536  AST 49*  ALT 69*  ALKPHOS 107  BILITOT 0.5  PROT 7.3  ALBUMIN 3.9    Recent Labs  07/07/13 1536  LIPASE 21    Recent Labs  07/07/13 1536  WBC 13.3*  NEUTROABS 9.1*  HGB 15.9*  HCT 47.0*  MCV 84.1  PLT 257    Radiological Exams on Admission: Dg Chest 2 View  07/07/2013   *RADIOLOGY REPORT*  Clinical Data: Shortness of breath  CHEST - 2 VIEW  Comparison: 01/23/2008  Findings: The cardiac shadow is within normal limits.  The lungs are clear bilaterally.   No acute bony abnormality is seen.  IMPRESSION: No acute abnormality noted.   Original Report Authenticated By: Alcide Clever, M.D.   Ct Angio Chest Pe W/cm &/or Wo Cm  07/07/2013   CLINICAL DATA:  Chest and abdominal pain. Shortness of breath and Hypoxia.  EXAM: CT ANGIOGRAPHY CHEST  CT ABDOMEN AND PELVIS WITH CONTRAST  TECHNIQUE: Multidetector CT imaging  of the chest was performed using the standard protocol during bolus administration of intravenous contrast. Multiplanar CT image reconstructions including MIPs were obtained to evaluate the vascular anatomy. Multidetector CT imaging of the abdomen and pelvis was performed using the standard protocol during bolus administration of intravenous contrast.  CONTRAST:  OMNIPAQUE IOHEXOL 350 MG/ML SOLN  COMPARISON:  Chest CT on 05/12/2006  FINDINGS: CTA CHEST FINDINGS  Satisfactory opacification of pulmonary arteries noted, and there is no evidence of pulmonary emboli. No evidence of thoracic aortic dissection or aneurysm. No evidence of mediastinal hematoma or mass. The no adenopathy identified within the thorax.  No evidence of pleural or pericardial effusion. Mild bibasilar scarring noted. Moderate emphysema noted. No evidence of pulmonary infiltrate or central endobronchial obstruction. No suspicious pulmonary nodules or masses identified.  Review of the MIP images confirms the above findings.  CT ABDOMEN PELVIS FINDINGS  No evidence of abdominal aorta or iliac artery dissection or aneurysm. Hepatic steatosis again noted, however no liver masses are identified. The gallbladder, pancreas, spleen, adrenal glands, and kidneys are normal in appearance. No evidence of hydronephrosis.  Uterus and adnexal regions are unremarkable. No soft tissue masses or lymphadenopathy identified. No evidence of inflammatory process or abnormal fluid collections. No evidence of dilated bowel loops or hernia. No suspicious bone lesions identified.  Review of the MIP images confirms  the above findings.  IMPRESSION: CTA CHEST IMPRESSION  No evidence of pulmonary embolism or other acute findings.  Moderate emphysema and bibasilar scarring.  CT ABDOMEN PELVIS IMPRESSION  No acute findings.  Hepatic steatosis.   Electronically Signed   By: Myles Rosenthal   On: 07/07/2013 20:04   Ct Abdomen Pelvis W Contrast  07/07/2013   CLINICAL DATA:  Chest and abdominal pain. Shortness of breath and Hypoxia.  EXAM: CT ANGIOGRAPHY CHEST  CT ABDOMEN AND PELVIS WITH CONTRAST  TECHNIQUE: Multidetector CT imaging of the chest was performed using the standard protocol during bolus administration of intravenous contrast. Multiplanar CT image reconstructions including MIPs were obtained to evaluate the vascular anatomy. Multidetector CT imaging of the abdomen and pelvis was performed using the standard protocol during bolus administration of intravenous contrast.  CONTRAST:  OMNIPAQUE IOHEXOL 350 MG/ML SOLN  COMPARISON:  Chest CT on 05/12/2006  FINDINGS: CTA CHEST FINDINGS  Satisfactory opacification of pulmonary arteries noted, and there is no evidence of pulmonary emboli. No evidence of thoracic aortic dissection or aneurysm. No evidence of mediastinal hematoma or mass. The no adenopathy identified within the thorax.  No evidence of pleural or pericardial effusion. Mild bibasilar scarring noted. Moderate emphysema noted. No evidence of pulmonary infiltrate or central endobronchial obstruction. No suspicious pulmonary nodules or masses identified.  Review of the MIP images confirms the above findings.  CT ABDOMEN PELVIS FINDINGS  No evidence of abdominal aorta or iliac artery dissection or aneurysm. Hepatic steatosis again noted, however no liver masses are identified. The gallbladder, pancreas, spleen, adrenal glands, and kidneys are normal in appearance. No evidence of hydronephrosis.  Uterus and adnexal regions are unremarkable. No soft tissue masses or lymphadenopathy identified. No evidence of inflammatory  process or abnormal fluid collections. No evidence of dilated bowel loops or hernia. No suspicious bone lesions identified.  Review of the MIP images confirms the above findings.  IMPRESSION: CTA CHEST IMPRESSION  No evidence of pulmonary embolism or other acute findings.  Moderate emphysema and bibasilar scarring.  CT ABDOMEN PELVIS IMPRESSION  No acute findings.  Hepatic steatosis.   Electronically Signed  By: Myles Rosenthal   On: 07/07/2013 20:04    Assessment/Plan 57 yo female with hypoxia of unclear etiology, uti, and hypokalemia  Principal Problem:   Hypoxia Active Problems:   CEREBRAL PALSY   EMPHYSEMA   IRRITABLE BOWEL SYNDROME, HX OF   UTI (lower urinary tract infection)   Hypokalemia   Abdominal pain, acute, right lower quadrant  Replete k. Ck mag level.  Place on tele due to electrolyte abnormalities.  Place on rocephin.  cta and cxr neg for pulm source of hypoxia, and does not always correlate with her anxiety attacks.  Unclear if really has copd (exsmoker).  May benefit from pulm consult.  obs on tele.  Full code.  DAVID,RACHAL A 07/07/2013, 10:11 PM

## 2013-07-07 NOTE — ED Provider Notes (Signed)
CSN: [id number]     Arrival date & time 07/07/13  1519 History   First MD Initiated Contact with Patient 07/07/13 1525     Chief Complaint  Patient presents with  . Abdominal Pain   (Consider location/radiation/quality/duration/timing/severity/associated sxs/prior Treatment) HPI  [name] is a 57 y.o. female with PMH significant for IBS, colitis, cerebral palsy (ambulates via scooter), h/o COPD (no active treatment) c/o acute onset of RLQ pain (s/p appendectomy) when she woke up this AM. Pt states that this is consistent with prior colitis flares, unsure when she had her last one. Pt denies Fever, N/V, SOB, N/V, calf swelling, h/o DVT, PE, OSA . Pt states at the onset of the pain it was epigastric and radiated up into the bilateral chest, she attributes this to anxiety    Past Medical History  Diagnosis Date  . Anxiety   . High cholesterol   . Cerebral palsy   . Borderline diabetes   . Melanoma in situ   . Dysplasia of cervix   . Neck mass   . IBS (irritable bowel syndrome)   . Acromegaly   . Headache(784.0)   . Bladder infection   . Colitis   . History of chicken pox   . History of mumps   . History of measles   . Yeast infection   . Bacterial infection   . Trichomonas   . Ovarian cyst   . Depression   . Arthritis   . GERD (gastroesophageal reflux disease)   . Kidney infection   . Benign paroxysmal positional vertigo 02/24/2013   Past Surgical History  Procedure Laterality Date  . Leg tendon surgery    . Knee rotation    . Rotator cuff surgery    . Appendectomy    . Carpal tunnel release    . Ovarian cyst removal    . Replacement total knee bilateral  2006   Family History  Problem Relation Age of Onset  . Suicidality Father   . Depression Father   . Alcohol abuse Father   . Drug abuse Brother   . Alcohol abuse Brother   . Esophageal cancer Brother   . Depression Maternal Aunt   . Alcohol abuse Brother   . Anxiety disorder Daughter   . Depression  Daughter   . Suicidality Daughter   . Alcohol abuse Daughter   . Drug abuse Son   . Alcohol abuse Son    History  Substance Use Topics  . Smoking status: Former Smoker -- 31 years    Types: Cigarettes    Quit date: 11/18/1999  . Smokeless tobacco: Never Used  . Alcohol Use: No   OB History   Grav Para Term Preterm Abortions TAB SAB Ect Mult Living   3 2             Review of Systems 10 systems reviewed and found to be negative, except as noted in the HPI  Allergies  Miconazole nitrate and Sulfonamide derivatives  Home Medications   Current Outpatient Rx  Name  Route  Sig  Dispense  Refill  . amitriptyline (ELAVIL) 75 MG tablet   Oral   Take 1 tablet (75 mg total) by mouth at bedtime.   90 tablet   0   . atomoxetine (STRATTERA) 40 MG capsule   Oral   Take 1 capsule (40 mg total) by mouth daily.   90 capsule   0   . dicyclomine (BENTYL) 20 MG tablet   Oral  Take 20 mg by mouth every 6 (six) hours.         . diphenhydrAMINE (BENADRYL) 25 mg capsule   Oral   Take 25 mg by mouth at bedtime.         . fluticasone (VERAMYST) 27.5 MCG/SPRAY nasal spray   Nasal   Place 2 sprays into the nose daily.         . furosemide (LASIX) 40 MG tablet   Oral   Take 40 mg by mouth 2 (two) times daily.         [name] lamoTRIgine (LAMICTAL) 150 MG tablet   Oral   Take 1 tablet (150 mg total) by mouth daily.   90 tablet   0   . Melatonin 3 MG TABS   Oral   Take 3 mg by mouth.         . Multiple Vitamins-Minerals (MULTIVITAMIN WITH MINERALS) tablet   Oral   Take 1 tablet by mouth daily.         [name] omeprazole (PRILOSEC) 40 MG capsule   Oral   Take 40 mg by mouth daily.         . pravastatin (PRAVACHOL) 40 MG tablet   Oral   Take 40 mg by mouth daily.         . Probiotic Product (PROBIOTIC & ACIDOPHILUS EX ST) CAPS   Oral   Take 1 capsule by mouth daily.          BP 119/69  Pulse 95  Temp(Src) 98 F (36.7 C) (Oral)  Resp 20  SpO2 96% Physical  Exam  Nursing note and vitals reviewed. Constitutional: She is oriented to person, place, and time. She appears well-developed and well-nourished. No distress.  HENT:  Head: Normocephalic.  Mouth/Throat: Oropharynx is clear and moist.  Eyes: Conjunctivae and EOM are normal. Pupils are equal, round, and reactive to light.  Cardiovascular: Normal rate.   Pulmonary/Chest: Effort normal and breath sounds normal. No stridor. No respiratory distress. She has no wheezes. She has no rales. She exhibits no tenderness.  SaO2 85% on RA with good waveform, denies SOB  Abdominal: Soft. Bowel sounds are normal. She exhibits no distension and no mass. There is tenderness. There is no rebound and no guarding.  Protuberant, mild tension, no fluid wave, Diffusely TTP worse in the RLQ, no guarding or rebound   Musculoskeletal: Normal range of motion.  Neurological: She is alert and oriented to person, place, and time.  Psychiatric: She has a normal mood and affect.    ED Course  Procedures (including critical care time) Labs Review Labs Reviewed  CBC WITH DIFFERENTIAL - Abnormal; Notable for the following:    WBC 13.3 (*)    RBC 5.59 (*)    Hemoglobin 15.9 (*)    HCT 47.0 (*)    Neutro Abs 9.1 (*)    All other components within normal limits  URINALYSIS, ROUTINE W REFLEX MICROSCOPIC - Abnormal; Notable for the following:    APPearance CLOUDY (*)    Nitrite POSITIVE (*)    Leukocytes, UA MODERATE (*)    All other components within normal limits  BASIC METABOLIC PANEL - Abnormal; Notable for the following:    Potassium 2.5 (*)    Chloride 92 (*)    Glucose, Bld 182 (*)    All other components within normal limits  HEPATIC FUNCTION PANEL - Abnormal; Notable for the following:    AST 49 (*)    ALT 69 (*)  All other components within normal limits  BLOOD GAS, ARTERIAL - Abnormal; Notable for the following:    pCO2 arterial 47.7 (*)    Bicarbonate 28.8 (*)    Acid-Base Excess 3.6 (*)    All  other components within normal limits  D-DIMER, QUANTITATIVE - Abnormal; Notable for the following:    D-Dimer, Quant >20.00 (*)    All other components within normal limits  URINE MICROSCOPIC-ADD ON - Abnormal; Notable for the following:    Bacteria, UA MANY (*)    All other components within normal limits  URINE CULTURE  LIPASE, BLOOD  POCT I-STAT TROPONIN I   Imaging Review Dg Chest 2 View  07/07/2013   *RADIOLOGY REPORT*  Clinical Data: Shortness of breath  CHEST - 2 VIEW  Comparison: 01/23/2008  Findings: The cardiac shadow is within normal limits.  The lungs are clear bilaterally.  No acute bony abnormality is seen.  IMPRESSION: No acute abnormality noted.   Original Report Authenticated By: Alcide Clever, M.D.    Date: 07/07/2013  Rate: 87  Rhythm: normal sinus rhythm  QRS Axis: normal  Intervals: PR prolonged  ST/T Wave abnormalities: normal  Conduction Disutrbances:first-degree A-V block  PR  Narrative Interpretation:   Old EKG Reviewed: borderline prolonged PR new   MDM   1. Hypoxia   2. Hypokalemia   3. UTI (lower urinary tract infection)    Filed Vitals:   07/07/13 1530 07/07/13 2100 07/07/13 2148  BP: 119/69  120/59  Pulse: 95  97  Temp: 98 F (36.7 C)  97.5 F (36.4 C)  TempSrc: Oral  Oral  Resp: 20  14  SpO2: 96% 93% 94%     [name] is a 57 y.o. female with right lower quadrant pain (status post appendectomy) patient describes the pain is consistent with prior episodes of colitis, she also has IBS. Patient is afebrile, no nausea and vomiting, patient reports a mild constipation. Abdominal exam is nonsurgical. On physical exam patient is noted to be hypoxic at 85% on room air. She denies any shortness of breath. Patient has no history of DVT or PE. She is non-ambulatory at her baseline with history of cerebral palsy. Physical exam is not consistent with DVT, however considering her risk factors and in explained hypoxia d-dimer is ordered. EKG is  nonischemic, troponin is negative, chest x-ray shows no abnormalities. Patient is found to be hypokalemic to 2.5. She is repleted with 40 mEq by mouth. Urinalysis is also consistent with infection. She will be started on a gram of Rocephin via IV. Urine culture pending.  Patient has a positive d-dimer, she will be put in for her CT abdomen pelvis and CT angiography chest rule out PE. ABG obtained while she was on 2 L of nasal cannula shows no significant abnormalities.  CT chest and CT abdomen are unremarkable. Patient was given a trial on room air and she desatted to 84% again. Patient will need to come in for observation for hypoxia in addition to her hypokalemia and urinary tract infection.  Patient will be admitted to a telemetry bed under the care of Dr. Onalee Hua.   Medications  morphine 4 MG/ML injection 6 mg (6 mg Intravenous Given 07/07/13 1605)  ondansetron (ZOFRAN) injection 4 mg (4 mg Intravenous Given 07/07/13 1605)  iohexol (OMNIPAQUE) 300 MG/ML solution 50 mL (50 mLs Oral Contrast Given 07/07/13 1625)  potassium chloride SA (K-DUR,KLOR-CON) CR tablet 40 mEq (40 mEq Oral Given 07/07/13 1706)  sodium chloride 0.9 %  bolus 500 mL (0 mLs Intravenous Stopped 07/07/13 1944)  0.9 %  sodium chloride infusion ( Intravenous Stopped 07/07/13 1944)  cefTRIAXone (ROCEPHIN) injection 1 g (1 g Intramuscular Given 07/07/13 1903)  iohexol (OMNIPAQUE) 350 MG/ML injection 100 mL (100 mLs Intravenous Contrast Given 07/07/13 1925)  potassium chloride SA (K-DUR,KLOR-CON) CR tablet 40 mEq (40 mEq Oral Given 07/07/13 1954)  methylPREDNISolone sodium succinate (SOLU-MEDROL) 125 mg/2 mL injection 125 mg (125 mg Intravenous Given 07/07/13 2145)  albuterol (PROVENTIL) (5 MG/ML) 0.5% nebulizer solution 5 mg (5 mg Nebulization Given 07/07/13 2100)    Note: Portions of this report may have been transcribed using voice recognition software. Every effort was made to ensure accuracy; however, inadvertent computerized transcription errors  may be present      Wynetta Emery, PA-C 07/07/13 2207

## 2013-07-07 NOTE — ED Notes (Signed)
Per EMS pt coming from home with c/o RLQ abdominal pain. Pt with hx of colitis. VSS. Pt also reports nausea.

## 2013-07-07 NOTE — ED Notes (Signed)
Reason for delay in pt transfer to floor was bed request had to be  re entered for pt.

## 2013-07-08 ENCOUNTER — Encounter (HOSPITAL_COMMUNITY): Payer: Self-pay | Admitting: *Deleted

## 2013-07-08 DIAGNOSIS — N390 Urinary tract infection, site not specified: Secondary | ICD-10-CM

## 2013-07-08 DIAGNOSIS — F329 Major depressive disorder, single episode, unspecified: Secondary | ICD-10-CM

## 2013-07-08 DIAGNOSIS — I10 Essential (primary) hypertension: Secondary | ICD-10-CM

## 2013-07-08 DIAGNOSIS — K219 Gastro-esophageal reflux disease without esophagitis: Secondary | ICD-10-CM

## 2013-07-08 DIAGNOSIS — R0902 Hypoxemia: Secondary | ICD-10-CM

## 2013-07-08 LAB — BASIC METABOLIC PANEL
CO2: 24 mEq/L (ref 19–32)
Calcium: 9.4 mg/dL (ref 8.4–10.5)
Creatinine, Ser: 0.54 mg/dL (ref 0.50–1.10)
GFR calc non Af Amer: >90 mL/min (ref >90)
Glucose, Bld: 260 mg/dL — ABNORMAL HIGH (ref 70–99)
Sodium: 133 mEq/L — ABNORMAL LOW (ref 135–145)

## 2013-07-08 LAB — CBC
MCH: 28.1 pg (ref 26.0–34.0)
MCHC: 33.4 g/dL (ref 30.0–36.0)
MCV: 84.2 fL (ref 78.0–100.0)
Platelets: 268 10*3/uL (ref 150–400)
RBC: 5.3 MIL/uL — ABNORMAL HIGH (ref 3.87–5.11)
RDW: 13.9 % (ref 11.5–15.5)

## 2013-07-08 LAB — MAGNESIUM: Magnesium: 1.9 mg/dL (ref 1.5–2.5)

## 2013-07-08 MED ORDER — ENOXAPARIN SODIUM 40 MG/0.4ML ~~LOC~~ SOLN
40.0000 mg | SUBCUTANEOUS | Status: DC
  Administered 2013-07-08: 40 mg via SUBCUTANEOUS
  Filled 2013-07-08: qty 0.4

## 2013-07-08 MED ORDER — DEXTROSE 5 % IV SOLN
1.0000 g | INTRAVENOUS | Status: DC
  Filled 2013-07-08: qty 10

## 2013-07-08 MED ORDER — ASPIRIN EC 81 MG PO TBEC
81.0000 mg | DELAYED_RELEASE_TABLET | Freq: Every day | ORAL | Status: DC
  Administered 2013-07-08: 81 mg via ORAL
  Filled 2013-07-08: qty 1

## 2013-07-08 MED ORDER — SODIUM CHLORIDE 0.9 % IJ SOLN
3.0000 mL | INTRAMUSCULAR | Status: DC | PRN
  Administered 2013-07-08: 3 mL via INTRAVENOUS

## 2013-07-08 MED ORDER — SODIUM CHLORIDE 0.9 % IV SOLN: 250.0000 mL | INTRAVENOUS | Status: DC | PRN

## 2013-07-08 MED ORDER — DICYCLOMINE HCL 20 MG PO TABS
20.0000 mg | ORAL_TABLET | Freq: Two times a day (BID) | ORAL | Status: DC | PRN
  Filled 2013-07-08: qty 1

## 2013-07-08 MED ORDER — SODIUM CHLORIDE 0.9 % IJ SOLN
3.0000 mL | Freq: Two times a day (BID) | INTRAMUSCULAR | Status: DC
  Administered 2013-07-08: 3 mL via INTRAVENOUS

## 2013-07-08 MED ORDER — ALBUTEROL SULFATE (5 MG/ML) 0.5% IN NEBU: 2.5000 mg | INHALATION_SOLUTION | RESPIRATORY_TRACT | Status: DC | PRN

## 2013-07-08 MED ORDER — AMITRIPTYLINE HCL 75 MG PO TABS
75.0000 mg | ORAL_TABLET | Freq: Every day | ORAL | Status: DC
  Administered 2013-07-08: 75 mg via ORAL
  Filled 2013-07-08 (×2): qty 1

## 2013-07-08 MED ORDER — ATOMOXETINE HCL 40 MG PO CAPS
40.0000 mg | ORAL_CAPSULE | Freq: Every day | ORAL | Status: DC
  Administered 2013-07-08: 11:00:00 40 mg via ORAL
  Filled 2013-07-08: qty 1

## 2013-07-08 MED ORDER — LAMOTRIGINE 150 MG PO TABS
150.0000 mg | ORAL_TABLET | Freq: Every day | ORAL | Status: DC
  Administered 2013-07-08: 150 mg via ORAL
  Filled 2013-07-08: qty 1

## 2013-07-08 MED ORDER — AMITRIPTYLINE HCL 50 MG PO TABS: 50.0000 mg | ORAL_TABLET | Freq: Every day | ORAL | Status: DC

## 2013-07-08 MED ORDER — POTASSIUM CHLORIDE CRYS ER 20 MEQ PO TBCR
40.0000 meq | EXTENDED_RELEASE_TABLET | Freq: Two times a day (BID) | ORAL | Status: DC
  Administered 2013-07-08 (×2): 40 meq via ORAL
  Filled 2013-07-08 (×3): qty 2

## 2013-07-08 MED ORDER — FUROSEMIDE 40 MG PO TABS
40.0000 mg | ORAL_TABLET | Freq: Every day | ORAL | Status: DC
  Administered 2013-07-08: 11:00:00 40 mg via ORAL
  Filled 2013-07-08: qty 1

## 2013-07-08 MED ORDER — BUSPIRONE HCL 7.5 MG PO TABS: 7.5000 mg | ORAL_TABLET | Freq: Three times a day (TID) | ORAL | Status: DC

## 2013-07-08 MED ORDER — SODIUM CHLORIDE 0.9 % IJ SOLN: 3.0000 mL | Freq: Two times a day (BID) | INTRAMUSCULAR | Status: DC

## 2013-07-08 MED ORDER — ALBUTEROL SULFATE HFA 108 (90 BASE) MCG/ACT IN AERS: 2.0000 | INHALATION_SPRAY | Freq: Four times a day (QID) | RESPIRATORY_TRACT | Status: DC | PRN

## 2013-07-08 MED ORDER — LEVOFLOXACIN 500 MG PO TABS: 500.0000 mg | ORAL_TABLET | Freq: Every day | ORAL | Status: AC

## 2013-07-08 NOTE — Progress Notes (Signed)
Patient oxygen at rest was 88% on room air. Placed 2L oxygen on patient, 93%. Darien Ramus, RN

## 2013-07-08 NOTE — ED Provider Notes (Signed)
Medical screening examination/treatment/procedure(s) were conducted as a shared visit with non-physician practitioner(s) or resident and myself. I personally evaluated the patient during the encounter and agree with the findings and plan unless otherwise indicated.  Lower abd pain, worse on the right, similar to previous, mild nausea, no vomiting. No blood in stools or diarrhea. No fevers. Similar to previous. No recent CT scans. Worse with eating. Exam mild lower tender RL and LL quadrant, no guarding, obese, mild dry mm, RRR. Plan for labs, fluids pain meds and +/_ CT scan depending pt recheck/ labs. Discussed close fup outpatient. Pt has a GI dr.  Pt continued to have episodes of hypoxia 85-90% despite blood cuff/ arm change.  ABG no acute finding.  D dimer very high however no blood clot on CT.  Abd CT no acute finding.  Pt well appearing.  Plan for observation of pulse ox/ vitals.  Possibly secondary COPD exacerbation, nebs/ steroids.  Abdominal pain, Hypoxia, HypoK     Enid Skeens, MD 07/08/13 0021

## 2013-07-08 NOTE — Discharge Summary (Signed)
Physician Discharge Summary  [name] ION:[id number] DOB: 03-Aug-1956 DOA: 07/07/2013  PCP: Ailene Ravel, MD  Admit date: 07/07/2013 Discharge date: 07/08/2013  Time spent: >30  minutes  Recommendations for Outpatient Follow-up:  BMET to follow electrolytes and kidney function Reassess BP and if needed start antihypertensive drugs  Discharge Diagnoses:  Hypoxia CEREBRAL PALSY EMPHYSEMA IRRITABLE BOWEL SYNDROME, HX OF UTI (lower urinary tract infection) Hypokalemia GERD   Discharge Condition: stable and improved. Will discharge home with oxygen supplementation and follow up with pulmonology service for PFT's and sleep study. Will also follow with PCP in 2 weeks.  Diet recommendation: heart healthy diet  Filed Weights   07/07/13 2330  Weight: 103.8 kg (228 lb 13.4 oz)    History of present illness:  57 yo female h/o cerebral palsy wheelchair bound, anxiety, ??copd (shes not sure if this is accurate), ibs comes in after calling EMS for hyperventilating and feeling anxious. She was also having some rlq abd pain at the time. Her brother just died last month, since then her anxiety attacks have become more frequent, but she feels is normal for her grieving. She denies any fevers. No cough. No le edema or swelling. In ED she has had several episodes where her oxygen sats drop to mid 80's on RA, pt is asymptomatic of this. During my interview, after taking off her nasal canula, her oxygen sats stayed above 92% on RA while speaking in full sentences conversing with me for over 10 minutes. She denies chest pain. No sob at this time.   Hospital Course:  1-Hypoxia: patient with hx of heavy tobacco abuse in the past (quit about 10 years ago); also overweight and snoring at night. -patient might have COPD vs OHS vs sleep apnea -discussed with pulmonology service and has recommended PRN albuterol and outpatient visit for PFT's and sleep study -patient O2 sat on RA 88%; will arrange for  Oxygen supplementation as an outpatient through [facility] and she will follow with pulmonology to decide further needs. -will continue PPI -and patient will be on levaquin for UTI, which would cover any bronchitis too.  2-UTI: will be treat with levaquin for 5 days as non-complicated UTI.  3-anxiety: will start treatment with buspar; patient will follow with psychiatrist as an outpatient.  4-hypertension: currently not taking any meds. Will recommend low sodium diet and patient will follow with PCP as an outpatient for further evaluation and institute treatment if BP remains elevated  5-GERD: continue PPI  6-HLD: continue statins.  7-Hypokalemia: repleted and WNL at discharge.  Procedures:  See below for x-ray reports   Consultations:  Curbside pulmonology (will see patient in outpatient setting for PFT's and sleep study)  Discharge Exam: Filed Vitals:   07/08/13 0445  BP: 147/70  Pulse: 119  Temp: 97.9 F (36.6 C)  Resp: 20    General: NAD, feeling better, still with mild hypoxia on RA Cardiovascular: S1 and S2, no rubs or gallops Respiratory: no wheezing, no rhonchi or crackles Abdomen: soft, NT, ND, positive BS Neuro: no new focal deficit.  Discharge Instructions  Discharge Orders   Future Appointments Provider Department Dept Phone   07/09/2013 10:45 AM Nyoka Cowden, MD [facility] [phone]   Future Orders Complete By Expires   Diet - low sodium heart healthy  As directed    Discharge instructions  As directed    Comments:     Take medications as prescribed Follow with Pulmonology service as instructed Use oxygen supplementation  continuously daily Low calorie diet and weight loss are recommended  Arrange follow up with PCP and psychiatrist at discharge (in 2 weeks)       Medication List         albuterol 108 (90 BASE) MCG/ACT inhaler  Commonly known as:  PROVENTIL HFA;VENTOLIN HFA  Inhale 2 puffs into the lungs every 6 (six)  hours as needed for wheezing.     amitriptyline 50 MG tablet  Commonly known as:  ELAVIL  Take 1 tablet (50 mg total) by mouth at bedtime.     atomoxetine 40 MG capsule  Commonly known as:  STRATTERA  Take 1 capsule (40 mg total) by mouth daily.     busPIRone 7.5 MG tablet  Commonly known as:  BUSPAR  Take 1 tablet (7.5 mg total) by mouth 3 (three) times daily.     calcium-vitamin D 500-200 MG-UNIT per tablet  Commonly known as:  OSCAL WITH D  Take 1 tablet by mouth daily with breakfast.     dicyclomine 20 MG tablet  Commonly known as:  BENTYL  Take 20 mg by mouth 2 (two) times daily as needed (for IBS).     diphenhydrAMINE 25 mg capsule  Commonly known as:  BENADRYL  Take 25 mg by mouth at bedtime.     fluticasone 27.5 MCG/SPRAY nasal spray  Commonly known as:  VERAMYST  Place 2 sprays into the nose daily.     furosemide 40 MG tablet  Commonly known as:  LASIX  Take 40 mg by mouth daily.     lamoTRIgine 150 MG tablet  Commonly known as:  LAMICTAL  Take 1 tablet (150 mg total) by mouth daily.     levofloxacin 500 MG tablet  Commonly known as:  LEVAQUIN  Take 1 tablet (500 mg total) by mouth daily.     Magnesium Oxide 250 MG Tabs  Take 1 tablet by mouth daily.     Melatonin 3 MG Tabs  Take 3 mg by mouth at bedtime.     multivitamin with minerals tablet  Take 1 tablet by mouth daily.     omeprazole 40 MG capsule  Commonly known as:  PRILOSEC  Take 40 mg by mouth 2 (two) times daily.     OVER THE COUNTER MEDICATION  Take 1 capsule by mouth daily. Garcinia Cambogia 500mg  tablet     pravastatin 40 MG tablet  Commonly known as:  PRAVACHOL  Take 40 mg by mouth daily.     PROBIOTIC & ACIDOPHILUS EX ST Caps  Take 1 capsule by mouth daily.       Allergies  Allergen Reactions  . Miconazole Nitrate Itching  . Sulfonamide Derivatives Rash       Follow-up Information   Follow up with [facility], MD. Schedule an appointment as soon as possible for a  visit in 2 weeks.   Specialty:  Family Medicine   Contact information:   Dr. Burnell Blanks [address] [place] [address] [phone]       Follow up with Sandrea Hughs, MD On 07/09/2013. (At 10:30 am)    Specialty:  Pulmonary Disease   Contact information:   [address] [place] [address] [phone]       The results of significant diagnostics from this hospitalization (including imaging, microbiology, ancillary and laboratory) are listed below for reference.    Significant Diagnostic Studies: Dg Chest 2 View  07/07/2013   *RADIOLOGY REPORT*  Clinical Data: Shortness of breath  CHEST - 2 VIEW  Comparison: 01/23/2008  Findings: The cardiac shadow is within normal limits.  The lungs are clear bilaterally.  No acute bony abnormality is seen.  IMPRESSION: No acute abnormality noted.   Original Report Authenticated By: Alcide Clever, M.D.   Ct Angio Chest Pe W/cm &/or Wo Cm  07/07/2013   CLINICAL DATA:  Chest and abdominal pain. Shortness of breath and Hypoxia.  EXAM: CT ANGIOGRAPHY CHEST  CT ABDOMEN AND PELVIS WITH CONTRAST  TECHNIQUE: Multidetector CT imaging of the chest was performed using the standard protocol during bolus administration of intravenous contrast. Multiplanar CT image reconstructions including MIPs were obtained to evaluate the vascular anatomy. Multidetector CT imaging of the abdomen and pelvis was performed using the standard protocol during bolus administration of intravenous contrast.  CONTRAST:  OMNIPAQUE IOHEXOL 350 MG/ML SOLN  COMPARISON:  Chest CT on 05/12/2006  FINDINGS: CTA CHEST FINDINGS  Satisfactory opacification of pulmonary arteries noted, and there is no evidence of pulmonary emboli. No evidence of thoracic aortic dissection or aneurysm. No evidence of mediastinal hematoma or mass. The no adenopathy identified within the thorax.  No evidence of pleural or pericardial effusion. Mild bibasilar scarring noted. Moderate emphysema noted.  No evidence of pulmonary infiltrate or central endobronchial obstruction. No suspicious pulmonary nodules or masses identified.  Review of the MIP images confirms the above findings.  CT ABDOMEN PELVIS FINDINGS  No evidence of abdominal aorta or iliac artery dissection or aneurysm. Hepatic steatosis again noted, however no liver masses are identified. The gallbladder, pancreas, spleen, adrenal glands, and kidneys are normal in appearance. No evidence of hydronephrosis.  Uterus and adnexal regions are unremarkable. No soft tissue masses or lymphadenopathy identified. No evidence of inflammatory process or abnormal fluid collections. No evidence of dilated bowel loops or hernia. No suspicious bone lesions identified.  Review of the MIP images confirms the above findings.  IMPRESSION: CTA CHEST IMPRESSION  No evidence of pulmonary embolism or other acute findings.  Moderate emphysema and bibasilar scarring.  CT ABDOMEN PELVIS IMPRESSION  No acute findings.  Hepatic steatosis.   Electronically Signed   By: Myles Rosenthal   On: 07/07/2013 20:04   Ct Abdomen Pelvis W Contrast  07/07/2013   CLINICAL DATA:  Chest and abdominal pain. Shortness of breath and Hypoxia.  EXAM: CT ANGIOGRAPHY CHEST  CT ABDOMEN AND PELVIS WITH CONTRAST  TECHNIQUE: Multidetector CT imaging of the chest was performed using the standard protocol during bolus administration of intravenous contrast. Multiplanar CT image reconstructions including MIPs were obtained to evaluate the vascular anatomy. Multidetector CT imaging of the abdomen and pelvis was performed using the standard protocol during bolus administration of intravenous contrast.  CONTRAST:  OMNIPAQUE IOHEXOL 350 MG/ML SOLN  COMPARISON:  Chest CT on 05/12/2006  FINDINGS: CTA CHEST FINDINGS  Satisfactory opacification of pulmonary arteries noted, and there is no evidence of pulmonary emboli. No evidence of thoracic aortic dissection or aneurysm. No evidence of mediastinal hematoma or  mass. The no adenopathy identified within the thorax.  No evidence of pleural or pericardial effusion. Mild bibasilar scarring noted. Moderate emphysema noted. No evidence of pulmonary infiltrate or central endobronchial obstruction. No suspicious pulmonary nodules or masses identified.  Review of the MIP images confirms the above findings.  CT ABDOMEN PELVIS FINDINGS  No evidence of abdominal aorta or iliac artery dissection or aneurysm. Hepatic steatosis again noted, however no liver masses are identified. The gallbladder, pancreas, spleen, adrenal glands, and kidneys are normal in appearance. No  evidence of hydronephrosis.  Uterus and adnexal regions are unremarkable. No soft tissue masses or lymphadenopathy identified. No evidence of inflammatory process or abnormal fluid collections. No evidence of dilated bowel loops or hernia. No suspicious bone lesions identified.  Review of the MIP images confirms the above findings.  IMPRESSION: CTA CHEST IMPRESSION  No evidence of pulmonary embolism or other acute findings.  Moderate emphysema and bibasilar scarring.  CT ABDOMEN PELVIS IMPRESSION  No acute findings.  Hepatic steatosis.   Electronically Signed   By: Myles Rosenthal   On: 07/07/2013 20:04   Labs: Basic Metabolic Panel:  Recent Labs Lab 07/07/13 1536 07/08/13 0410  NA 135 133*  K 2.5* 3.3*  CL 92* 93*  CO2 31 24  GLUCOSE 182* 260*  BUN 12 11  CREATININE 0.60 0.54  CALCIUM 9.7 9.4  MG  --  1.9   Liver Function Tests:  Recent Labs Lab 07/07/13 1536  AST 49*  ALT 69*  ALKPHOS 107  BILITOT 0.5  PROT 7.3  ALBUMIN 3.9    Recent Labs Lab 07/07/13 1536  LIPASE 21   CBC:  Recent Labs Lab 07/07/13 1536 07/08/13 0410  WBC 13.3* 20.1*  NEUTROABS 9.1*  --   HGB 15.9* 14.9  HCT 47.0* 44.6  MCV 84.1 84.2  PLT 257 268    Signed:  Tobe Kervin  [facility] 07/08/2013, 12:50 PM

## 2013-07-08 NOTE — Progress Notes (Signed)
[facility]  [facility] is providing the following services: Oxygen  If patient discharges after hours, please call [phone].   Renard Hamper 07/08/2013, 10:34 AM

## 2013-07-09 ENCOUNTER — Institutional Professional Consult (permissible substitution): Payer: Self-pay | Admitting: Internal Medicine

## 2013-07-09 LAB — URINE CULTURE

## 2013-07-17 ENCOUNTER — Ambulatory Visit (INDEPENDENT_AMBULATORY_CARE_PROVIDER_SITE_OTHER): Payer: PRIVATE HEALTH INSURANCE | Admitting: Internal Medicine

## 2013-07-17 ENCOUNTER — Encounter: Payer: Self-pay | Admitting: Internal Medicine

## 2013-07-17 VITALS — BP 120/86 | HR 101 | Temp 97.0°F

## 2013-07-17 DIAGNOSIS — G4734 Idiopathic sleep related nonobstructive alveolar hypoventilation: Secondary | ICD-10-CM

## 2013-07-17 DIAGNOSIS — Z23 Encounter for immunization: Secondary | ICD-10-CM

## 2013-07-17 DIAGNOSIS — R0902 Hypoxemia: Secondary | ICD-10-CM

## 2013-07-17 DIAGNOSIS — J9600 Acute respiratory failure, unspecified whether with hypoxia or hypercapnia: Secondary | ICD-10-CM

## 2013-07-17 DIAGNOSIS — R0609 Other forms of dyspnea: Secondary | ICD-10-CM

## 2013-07-17 DIAGNOSIS — R0600 Dyspnea, unspecified: Secondary | ICD-10-CM

## 2013-07-17 DIAGNOSIS — J438 Other emphysema: Secondary | ICD-10-CM

## 2013-07-17 NOTE — Progress Notes (Signed)
Subjective:    Patient ID: [name], female    DOB: 04-20-1956  MRN: [id number]  HPI  23 yowf cp pt quit smoking 2001 with tendency to seasonal rhinitis fall > spring limited somewhat since knee problems related to cp x 2006 referred to pulmonary clinic 07/17/13 p admit:  Admit date: 07/07/2013  Discharge date: 07/08/2013   Recommendations for Outpatient Follow-up:  BMET to follow electrolytes and kidney function  Reassess BP and if needed start antihypertensive drugs  Discharge Diagnoses:  Hypoxia  CEREBRAL PALSY  EMPHYSEMA  IRRITABLE BOWEL SYNDROME, HX OF  UTI (lower urinary tract infection)  Hypokalemia  GERD  Discharge Condition: stable and improved. Will discharge home with oxygen supplementation and follow up with pulmonology service for PFT's and sleep study. Will also follow with PCP in 2 weeks.  Diet recommendation: heart healthy diet  Filed Weights    07/07/13 2330   Weight:  103.8 kg (228 lb 13.4 oz)   History of present illness:  57 yo female h/o cerebral palsy wheelchair bound, anxiety, ??copd (shes not sure if this is accurate), ibs comes in after calling EMS for hyperventilating and feeling anxious. She was also having some rlq abd pain at the time. Her brother just died last month, since then her anxiety attacks have become more frequent, but she feels is normal for her grieving. She denies any fevers. No cough. No le edema or swelling. In ED she has had several episodes where her oxygen sats drop to mid 80's on RA, pt is asymptomatic of this. During my interview, after taking off her nasal canula, her oxygen sats stayed above 92% on RA while speaking in full sentences conversing with me for over 10 minutes. She denies chest pain. No sob at this time.  Hospital Course:  1-Hypoxia: patient with hx of heavy tobacco abuse in the past (quit about 10 years ago); also overweight and snoring at night.  -patient might have COPD vs OHS vs sleep apnea  -discussed with  pulmonology service and has recommended PRN albuterol and outpatient visit for PFT's and sleep study  -patient O2 sat on RA 88%; will arrange for Oxygen supplementation [facility] an [facility] and she will follow with pulmonology to decide further needs.  -will continue PPI  -and patient will be on levaquin for UTI, which would cover any bronchitis too.  2-UTI: will be treat with levaquin for 5 days as non-complicated UTI.  3-anxiety: will start treatment with buspar; patient will follow with psychiatrist as an outpatient.  4-hypertension: currently not taking any meds. Will recommend low sodium diet and patient will follow with PCP as an outpatient for further evaluation and institute treatment if BP remains elevated  5-GERD: continue PPI  6-HLD: continue statins.  7-Hypokalemia: repleted and WNL at discharge.    07/17/2013 1st [facility] office visit/ Gerold Sar cc first episode ever with a breathy problem occurred abruptly at the usual hour of wakening p self transferred  to [facility] while urinating and felt very weak, not nauseated and no tingling > lasted 10-15 min, better on 02 ? Came back again same the day when off 02 stopped but then 02 subsequently d/c for daytime by   Kilroy s recurrence of symptoms while newly  on alb tid but doesn't really feel she needs it. Also on 02 hs and rec PSS not scheduled yet   No obvious day to day or daytime variabilty or assoc chronic cough or cp or chest tightness,  subjective wheeze overt sinus or hb symptoms. No unusual exp hx or h/o childhood pna/ asthma or knowledge of premature birth.  Sleeping ok without nocturnal  or early am exacerbation  of respiratory  c/o's or need for noct saba. Also denies any obvious fluctuation of symptoms with weather or environmental changes or other aggravating or alleviating factors except as outlined above   Current Medications, Allergies, Complete Past Medical History, Past Surgical History, Family  History, and Social History were reviewed in [employer] record.            Review of Systems  Constitutional: Negative for fever, chills and unexpected weight change.  HENT: Positive for congestion, sneezing and trouble swallowing. Negative for ear pain, nosebleeds, sore throat, rhinorrhea, dental problem, voice change, postnasal drip and sinus pressure.   Eyes: Negative for visual disturbance.  Respiratory: Negative for cough, choking and shortness of breath.   Cardiovascular: Negative for chest pain and leg swelling.  Gastrointestinal: Negative for vomiting, abdominal pain and diarrhea.  Genitourinary: Negative for difficulty urinating.  Musculoskeletal: Positive for arthralgias.  Skin: Negative for rash.  Neurological: Positive for headaches. Negative for tremors and syncope.  Hematological: Does not bruise/bleed easily.       Objective:   Physical Exam Motorized w/c quiet speaking wf nad Wt Readings from Last 3 Encounters:  07/07/13 228 lb 13.4 oz (103.8 kg)  03/16/13 230 lb (104.327 kg)  02/24/13 227 lb (102.967 kg)     HEENT: nl dentition, turbinates, and orophanx. Nl external ear canals without cough reflex   NECK :  without JVD/Nodes/TM/ nl carotid upstrokes bilaterally   LUNGS: no acc muscle use, clear to A and P bilaterally without cough on insp or exp maneuvers   CV:  RRR  no s3 or murmur or increase in P2, no edema   ABD:  soft and nontender with nl excursion in the supine position. No bruits or organomegaly, bowel sounds nl  MS:  warm without deformities, calf tenderness, cyanosis or clubbing  SKIN: warm and dry without lesions    NEURO:  alert, approp.sitting in motorized w/c    CTa 07/07/13 No evidence of pulmonary embolism or other acute findings.  Moderate emphysema and bibasilar scarring.     Assessment & Plan:

## 2013-07-17 NOTE — Patient Instructions (Addendum)
Only use your albuterol as a rescue medication to be used if you can't catch your breath by resting or doing a relaxed purse lip breathing pattern. The less you use it, the better it will work when you need it.   Please see patient coordinator before you leave today  to schedule overnight oxygen level on Room Air and I will call you with results and go from there

## 2013-07-18 NOTE — Assessment & Plan Note (Signed)
Agee does not need 02 at rest and no physically active enough to need it daytime but may benefit from noct 02 due to obesity/ immobility creating noct atx in dep segments > rec ono RA and go from there

## 2013-07-18 NOTE — Assessment & Plan Note (Addendum)
See CT chest 07/07/13 - spirometry 07/17/13 > non physiologic f/v loop but no evidence of any airflow obstruction at all   She does not have sign clinical copd despite ct findings and ok to just use the alb prn for now

## 2013-07-19 DIAGNOSIS — J9600 Acute respiratory failure, unspecified whether with hypoxia or hypercapnia: Secondary | ICD-10-CM | POA: Insufficient documentation

## 2013-07-19 NOTE — Assessment & Plan Note (Signed)
Most likely this was related to atelectasis from obesity/ immobility plus ? Uti/ low grade ALI from infection but was mild and has resolved (or there was spurious recording which is less likely because the p02 was only 81 on 2lpm)

## 2013-07-25 ENCOUNTER — Ambulatory Visit (INDEPENDENT_AMBULATORY_CARE_PROVIDER_SITE_OTHER): Payer: PRIVATE HEALTH INSURANCE | Admitting: Emergency Medicine

## 2013-07-25 VITALS — BP 109/73 | HR 76 | Temp 98.2°F | Resp 16

## 2013-07-25 DIAGNOSIS — H60399 Other infective otitis externa, unspecified ear: Secondary | ICD-10-CM

## 2013-07-25 DIAGNOSIS — H60391 Other infective otitis externa, right ear: Secondary | ICD-10-CM

## 2013-07-25 MED ORDER — OFLOXACIN 0.3 % OT SOLN: 5.0000 [drp] | Freq: Two times a day (BID) | OTIC | Status: DC

## 2013-07-25 NOTE — Progress Notes (Signed)
  Subjective:    Patient ID: [name], female    DOB: 10-29-1956, 57 y.o.   MRN: [id number]  HPI  57 y.o. Female presents to clinic with otalgia of the right ear. Has noticed some swelling. Denies any trouble hearing and has pain when tugging on ear. Some soreness into the jaw. Has put q-tip in ear. Has allergies to monostat and sulfa.  Was in hospital a week and half ago. Woke up on Tuesday and was unable to use the bathroom, had trouble with breathing and was seen at hospital. Has issues with sodium being high. Potassium level low. Currently on prescribed med for this.    Review of Systems     Objective:   Physical Exam        Assessment & Plan:

## 2013-07-25 NOTE — Patient Instructions (Addendum)
Be sure you followup with your family doctor in 2 weeks for repeat blood work.Otitis Externa Otitis externa is a bacterial or fungal infection of the outer ear canal. This is the area from the eardrum to the outside of the ear. Otitis externa is sometimes called "swimmer's ear." CAUSES  Possible causes of infection include:  Swimming in dirty water.  Moisture remaining in the ear after swimming or bathing.  Mild injury (trauma) to the ear.  Objects stuck in the ear (foreign body).  Cuts or scrapes (abrasions) on the outside of the ear. SYMPTOMS  The first symptom of infection is often itching in the ear canal. Later signs and symptoms may include swelling and redness of the ear canal, ear pain, and yellowish-white fluid (pus) coming from the ear. The ear pain may be worse when pulling on the earlobe. DIAGNOSIS  Your caregiver will perform a physical exam. A sample of fluid may be taken from the ear and examined for bacteria or fungi. TREATMENT  Antibiotic ear drops are often given for 10 to 14 days. Treatment may also include pain medicine or corticosteroids to reduce itching and swelling. PREVENTION   Keep your ear dry. Use the corner of a towel to absorb water out of the ear canal after swimming or bathing.  Avoid scratching or putting objects inside your ear. This can damage the ear canal or remove the protective wax that lines the canal. This makes it easier for bacteria and fungi to grow.  Avoid swimming in lakes, polluted water, or poorly chlorinated pools.  You may use ear drops made of rubbing alcohol and vinegar after swimming. Combine equal parts of white vinegar and alcohol in a bottle. Put 3 or 4 drops into each ear after swimming. HOME CARE INSTRUCTIONS   Apply antibiotic ear drops to the ear canal as prescribed by your caregiver.  Only take over-the-counter or prescription medicines for pain, discomfort, or fever as directed by your caregiver.  If you have diabetes,  follow any additional treatment instructions from your caregiver.  Keep all follow-up appointments as directed by your caregiver. SEEK MEDICAL CARE IF:   You have a fever.  Your ear is still red, swollen, painful, or draining pus after 3 days.  Your redness, swelling, or pain gets worse.  You have a severe headache.  You have redness, swelling, pain, or tenderness in the area behind your ear. MAKE SURE YOU:   Understand these instructions.  Will watch your condition.  Will get help right away if you are not doing well or get worse. Document Released: 10/15/2005 Document Revised: 01/07/2012 Document Reviewed: 11/01/2011 [facility] Patient Information 2014 [place], [place].

## 2013-07-25 NOTE — Progress Notes (Signed)
  Subjective:    Patient ID: [name], female    DOB: 07/18/1956, 57 y.o.   MRN: [id number]  HPI patient enters with a 24-hour history of severe pain in her right ear. She has a history of otitis externa in the past. She has severe pain with movement of the external ear. She has not had a cold she has not had any trouble with her hearing she has not had a sore throat. She has not had a cough    Review of Systems     Objective:   Physical Exam patient is alert and cooperative she is not in any distress. Her neck is supple. Throat is normal. Left TM and canal is normal the right canal is red swollen with a period of an exudate over the canal.        Assessment & Plan:  We'll treat with Floxin otic for an external otitis. Of note she was recently in the hospital for 24 hours with hypoxia hyponatremia and height to a linear. She is scheduled to follow this up with her primary care physician in 2 weeks.

## 2013-07-31 ENCOUNTER — Telehealth: Payer: Self-pay | Admitting: Internal Medicine

## 2013-07-31 ENCOUNTER — Encounter: Payer: Self-pay | Admitting: Internal Medicine

## 2013-07-31 NOTE — Telephone Encounter (Signed)
Per MW- ONO 2lpm looks good Pt needs to cont o2 2lpm with sleep  Spoke with pt and notified of results per Dr. Sherene Sires. Pt verbalized understanding and denied any questions.

## 2013-09-23 ENCOUNTER — Encounter (HOSPITAL_COMMUNITY): Payer: Self-pay | Admitting: Psychiatry

## 2013-09-23 ENCOUNTER — Ambulatory Visit (INDEPENDENT_AMBULATORY_CARE_PROVIDER_SITE_OTHER): Payer: 59 | Admitting: Psychiatry

## 2013-09-23 ENCOUNTER — Ambulatory Visit (HOSPITAL_COMMUNITY): Payer: Self-pay | Admitting: Psychiatry

## 2013-09-23 VITALS — BP 130/77 | HR 90

## 2013-09-23 DIAGNOSIS — F319 Bipolar disorder, unspecified: Secondary | ICD-10-CM

## 2013-09-23 DIAGNOSIS — F909 Attention-deficit hyperactivity disorder, unspecified type: Secondary | ICD-10-CM

## 2013-09-23 MED ORDER — ATOMOXETINE HCL 40 MG PO CAPS: 40.0000 mg | ORAL_CAPSULE | Freq: Every day | ORAL | Status: DC

## 2013-09-23 MED ORDER — LAMOTRIGINE 150 MG PO TABS: 150.0000 mg | ORAL_TABLET | Freq: Every day | ORAL | Status: DC

## 2013-09-23 MED ORDER — AMITRIPTYLINE HCL 50 MG PO TABS: 50.0000 mg | ORAL_TABLET | Freq: Every day | ORAL | Status: DC

## 2013-09-23 MED ORDER — BUSPIRONE HCL 10 MG PO TABS: 10.0000 mg | ORAL_TABLET | Freq: Two times a day (BID) | ORAL | Status: DC
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
## 2013-11-17 ENCOUNTER — Emergency Department (HOSPITAL_COMMUNITY)
Admission: EM | Admit: 2013-11-17 | Discharge: 2013-11-18 | Disposition: A | Discharge status: Home / Self Care | Payer: PRIVATE HEALTH INSURANCE | Attending: Emergency Medicine | Admitting: Emergency Medicine

## 2013-11-17 ENCOUNTER — Encounter (HOSPITAL_COMMUNITY): Payer: Self-pay | Admitting: Emergency Medicine

## 2013-11-17 ENCOUNTER — Emergency Department (INDEPENDENT_AMBULATORY_CARE_PROVIDER_SITE_OTHER)
Admission: EM | Admit: 2013-11-17 | Discharge: 2013-11-17 | Disposition: A | Discharge status: Nursing Home (Includes ICF) | Payer: PRIVATE HEALTH INSURANCE | Source: Home / Self Care

## 2013-11-17 DIAGNOSIS — K529 Noninfective gastroenteritis and colitis, unspecified: Secondary | ICD-10-CM

## 2013-11-17 DIAGNOSIS — Z8669 Personal history of other diseases of the nervous system and sense organs: Secondary | ICD-10-CM | POA: Insufficient documentation

## 2013-11-17 DIAGNOSIS — R1032 Left lower quadrant pain: Secondary | ICD-10-CM

## 2013-11-17 DIAGNOSIS — Z8742 Personal history of other diseases of the female genital tract: Secondary | ICD-10-CM | POA: Insufficient documentation

## 2013-11-17 DIAGNOSIS — Z79899 Other long term (current) drug therapy: Secondary | ICD-10-CM | POA: Insufficient documentation

## 2013-11-17 DIAGNOSIS — Z87440 Personal history of urinary (tract) infections: Secondary | ICD-10-CM | POA: Insufficient documentation

## 2013-11-17 DIAGNOSIS — R110 Nausea: Secondary | ICD-10-CM | POA: Insufficient documentation

## 2013-11-17 DIAGNOSIS — K219 Gastro-esophageal reflux disease without esophagitis: Secondary | ICD-10-CM | POA: Insufficient documentation

## 2013-11-17 DIAGNOSIS — Z85828 Personal history of other malignant neoplasm of skin: Secondary | ICD-10-CM | POA: Insufficient documentation

## 2013-11-17 DIAGNOSIS — IMO0002 Reserved for concepts with insufficient information to code with codable children: Secondary | ICD-10-CM | POA: Insufficient documentation

## 2013-11-17 DIAGNOSIS — F3289 Other specified depressive episodes: Secondary | ICD-10-CM | POA: Insufficient documentation

## 2013-11-17 DIAGNOSIS — F329 Major depressive disorder, single episode, unspecified: Secondary | ICD-10-CM | POA: Insufficient documentation

## 2013-11-17 DIAGNOSIS — Z87891 Personal history of nicotine dependence: Secondary | ICD-10-CM | POA: Insufficient documentation

## 2013-11-17 DIAGNOSIS — Z8739 Personal history of other diseases of the musculoskeletal system and connective tissue: Secondary | ICD-10-CM | POA: Insufficient documentation

## 2013-11-17 DIAGNOSIS — K6389 Other specified diseases of intestine: Secondary | ICD-10-CM | POA: Insufficient documentation

## 2013-11-17 DIAGNOSIS — Z8619 Personal history of other infectious and parasitic diseases: Secondary | ICD-10-CM | POA: Insufficient documentation

## 2013-11-17 DIAGNOSIS — E7800 Pure hypercholesterolemia, unspecified: Secondary | ICD-10-CM | POA: Insufficient documentation

## 2013-11-17 DIAGNOSIS — Z9889 Other specified postprocedural states: Secondary | ICD-10-CM | POA: Insufficient documentation

## 2013-11-17 DIAGNOSIS — R52 Pain, unspecified: Secondary | ICD-10-CM

## 2013-11-17 DIAGNOSIS — Z792 Long term (current) use of antibiotics: Secondary | ICD-10-CM | POA: Insufficient documentation

## 2013-11-17 DIAGNOSIS — F411 Generalized anxiety disorder: Secondary | ICD-10-CM | POA: Insufficient documentation

## 2013-11-17 NOTE — ED Provider Notes (Cosign Needed)
CSN: [id number]     Arrival date & time 11/17/13  1653 History   First MD Initiated Contact with Patient 11/17/13 1917     Chief Complaint  Patient presents with  . Flank Pain   (Consider location/radiation/quality/duration/timing/severity/associated sxs/prior Treatment) HPI Comments: 58 year old female presents with moderate to severe pain in the left lower quadrant for 3 days. It has been getting progressively worse She now has a temperature of 99.5. Denies nausea vomiting diarrhea, vaginal symptoms or urinary symptoms. She is confined to a wheelchair and is unable to get onto a table for examination. She is also unable to provide for urinalysis.    Past Medical History  Diagnosis Date  . Anxiety   . High cholesterol   . Cerebral palsy   . Borderline diabetes   . Melanoma in situ   . Dysplasia of cervix   . Neck mass   . IBS (irritable bowel syndrome)   . Acromegaly   . Headache(784.0)   . Bladder infection   . Colitis   . History of chicken pox   . History of mumps   . History of measles   . Yeast infection   . Bacterial infection   . Trichomonas   . Ovarian cyst   . Depression   . Arthritis   . GERD (gastroesophageal reflux disease)   . Kidney infection   . Benign paroxysmal positional vertigo 02/24/2013   Past Surgical History  Procedure Laterality Date  . Leg tendon surgery    . Knee rotation    . Rotator cuff surgery    . Appendectomy    . Carpal tunnel release    . Ovarian cyst removal    . Replacement total knee bilateral  2006   Family History  Problem Relation Age of Onset  . Suicidality Father   . Depression Father   . Alcohol abuse Father   . Drug abuse Brother   . Alcohol abuse Brother   . Esophageal cancer Brother   . Depression Maternal Aunt   . Alcohol abuse Brother   . Anxiety disorder Daughter   . Depression Daughter   . Suicidality Daughter   . Alcohol abuse Daughter   . Drug abuse Son   . Alcohol abuse Son   . Emphysema Sister    smoked  . Emphysema Father     smoked   History  Substance Use Topics  . Smoking status: Former Smoker -- 1.00 packs/day for 31 years    Types: Cigarettes    Quit date: 11/18/1999  . Smokeless tobacco: Never Used  . Alcohol Use: No   OB History   Grav Para Term Preterm Abortions TAB SAB Ect Mult Living   3 2             Review of Systems  Constitutional: Positive for fever and activity change.  HENT: Negative.   Respiratory: Negative.   Cardiovascular: Negative.   Gastrointestinal: Positive for abdominal pain and constipation.  Genitourinary: Negative.   Skin: Negative.     Allergies  Miconazole nitrate and Sulfonamide derivatives  Home Medications   Current Outpatient Rx  Name  Route  Sig  Dispense  Refill  . albuterol (PROAIR HFA) 108 (90 BASE) MCG/ACT inhaler   Inhalation   Inhale 2 puffs into the lungs every 6 (six) hours as needed for wheezing or shortness of breath.         [name] amitriptyline (ELAVIL) 50 MG tablet   Oral   Take 1 tablet (  50 mg total) by mouth at bedtime.   90 tablet   0   . atomoxetine (STRATTERA) 40 MG capsule   Oral   Take 1 capsule (40 mg total) by mouth daily.   90 capsule   0   . busPIRone (BUSPAR) 10 MG tablet   Oral   Take 1 tablet (10 mg total) by mouth 2 (two) times daily.   180 tablet   0   . Calcium Carbonate-Vitamin D (CALCIUM 600 + D PO)   Oral   Take 1 tablet by mouth daily.         . Cholecalciferol (VITAMIN D) 2000 UNITS CAPS   Oral   Take 1 capsule by mouth daily.         . cyanocobalamin 100 MCG tablet   Oral   Take 100 mcg by mouth daily.         . diphenhydrAMINE (BENADRYL) 25 mg capsule   Oral   Take 25 mg by mouth at bedtime.         . fluticasone (FLONASE) 50 MCG/ACT nasal spray   Nasal   Place 1 spray into the nose daily.         [name] GELATIN PO   Oral   Take 2 capsules by mouth daily.         [name] HYALURONIC ACID-VITAMIN C PO   Oral   Take 1 capsule by mouth daily.         .  Lactobacillus (ACIDOPHILUS PO)   Oral   Take 1 capsule by mouth daily.         [name] lamoTRIgine (LAMICTAL) 150 MG tablet   Oral   Take 1 tablet (150 mg total) by mouth daily.   90 tablet   0   . Magnesium Oxide 250 MG TABS   Oral   Take 1 tablet by mouth daily.         . Melatonin 3 MG TABS   Oral   Take 3 mg by mouth at bedtime.          . Multiple Vitamins-Minerals (MULTIVITAMIN WITH MINERALS) tablet   Oral   Take 1 tablet by mouth daily.         [name] ofloxacin (FLOXIN) 0.3 % otic solution   Right Ear   Place 5 drops into the right ear 2 (two) times daily.   5 mL   0   . pravastatin (PRAVACHOL) 40 MG tablet   Oral   Take 40 mg by mouth daily.         . Probiotic Product (PROBIOTIC DAILY) CAPS   Oral   Take 2 capsules by mouth at bedtime.         [name] UNABLE TO FIND      Med Name: Vinpocetine capsule daily          BP 135/77  Pulse 91  Temp(Src) 99.5 F (37.5 C) (Oral)  Resp 16  SpO2 98% Physical Exam  Nursing note and vitals reviewed. Constitutional: She is oriented to person, place, and time. She appears well-developed. No distress.  Neck: Normal range of motion. Neck supple.  Cardiovascular: Normal rate and normal heart sounds.   Pulmonary/Chest: Effort normal and breath sounds normal. No respiratory distress. She has no wheezes.  Abdominal: Soft. There is tenderness. There is no rebound.  Marked tenderness in the left upper quadrant.  Neurological: She is alert and oriented to person, place, and time. She exhibits normal muscle tone.  Skin:  Skin is warm and dry.  Psychiatric: She has a normal mood and affect.    ED Course  Procedures (including critical care time) Labs Review Labs Reviewed - No data to display Imaging Review No results found.    MDM   1. Acute left lower quadrant pain      Transferring 58 year old morbidly obese female with multiple diagnoses to include cerebral palsy ,acromegaly, history of several vaginal and  other infections, UTI and anxiety and depression for evaluation of left lower quadrant pain with temperature of 99.5. The patient is confined to the wheelchair and she is unable to get onto an exam table or provide a urine for urinalysis.    Janne Napoleon, NP 11/17/13 1946

## 2013-11-17 NOTE — ED Notes (Signed)
C/o left flank pain.  Denies urinary symptoms and injury.  Pain on set Saturday and gradually getting worse.  Pt has not tried any otc pain meds.

## 2013-11-17 NOTE — ED Notes (Signed)
Patient is alert and oriented x3.  She is complaining left lower quadrant pain that started Saturday and has progressively  Gotten worse.  She adds that the pain is intermittent but will hurt on palpation.  She describes it as a stabbing pain.  Currently She rates the pain 9 of 10.

## 2013-11-18 ENCOUNTER — Encounter (HOSPITAL_COMMUNITY): Payer: Self-pay

## 2013-11-18 ENCOUNTER — Emergency Department (HOSPITAL_COMMUNITY): Payer: PRIVATE HEALTH INSURANCE

## 2013-11-18 LAB — CBC WITH DIFFERENTIAL/PLATELET
BASOS PCT: 0 % (ref 0–1)
Basophils Absolute: 0.1 10*3/uL (ref 0.0–0.1)
EOS PCT: 1 % (ref 0–5)
Eosinophils Absolute: 0.2 10*3/uL (ref 0.0–0.7)
HCT: 45.2 % (ref 36.0–46.0)
Hemoglobin: 15.4 g/dL — ABNORMAL HIGH (ref 12.0–15.0)
Lymphocytes Relative: 24 % (ref 12–46)
Lymphs Abs: 3.3 10*3/uL (ref 0.7–4.0)
MCH: 28.8 pg (ref 26.0–34.0)
MCHC: 34.1 g/dL (ref 30.0–36.0)
MCV: 84.5 fL (ref 78.0–100.0)
Monocytes Absolute: 1.2 10*3/uL — ABNORMAL HIGH (ref 0.1–1.0)
Monocytes Relative: 9 % (ref 3–12)
NEUTROS PCT: 65 % (ref 43–77)
Neutro Abs: 8.9 10*3/uL — ABNORMAL HIGH (ref 1.7–7.7)
PLATELETS: 338 10*3/uL (ref 150–400)
RBC: 5.35 MIL/uL — AB (ref 3.87–5.11)
RDW: 13.1 % (ref 11.5–15.5)
WBC: 13.6 10*3/uL — ABNORMAL HIGH (ref 4.0–10.5)

## 2013-11-18 LAB — COMPREHENSIVE METABOLIC PANEL
ALK PHOS: 102 U/L (ref 39–117)
ALT: 44 U/L — AB (ref 0–35)
AST: 32 U/L (ref 0–37)
Albumin: 3.9 g/dL (ref 3.5–5.2)
BUN: 8 mg/dL (ref 6–23)
CO2: 27 mEq/L (ref 19–32)
Calcium: 9.4 mg/dL (ref 8.4–10.5)
Chloride: 96 mEq/L (ref 96–112)
Creatinine, Ser: 0.69 mg/dL (ref 0.50–1.10)
GFR calc non Af Amer: >90 mL/min (ref >90)
Glucose, Bld: 173 mg/dL — ABNORMAL HIGH (ref 70–99)
POTASSIUM: 2.7 meq/L — AB (ref 3.7–5.3)
SODIUM: 141 meq/L (ref 137–147)
TOTAL PROTEIN: 7.7 g/dL (ref 6.0–8.3)
Total Bilirubin: 0.4 mg/dL (ref 0.3–1.2)

## 2013-11-18 MED ORDER — KETOROLAC TROMETHAMINE 30 MG/ML IJ SOLN
30.0000 mg | Freq: Once | INTRAMUSCULAR | Status: AC
  Administered 2013-11-18: 30 mg via INTRAVENOUS
  Filled 2013-11-18: qty 1

## 2013-11-18 MED ORDER — SODIUM CHLORIDE 0.9 % IV SOLN
Freq: Once | INTRAVENOUS | Status: AC
  Administered 2013-11-18: 02:00:00 via INTRAVENOUS

## 2013-11-18 MED ORDER — IOHEXOL 300 MG/ML  SOLN
50.0000 mL | Freq: Once | INTRAMUSCULAR | Status: AC | PRN
  Administered 2013-11-18: 50 mL via ORAL

## 2013-11-18 MED ORDER — MORPHINE SULFATE 4 MG/ML IJ SOLN
6.0000 mg | Freq: Once | INTRAMUSCULAR | Status: AC
  Administered 2013-11-18: 6 mg via INTRAVENOUS
  Filled 2013-11-18: qty 2

## 2013-11-18 MED ORDER — METRONIDAZOLE 500 MG PO TABS: 500.0000 mg | ORAL_TABLET | Freq: Two times a day (BID) | ORAL | Status: DC

## 2013-11-18 MED ORDER — ONDANSETRON 8 MG PO TBDP: 8.0000 mg | ORAL_TABLET | Freq: Three times a day (TID) | ORAL | Status: DC | PRN

## 2013-11-18 MED ORDER — IOHEXOL 300 MG/ML  SOLN
100.0000 mL | Freq: Once | INTRAMUSCULAR | Status: AC | PRN
  Administered 2013-11-18: 100 mL via INTRAVENOUS

## 2013-11-18 MED ORDER — POTASSIUM CHLORIDE CRYS ER 20 MEQ PO TBCR
40.0000 meq | EXTENDED_RELEASE_TABLET | Freq: Once | ORAL | Status: AC
  Administered 2013-11-18: 40 meq via ORAL
  Filled 2013-11-18: qty 2

## 2013-11-18 MED ORDER — CIPROFLOXACIN HCL 500 MG PO TABS: 500.0000 mg | ORAL_TABLET | Freq: Two times a day (BID) | ORAL | Status: DC

## 2013-11-18 MED ORDER — OXYCODONE-ACETAMINOPHEN 5-325 MG PO TABS: 1.0000 | ORAL_TABLET | ORAL | Status: DC | PRN

## 2013-11-18 NOTE — ED Provider Notes (Signed)
CSN: [id number]     Arrival date & time 11/17/13  2014 History   First MD Initiated Contact with Patient 11/18/13 0132     Chief Complaint  Patient presents with  . Abdominal Pain    The history is provided by the patient.   patient reports worsening left lower quadrant pain over the past 5-6 days.  She states her pain is progressively getting worse.  Her pain currently as a 9/10.  She's had nausea without vomiting.  She states movement and palpation seemed to make her left-sided abdominal pain worse.  No urinary complaints.  She denies vaginal complaints.  No fevers or chills.  No back pain or flank pain.  No prior history of similar symptoms.  Oral temperature 99.5 on arrival to the emergency department.  Past Medical History  Diagnosis Date  . Anxiety   . High cholesterol   . Cerebral palsy   . Borderline diabetes   . Melanoma in situ   . Dysplasia of cervix   . Neck mass   . IBS (irritable bowel syndrome)   . Acromegaly   . Headache(784.0)   . Bladder infection   . Colitis   . History of chicken pox   . History of mumps   . History of measles   . Yeast infection   . Bacterial infection   . Trichomonas   . Ovarian cyst   . Depression   . Arthritis   . GERD (gastroesophageal reflux disease)   . Kidney infection   . Benign paroxysmal positional vertigo 02/24/2013   Past Surgical History  Procedure Laterality Date  . Leg tendon surgery    . Knee rotation    . Rotator cuff surgery    . Appendectomy    . Carpal tunnel release    . Ovarian cyst removal    . Replacement total knee bilateral  2006   Family History  Problem Relation Age of Onset  . Suicidality Father   . Depression Father   . Alcohol abuse Father   . Drug abuse Brother   . Alcohol abuse Brother   . Esophageal cancer Brother   . Depression Maternal Aunt   . Alcohol abuse Brother   . Anxiety disorder Daughter   . Depression Daughter   . Suicidality Daughter   . Alcohol abuse Daughter   . Drug abuse  Son   . Alcohol abuse Son   . Emphysema Sister     smoked  . Emphysema Father     smoked   History  Substance Use Topics  . Smoking status: Former Smoker -- 1.00 packs/day for 31 years    Types: Cigarettes    Quit date: 11/18/1999  . Smokeless tobacco: Never Used  . Alcohol Use: No   OB History   Grav Para Term Preterm Abortions TAB SAB Ect Mult Living   3 2             Review of Systems  All other systems reviewed and are negative.    Allergies  Miconazole nitrate and Sulfonamide derivatives  Home Medications   Current Outpatient Rx  Name  Route  Sig  Dispense  Refill  . albuterol (PROAIR HFA) 108 (90 BASE) MCG/ACT inhaler   Inhalation   Inhale 2 puffs into the lungs every 6 (six) hours as needed for wheezing or shortness of breath.         [name] amitriptyline (ELAVIL) 50 MG tablet   Oral   Take 1 tablet (  50 mg total) by mouth at bedtime.   90 tablet   0   . atomoxetine (STRATTERA) 40 MG capsule   Oral   Take 1 capsule (40 mg total) by mouth daily.   90 capsule   0   . busPIRone (BUSPAR) 10 MG tablet   Oral   Take 1 tablet (10 mg total) by mouth 2 (two) times daily.   180 tablet   0   . Calcium Carbonate-Vitamin D (CALCIUM 600 + D PO)   Oral   Take 1 tablet by mouth daily.         . Cholecalciferol (VITAMIN D) 2000 UNITS CAPS   Oral   Take 1 capsule by mouth daily.         . cyanocobalamin 100 MCG tablet   Oral   Take 100 mcg by mouth daily.         . diphenhydrAMINE (BENADRYL) 25 mg capsule   Oral   Take 25 mg by mouth at bedtime.         . fluticasone (FLONASE) 50 MCG/ACT nasal spray   Nasal   Place 1 spray into the nose daily.         . furosemide (LASIX) 80 MG tablet   Oral   Take 80 mg by mouth daily.          [name] GELATIN PO   Oral   Take 2 capsules by mouth daily.         [name] HYALURONIC ACID-VITAMIN C PO   Oral   Take 1 capsule by mouth daily.         . Lactobacillus (ACIDOPHILUS PO)   Oral   Take 1 capsule by  mouth daily.         [name] lamoTRIgine (LAMICTAL) 150 MG tablet   Oral   Take 1 tablet (150 mg total) by mouth daily.   90 tablet   0   . Magnesium Oxide 250 MG TABS   Oral   Take 1 tablet by mouth daily.         . Melatonin 3 MG TABS   Oral   Take 3 mg by mouth at bedtime.          . Multiple Vitamins-Minerals (MULTIVITAMIN WITH MINERALS) tablet   Oral   Take 1 tablet by mouth daily.         . potassium chloride SA (K-DUR,KLOR-CON) 20 MEQ tablet   Oral   Take 20 mEq by mouth daily.          . pravastatin (PRAVACHOL) 40 MG tablet   Oral   Take 40 mg by mouth daily.         . Probiotic Product (PROBIOTIC DAILY) CAPS   Oral   Take 2 capsules by mouth at bedtime.         [name] UNABLE TO FIND      Med Name: Vinpocetine capsule daily         . ciprofloxacin (CIPRO) 500 MG tablet   Oral   Take 1 tablet (500 mg total) by mouth 2 (two) times daily.   14 tablet   0   . metroNIDAZOLE (FLAGYL) 500 MG tablet   Oral   Take 1 tablet (500 mg total) by mouth 2 (two) times daily.   14 tablet   0   . ondansetron (ZOFRAN ODT) 8 MG disintegrating tablet   Oral   Take 1 tablet (8 mg total) by mouth every 8 (eight) hours  as needed for nausea or vomiting.   10 tablet   0   . oxyCODONE-acetaminophen (PERCOCET/ROXICET) 5-325 MG per tablet   Oral   Take 1 tablet by mouth every 4 (four) hours as needed for severe pain.   20 tablet   0    BP 127/60  Pulse 84  Temp(Src) 99 F (37.2 C) (Oral)  Resp 16  Ht 5\' 7"  (1.702 m)  Wt 217 lb (98.431 kg)  BMI 33.98 kg/m2  SpO2 93% Physical Exam  Nursing note and vitals reviewed. Constitutional: She is oriented to person, place, and time. She appears well-developed and well-nourished. No distress.  HENT:  Head: Normocephalic and atraumatic.  Eyes: EOM are normal.  Neck: Normal range of motion.  Cardiovascular: Normal rate, regular rhythm and normal heart sounds.   Pulmonary/Chest: Effort normal and breath sounds normal.   Abdominal: Soft. She exhibits no distension.  Left-sided abdominal tenderness without guarding or rebound  Musculoskeletal: Normal range of motion.  Neurological: She is alert and oriented to person, place, and time.  Skin: Skin is warm and dry.  Psychiatric: She has a normal mood and affect. Judgment normal.    ED Course  Procedures (including critical care time) Labs Review Labs Reviewed  CBC WITH DIFFERENTIAL - Abnormal; Notable for the following:    WBC 13.6 (*)    RBC 5.35 (*)    Hemoglobin 15.4 (*)    Neutro Abs 8.9 (*)    Monocytes Absolute 1.2 (*)    All other components within normal limits  COMPREHENSIVE METABOLIC PANEL - Abnormal; Notable for the following:    Potassium 2.7 (*)    Glucose, Bld 173 (*)    ALT 44 (*)    All other components within normal limits  URINALYSIS, ROUTINE W REFLEX MICROSCOPIC   Imaging Review Ct Abdomen Pelvis W Contrast  11/18/2013   CLINICAL DATA:  Left lower quadrant pain history of appendectomy and ovarian cyst removal. History of colitis.  EXAM: CT ABDOMEN AND PELVIS WITH CONTRAST  TECHNIQUE: Multidetector CT imaging of the abdomen and pelvis was performed using the standard protocol following bolus administration of intravenous contrast.  CONTRAST:  132mL OMNIPAQUE IOHEXOL 300 MG/ML  SOLN  COMPARISON:  CT of the chest, abdomen and July 07, 2013  FINDINGS: Included view of the lung bases demonstrate dependent atelectasis. Included heart and pericardium are unremarkable.  The liver is diffusely hypodense most consistent with fatty infiltration and otherwise unremarkable ; minimal focal fatty sparing about the gallbladder fossa. The spleen, pancreas, gallbladder and adrenal glands are unremarkable.  Trace hiatal hernia. Contrast filled stomach. The small large bowel are normal in core setting caliber. Focal inflammatory changes inferior to the descending colon, axial 51/89 and abutting the anterior abdominal wall. No intraperitoneal free fluid,  free air or focal fluid collections.  Kidneys are well located, 3 mm right lower pole renal calculus. No hydronephrosis or renal masses. Delayed imaging demonstrates prompt symmetric excretion of contrast in the proximal urinary collecting system. Ureters are normal in course and caliber. Urinary bladder is well distended and unremarkable.  Aorta is normal in course and caliber with mild calcific atherosclerosis. Low-density contents within the central uterus measure up to 13 mm, increased from prior examination.  Soft tissues are nonsuspicious. Mild degenerative change of the lumbar spine. Suture material along the anterior abdominal wall.  IMPRESSION: Descending colonic epiploic appendagitis without bowel obstruction or colitis.  3 mm nonobstructing right lower pole renal calculus.  Mild fluid distended endometrium would  be better characterized on pelvic sonogram in this presumably postmenopausal patient.   Electronically Signed   By: Elon Alas   On: 11/18/2013 05:13  I personally reviewed the imaging tests through PACS system I reviewed available ER/hospitalization records through the EMR   EKG Interpretation   None       MDM   1. Epiploic appendagitis    7:19 AM Patient feels much better at this time.  CT scan demonstrates evidence of descending colonic epiploic appendicitis without evidence of colitis.  Patient be referred to GI as an outpatient.  This will likely resolve on its own.  Home with symptomatic treatment and a short course of abx.  She understands to return to the ER for new or worsening symptoms.    Hoy Morn, MD 11/18/13 [phone]

## 2013-11-18 NOTE — ED Notes (Signed)
Pt. Refused to get into gown. RN, Cheral Bay made aware.

## 2013-11-18 NOTE — ED Notes (Signed)
Pt unable to void 

## 2013-12-14 ENCOUNTER — Ambulatory Visit (HOSPITAL_COMMUNITY): Payer: Self-pay | Admitting: Psychiatry

## 2013-12-30 ENCOUNTER — Ambulatory Visit (INDEPENDENT_AMBULATORY_CARE_PROVIDER_SITE_OTHER): Payer: PRIVATE HEALTH INSURANCE | Admitting: Psychiatry

## 2013-12-30 ENCOUNTER — Encounter (HOSPITAL_COMMUNITY): Payer: Self-pay | Admitting: Psychiatry

## 2013-12-30 VITALS — BP 124/76 | HR 88 | Ht 67.0 in

## 2013-12-30 DIAGNOSIS — F909 Attention-deficit hyperactivity disorder, unspecified type: Secondary | ICD-10-CM

## 2013-12-30 DIAGNOSIS — F319 Bipolar disorder, unspecified: Secondary | ICD-10-CM

## 2013-12-30 MED ORDER — LAMOTRIGINE 150 MG PO TABS: 150.0000 mg | ORAL_TABLET | Freq: Every day | ORAL | Status: DC

## 2013-12-30 MED ORDER — BUSPIRONE HCL 10 MG PO TABS: 10.0000 mg | ORAL_TABLET | Freq: Two times a day (BID) | ORAL | Status: DC

## 2013-12-30 MED ORDER — ATOMOXETINE HCL 40 MG PO CAPS: 40.0000 mg | ORAL_CAPSULE | Freq: Every day | ORAL | Status: DC

## 2013-12-30 MED ORDER — AMITRIPTYLINE HCL 50 MG PO TABS
50.0000 mg | ORAL_TABLET | Freq: Every day | ORAL | Status: DC
Start: 2013-12-30 — End: 2014-04-01
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
## 2014-01-07 ENCOUNTER — Other Ambulatory Visit (HOSPITAL_COMMUNITY): Payer: Self-pay | Admitting: Psychiatry

## 2014-04-01 ENCOUNTER — Ambulatory Visit (INDEPENDENT_AMBULATORY_CARE_PROVIDER_SITE_OTHER): Payer: PRIVATE HEALTH INSURANCE | Admitting: Psychiatry

## 2014-04-01 ENCOUNTER — Encounter (INDEPENDENT_AMBULATORY_CARE_PROVIDER_SITE_OTHER): Payer: Self-pay

## 2014-04-01 ENCOUNTER — Ambulatory Visit (HOSPITAL_COMMUNITY): Payer: Self-pay | Admitting: Psychiatry

## 2014-04-01 ENCOUNTER — Encounter (HOSPITAL_COMMUNITY): Payer: Self-pay | Admitting: Psychiatry

## 2014-04-01 VITALS — BP 125/76 | HR 83 | Ht 67.0 in

## 2014-04-01 DIAGNOSIS — F319 Bipolar disorder, unspecified: Secondary | ICD-10-CM

## 2014-04-01 DIAGNOSIS — F909 Attention-deficit hyperactivity disorder, unspecified type: Secondary | ICD-10-CM

## 2014-04-01 MED ORDER — AMITRIPTYLINE HCL 50 MG PO TABS: 50.0000 mg | ORAL_TABLET | Freq: Every day | ORAL | Status: DC

## 2014-04-01 MED ORDER — BUSPIRONE HCL 10 MG PO TABS: 10.0000 mg | ORAL_TABLET | Freq: Two times a day (BID) | ORAL | Status: DC

## 2014-04-01 MED ORDER — LAMOTRIGINE 150 MG PO TABS: 150.0000 mg | ORAL_TABLET | Freq: Every day | ORAL | Status: DC

## 2014-04-01 MED ORDER — ATOMOXETINE HCL 40 MG PO CAPS: 40.0000 mg | ORAL_CAPSULE | Freq: Every day | ORAL | Status: DC
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
## 2014-05-25 ENCOUNTER — Encounter (HOSPITAL_COMMUNITY): Payer: Self-pay | Admitting: Emergency Medicine

## 2014-05-25 ENCOUNTER — Emergency Department (INDEPENDENT_AMBULATORY_CARE_PROVIDER_SITE_OTHER)
Admission: EM | Admit: 2014-05-25 | Discharge: 2014-05-25 | Disposition: A | Discharge status: Home / Self Care | Payer: PRIVATE HEALTH INSURANCE | Source: Home / Self Care | Attending: Family Medicine | Admitting: Family Medicine

## 2014-05-25 DIAGNOSIS — H00019 Hordeolum externum unspecified eye, unspecified eyelid: Secondary | ICD-10-CM

## 2014-05-25 DIAGNOSIS — H00013 Hordeolum externum right eye, unspecified eyelid: Secondary | ICD-10-CM

## 2014-05-25 NOTE — ED Provider Notes (Signed)
CSN: [id number]     Arrival date & time 05/25/14  1628 History   First MD Initiated Contact with Patient 05/25/14 1720     Chief Complaint  Patient presents with  . Eye Problem   (Consider location/radiation/quality/duration/timing/severity/associated sxs/prior Treatment) HPI  Eye irritation: red and painful this morning. Near the tearduct. R eye. No change in pain since this am. No change in vision. Eye goopy this morning. Denies recent URI or allergies. Denies fevers, rash, or HA.    Past Medical History  Diagnosis Date  . Anxiety   . High cholesterol   . Cerebral palsy   . Borderline diabetes   . Melanoma in situ   . Dysplasia of cervix   . Neck mass   . IBS (irritable bowel syndrome)   . Acromegaly   . Headache(784.0)   . Bladder infection   . Colitis   . History of chicken pox   . History of mumps   . History of measles   . Yeast infection   . Bacterial infection   . Trichomonas   . Ovarian cyst   . Depression   . Arthritis   . GERD (gastroesophageal reflux disease)   . Kidney infection   . Benign paroxysmal positional vertigo 02/24/2013   Past Surgical History  Procedure Laterality Date  . Leg tendon surgery    . Knee rotation    . Rotator cuff surgery    . Appendectomy    . Carpal tunnel release    . Ovarian cyst removal    . Replacement total knee bilateral  2006   Family History  Problem Relation Age of Onset  . Suicidality Father   . Depression Father   . Alcohol abuse Father   . Drug abuse Brother   . Alcohol abuse Brother   . Esophageal cancer Brother   . Depression Maternal Aunt   . Alcohol abuse Brother   . Anxiety disorder Daughter   . Depression Daughter   . Suicidality Daughter   . Alcohol abuse Daughter   . Drug abuse Son   . Alcohol abuse Son   . Emphysema Sister     smoked  . Emphysema Father     smoked   History  Substance Use Topics  . Smoking status: Former Smoker -- 1.00 packs/day for 31 years    Types: Cigarettes     Quit date: 11/18/1999  . Smokeless tobacco: Never Used  . Alcohol Use: No   OB History   Grav Para Term Preterm Abortions TAB SAB Ect Mult Living   3 2             Review of Systems Per HPI with all other pertinent systems negative.   Allergies  Miconazole nitrate and Sulfonamide derivatives  Home Medications   Prior to Admission medications   Medication Sig Start Date End Date Taking? Authorizing Provider  albuterol (PROAIR HFA) 108 (90 BASE) MCG/ACT inhaler Inhale 2 puffs into the lungs every 6 (six) hours as needed for wheezing or shortness of breath.    Historical Provider, MD  amitriptyline (ELAVIL) 50 MG tablet Take 1 tablet (50 mg total) by mouth at bedtime. 04/01/14   [name], MD  atomoxetine (STRATTERA) 40 MG capsule Take 1 capsule (40 mg total) by mouth daily. 04/01/14   [name], MD  busPIRone (BUSPAR) 10 MG tablet Take 1 tablet (10 mg total) by mouth 2 (two) times daily. 04/01/14   [name], MD  Calcium  Carbonate-Vitamin D (CALCIUM 600 + D PO) Take 1 tablet by mouth daily.    Historical Provider, MD  Cholecalciferol (VITAMIN D) 2000 UNITS CAPS Take 1 capsule by mouth daily.    Historical Provider, MD  ciprofloxacin (CIPRO) 500 MG tablet Take 1 tablet (500 mg total) by mouth 2 (two) times daily. 11/18/13   Hoy Morn, MD  cyanocobalamin 100 MCG tablet Take 100 mcg by mouth daily.    Historical Provider, MD  diphenhydrAMINE (BENADRYL) 25 mg capsule Take 25 mg by mouth at bedtime.    Historical Provider, MD  fluticasone (FLONASE) 50 MCG/ACT nasal spray Place 1 spray into the nose daily.    Historical Provider, MD  furosemide (LASIX) 80 MG tablet Take 80 mg by mouth daily.  10/26/13   Historical Provider, MD  GELATIN PO Take 2 capsules by mouth daily.    Historical Provider, MD  HYALURONIC ACID-VITAMIN C PO Take 1 capsule by mouth daily.    Historical Provider, MD  Lactobacillus (ACIDOPHILUS PO) Take 1 capsule by mouth daily.    Historical Provider, MD   lamoTRIgine (LAMICTAL) 150 MG tablet Take 1 tablet (150 mg total) by mouth daily. 04/01/14   [name], MD  Magnesium Oxide 250 MG TABS Take 1 tablet by mouth daily.    Historical Provider, MD  potassium chloride SA (K-DUR,KLOR-CON) 20 MEQ tablet Take 20 mEq by mouth daily.  11/15/13   Historical Provider, MD  pravastatin (PRAVACHOL) 40 MG tablet Take 40 mg by mouth daily.    Historical Provider, MD  Probiotic Product (PROBIOTIC DAILY) CAPS Take 2 capsules by mouth at bedtime.    Historical Provider, MD   BP 112/75  Pulse 81  Temp(Src) 98.9 F (37.2 C) (Oral)  Resp 12  SpO2 97% Physical Exam  Constitutional: She is oriented to person, place, and time. She appears well-developed and well-nourished. No distress.  HENT:  Head: Normocephalic and atraumatic.  Eyes: EOM are normal. Pupils are equal, round, and reactive to light. Right eye exhibits no discharge. Left eye exhibits no discharge. No scleral icterus.  R lower eyelid w/ mild swelling near the lid margin and about 1cm from the tearduct. No purulence noted. Minimal ttp. Conjunctiva nml  Neck: Normal range of motion.  Cardiovascular: Normal rate, normal heart sounds and intact distal pulses.   No murmur heard. Pulmonary/Chest: Effort normal.  Musculoskeletal: Normal range of motion.  Neurological: She is alert and oriented to person, place, and time. She exhibits normal muscle tone.  Skin: Skin is dry. She is not diaphoretic.  Psychiatric: She has a normal mood and affect. Her behavior is normal. Judgment and thought content normal.    ED Course  Procedures (including critical care time) Labs Review Labs Reviewed - No data to display  Imaging Review No results found.   MDM   1. Stye external, right    R lower lid stye - warm compresses, NSAIDs - return precautions given  Linna Darner, MD Family Medicine 05/25/2014, 5:57 PM      Waldemar Dickens, MD 05/25/14 [id number]

## 2014-05-25 NOTE — Discharge Instructions (Signed)
You have a stye of the eye. This should go away in a few days with warm compresses and ibuprofen. If you develop severe pain, fever, or difficulty seeing then go to the emergency room or come in to be seen as you will likely need antibiotics     Sty A sty (hordeolum) is an infection of a gland in the eyelid located at the base of the eyelash. A sty may develop a white or yellow head of pus. It can be puffy (swollen). Usually, the sty will burst and pus will come out on its own. They do not leave lumps in the eyelid once they drain. A sty is often confused with another form of cyst of the eyelid called a chalazion. Chalazions occur within the eyelid and not on the edge where the bases of the eyelashes are. They often are red, sore and then form firm lumps in the eyelid. CAUSES   Germs (bacteria).  Lasting (chronic) eyelid inflammation. SYMPTOMS   Tenderness, redness and swelling along the edge of the eyelid at the base of the eyelashes.  Sometimes, there is a white or yellow head of pus. It may or may not drain. DIAGNOSIS  An ophthalmologist will be able to distinguish between a sty and a chalazion and treat the condition appropriately.  TREATMENT   Styes are typically treated with warm packs (compresses) until drainage occurs.  In rare cases, medicines that kill germs (antibiotics) may be prescribed. These antibiotics may be in the form of drops, cream or pills.  If a hard lump has formed, it is generally necessary to do a small incision and remove the hardened contents of the cyst in a minor surgical procedure done in the office.  In suspicious cases, your caregiver may send the contents of the cyst to the lab to be certain that it is not a rare, but dangerous form of cancer of the glands of the eyelid. HOME CARE INSTRUCTIONS   Wash your hands often and dry them with a clean towel. Avoid touching your eyelid. This may spread the infection to other parts of the eye.  Apply heat to  your eyelid for 10 to 20 minutes, several times a day, to ease pain and help to heal it faster.  Do not squeeze the sty. Allow it to drain on its own. Wash your eyelid carefully 3 to 4 times per day to remove any pus. SEEK IMMEDIATE MEDICAL CARE IF:   Your eye becomes painful or puffy (swollen).  Your vision changes.  Your sty does not drain by itself within 3 days.  Your sty comes back within a short period of time, even with treatment.  You have redness (inflammation) around the eye.  You have a fever. Document Released: 07/25/2005 Document Revised: 01/07/2012 Document Reviewed: 01/29/2014 [facility] Patient Information 2015 [place], [place]. This information is not intended to replace advice given to you by your health care provider. Make sure you discuss any questions you have with your health care provider.

## 2014-05-25 NOTE — ED Notes (Signed)
Patient has redness and pain at edge of lower eyelid, towards inner aspect of eye.  No known injury

## 2014-06-08 ENCOUNTER — Encounter: Payer: Self-pay | Admitting: Gastroenterology

## 2014-07-06 ENCOUNTER — Ambulatory Visit (INDEPENDENT_AMBULATORY_CARE_PROVIDER_SITE_OTHER): Payer: PRIVATE HEALTH INSURANCE | Admitting: Psychiatry

## 2014-07-06 ENCOUNTER — Telehealth (HOSPITAL_COMMUNITY): Payer: Self-pay

## 2014-07-06 ENCOUNTER — Encounter (HOSPITAL_COMMUNITY): Payer: Self-pay | Admitting: Psychiatry

## 2014-07-06 ENCOUNTER — Telehealth (HOSPITAL_COMMUNITY): Payer: Self-pay | Admitting: Psychiatry

## 2014-07-06 VITALS — BP 115/72 | HR 80 | Ht 67.0 in

## 2014-07-06 DIAGNOSIS — F319 Bipolar disorder, unspecified: Secondary | ICD-10-CM | POA: Diagnosis not present

## 2014-07-06 DIAGNOSIS — F988 Other specified behavioral and emotional disorders with onset usually occurring in childhood and adolescence: Secondary | ICD-10-CM

## 2014-07-06 MED ORDER — BUSPIRONE HCL 10 MG PO TABS: 10.0000 mg | ORAL_TABLET | Freq: Two times a day (BID) | ORAL | Status: DC

## 2014-07-06 MED ORDER — AMITRIPTYLINE HCL 50 MG PO TABS: 50.0000 mg | ORAL_TABLET | Freq: Every day | ORAL | Status: DC

## 2014-07-06 MED ORDER — LAMOTRIGINE 150 MG PO TABS: 150.0000 mg | ORAL_TABLET | Freq: Every day | ORAL | Status: DC

## 2014-07-06 MED ORDER — ATOMOXETINE HCL 40 MG PO CAPS: 40.0000 mg | ORAL_CAPSULE | Freq: Every day | ORAL | Status: DC

## 2014-07-06 NOTE — Telephone Encounter (Signed)
I returned patient's phone call.  She is complaining of hallucination which he described as multiple colors changes and recently it has been more intense and more frequent.  The patient told that she has experiencing these hallucination for a long time but she never mentioned to anyone until it is more intense.  Recently she started tinazadine as a muscle relaxant.  I recommended to check with her primary care physician if this medicine is causing her worsening of hallucinations.  If it does not get better than she may need CT scan.  Patient promised that she would discuss with her primary care physician on her next appointment.
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
## 2014-07-23 ENCOUNTER — Telehealth (HOSPITAL_COMMUNITY): Payer: Self-pay | Admitting: *Deleted

## 2014-07-23 NOTE — Telephone Encounter (Signed)
Pt called stating Dr. Adele Schilder told her to get an appointment to see a neurologist but when she tried they told her she needs a referral from the Doctor to be sent to them. She is asking that a referral be sent to [facility] at [phone].

## 2014-08-05 ENCOUNTER — Ambulatory Visit: Payer: PRIVATE HEALTH INSURANCE | Admitting: Neurology

## 2014-08-06 ENCOUNTER — Encounter: Payer: Self-pay | Admitting: Neurology

## 2014-08-06 ENCOUNTER — Ambulatory Visit (INDEPENDENT_AMBULATORY_CARE_PROVIDER_SITE_OTHER): Payer: PRIVATE HEALTH INSURANCE | Admitting: Neurology

## 2014-08-06 VITALS — BP 125/74 | HR 83 | Resp 14 | Ht 67.0 in | Wt 221.8 lb

## 2014-08-06 DIAGNOSIS — R51 Headache: Secondary | ICD-10-CM

## 2014-08-06 DIAGNOSIS — R0683 Snoring: Secondary | ICD-10-CM

## 2014-08-06 DIAGNOSIS — E669 Obesity, unspecified: Secondary | ICD-10-CM

## 2014-08-06 DIAGNOSIS — R600 Localized edema: Secondary | ICD-10-CM

## 2014-08-06 DIAGNOSIS — G809 Cerebral palsy, unspecified: Secondary | ICD-10-CM

## 2014-08-06 DIAGNOSIS — F515 Nightmare disorder: Secondary | ICD-10-CM

## 2014-08-06 DIAGNOSIS — R519 Headache, unspecified: Secondary | ICD-10-CM

## 2014-08-06 DIAGNOSIS — G8929 Other chronic pain: Secondary | ICD-10-CM

## 2014-08-06 NOTE — Progress Notes (Signed)
Subjective:    Patient ID: [name] is a 58 y.o. female.  HPI    Star Age, MD, PhD [facility] [address] [place], [place] [address]  Dear Jack Quarto,   I saw your patient, [name], upon your kind request in my neurologic clinic today for initial consultation of her headaches. The patient is unaccompanied today. As you know, [name] is a 58 year old right-handed woman with underlying medical history of acromegaly, hyperlipidemia, cerebral palsy, bipolar d/o, emphysema, eczema, reflux disease, rosacea, irritable bowel syndrome, allergic rhinitis, obesity, and peripheral neuropathy, who I met in consultation in April 2014 for a different problem. She was reporting dizziness and vertigo at that time and was referred by her primary care provider. I suggested imaging testing. She had a brain MRI with and without contrast as well as a brain MRA without contrast on 03/10/2013: Equivocal MRI brain (with and without) demonstrating few minimal, punctate foci of non-specific gliosis. No acute findings. The brain MRA was normal.  We called her with the test results at the time.  She now presents with recurrent headaches which she describes as a squeezing sensation that lasts only for seconds. This can be on either side of the head. She does not have any associated nausea, vomiting, photophobia or sonophobia. She does not describe any new neurological focal issues. Headaches have been ongoing for perhaps years and she worries about a brain tumor. Of note, she was diagnosed with acromegaly years ago but never was proven to have a pituitary adenoma. She has also been experiencing recurrent dreams, nightmares, and nocturnal hallucinations which she is wondering are related to the headaches were a brain tumor. She describes some of the dreams to me and also brings a written account. They are disturbing and scary to her. They seem very real. You did not  think they were side effects of any of her medications. Of note, she endorses snoring and not sleeping well. She does not necessarily wake up with a headache. She has never had a sleep study.   Her Past Medical History Is Significant For: Past Medical History  Diagnosis Date  . Anxiety   . High cholesterol   . Cerebral palsy   . Borderline diabetes   . Melanoma in situ   . Dysplasia of cervix   . Neck mass   . IBS (irritable bowel syndrome)   . Acromegaly   . Headache(784.0)   . Bladder infection   . Colitis   . History of chicken pox   . History of mumps   . History of measles   . Yeast infection   . Bacterial infection   . Trichomonas   . Ovarian cyst   . Depression   . Arthritis   . GERD (gastroesophageal reflux disease)   . Kidney infection   . Benign paroxysmal positional vertigo 02/24/2013    Her Past Surgical History Is Significant For: Past Surgical History  Procedure Laterality Date  . Leg tendon surgery    . Knee rotation    . Rotator cuff surgery    . Appendectomy    . Carpal tunnel release    . Ovarian cyst removal    . Replacement total knee bilateral  2006    Her Family History Is Significant For: Family History  Problem Relation Age of Onset  . Suicidality Father   . Depression Father   . Alcohol abuse Father   . Drug abuse Brother   .  Alcohol abuse Brother   . Esophageal cancer Brother   . Depression Maternal Aunt   . Alcohol abuse Brother   . Anxiety disorder Daughter   . Depression Daughter   . Suicidality Daughter   . Alcohol abuse Daughter   . Drug abuse Son   . Alcohol abuse Son   . Emphysema Sister     smoked  . Emphysema Father     smoked    Her Social History Is Significant For: History   Social History  . Marital Status: Divorced    Spouse Name: N/A    Number of Children: N/A  . Years of Education: graduate   Occupational History  . Disabled    Social History Main Topics  . Smoking status: Former Smoker -- 1.00  packs/day for 31 years    Types: Cigarettes    Quit date: 11/18/1999  . Smokeless tobacco: Never Used  . Alcohol Use: No  . Drug Use: No  . Sexual Activity: No   Other Topics Concern  . None   Social History Narrative   Right handed, Disabled. Live boyfriend , [name], Caffeine 3-4 cups.     Her Allergies Are:  Allergies  Allergen Reactions  . Miconazole Nitrate Itching  . Sulfonamide Derivatives Rash  :   Her Current Medications Are:  Outpatient Encounter Prescriptions as of 08/06/2014  Medication Sig  . ACCU-CHEK AVIVA PLUS test strip   . alendronate (FOSAMAX) 70 MG tablet   . amitriptyline (ELAVIL) 50 MG tablet Take 1 tablet (50 mg total) by mouth at bedtime.  [name] atomoxetine (STRATTERA) 40 MG capsule Take 1 capsule (40 mg total) by mouth daily.  . Cholecalciferol (VITAMIN D) 2000 UNITS CAPS Take 1 capsule by mouth daily.  . cyanocobalamin 100 MCG tablet Take 100 mcg by mouth daily.  . fluticasone (FLONASE) 50 MCG/ACT nasal spray Place 1 spray into the nose daily.  . furosemide (LASIX) 80 MG tablet Take 120 mg by mouth daily.   [name] GARCINIA CAMBOGIA-CHROMIUM PO Take 100 mg by mouth daily.  [name] HYALURONIC ACID-VITAMIN C PO Take 1 capsule by mouth daily.  . Lactobacillus (ACIDOPHILUS PO) Take 1 capsule by mouth daily.  [name] lamoTRIgine (LAMICTAL) 150 MG tablet Take 1 tablet (150 mg total) by mouth daily.  . Magnesium Oxide 250 MG TABS Take 1 tablet by mouth daily.  [name] oxybutynin (DITROPAN) 5 MG tablet   . potassium chloride (MICRO-K) 10 MEQ CR capsule 20 mEq daily.   . Probiotic Product (PROBIOTIC DAILY) CAPS Take 2 capsules by mouth at bedtime.  [name] tiZANidine (ZANAFLEX) 4 MG tablet   . albuterol (PROAIR HFA) 108 (90 BASE) MCG/ACT inhaler Inhale 2 puffs into the lungs every 6 (six) hours as needed for wheezing or shortness of breath.  . Calcium Carbonate-Vitamin D (CALCIUM 600 + D PO) Take 1 tablet by mouth daily.  . diphenhydrAMINE (BENADRYL) 25 mg capsule Take 25 mg by  mouth at bedtime.  . promethazine (PHENERGAN) 25 MG tablet Take 25 mg by mouth every 6 (six) hours as needed for nausea or vomiting.  . [DISCONTINUED] busPIRone (BUSPAR) 10 MG tablet Take 1 tablet (10 mg total) by mouth 2 (two) times daily.  . [DISCONTINUED] pravastatin (PRAVACHOL) 40 MG tablet Take 40 mg by mouth daily.  :  Review of Systems:  Out of a complete 14 point review of systems, all are reviewed and negative with the exception of these symptoms as listed below:  Review of Systems  Constitutional:  Weight gain  HENT:       Spinning sensation, trouble swallowing  Eyes: Positive for visual disturbance.  Respiratory:       Snoring   Cardiovascular: Positive for leg swelling.  Gastrointestinal:       Incontinence   Musculoskeletal: Positive for myalgias.       Joint pain, joint swelling, cramps  Skin: Positive for rash.       itiching  Neurological: Positive for numbness and headaches.       Snoring, RLS  Psychiatric/Behavioral: Positive for hallucinations.       Depression anxiety     Objective:  Neurologic Exam  Physical Exam Physical Examination:   Filed Vitals:   08/06/14 1108  BP: 125/74  Pulse: 83  Resp: 14    General Examination: The patient is a 58 y.o. female in no acute distress. She appears well-developed and well-nourished and adequately groomed. She is obese. She situated in her motorized scooter.  HEENT: Normocephalic, atraumatic, pupils are equal, round and reactive to light and accommodation. Extraocular tracking is good without limitation to gaze excursion or nystagmus noted. She has no evidence of nystagmus. Normal smooth pursuit is noted. Hearing is grossly intact. Tympanic membranes are clear bilaterally. She wears corrective glasses. She does endorse not having had her eyes checked in over a year. Funduscopic exam is normal. Face is symmetric with normal facial animation and normal facial sensation. Speech is clear with no dysarthria  noted. She speaks softly. There is no lip, neck or jaw tremor. Neck is supple with full range of motion. There are no carotid bruits on auscultation. Oropharynx exam reveals: Moderate to severe mouth dryness, adequate dental hygiene and moderate airway crowding, due to redundant soft palate and large uvula. Mallampati is class II. Tongue protrudes centrally and palate elevates symmetrically.  her neck circumference is 16 inches. She has no overbite.   Chest: Clear to auscultation without wheezing, rhonchi or crackles noted.  Heart: S1+S2+0, regular and normal without murmurs, rubs or gallops noted.   Abdomen: Soft, non-tender and non-distended with normal bowel sounds appreciated on auscultation.  Extremities: There is 2+ pitting edema in the distal lower extremities bilaterally, up to the midcalf area.   Skin: Warm and dry without trophic changes noted.   Musculoskeletal: exam reveals no obvious joint deformities, tenderness or joint swelling or erythema.   Neurologically:  Mental status: The patient is awake, alert and oriented in all 4 spheres. Her memory, attention, language and knowledge are appropriate. There is no aphasia, agnosia, apraxia or anomia. Speech is clear with normal prosody and enunciation. Thought process is linear. Mood is congruent and affect is flat.  Cranial nerves are as described above under HEENT exam. In addition, shoulder shrug is normal with equal shoulder height noted.  Motor exam: Normal bulk, strength and tone is noted in the upper extremities. In the lower extremities she has 4+ out of 5 strength in the left lower extremity and 4 minus out of 5 strength in the right lower extremity. She has hyperreflexia in the knees bilaterally but absent ankle jerks, but most likely due to her swelling. There is no drift, tremor or rebound. she has a bilateral to beat ankle clonus. She has upgoing toes bilaterally. Reflexes are 2+ in the upper extremities. Fine motor skills are  intact with normal finger taps, normal hand movements, normal rapid alternating patting, but foot tapping and foot agility is difficult primarily due to weakness and swelling.  Cerebellar testing shows no dysmetria  or intention tremor on finger to nose testing. There is no truncal ataxia. I did not have her stand or walk for me as she did not bring her walker and she primarily uses her scooter. She does endorse being able to transfer to her bed at night. Sensory exam is intact to light touch, pinprick, vibration, temperature sense in the upper extremities.   Assessment and Plan:   In summary, ADALENA ABDULLA is a 58 year old female with a history of cerebral palsy, acromegaly, lower extremity edema, and multiple other medical problems who presents with a history of recurrent headaches which are very short lived. She describes a sudden squeezing sensation which only seems to last a few seconds. She denies any associated photophobia, sonophobia, nausea or vomiting. She has had these headaches for years but they seem to be worse. She also describes abnormal dreams including nightmares and went she feels could be nighttime hallucinations are very vivid dreams. I am not sure how to make a connection with these 2 issues. They may not be related I explained to her. She had a brain scan and brain MRA last year but since she is describing these headaches on a more recurrent basis I would suggest we do another brain MRI with and without contrast. Especially, since she was diagnosed with acromegaly in the past. In addition, she endorses snoring and not sleeping well. She has a tight looking airway. She may have underlying obstructive sleep apnea. photophobia, then after a few seconds results and new onset of vertigo. This most likely paroxysmal positional vertigo. I explained this diagnosis to her. If this persists I will send her to physical therapy. I explained to her that there is no specific medication for this. She  may need HEENT evaluation as well. At this juncture, I suggested an MRI and MRA of the brain. She was in agreement. I would like to see her back in 3 months, sooner if the need arises. She is strongly advised to address her severe lower extremity edema with you. She has not been taking her Lasix regularly.  Thank you very much for allowing me to participate in the care of this nice patient. If I can be of any further assistance to you please do not hesitate to call me at [phone].  Sincerely,   Star Age, MD, PhD

## 2014-08-06 NOTE — Patient Instructions (Addendum)
We will do a brain MRI with and without contrast.  Based on your symptoms and your exam I believe you are at risk for obstructive sleep apnea or OSA, and I think we should proceed with a sleep study to determine whether you do or do not have OSA and how severe it is. If you have more than mild OSA, I want you to consider treatment with CPAP. Please remember, the risks and ramifications of moderate to severe obstructive sleep apnea or OSA are: Cardiovascular disease, including congestive heart failure, stroke, difficult to control hypertension, arrhythmias, and even type 2 diabetes has been linked to untreated OSA. Sleep apnea causes disruption of sleep and sleep deprivation in most cases, which, in turn, can cause recurrent headaches, problems with memory, mood, concentration, focus, and vigilance. Most people with untreated sleep apnea report excessive daytime sleepiness, which can affect their ability to drive. Please do not drive if you feel sleepy.  I will see you back after your sleep study to go over the test results and where to go from there. We will call you after your sleep study and to set up an appointment at the time.   Please remember to try to maintain good sleep hygiene, which means: Keep a regular sleep and wake schedule, try not to exercise or have a meal within 2 hours of your bedtime, try to keep your bedroom conducive for sleep, that is, cool and dark, without light distractors such as an illuminated alarm clock, and refrain from watching TV right before sleep or in the middle of the night and do not keep the TV or radio on during the night. Also, try not to use or play on electronic devices at bedtime, such as your cell phone, tablet PC or laptop. If you like to read at bedtime on an electronic device, try to dim the background light as much as possible. Do not eat in the middle of the night.   Drink more water and reduce your caffeine intake.   Please remember, common headache triggers  are: sleep deprivation, dehydration, overheating, stress, hypoglycemia or skipping meals and blood sugar fluctuations, excessive pain medications or excessive alcohol use or caffeine withdrawal. Some people have food triggers such as aged cheese, orange juice or chocolate, especially dark chocolate, or MSG (monosodium glutamate). Try to avoid these headache triggers as much possible. It may be helpful to keep a headache diary to figure out what makes your headaches worse or brings them on and what alleviates them. Some people report headache onset after exercise but studies have shown that regular exercise may actually prevent headaches from coming. If you have exercise-induced headaches, please make sure that you drink plenty of fluid before and after exercising and that you do not over do it and do not overheat.

## 2014-08-10 ENCOUNTER — Ambulatory Visit: Payer: Self-pay | Admitting: Gastroenterology

## 2014-08-13 ENCOUNTER — Encounter: Payer: Self-pay | Admitting: Gastroenterology

## 2014-08-13 ENCOUNTER — Ambulatory Visit (INDEPENDENT_AMBULATORY_CARE_PROVIDER_SITE_OTHER): Payer: PRIVATE HEALTH INSURANCE | Admitting: Gastroenterology

## 2014-08-13 VITALS — BP 136/80 | HR 98 | Ht 67.0 in | Wt 214.0 lb

## 2014-08-13 DIAGNOSIS — B89 Unspecified parasitic disease: Secondary | ICD-10-CM

## 2014-08-13 NOTE — Patient Instructions (Addendum)
You were offered routine stool testing, but declined. We will refer you to Infectious Disease clinic to consider other testing for the parasites you believe you have. You will be called by Infectious Disease regarding your appointment once records are reviewed by the physicians in that office.

## 2014-08-13 NOTE — Progress Notes (Signed)
Review of pertinent gastrointestinal problems: 1. Increased risk for colon and gastric cancer given underlying acromegaly:  Colonoscopy, EGD 04/2012 Dr. Ardis Hughs were both normal, recommended recalls at 5 years.   HPI: This is a  58 year old woman I last saw colonoscopy and upper endoscopy. The those results summarized above  She thinks she has a fluke, parasite.  Saw a large cucumber seed in a BM.  Like a condom.  This was after a 'detox.'  She saw her PCP and he gave her a  Prescription for something.  She saw a gastroenterologist at [facility] and they did not feel she had parasitosis clinically.      Review of systems: Pertinent positive and negative review of systems were noted in the above HPI section. Complete review of systems was performed and was otherwise normal.    Past Medical History  Diagnosis Date  . Anxiety   . High cholesterol   . Cerebral palsy   . Borderline diabetes   . Melanoma in situ   . Dysplasia of cervix   . Neck mass   . IBS (irritable bowel syndrome)   . Acromegaly   . Headache(784.0)   . Bladder infection   . Colitis   . History of chicken pox   . History of mumps   . History of measles   . Yeast infection   . Bacterial infection   . Trichomonas   . Ovarian cyst   . Depression   . Arthritis   . GERD (gastroesophageal reflux disease)   . Kidney infection   . Benign paroxysmal positional vertigo 02/24/2013    Past Surgical History  Procedure Laterality Date  . Leg tendon surgery    . Knee rotation    . Rotator cuff surgery    . Appendectomy    . Carpal tunnel release    . Ovarian cyst removal    . Replacement total knee bilateral  2006    Current Outpatient Prescriptions  Medication Sig Dispense Refill  . ACCU-CHEK AVIVA PLUS test strip       . albuterol (PROAIR HFA) 108 (90 BASE) MCG/ACT inhaler Inhale 2 puffs into the lungs every 6 (six) hours as needed for wheezing or shortness of breath.      [name] alendronate (FOSAMAX) 70 MG  tablet       . amitriptyline (ELAVIL) 50 MG tablet Take 1 tablet (50 mg total) by mouth at bedtime.  90 tablet  0  . atomoxetine (STRATTERA) 40 MG capsule Take 1 capsule (40 mg total) by mouth daily.  90 capsule  0  . Calcium Carbonate-Vitamin D (CALCIUM 600 + D PO) Take 1 tablet by mouth daily.      . Cholecalciferol (VITAMIN D) 2000 UNITS CAPS Take 1 capsule by mouth daily.      . Chromium Picolinate 200 MCG CAPS Take 250 capsules by mouth daily.      . cyanocobalamin 100 MCG tablet Take 100 mcg by mouth daily.      . diphenhydrAMINE (BENADRYL) 25 mg capsule Take 25 mg by mouth at bedtime.      . fluticasone (FLONASE) 50 MCG/ACT nasal spray Place 1 spray into the nose daily.      [name] FOLIC ACID PO Take 353 mg by mouth daily.      . furosemide (LASIX) 80 MG tablet Take 120 mg by mouth daily.       [name] GARCINIA CAMBOGIA-CHROMIUM PO Take 100 mg by mouth daily.      [name]  Glucosamine Sulfate 500 MG CAPS Take 500 mg by mouth daily.      . Horse Chestnut 300 MG CAPS Take 300 mg by mouth daily.      [name] Hyaluronic Acid-Vitamin C (HYALURONIC ACID PO) Take 100 mg by mouth daily.      [name] HYALURONIC ACID-VITAMIN C PO Take 1 capsule by mouth daily.      . Lactobacillus (ACIDOPHILUS PO) Take 1 capsule by mouth daily.      [name] lamoTRIgine (LAMICTAL) 150 MG tablet Take 1 tablet (150 mg total) by mouth daily.  90 tablet  0  . Magnesium Oxide 250 MG TABS Take 1 tablet by mouth daily.      . Melatonin 3 MG CAPS Take 6 mg by mouth daily.      . niacin 100 MG tablet Take 200 mg by mouth at bedtime.      [name] oxybutynin (DITROPAN) 5 MG tablet       . potassium chloride (MICRO-K) 10 MEQ CR capsule 20 mEq daily.       . Probiotic Product (PROBIOTIC DAILY) CAPS Take 2 capsules by mouth at bedtime.      . promethazine (PHENERGAN) 25 MG tablet Take 25 mg by mouth every 6 (six) hours as needed for nausea or vomiting.      Lia Hopping Leaf POWD 150 mg by Does not apply route daily.      [name] tiZANidine (ZANAFLEX) 4 MG tablet       .  Tyrosine 500 MG CAPS Take 500 mg by mouth daily.       No current facility-administered medications for this visit.    Allergies as of 08/13/2014 - Review Complete 08/13/2014  Allergen Reaction Noted  . Miconazole nitrate Itching 11/21/2011  . Sulfonamide derivatives Rash     Family History  Problem Relation Age of Onset  . Suicidality Father   . Depression Father   . Alcohol abuse Father   . Drug abuse Brother   . Alcohol abuse Brother   . Esophageal cancer Brother   . Depression Maternal Aunt   . Alcohol abuse Brother   . Anxiety disorder Daughter   . Depression Daughter   . Suicidality Daughter   . Alcohol abuse Daughter   . Drug abuse Son   . Alcohol abuse Son   . Emphysema Sister     smoked  . Emphysema Father     smoked    History   Social History  . Marital Status: Divorced    Spouse Name: N/A    Number of Children: N/A  . Years of Education: graduate   Occupational History  . Disabled    Social History Main Topics  . Smoking status: Former Smoker -- 1.00 packs/day for 31 years    Types: Cigarettes    Quit date: 11/18/1999  . Smokeless tobacco: Never Used  . Alcohol Use: No  . Drug Use: No  . Sexual Activity: No   Other Topics Concern  . Not on file   Social History Narrative   Right handed, Disabled. Live boyfriend , [name], Caffeine 3-4 cups.        Physical Exam: BP 136/80  Pulse 98  Ht 5\' 7"  (1.702 m)  Wt 214 lb (97.07 kg)  BMI 33.51 kg/m2  SpO2 99% Constitutional: generally well-appearing Psychiatric: alert and oriented x3 Eyes: extraocular movements intact Mouth: oral pharynx moist, no lesions Neck: supple no lymphadenopathy Cardiovascular: heart regular rate and rhythm Lungs: clear to auscultation bilaterally  Abdomen: soft, nontender, nondistended, no obvious ascites, no peritoneal signs, normal bowel sounds Extremities: no lower extremity edema bilaterally Skin: no lesions on visible extremities    Assessment  and plan: 58 y.o. 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 She is however convinced that she has the liver fluke, intestinal parasitosis. Routine (sites have been negative. She wants me to treat her parasitosis I would not do that without more evidence that she has actual parasites. She has 0 symptoms. She has a history of psychiatric disorders. I think it is highly unlikely that she has any intestinal infection. I did offer her infectious disease referral since she is convinced of her ailment and she accepted.

## 2014-08-24 ENCOUNTER — Other Ambulatory Visit: Payer: Self-pay

## 2014-08-30 ENCOUNTER — Encounter: Payer: Self-pay | Admitting: Gastroenterology

## 2014-08-31 ENCOUNTER — Other Ambulatory Visit: Payer: Self-pay

## 2014-09-06 ENCOUNTER — Ambulatory Visit
Admission: RE | Admit: 2014-09-06 | Discharge: 2014-09-06 | Disposition: A | Discharge status: Home / Self Care | Payer: PRIVATE HEALTH INSURANCE | Source: Ambulatory Visit | Attending: Neurology | Admitting: Neurology

## 2014-09-06 DIAGNOSIS — F515 Nightmare disorder: Secondary | ICD-10-CM

## 2014-09-06 DIAGNOSIS — E669 Obesity, unspecified: Secondary | ICD-10-CM

## 2014-09-06 DIAGNOSIS — R0683 Snoring: Secondary | ICD-10-CM

## 2014-09-06 DIAGNOSIS — R600 Localized edema: Secondary | ICD-10-CM

## 2014-09-06 DIAGNOSIS — R51 Headache: Principal | ICD-10-CM

## 2014-09-06 DIAGNOSIS — G809 Cerebral palsy, unspecified: Secondary | ICD-10-CM

## 2014-09-06 DIAGNOSIS — G8929 Other chronic pain: Secondary | ICD-10-CM

## 2014-09-06 MED ORDER — GADOBENATE DIMEGLUMINE 529 MG/ML IV SOLN
20.0000 mL | Freq: Once | INTRAVENOUS | Status: AC | PRN
  Administered 2014-09-06: 20 mL via INTRAVENOUS

## 2014-09-09 NOTE — Progress Notes (Signed)
Quick Note:  Please call patient regarding the recent brain MRI: The brain scan showed a normal structure of the brain and no volume loss which we call atrophy. There were changes in the deeper structures of the brain, which we call white matter changes or microvascular changes. These were reported as tiny in Her case. These are tiny white spots, that occur with time and are seen in a variety of conditions, including with normal aging, chronic hypertension, chronic headaches, especially migraine HAs, chronic diabetes, chronic hyperlipidemia. These are not strokes and no mass or lesion or contrast enhancement was seen which is reassuring. Again, there were no acute findings, such as a stroke, or mass or blood products. No further action is required on this test at this time, other than re-enforcing the importance of good blood pressure control, good cholesterol control, good blood sugar control, and weight management. Please remind patient to keep any upcoming appointments or tests and to call us with any interim questions, concerns, problems or updates.  Of note, overall no significant changes reported from an MRI brain from 03/10/2013, which is reassuring.   Thanks,  Star Age, MD, PhD    ______

## 2014-09-16 ENCOUNTER — Other Ambulatory Visit (HOSPITAL_COMMUNITY): Payer: Self-pay | Admitting: Psychiatry

## 2014-09-17 ENCOUNTER — Other Ambulatory Visit (HOSPITAL_COMMUNITY): Payer: Self-pay | Admitting: Psychiatry

## 2014-09-28 ENCOUNTER — Other Ambulatory Visit (HOSPITAL_COMMUNITY): Payer: Self-pay | Admitting: Psychiatry

## 2014-10-03 ENCOUNTER — Other Ambulatory Visit (HOSPITAL_COMMUNITY): Payer: Self-pay | Admitting: Psychiatry

## 2014-10-05 ENCOUNTER — Ambulatory Visit (INDEPENDENT_AMBULATORY_CARE_PROVIDER_SITE_OTHER): Payer: PRIVATE HEALTH INSURANCE | Admitting: Psychiatry

## 2014-10-05 ENCOUNTER — Encounter (HOSPITAL_COMMUNITY): Payer: Self-pay | Admitting: Psychiatry

## 2014-10-05 VITALS — BP 131/85 | HR 77 | Ht 67.0 in | Wt 218.0 lb

## 2014-10-05 DIAGNOSIS — F319 Bipolar disorder, unspecified: Secondary | ICD-10-CM | POA: Diagnosis not present

## 2014-10-05 DIAGNOSIS — F988 Other specified behavioral and emotional disorders with onset usually occurring in childhood and adolescence: Secondary | ICD-10-CM

## 2014-10-05 DIAGNOSIS — F909 Attention-deficit hyperactivity disorder, unspecified type: Secondary | ICD-10-CM

## 2014-10-05 MED ORDER — ATOMOXETINE HCL 40 MG PO CAPS: 40.0000 mg | ORAL_CAPSULE | Freq: Every day | ORAL | Status: DC

## 2014-10-05 MED ORDER — RISPERIDONE 0.5 MG PO TABS: 0.5000 mg | ORAL_TABLET | Freq: Every day | ORAL | Status: DC

## 2014-10-05 MED ORDER — LAMOTRIGINE 150 MG PO TABS: 150.0000 mg | ORAL_TABLET | Freq: Every day | ORAL | Status: DC
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
## 2014-10-06 ENCOUNTER — Telehealth (HOSPITAL_COMMUNITY): Payer: Self-pay | Admitting: *Deleted

## 2014-10-06 NOTE — Telephone Encounter (Signed)
Patient left VM: States she was given Risperdal prescription 10/05/14 at appt with Dr. Adele Schilder. Side effect on medicine is listed as swelling. She already has problems with swelling before this. Wants to know if she should still take Risperdal?

## 2014-10-07 NOTE — Telephone Encounter (Signed)
I returned patient's phone call.  She is concerned about swelling that she had and wondering if Risperdal may cause worsening of the swelling.  I suggested to try Risperdal and if she notices worsening of the swelling that she should call us back.  Discussed medication side effects and efficacy.

## 2014-11-09 ENCOUNTER — Encounter (HOSPITAL_COMMUNITY): Payer: Self-pay | Admitting: Psychiatry

## 2014-11-09 ENCOUNTER — Ambulatory Visit (INDEPENDENT_AMBULATORY_CARE_PROVIDER_SITE_OTHER): Payer: 59 | Admitting: Psychiatry

## 2014-11-09 VITALS — BP 118/80 | HR 87

## 2014-11-09 DIAGNOSIS — F319 Bipolar disorder, unspecified: Secondary | ICD-10-CM

## 2014-11-09 MED ORDER — BUSPIRONE HCL 10 MG PO TABS: 10.0000 mg | ORAL_TABLET | Freq: Two times a day (BID) | ORAL | Status: DC
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
## 2014-12-30 DIAGNOSIS — M542 Cervicalgia: Secondary | ICD-10-CM | POA: Diagnosis not present

## 2015-01-04 DIAGNOSIS — M810 Age-related osteoporosis without current pathological fracture: Secondary | ICD-10-CM | POA: Diagnosis not present

## 2015-01-05 DIAGNOSIS — F40218 Other animal type phobia: Secondary | ICD-10-CM | POA: Diagnosis not present

## 2015-01-07 ENCOUNTER — Ambulatory Visit (INDEPENDENT_AMBULATORY_CARE_PROVIDER_SITE_OTHER): Payer: 59 | Admitting: Psychiatry

## 2015-01-07 ENCOUNTER — Encounter (HOSPITAL_COMMUNITY): Payer: Self-pay | Admitting: Psychiatry

## 2015-01-07 VITALS — BP 101/67 | HR 77

## 2015-01-07 DIAGNOSIS — F319 Bipolar disorder, unspecified: Secondary | ICD-10-CM

## 2015-01-07 DIAGNOSIS — F909 Attention-deficit hyperactivity disorder, unspecified type: Secondary | ICD-10-CM

## 2015-01-07 MED ORDER — AMITRIPTYLINE HCL 50 MG PO TABS: 50.0000 mg | ORAL_TABLET | Freq: Every day | ORAL | Status: DC

## 2015-01-07 MED ORDER — BUSPIRONE HCL 10 MG PO TABS: 10.0000 mg | ORAL_TABLET | Freq: Two times a day (BID) | ORAL | Status: DC

## 2015-01-07 MED ORDER — LAMOTRIGINE 150 MG PO TABS: 150.0000 mg | ORAL_TABLET | Freq: Every day | ORAL | Status: DC
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
## 2015-01-26 ENCOUNTER — Encounter (HOSPITAL_COMMUNITY): Payer: Self-pay | Admitting: Emergency Medicine

## 2015-01-26 ENCOUNTER — Emergency Department (HOSPITAL_COMMUNITY)
Admission: EM | Admit: 2015-01-26 | Discharge: 2015-01-26 | Disposition: A | Discharge status: Home / Self Care | Payer: Medicare Other | Attending: Emergency Medicine | Admitting: Emergency Medicine

## 2015-01-26 DIAGNOSIS — Z87891 Personal history of nicotine dependence: Secondary | ICD-10-CM | POA: Insufficient documentation

## 2015-01-26 DIAGNOSIS — E780 Pure hypercholesterolemia: Secondary | ICD-10-CM | POA: Diagnosis not present

## 2015-01-26 DIAGNOSIS — Z23 Encounter for immunization: Secondary | ICD-10-CM | POA: Insufficient documentation

## 2015-01-26 DIAGNOSIS — Z8619 Personal history of other infectious and parasitic diseases: Secondary | ICD-10-CM | POA: Diagnosis not present

## 2015-01-26 DIAGNOSIS — Z7951 Long term (current) use of inhaled steroids: Secondary | ICD-10-CM | POA: Insufficient documentation

## 2015-01-26 DIAGNOSIS — Y9289 Other specified places as the place of occurrence of the external cause: Secondary | ICD-10-CM | POA: Diagnosis not present

## 2015-01-26 DIAGNOSIS — F329 Major depressive disorder, single episode, unspecified: Secondary | ICD-10-CM | POA: Insufficient documentation

## 2015-01-26 DIAGNOSIS — Z8669 Personal history of other diseases of the nervous system and sense organs: Secondary | ICD-10-CM | POA: Insufficient documentation

## 2015-01-26 DIAGNOSIS — Z8742 Personal history of other diseases of the female genital tract: Secondary | ICD-10-CM | POA: Insufficient documentation

## 2015-01-26 DIAGNOSIS — F419 Anxiety disorder, unspecified: Secondary | ICD-10-CM | POA: Insufficient documentation

## 2015-01-26 DIAGNOSIS — Z791 Long term (current) use of non-steroidal anti-inflammatories (NSAID): Secondary | ICD-10-CM | POA: Insufficient documentation

## 2015-01-26 DIAGNOSIS — S61215A Laceration without foreign body of left ring finger without damage to nail, initial encounter: Secondary | ICD-10-CM | POA: Insufficient documentation

## 2015-01-26 DIAGNOSIS — E119 Type 2 diabetes mellitus without complications: Secondary | ICD-10-CM | POA: Insufficient documentation

## 2015-01-26 DIAGNOSIS — Y9389 Activity, other specified: Secondary | ICD-10-CM | POA: Insufficient documentation

## 2015-01-26 DIAGNOSIS — K219 Gastro-esophageal reflux disease without esophagitis: Secondary | ICD-10-CM | POA: Diagnosis not present

## 2015-01-26 DIAGNOSIS — M1990 Unspecified osteoarthritis, unspecified site: Secondary | ICD-10-CM | POA: Diagnosis not present

## 2015-01-26 DIAGNOSIS — Y288XXA Contact with other sharp object, undetermined intent, initial encounter: Secondary | ICD-10-CM | POA: Insufficient documentation

## 2015-01-26 DIAGNOSIS — Z87448 Personal history of other diseases of urinary system: Secondary | ICD-10-CM | POA: Diagnosis not present

## 2015-01-26 DIAGNOSIS — Z79899 Other long term (current) drug therapy: Secondary | ICD-10-CM | POA: Insufficient documentation

## 2015-01-26 DIAGNOSIS — S61219A Laceration without foreign body of unspecified finger without damage to nail, initial encounter: Secondary | ICD-10-CM

## 2015-01-26 DIAGNOSIS — Y998 Other external cause status: Secondary | ICD-10-CM | POA: Insufficient documentation

## 2015-01-26 MED ORDER — TETANUS-DIPHTH-ACELL PERTUSSIS 5-2.5-18.5 LF-MCG/0.5 IM SUSP
0.5000 mL | Freq: Once | INTRAMUSCULAR | Status: AC
  Administered 2015-01-26: 0.5 mL via INTRAMUSCULAR
  Filled 2015-01-26: qty 0.5

## 2015-01-26 NOTE — ED Provider Notes (Signed)
CSN: [id number]     Arrival date & time 01/26/15  2102 History  This chart was scribed for non-physician practitioner Junius Creamer NP working with Blanchie Dessert, MD by Lora Havens, ED Scribe. This patient was seen in [facility] and the patient's care was started at 10:25 PM.   Chief Complaint  Patient presents with  . finger laceration    The history is provided by the patient.    HPI Comments: [name] is a 59 y.o. female who presents to the Emergency Department complaining of a new laceration to her left ring finger, acute onset tonight. Pt was cutting an avocado and accidentally cut her finger. She states her last tetanus was over 10 years ago. She has used an antibiotic ointment and closed her wound with an over the counter liquid bandage.   Past Medical History  Diagnosis Date  . Anxiety   . High cholesterol   . Cerebral palsy   . Borderline diabetes   . Melanoma in situ   . Dysplasia of cervix   . Neck mass   . IBS (irritable bowel syndrome)   . Acromegaly   . Headache(784.0)   . Bladder infection   . Colitis   . History of chicken pox   . History of mumps   . History of measles   . Yeast infection   . Bacterial infection   . Trichomonas   . Ovarian cyst   . Depression   . Arthritis   . GERD (gastroesophageal reflux disease)   . Kidney infection   . Benign paroxysmal positional vertigo 02/24/2013  . Diabetes mellitus, type II    Past Surgical History  Procedure Laterality Date  . Leg tendon surgery    . Knee rotation    . Rotator cuff surgery    . Appendectomy    . Carpal tunnel release    . Ovarian cyst removal    . Replacement total knee bilateral  2006   Family History  Problem Relation Age of Onset  . Suicidality Father   . Depression Father   . Alcohol abuse Father   . Drug abuse Brother   . Alcohol abuse Brother   . Esophageal cancer Brother   . Depression Maternal Aunt   . Alcohol abuse Brother   . Anxiety disorder Daughter   .  Depression Daughter   . Suicidality Daughter   . Alcohol abuse Daughter   . Drug abuse Son   . Alcohol abuse Son   . Emphysema Sister     smoked  . Emphysema Father     smoked   History  Substance Use Topics  . Smoking status: Former Smoker -- 1.00 packs/day for 31 years    Types: Cigarettes    Quit date: 11/18/1999  . Smokeless tobacco: Never Used  . Alcohol Use: No   OB History    Gravida Para Term Preterm AB TAB SAB Ectopic Multiple Living   3 2             Review of Systems  Musculoskeletal: Negative for joint swelling.  Skin: Positive for wound.  All other systems reviewed and are negative.   Allergies  Miconazole nitrate and Sulfonamide derivatives  Home Medications   Prior to Admission medications   Medication Sig Start Date End Date Taking? Authorizing Provider  albuterol (PROAIR HFA) 108 (90 BASE) MCG/ACT inhaler Inhale 2 puffs into the lungs every 6 (six) hours as needed for wheezing or shortness of breath (wheezing).  Yes Historical Provider, MD  alendronate (FOSAMAX) 70 MG tablet Take 70 mg by mouth once a week.  06/13/14  Yes Historical Provider, MD  amitriptyline (ELAVIL) 50 MG tablet Take 1 tablet (50 mg total) by mouth at bedtime. 01/07/15  Yes [name], MD  busPIRone (BUSPAR) 10 MG tablet Take 1 tablet (10 mg total) by mouth 2 (two) times daily. 01/07/15 01/07/16 Yes [name], MD  Calcium Carbonate-Vitamin D (CALCIUM 600 + D PO) Take 1 tablet by mouth daily.   Yes Historical Provider, MD  Cholecalciferol (VITAMIN D) 2000 UNITS CAPS Take 1 capsule by mouth daily.   Yes Historical Provider, MD  Chromium Picolinate 200 MCG CAPS Take 250 capsules by mouth 2 (two) times daily.    Yes Historical Provider, MD  cyanocobalamin 100 MCG tablet Take 100 mcg by mouth daily.   Yes Historical Provider, MD  fluticasone (FLONASE) 50 MCG/ACT nasal spray Place 1 spray into the nose daily.   Yes Historical Provider, MD  FOLIC ACID PO Take 989 mg by mouth daily.   Yes  Historical Provider, MD  gabapentin (NEURONTIN) 100 MG capsule Take 100 mg by mouth 2 (two) times daily. 12/30/14  Yes Historical Provider, MD  GARCINIA CAMBOGIA-CHROMIUM PO Take 100 mg by mouth daily.   Yes Historical Provider, MD  Horse Chestnut 300 MG CAPS Take 300 mg by mouth daily.   Yes Historical Provider, MD  Hyaluronic Acid-Vitamin C (HYALURONIC ACID PO) Take 100 mg by mouth daily.   Yes Historical Provider, MD  Lactobacillus (ACIDOPHILUS PO) Take 1 capsule by mouth at bedtime.    Yes Historical Provider, MD  lamoTRIgine (LAMICTAL) 150 MG tablet Take 1 tablet (150 mg total) by mouth daily. 01/07/15  Yes [name], MD  Magnesium Oxide 250 MG TABS Take 1 tablet by mouth daily.   Yes Historical Provider, MD  Melatonin 3 MG CAPS Take 3 mg by mouth at bedtime.    Yes Historical Provider, MD  meloxicam (MOBIC) 7.5 MG tablet Take 7.5 mg by mouth daily.  12/30/14  Yes Historical Provider, MD  niacin 100 MG tablet Take 200 mg by mouth at bedtime.   Yes Historical Provider, MD  oxybutynin (DITROPAN) 5 MG tablet Take 5 mg by mouth 2 (two) times daily.  07/02/14  Yes Historical Provider, MD  pravastatin (PRAVACHOL) 40 MG tablet Take 40 mg by mouth at bedtime.  09/30/14  Yes Historical Provider, MD  Darlina Rumpf POWD Take 1 capsule by mouth daily.    Yes Historical Provider, MD  spironolactone (ALDACTONE) 50 MG tablet Take 50 mg by mouth daily.  09/29/14  Yes Historical Provider, MD  tiZANidine (ZANAFLEX) 4 MG tablet Take 4 mg by mouth at bedtime.  06/30/14  Yes Historical Provider, MD  traMADol (ULTRAM) 50 MG tablet Take 50 mg by mouth at bedtime.  12/10/14  Yes Historical Provider, MD  Tyrosine 500 MG CAPS Take 500 mg by mouth at bedtime.    Yes Historical Provider, MD   BP 135/76 mmHg  Pulse 100  Temp(Src) 98.2 F (36.8 C) (Oral)  Resp 16  SpO2 95% Physical Exam  Constitutional: She appears well-developed and well-nourished.  HENT:  Head: Normocephalic.  Eyes: Pupils are equal, round, and reactive  to light.  Neck: Normal range of motion.  Cardiovascular: Normal rate.   Pulmonary/Chest: Effort normal.  Musculoskeletal: She exhibits no edema or tenderness.  Neurological: She is alert.  Skin:  Three-quarter centimeter laceration to the palmar surface of the  left ring finger well approximated and sealed with liquid bandage by patient  Nursing note and vitals reviewed.   ED Course  Procedures  DIAGNOSTIC STUDIES: Oxygen Saturation is 95% on room air, normal by my interpretation.    COORDINATION OF CARE: 10:27 PM Discussed treatment plan with pt at bedside and pt agreed to plan.  Labs Review Labs Reviewed - No data to display  Imaging Review No results found.   EKG Interpretation None     Patient denies abdominal approximating her laceration and feeling this with liquid bandage no further intervention will be required at this time by [place] to the laceration we have updated her tetanus and discharge patient had MDM   Final diagnoses:  Laceration of finger, initial encounter         Junius Creamer, NP 01/26/15 [place], MD 01/27/15 1501

## 2015-01-26 NOTE — ED Notes (Signed)
Pt states she was cutting up an avocado tonight and cut her left ring finger

## 2015-01-26 NOTE — Discharge Instructions (Signed)
Your tetanus has been updated you due to good job cleaning her wound and applying liquid bandage no further intervention needs to be done at this time until the liquid bandage peels off on it's own

## 2015-01-31 DIAGNOSIS — M542 Cervicalgia: Secondary | ICD-10-CM | POA: Diagnosis not present

## 2015-02-01 ENCOUNTER — Other Ambulatory Visit: Payer: Self-pay | Admitting: Orthopaedic Surgery

## 2015-02-01 DIAGNOSIS — M542 Cervicalgia: Secondary | ICD-10-CM

## 2015-02-02 DIAGNOSIS — E782 Mixed hyperlipidemia: Secondary | ICD-10-CM | POA: Diagnosis not present

## 2015-02-02 DIAGNOSIS — R609 Edema, unspecified: Secondary | ICD-10-CM | POA: Diagnosis not present

## 2015-02-02 DIAGNOSIS — E119 Type 2 diabetes mellitus without complications: Secondary | ICD-10-CM | POA: Diagnosis not present

## 2015-02-02 DIAGNOSIS — Z79899 Other long term (current) drug therapy: Secondary | ICD-10-CM | POA: Diagnosis not present

## 2015-02-02 DIAGNOSIS — R42 Dizziness and giddiness: Secondary | ICD-10-CM | POA: Diagnosis not present

## 2015-02-10 ENCOUNTER — Ambulatory Visit
Admission: RE | Admit: 2015-02-10 | Discharge: 2015-02-10 | Disposition: A | Discharge status: Home / Self Care | Payer: Medicare Other | Source: Ambulatory Visit | Attending: Orthopaedic Surgery | Admitting: Orthopaedic Surgery

## 2015-02-10 DIAGNOSIS — M4802 Spinal stenosis, cervical region: Secondary | ICD-10-CM | POA: Diagnosis not present

## 2015-02-10 DIAGNOSIS — M542 Cervicalgia: Secondary | ICD-10-CM

## 2015-02-10 DIAGNOSIS — M2578 Osteophyte, vertebrae: Secondary | ICD-10-CM | POA: Diagnosis not present

## 2015-02-10 DIAGNOSIS — M4312 Spondylolisthesis, cervical region: Secondary | ICD-10-CM | POA: Diagnosis not present

## 2015-02-15 DIAGNOSIS — M542 Cervicalgia: Secondary | ICD-10-CM | POA: Diagnosis not present

## 2015-02-18 ENCOUNTER — Ambulatory Visit (INDEPENDENT_AMBULATORY_CARE_PROVIDER_SITE_OTHER): Payer: Medicare Other | Admitting: Neurology

## 2015-02-18 ENCOUNTER — Encounter: Payer: Self-pay | Admitting: Neurology

## 2015-02-18 VITALS — BP 109/72 | HR 83 | Resp 16

## 2015-02-18 DIAGNOSIS — R600 Localized edema: Secondary | ICD-10-CM

## 2015-02-18 DIAGNOSIS — G8929 Other chronic pain: Secondary | ICD-10-CM

## 2015-02-18 DIAGNOSIS — M542 Cervicalgia: Secondary | ICD-10-CM

## 2015-02-18 DIAGNOSIS — R51 Headache: Secondary | ICD-10-CM

## 2015-02-18 DIAGNOSIS — R519 Headache, unspecified: Secondary | ICD-10-CM

## 2015-02-18 NOTE — Progress Notes (Signed)
Subjective:    Patient ID: [name] is a 59 y.o. female.  HPI     Interim history:   [name] is a 59 year old right-handed woman with underlying medical history of acromegaly, hyperlipidemia, cerebral palsy, bipolar d/o, emphysema, eczema, reflux disease, rosacea, irritable bowel syndrome, allergic rhinitis, obesity, and peripheral neuropathy, who presents for follow-up consultation of her recurrent headaches, but would like to discuss a new problem, namely neck pain. The patient is unaccompanied today. I last saw her on 08/06/2014, at which time she reported recurrent headaches, described as a squeezing sensation lasting for only seconds at a time. She also reported sleep related symptoms including having nightmares, snoring, not sleeping very well and I suggested we proceed with a sleep study as well as repeat brain MRI with and without contrast. She had a brain MRI with and without contrast on 09/06/2014: Slightly abnormal MRI scan of the brain showing tiny nonspecific periventricular and subcortical white matter hyperintensities which appear to be unchanged compared with previous MRI scan dated 03/10/2013.   In addition, personally reviewed the images through the PACS system and we called her with her test results. She did not have her sleep study done.   Today, 02/18/2015: she reports another problem. She had a recent cervical spine MRI without contrast on 02/10/2015 which I reviewed: Multilevel foraminal stenoses on the left, moderate central canal stenosis at C5-6 with slight retrolisthesis and broad-based disc osteophyte complex effacing the ventral CSF, moderate foraminal narrowing bilaterally at C5-6, moderate left foraminal narrowing at C 6-7. In addition, personally reviewed the images through the PACS system. She has significant reversal of cervical lordosis and multilevel disc disease, most prominent at C5-6. Spinal cord seems fine.   Previously:  I first met her in April  2014 for a complaint of dizziness and vertigo. She had a brain MRI with and without contrast as well as a brain MRA without contrast on 03/10/2013: Equivocal MRI brain (with and without) demonstrating few minimal, punctate foci of non-specific gliosis. No acute findings. The brain MRA was normal. We called her with the test results at the time.  She now presents with recurrent headaches which she describes as a squeezing sensation that lasts only for seconds. This can be on either side of the head. She does not have any associated nausea, vomiting, photophobia or sonophobia. She does not describe any new neurological focal issues. Headaches have been ongoing for perhaps years and she worries about a brain tumor. Of note, she was diagnosed with acromegaly years ago but never was proven to have a pituitary adenoma. She has also been experiencing recurrent dreams, nightmares, and nocturnal hallucinations which she is wondering are related to the headaches were a brain tumor. She describes some of the dreams to me and also brings a written account. They are disturbing and scary to her. They seem very real. You did not think they were side effects of any of her medications. Of note, she endorses snoring and not sleeping well. She does not necessarily wake up with a headache. She has never had a sleep study.   Her Past Medical History Is Significant For: Past Medical History  Diagnosis Date  . Anxiety   . High cholesterol   . Cerebral palsy   . Borderline diabetes   . Melanoma in situ   . Dysplasia of cervix   . Neck mass   . IBS (irritable bowel syndrome)   . Acromegaly   . Headache(784.0)   . Bladder  infection   . Colitis   . History of chicken pox   . History of mumps   . History of measles   . Yeast infection   . Bacterial infection   . Trichomonas   . Ovarian cyst   . Depression   . Arthritis   . GERD (gastroesophageal reflux disease)   . Kidney infection   . Benign paroxysmal  positional vertigo 02/24/2013  . Diabetes mellitus, type II     Her Past Surgical History Is Significant For: Past Surgical History  Procedure Laterality Date  . Leg tendon surgery    . Knee rotation    . Rotator cuff surgery    . Appendectomy    . Carpal tunnel release    . Ovarian cyst removal    . Replacement total knee bilateral  2006    Her Family History Is Significant For: Family History  Problem Relation Age of Onset  . Suicidality Father   . Depression Father   . Alcohol abuse Father   . Drug abuse Brother   . Alcohol abuse Brother   . Esophageal cancer Brother   . Depression Maternal Aunt   . Alcohol abuse Brother   . Anxiety disorder Daughter   . Depression Daughter   . Suicidality Daughter   . Alcohol abuse Daughter   . Drug abuse Son   . Alcohol abuse Son   . Emphysema Sister     smoked  . Emphysema Father     smoked    Her Social History Is Significant For: History   Social History  . Marital Status: Divorced    Spouse Name: N/A  . Number of Children: N/A  . Years of Education: graduate   Occupational History  . Disabled    Social History Main Topics  . Smoking status: Former Smoker -- 1.00 packs/day for 31 years    Types: Cigarettes    Quit date: 11/18/1999  . Smokeless tobacco: Never Used  . Alcohol Use: No  . Drug Use: No  . Sexual Activity: No   Other Topics Concern  . None   Social History Narrative   Right handed, Disabled. Live boyfriend , [name], Caffeine 3-4 cups.     Her Allergies Are:  Allergies  Allergen Reactions  . Miconazole Nitrate Itching  . Sulfonamide Derivatives Rash  :   Her Current Medications Are:  Outpatient Encounter Prescriptions as of 02/18/2015  Medication Sig  . African Mango-Green Tea 1200-200 MG CAPS Take by mouth.  [name] alendronate (FOSAMAX) 70 MG tablet Take 70 mg by mouth once a week.   [name] Apple Cider Vinegar 600 MG CAPS Take by mouth.  . Ascorbic Acid (VITAMIN C) 500 MG CAPS Take by  mouth.  [name] atorvastatin (LIPITOR) 40 MG tablet Take 40 mg by mouth daily.  [name] BIOTIN 5000 PO Take by mouth.  . Calcium Carbonate-Vitamin D (CALCIUM 600 + D PO) Take 1 tablet by mouth daily.  . Chromium Picolinate 200 MCG CAPS Take 250 capsules by mouth 2 (two) times daily.   . cyanocobalamin 100 MCG tablet Take 100 mcg by mouth daily.  [name] FOLIC ACID PO Take 876 mg by mouth daily.  [name] gabapentin (NEURONTIN) 100 MG capsule Take 100 mg by mouth 2 (two) times daily.  [name] GARCINIA CAMBOGIA-CHROMIUM PO Take 100 mg by mouth daily.  . Horse Chestnut 300 MG CAPS Take 300 mg by mouth daily.  [name] Hyaluronic Acid-Vitamin C (HYALURONIC ACID PO) Take 100 mg by mouth  daily.  . lamoTRIgine (LAMICTAL) 150 MG tablet Take 1 tablet (150 mg total) by mouth daily.  . Magnesium Oxide 250 MG TABS Take 1 tablet by mouth daily.  . meloxicam (MOBIC) 7.5 MG tablet Take 7.5 mg by mouth daily.   . niacin 100 MG tablet Take 200 mg by mouth at bedtime.  [name] oxybutynin (DITROPAN) 5 MG tablet Take 5 mg by mouth 2 (two) times daily.   . Probiotic Product (PROBIOTIC DAILY PO) Take by mouth.  Lia Hopping Leaf POWD Take 1 capsule by mouth daily.   [name] spironolactone (ALDACTONE) 100 MG tablet   . tiZANidine (ZANAFLEX) 4 MG tablet Take 4 mg by mouth at bedtime.   . triamterene (DYRENIUM) 50 MG capsule Take 50 mg by mouth 2 (two) times daily.  . Vinpocetine POWD by Does not apply route.  . [DISCONTINUED] albuterol (PROAIR HFA) 108 (90 BASE) MCG/ACT inhaler Inhale 2 puffs into the lungs every 6 (six) hours as needed for wheezing or shortness of breath (wheezing).   . [DISCONTINUED] amitriptyline (ELAVIL) 50 MG tablet Take 1 tablet (50 mg total) by mouth at bedtime.  . [DISCONTINUED] busPIRone (BUSPAR) 10 MG tablet Take 1 tablet (10 mg total) by mouth 2 (two) times daily.  . [DISCONTINUED] Cholecalciferol (VITAMIN D) 2000 UNITS CAPS Take 1 capsule by mouth daily.  . [DISCONTINUED] fluticasone (FLONASE) 50 MCG/ACT nasal spray Place 1 spray into the  nose daily.  . [DISCONTINUED] Lactobacillus (ACIDOPHILUS PO) Take 1 capsule by mouth at bedtime.   . [DISCONTINUED] Melatonin 3 MG CAPS Take 3 mg by mouth at bedtime.   . [DISCONTINUED] pravastatin (PRAVACHOL) 40 MG tablet Take 40 mg by mouth at bedtime.   . [DISCONTINUED] spironolactone (ALDACTONE) 50 MG tablet Take 50 mg by mouth daily.   . [DISCONTINUED] traMADol (ULTRAM) 50 MG tablet Take 50 mg by mouth at bedtime.   . [DISCONTINUED] Tyrosine 500 MG CAPS Take 500 mg by mouth at bedtime.   :  Review of Systems:  Out of a complete 14 point review of systems, all are reviewed and negative with the exception of these symptoms as listed below:   Review of Systems  HENT: Positive for tinnitus.   Gastrointestinal: Positive for abdominal pain.  Endocrine: Positive for polydipsia.       Excessive eating   Musculoskeletal:       Joint pain and swelling, Aching muscles, Muscle cramps, Neck pain, Neck stiffness  Allergic/Immunologic: Positive for environmental allergies.  Neurological: Positive for dizziness, weakness and headaches.       Hallucinations durning sleep   Psychiatric/Behavioral: Positive for hallucinations.    Objective:  Neurologic Exam  Physical Exam Physical Examination:   Filed Vitals:   02/18/15 1122  BP: 109/72  Pulse: 83  Resp: 16    General Examination: The patient is a 59 y.o. female in no acute distress. She appears well-developed and well-nourished and adequately groomed. She is obese. She situated in her motorized scooter.  HEENT: Normocephalic, atraumatic, pupils are equal, round and reactive to light and accommodation. Extraocular tracking is good without limitation to gaze excursion or nystagmus noted. She has no evidence of nystagmus. Normal smooth pursuit is noted. Hearing is grossly intact. She wears corrective glasses. She does endorse not having had her eyes checked in years. Funduscopic exam is normal. Face is symmetric with normal facial animation  and normal facial sensation. Speech is clear with no dysarthria noted. She speaks softly. There is no lip, neck or jaw tremor. Neck is  supple with full range of motion. There are no carotid bruits on auscultation. Oropharynx exam reveals: Moderate to severe mouth dryness, adequate dental hygiene and moderate airway crowding, due to redundant soft palate and large uvula. Mallampati is class II. Tongue protrudes centrally and palate elevates symmetrically. She has no overbite.   Chest: Clear to auscultation without wheezing, rhonchi or crackles noted.  Heart: S1+S2+0, regular and normal without murmurs, rubs or gallops noted.   Abdomen: Soft, non-tender and non-distended with normal bowel sounds appreciated on auscultation.  Extremities: There is 2+ pitting edema in the distal lower extremities bilaterally, up to the midcalf area, unchanged.   Skin: Warm and dry without trophic changes noted.   Musculoskeletal: exam reveals no obvious joint deformities, tenderness or joint swelling or erythema, but she has tenderness in the posterior neck muscles and mild tightness in those neck muscles as well as the upper shoulder areas bilaterally.   Neurologically:  Mental status: The patient is awake, alert and oriented in all 4 spheres. Her memory, attention, language and knowledge are appropriate. There is no aphasia, agnosia, apraxia or anomia. Speech is clear with normal prosody and enunciation. Thought process is linear. Mood is congruent and affect is flat.  Cranial nerves are as described above under HEENT exam. In addition, shoulder shrug is normal with equal shoulder height noted.  Motor exam: Normal bulk, strength and tone is noted in the upper extremities. In the lower extremities she has 4+ out of 5 strength in the left lower extremity and 4 minus out of 5 strength in the right lower extremity. She has hyperreflexia in the knees bilaterally but absent ankle jerks, unchanged. There is no drift, tremor  or rebound. She has upgoing toes bilaterally. Reflexes are 2+ in the upper extremities. Fine motor skills are intact with normal finger taps, normal hand movements, normal rapid alternating patting, but foot tapping and foot agility is difficult primarily due to weakness and swelling.  Cerebellar testing shows no dysmetria or intention tremor on finger to nose testing. There is no truncal ataxia. She cannot do heel to shin. I did not have her stand or walk for me as she did not bring her walker and she primarily uses her scooter. She does endorse being able to transfer to her bed at night. Sensory exam is intact to light touch, pinprick, vibration, temperature sense in the upper extremities.   Assessment and Plan:   In summary, [name] is a 59 year old female with a history of cerebral palsy, acromegaly, lower extremity edema, and multiple other medical problems who presents for follow-up consultation of her recurrent headaches. She feels unchanged. Recent MRI brain showed stable findings from a urinary half prior. She was advised of the test results. She also has been having more neck pain. She has seen orthopedics and is encouraged to pursue treatment of her multilevel degenerative neck disease through orthopedics. She may benefit from injection treatment to her neck as she may also find that her headaches improve after her neck pain improves. She may have a component of cervicogenic headaches. She is already on multiple medications and this includes gabapentin, amitriptyline, muscle relaxer, and anti-inflammatory medications. I would not suggest any additional medications at this time. She does not wish to pursue a sleep study. I advised her that if she were to have underlying obstructive sleep apnea and pursue treatment for this in the form of CPAP treatment, she may find that her headaches also improved. She is at this point  not ready for this and is encouraged to call back if she is ready to  pursue a sleep study. At this juncture, I will see her back as needed. I answered all her questions today and she was in agreement. She had some specific questions regarding epidural steroid injections or trigger point injections and I encouraged her to bring this up with her orthopedic doctor. I'm not familiar enough with these injections myself. For her lower extremity swelling she is advised to follow-up with her primary care physician.

## 2015-02-18 NOTE — Patient Instructions (Signed)
Your headaches may improve with improvement in your neck pain.  I would encourage you to follow up with orthopedics.  Please see an eye doctor for an eye check up.  If you want to consider a sleep study, please call us back.  I will see you back as needed.

## 2015-02-23 ENCOUNTER — Other Ambulatory Visit (HOSPITAL_COMMUNITY): Payer: Self-pay | Admitting: Psychiatry

## 2015-02-23 DIAGNOSIS — F319 Bipolar disorder, unspecified: Secondary | ICD-10-CM

## 2015-02-24 NOTE — Telephone Encounter (Signed)
Please call and continue buspar if still taking. There is no documentation why it was stopped.

## 2015-02-24 NOTE — Telephone Encounter (Signed)
Dr. Rexene Alberts discontinued patient's Buspar when she was evaluated by Neurology on 02/18/15.

## 2015-02-24 NOTE — Telephone Encounter (Signed)
Telephone call with patient to verify she was still taking Buspar daily as it was discontinued by Dr. Rexene Alberts, her neurologist on 02/18/15.  Patient stated he did not mention any plans to take her off Buspar and she was still taking it daily.  E-scribed patient's new 90 day order in to her [place] in [facility] and instructed patient this order would be sent today per authorization of Dr. Adele Schilder. Patient to call back if any problems prior to next appointment on 04/11/15.

## 2015-02-27 DIAGNOSIS — M4802 Spinal stenosis, cervical region: Secondary | ICD-10-CM

## 2015-02-27 HISTORY — DX: Spinal stenosis, cervical region: M48.02

## 2015-03-08 DIAGNOSIS — M4802 Spinal stenosis, cervical region: Secondary | ICD-10-CM | POA: Diagnosis not present

## 2015-03-08 DIAGNOSIS — M5412 Radiculopathy, cervical region: Secondary | ICD-10-CM | POA: Diagnosis not present

## 2015-03-11 DIAGNOSIS — M5412 Radiculopathy, cervical region: Secondary | ICD-10-CM | POA: Diagnosis not present

## 2015-03-11 DIAGNOSIS — M542 Cervicalgia: Secondary | ICD-10-CM | POA: Diagnosis not present

## 2015-03-11 DIAGNOSIS — M4802 Spinal stenosis, cervical region: Secondary | ICD-10-CM | POA: Diagnosis not present

## 2015-04-11 ENCOUNTER — Ambulatory Visit (INDEPENDENT_AMBULATORY_CARE_PROVIDER_SITE_OTHER): Payer: 59 | Admitting: Psychiatry

## 2015-04-11 ENCOUNTER — Encounter (HOSPITAL_COMMUNITY): Payer: Self-pay | Admitting: Psychiatry

## 2015-04-11 VITALS — BP 124/84 | HR 97 | Ht 67.0 in | Wt 214.0 lb

## 2015-04-11 DIAGNOSIS — F909 Attention-deficit hyperactivity disorder, unspecified type: Secondary | ICD-10-CM | POA: Diagnosis not present

## 2015-04-11 DIAGNOSIS — F319 Bipolar disorder, unspecified: Secondary | ICD-10-CM | POA: Diagnosis not present

## 2015-04-11 DIAGNOSIS — F419 Anxiety disorder, unspecified: Secondary | ICD-10-CM

## 2015-04-11 MED ORDER — AMITRIPTYLINE HCL 50 MG PO TABS
50.0000 mg | ORAL_TABLET | Freq: Every day | ORAL | Status: DC
Start: 2015-04-11 — End: 2015-07-14

## 2015-04-11 MED ORDER — BUSPIRONE HCL 10 MG PO TABS: 10.0000 mg | ORAL_TABLET | Freq: Two times a day (BID) | ORAL | Status: DC

## 2015-04-11 MED ORDER — LAMOTRIGINE 150 MG PO TABS: 150.0000 mg | ORAL_TABLET | Freq: Every day | ORAL | Status: DC
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
## 2015-04-25 DIAGNOSIS — E669 Obesity, unspecified: Secondary | ICD-10-CM | POA: Diagnosis not present

## 2015-04-25 DIAGNOSIS — Z6834 Body mass index (BMI) 34.0-34.9, adult: Secondary | ICD-10-CM | POA: Diagnosis not present

## 2015-04-25 DIAGNOSIS — R635 Abnormal weight gain: Secondary | ICD-10-CM | POA: Diagnosis not present

## 2015-05-03 ENCOUNTER — Ambulatory Visit: Payer: Medicare Other | Admitting: Certified Nurse Midwife

## 2015-06-09 DIAGNOSIS — R609 Edema, unspecified: Secondary | ICD-10-CM | POA: Diagnosis not present

## 2015-06-09 DIAGNOSIS — M8580 Other specified disorders of bone density and structure, unspecified site: Secondary | ICD-10-CM | POA: Diagnosis not present

## 2015-06-09 DIAGNOSIS — E119 Type 2 diabetes mellitus without complications: Secondary | ICD-10-CM | POA: Diagnosis not present

## 2015-06-09 DIAGNOSIS — Z23 Encounter for immunization: Secondary | ICD-10-CM | POA: Diagnosis not present

## 2015-06-09 DIAGNOSIS — E782 Mixed hyperlipidemia: Secondary | ICD-10-CM | POA: Diagnosis not present

## 2015-06-13 DIAGNOSIS — M4802 Spinal stenosis, cervical region: Secondary | ICD-10-CM | POA: Diagnosis not present

## 2015-06-13 DIAGNOSIS — M5412 Radiculopathy, cervical region: Secondary | ICD-10-CM | POA: Diagnosis not present

## 2015-06-13 DIAGNOSIS — M542 Cervicalgia: Secondary | ICD-10-CM | POA: Diagnosis not present

## 2015-06-24 DIAGNOSIS — M5412 Radiculopathy, cervical region: Secondary | ICD-10-CM | POA: Diagnosis not present

## 2015-06-24 DIAGNOSIS — M4802 Spinal stenosis, cervical region: Secondary | ICD-10-CM | POA: Diagnosis not present

## 2015-07-13 ENCOUNTER — Telehealth: Payer: Self-pay | Admitting: *Deleted

## 2015-07-13 ENCOUNTER — Encounter: Payer: Self-pay | Admitting: Obstetrics & Gynecology

## 2015-07-13 ENCOUNTER — Ambulatory Visit (HOSPITAL_COMMUNITY): Payer: Self-pay | Admitting: Psychiatry

## 2015-07-13 DIAGNOSIS — N938 Other specified abnormal uterine and vaginal bleeding: Secondary | ICD-10-CM

## 2015-07-13 NOTE — Telephone Encounter (Signed)
Contacted patient, informed of ultrasound appointment and special instructions.  Pt verbalizes understanding and has no further questions.

## 2015-07-14 ENCOUNTER — Encounter (HOSPITAL_COMMUNITY): Payer: Self-pay | Admitting: Psychiatry

## 2015-07-14 ENCOUNTER — Ambulatory Visit (INDEPENDENT_AMBULATORY_CARE_PROVIDER_SITE_OTHER): Payer: 59 | Admitting: Psychiatry

## 2015-07-14 VITALS — BP 124/84 | HR 98

## 2015-07-14 DIAGNOSIS — F909 Attention-deficit hyperactivity disorder, unspecified type: Secondary | ICD-10-CM | POA: Diagnosis not present

## 2015-07-14 DIAGNOSIS — F419 Anxiety disorder, unspecified: Secondary | ICD-10-CM | POA: Diagnosis not present

## 2015-07-14 DIAGNOSIS — F319 Bipolar disorder, unspecified: Secondary | ICD-10-CM | POA: Diagnosis not present

## 2015-07-14 MED ORDER — AMITRIPTYLINE HCL 50 MG PO TABS: 50.0000 mg | ORAL_TABLET | Freq: Every day | ORAL | Status: DC

## 2015-07-14 MED ORDER — BUSPIRONE HCL 10 MG PO TABS: 10.0000 mg | ORAL_TABLET | Freq: Two times a day (BID) | ORAL | Status: DC

## 2015-07-14 MED ORDER — LAMOTRIGINE 150 MG PO TABS: 150.0000 mg | ORAL_TABLET | Freq: Every day | ORAL | Status: DC
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
## 2015-07-26 ENCOUNTER — Ambulatory Visit (HOSPITAL_COMMUNITY)
Admission: RE | Admit: 2015-07-26 | Discharge: 2015-07-26 | Disposition: A | Discharge status: Home / Self Care | Payer: Medicare Other | Source: Ambulatory Visit | Attending: Obstetrics & Gynecology | Admitting: Obstetrics & Gynecology

## 2015-07-26 DIAGNOSIS — E669 Obesity, unspecified: Secondary | ICD-10-CM | POA: Insufficient documentation

## 2015-07-26 DIAGNOSIS — E220 Acromegaly and pituitary gigantism: Secondary | ICD-10-CM | POA: Diagnosis not present

## 2015-07-26 DIAGNOSIS — N938 Other specified abnormal uterine and vaginal bleeding: Secondary | ICD-10-CM | POA: Diagnosis not present

## 2015-07-26 DIAGNOSIS — Z780 Asymptomatic menopausal state: Secondary | ICD-10-CM | POA: Diagnosis not present

## 2015-07-26 DIAGNOSIS — N8320 Unspecified ovarian cysts: Secondary | ICD-10-CM | POA: Insufficient documentation

## 2015-08-04 ENCOUNTER — Other Ambulatory Visit (HOSPITAL_COMMUNITY)
Admission: RE | Admit: 2015-08-04 | Discharge: 2015-08-04 | Disposition: A | Discharge status: Home / Self Care | Payer: Medicare Other | Source: Ambulatory Visit | Attending: Obstetrics & Gynecology | Admitting: Obstetrics & Gynecology

## 2015-08-04 ENCOUNTER — Encounter: Payer: Self-pay | Admitting: Obstetrics & Gynecology

## 2015-08-04 ENCOUNTER — Ambulatory Visit (INDEPENDENT_AMBULATORY_CARE_PROVIDER_SITE_OTHER): Payer: Medicare Other | Admitting: Obstetrics & Gynecology

## 2015-08-04 VITALS — BP 91/52 | HR 92 | Temp 98.0°F

## 2015-08-04 DIAGNOSIS — Z01411 Encounter for gynecological examination (general) (routine) with abnormal findings: Secondary | ICD-10-CM | POA: Diagnosis not present

## 2015-08-04 DIAGNOSIS — M479 Spondylosis, unspecified: Secondary | ICD-10-CM | POA: Diagnosis not present

## 2015-08-04 DIAGNOSIS — Z1151 Encounter for screening for human papillomavirus (HPV): Secondary | ICD-10-CM | POA: Diagnosis not present

## 2015-08-04 DIAGNOSIS — Z124 Encounter for screening for malignant neoplasm of cervix: Secondary | ICD-10-CM

## 2015-08-04 DIAGNOSIS — N950 Postmenopausal bleeding: Secondary | ICD-10-CM | POA: Insufficient documentation

## 2015-08-04 DIAGNOSIS — E2839 Other primary ovarian failure: Secondary | ICD-10-CM

## 2015-08-04 DIAGNOSIS — G809 Cerebral palsy, unspecified: Secondary | ICD-10-CM

## 2015-08-04 DIAGNOSIS — Z1231 Encounter for screening mammogram for malignant neoplasm of breast: Secondary | ICD-10-CM

## 2015-08-04 NOTE — Progress Notes (Signed)
CLINIC ENCOUNTER NOTE  History:  59 y.o. G3P2 here today for evaluation of postmenopausal bleeding (PMB).  She underwent menopause many years ago then had some small amount of bleeding and spotting over the past two months.  Patient has cerebral palsy and limited mobility, encounter today is done on [facility] (3rd floor in the hospital) in a room with lift-assist. She denies any current abnormal vaginal discharge, bleeding, pelvic pain or other concerns.   Past Medical History  Diagnosis Date  . Anxiety   . High cholesterol   . Cerebral palsy ([place])   . Melanoma in situ ([place])   . Dysplasia of cervix   . Neck mass   . IBS (irritable bowel syndrome)   . Acromegaly ([place])   . Headache(784.0)   . Bladder infection   . Colitis   . History of chicken pox   . History of mumps   . History of measles   . Yeast infection   . Bacterial infection   . Trichomonas   . Ovarian cyst   . Depression   . Arthritis   . GERD (gastroesophageal reflux disease)   . Kidney infection   . Benign paroxysmal positional vertigo 02/24/2013  . Diabetes mellitus, type II ([place])   . Stenosis, cervical spine 05/16    Past Surgical History  Procedure Laterality Date  . Leg tendon surgery    . Knee rotation    . Rotator cuff surgery    . Appendectomy    . Carpal tunnel release    . Ovarian cyst removal    . Replacement total knee bilateral  2006    The following portions of the patient's history were reviewed and updated as appropriate: allergies, current medications, past family history, past medical history, past social history, past surgical history and problem list.   Health Maintenance:  Normal pap on 04/02/2012.    Review of Systems:  Pertinent items noted in HPI and remainder of comprehensive ROS otherwise negative.  Objective:  Physical Exam BP 91/52 mmHg  Pulse 92  Temp(Src) 98 F (36.7 C) (Oral) CONSTITUTIONAL: Well-developed, well-nourished female in no acute distress.  HENT:   Normocephalic, atraumatic. External right and left ear normal. Oropharynx is clear and moist EYES: Conjunctivae and EOM are normal. Pupils are equal, round, and reactive to light. No scleral icterus.  NECK: Normal range of motion, supple, no masses SKIN: Skin is warm and dry. No rash noted. Not diaphoretic. No erythema. No pallor. [place]: Alert and oriented to person, place, and time. Normal reflexes, muscle tone coordination. No cranial nerve deficit noted. PSYCHIATRIC: Normal mood and affect. Normal behavior. Normal judgment and thought content. CARDIOVASCULAR: Normal heart rate noted RESPIRATORY: Effort and breath sounds normal, no problems with respiration noted ABDOMEN: Soft, no distention noted.   PELVIC: Atrophic external genitalia; atrophic vaginal mucosa and cervix.  No abnormal discharge noted. Pap smear obtained. Normal uterine size, no other palpable masses, no uterine or adnexal tenderness. MUSCULOSKELETAL: Patient had to be positioned into bed and put in dorsal lithotomy for exam   ENDOMETRIAL BIOPSY     The indications for endometrial biopsy were reviewed.   Risks of the biopsy including cramping, bleeding, infection, uterine perforation, inadequate specimen and need for additional procedures  were discussed. The patient states she understands and agrees to undergo procedure today. Consent was signed. Time out was performed. During the pelvic exam, the cervix was prepped with Betadine. A single-toothed tenaculum was placed on the anterior lip of the cervix  to stabilize it. An attempt was made to introduce the 3 mm pipelle into the endometrial cavity but there was significant stenosis.  Plastic dilators were not able to help with the stenosis so metal dilators were used and breached the stenosis.  The pipelle was introduced to a depth of 6 cm, and a scant amount of tissue was obtained even after three passes and sent to pathology. The instruments were removed from the patient's  vagina. Minimal bleeding from the cervix was noted. The patient tolerated the procedure well. Routine post-procedure instructions were given to the patient.     Labs and Imaging US Transvaginal Non-ob  07/26/2015   CLINICAL DATA:  Dysfunctional uterine bleeding. History of cerebral palsy and acromegaly. Obesity. Postmenopausal.  EXAM: TRANSABDOMINAL AND TRANSVAGINAL ULTRASOUND OF PELVIS  TECHNIQUE: Both transabdominal and transvaginal ultrasound examinations of the pelvis were performed. Transabdominal technique was performed for global imaging of the pelvis including uterus, ovaries, adnexal regions, and pelvic cul-de-sac. It was necessary to proceed with endovaginal exam following the transabdominal exam to visualize the endometrium, ovaries, adnexal regions.  COMPARISON:  CT of the abdomen and pelvis 11/18/2013  FINDINGS: Uterus  Measurements: 6.1 x 2.9 x 3.6 cm. No fibroids or other mass visualized.  Endometrium  Thickness: 1.5 mm. Numerous cysts are identified within the endometrium. Question of focal mass identified in the fundal portion of the endometrium measuring 8 mm.  Right ovary  Measurements: 3.0 x 1.8 x 2.2 cm. Simple cyst is 2.9 x 1.7 x 1.1 cm  Left ovary  Measurements: 1.6 x 1.0 x 1.5 cm. Normal appearance/no adnexal mass.  Other findings  No free fluid. Study quality is technically limited by patient's limited mobility and patient's inability to void completely.  IMPRESSION: 1. Small cysts and possible solid mass within the endometrium warrant further evaluation. In the setting of post-menopausal bleeding, endometrial sampling is indicated to exclude carcinoma. If results are benign, sonohysterogram should be considered for focal lesion work-up prior to hysteroscopy. (Ref: Radiological Reasoning: Algorithmic Workup of Abnormal Vaginal Bleeding with Endovaginal Sonography and Sonohysterography. AJR 2008; 389:H73-42) 2. Right ovarian cyst. This is almost certainly benign, but follow up ultrasound  is recommended in 1 year according to the Society of Radiologists in [place] Statement (D Clovis Riley et al. Management of Asymptomatic Ovarian and Other Adnexal Cysts Imaged at [place]: Society of Radiologists in [place] Statement 2010. Radiology 256 (Sept 2010): 876-811.).   Electronically Signed   By: Nolon Nations M.D.   On: 07/26/2015 14:18   US Pelvis Complete  07/26/2015   CLINICAL DATA:  Dysfunctional uterine bleeding. History of cerebral palsy and acromegaly. Obesity. Postmenopausal.  EXAM: TRANSABDOMINAL AND TRANSVAGINAL ULTRASOUND OF PELVIS  TECHNIQUE: Both transabdominal and transvaginal ultrasound examinations of the pelvis were performed. Transabdominal technique was performed for global imaging of the pelvis including uterus, ovaries, adnexal regions, and pelvic cul-de-sac. It was necessary to proceed with endovaginal exam following the transabdominal exam to visualize the endometrium, ovaries, adnexal regions.  COMPARISON:  CT of the abdomen and pelvis 11/18/2013  FINDINGS: Uterus  Measurements: 6.1 x 2.9 x 3.6 cm. No fibroids or other mass visualized.  Endometrium  Thickness: 1.5 mm. Numerous cysts are identified within the endometrium. Question of focal mass identified in the fundal portion of the endometrium measuring 8 mm.  Right ovary  Measurements: 3.0 x 1.8 x 2.2 cm. Simple cyst is 2.9 x 1.7 x 1.1 cm  Left ovary  Measurements: 1.6 x 1.0 x 1.5 cm. Normal  appearance/no adnexal mass.  Other findings  No free fluid. Study quality is technically limited by patient's limited mobility and patient's inability to void completely.  IMPRESSION: 1. Small cysts and possible solid mass within the endometrium warrant further evaluation. In the setting of post-menopausal bleeding, endometrial sampling is indicated to exclude carcinoma. If results are benign, sonohysterogram should be considered for focal lesion work-up prior to hysteroscopy. (Ref: Radiological  Reasoning: Algorithmic Workup of Abnormal Vaginal Bleeding with Endovaginal Sonography and Sonohysterography. AJR 2008; 540:J81-19) 2. Right ovarian cyst. This is almost certainly benign, but follow up ultrasound is recommended in 1 year according to the Society of Radiologists in [place] Statement (D Clovis Riley et al. Management of Asymptomatic Ovarian and Other Adnexal Cysts Imaged at [place]: Society of Radiologists in [place] Statement 2010. Radiology 256 (Sept 2010): 147-829.).   Electronically Signed   By: Nolon Nations M.D.   On: 07/26/2015 14:18    Assessment & Plan:  Discussed etiologies of postmenopausal bleeding, concern about precancerous/hyperplasia or cancerous etiology (5 to 10% percent of cases). Also discussed role of unopposed estrogen exposure in leading to thickened or proliferative endometrium; and its possible correlation with endometrial hyperplasia/carcinoma.  Discussed that obesity is linked to endometrial pathology given that adipose cells produce extra estrogen (estrone) which can cause the endometrium to have a significant amount of estrogen exposure.  However, she was reassured that endometrial atrophy and endometrial polyps are the most common causes of postmenopausal bleeding.  Uterine bleeding in postmenopausal women is usually light and self-limited. Exclusion of cancer is the main objective; therefore, treatment is usually unnecessary once cancer has been excluded.  The primary goal in the diagnostic evaluation of postmenopausal women with uterine bleeding is to exclude malignancy; this can include endometrial biopsy and pelvic ultrasound that she has obtained.   Further diagnostic evaluation is indicated for recurrent or persistent bleeding.    Will follow up results and manage accordingly.  Routine preventative health maintenance measures emphasized.  Ordered mammogram, BMD (history of DJD). Please refer to After Visit Summary  for other counseling recommendations.    Total face-to-face time with patient: 30 minutes. Over 50% of encounter was spent on counseling and coordination of care.   Verita Schneiders, MD, [place] Attending Obstetrician & Gynecologist, [place] for [facility]

## 2015-08-04 NOTE — Patient Instructions (Signed)
Postmenopausal Bleeding Postmenopausal bleeding is any bleeding a woman has after she has entered into menopause. Menopause is the end of a woman's fertile years. After menopause, a woman no longer ovulates or has menstrual periods.  Postmenopausal bleeding can be caused by various things. Any type of postmenopausal bleeding, even if it appears to be a typical menstrual period, is concerning. This should be evaluated by your health care provider. Any treatment will depend on the cause of the bleeding. HOME CARE INSTRUCTIONS Monitor your condition for any changes. The following actions may help to alleviate any discomfort you are experiencing:  Avoid the use of tampons and douches as directed by your health care provider.  Change your pads frequently.  Get regular pelvic exams and Pap tests.  Keep all follow-up appointments for diagnostic tests as directed by your health care provider. SEEK MEDICAL CARE IF:   Your bleeding lasts more than 1 week.  You have abdominal pain.  You have bleeding with sexual intercourse. SEEK IMMEDIATE MEDICAL CARE IF:   You have a fever, chills, headache, dizziness, muscle aches, and bleeding.  You have severe pain with bleeding.  You are passing blood clots.  You have bleeding and need more than 1 pad an hour.  You feel faint. MAKE SURE YOU:  Understand these instructions.  Will watch your condition.  Will get help right away if you are not doing well or get worse.   This information is not intended to replace advice given to you by your health care provider. Make sure you discuss any questions you have with your health care provider.   Document Released: 01/23/2006 Document Revised: 08/05/2013 Document Reviewed: 05/14/2013 Elsevier Interactive Patient Education 2016 Elsevier Inc.  

## 2015-08-04 NOTE — Progress Notes (Signed)
[facility] Mammogram and Bone Density October 21st @ 1530

## 2015-08-05 ENCOUNTER — Encounter: Payer: Self-pay | Admitting: Gastroenterology

## 2015-08-09 ENCOUNTER — Other Ambulatory Visit: Payer: Self-pay | Admitting: Obstetrics & Gynecology

## 2015-08-09 ENCOUNTER — Telehealth: Payer: Self-pay | Admitting: General Practice

## 2015-08-09 DIAGNOSIS — N950 Postmenopausal bleeding: Secondary | ICD-10-CM

## 2015-08-09 MED ORDER — MEDROXYPROGESTERONE ACETATE 10 MG PO TABS
10.0000 mg | ORAL_TABLET | Freq: Every day | ORAL | Status: DC
Start: 2015-08-09 — End: 2015-08-09

## 2015-08-09 NOTE — Telephone Encounter (Signed)
Called patient back and she states she is having bleeding like a period and heard from her pharmacy that we sent a prescription in. Discussed recommendations per Dr Harolyn Rutherford. Patient verbalized understanding to all and had no questions

## 2015-08-09 NOTE — Telephone Encounter (Signed)
Per Dr Harolyn Rutherford, patient's endo bx and pap were normal. Called patient and informed her of results. Patient verbalized understanding and states that she has started to bleed again and isn't on any medication. Told patient I will let Dr Harolyn Rutherford know and call her back whenever we hear something. Patient verbalized understanding and had no questions at this time.

## 2015-08-09 NOTE — Telephone Encounter (Signed)
Usually the bleeding from atrophic endometrium (seen on biopsy) is self-limited and does not need therapy.  But if bleeding is more than usual, Provera 10 mg daily x 10 days can be prescribed for patient (ordered; also instructed that she can double dose if needed for heavier bleeding), please tell her she can expect some withdrawal bleeding at end of therapy. If bleeding continues for a long time after this or gets more concerning; she will need further evaluation with hysteroscopy, D&C.  Katherine Schneiders, MD, [place] Attending Obstetrician & Gynecologist, [place] for [facility]

## 2015-08-11 LAB — CYTOLOGY - PAP

## 2015-08-12 ENCOUNTER — Ambulatory Visit (INDEPENDENT_AMBULATORY_CARE_PROVIDER_SITE_OTHER): Payer: Medicare Other | Admitting: Neurology

## 2015-08-12 ENCOUNTER — Encounter: Payer: Self-pay | Admitting: Neurology

## 2015-08-12 VITALS — BP 120/80 | HR 105 | Wt 220.0 lb

## 2015-08-12 DIAGNOSIS — F515 Nightmare disorder: Secondary | ICD-10-CM

## 2015-08-12 NOTE — Patient Instructions (Signed)
I recommend seeing a sleep specialist or psychotherapist for the nightmares. We will refer you to a sleep specialist.  If that is not effective, I would recommend seeing a psychotherapist.

## 2015-08-12 NOTE — Progress Notes (Signed)
NEUROLOGY CONSULTATION NOTE  [name] MRN: [id number] DOB: [birth year]  Referring provider: Dr. Ernestina Patches Primary care provider: Dr. Lisbeth Ply  Reason for consult:  nightmares  HISTORY OF PRESENT ILLNESS: [name] is a 59 year old right-handed female with cerebral palsy, acromegaly, Bipolar disorder, emphysema, IBS, multilevel cervical stenosis with radiculopathy and peripheral neuropathy who presents for cerebral palsy and nightmares.  History obtained by patient and notes from her orthopedist.  Images of cervical MRI and brain MRI reviewed.  [name] comes today to discuss nightmares.  She reports experiencing frightening vivid dreams for the past 8 years, which have gradually gotten worse.  They occur almost every night.  She has had dreams which include seeing elderly people wearing white gowns, Darth Vader, or dwarf in a Nazi uniform.  One night, she dreamed that her son was standing over her holding chicken wire.  She will scream out in her dream.    She does have depression and Bipolar disorder.  Over the past few years, she reports increased stress after discovering that her boyfriend was seeing another woman.  Last MRI of the brain with and without contrast from 09/06/14 shows nonspecific punctate hyperintensities in the periventricular and subcortical white matter.   For cervical and occipital pain, she had an MRI of the cervical spine on 02/11/15, which showed multilevel left-sided foraminal stenosis, including at C6-7, as well as bilaterally at C5-6, and with moderate central canal stenosis at C5-6.  For cervical radiculitis she is treated by her orthopedist/pain specialist.    PAST MEDICAL HISTORY: Past Medical History  Diagnosis Date  . Anxiety   . High cholesterol   . Cerebral palsy ([place])   . Melanoma in situ ([place])   . Dysplasia of cervix   . Neck mass   . IBS (irritable bowel syndrome)   . Acromegaly ([place])   . Headache(784.0)   . Bladder infection   .  Colitis   . History of chicken pox   . History of mumps   . History of measles   . Yeast infection   . Bacterial infection   . Trichomonas   . Ovarian cyst   . Depression   . Arthritis   . GERD (gastroesophageal reflux disease)   . Kidney infection   . Benign paroxysmal positional vertigo 02/24/2013  . Diabetes mellitus, type II ([place])   . Stenosis, cervical spine 05/16    PAST SURGICAL HISTORY: Past Surgical History  Procedure Laterality Date  . Leg tendon surgery    . Knee rotation    . Rotator cuff surgery    . Appendectomy    . Carpal tunnel release    . Ovarian cyst removal    . Replacement total knee bilateral  2006    MEDICATIONS: Current Outpatient Prescriptions on File Prior to Visit  Medication Sig Dispense Refill  . African Mango-Green Tea 1200-200 MG CAPS Take by mouth.    [name] alendronate (FOSAMAX) 70 MG tablet Take 70 mg by mouth once a week.     [name] amitriptyline (ELAVIL) 50 MG tablet Take 1 tablet (50 mg total) by mouth at bedtime. 90 tablet 0  . Apple Cider Vinegar 600 MG CAPS Take by mouth.    . Ascorbic Acid (VITAMIN C) 500 MG CAPS Take by mouth.    [name] atorvastatin (LIPITOR) 40 MG tablet Take 40 mg by mouth daily.    [name] BIOTIN 5000 PO Take by mouth.    . busPIRone (BUSPAR) 10 MG tablet Take  1 tablet (10 mg total) by mouth 2 (two) times daily. 180 tablet 0  . Calcium Carbonate-Vitamin D (CALCIUM 600 + D PO) Take 1 tablet by mouth daily.    . Chromium Picolinate 200 MCG CAPS Take 250 capsules by mouth 2 (two) times daily.     . cyanocobalamin 100 MCG tablet Take 100 mcg by mouth daily.    [name] FOLIC ACID PO Take 277 mg by mouth daily.    [name] gabapentin (NEURONTIN) 100 MG capsule Take 100 mg by mouth 2 (two) times daily.  0  . GARCINIA CAMBOGIA-CHROMIUM PO Take 100 mg by mouth daily.    . Horse Chestnut 300 MG CAPS Take 300 mg by mouth daily.    [name] Hyaluronic Acid-Vitamin C (HYALURONIC ACID PO) Take 100 mg by mouth daily.    [name] lamoTRIgine (LAMICTAL) 150 MG tablet Take  1 tablet (150 mg total) by mouth daily. 90 tablet 0  . Magnesium Oxide 250 MG TABS Take 1 tablet by mouth daily.    . medroxyPROGESTERone (PROVERA) 10 MG tablet TAKE 1 TABLET BY MOUTH DAILY, USE FOR 10 DAYS. CAN DOUBLE DOSE FOR HEAVY BLEEDING 90 tablet 2  . meloxicam (MOBIC) 7.5 MG tablet Take 7.5 mg by mouth daily.   1  . niacin 100 MG tablet Take 200 mg by mouth at bedtime.    [name] oxybutynin (DITROPAN) 5 MG tablet Take 5 mg by mouth 2 (two) times daily.     . Probiotic Product (PROBIOTIC DAILY PO) Take by mouth.    Lia Hopping Leaf POWD Take 1 capsule by mouth daily.     [name] spironolactone (ALDACTONE) 100 MG tablet   5  . tiZANidine (ZANAFLEX) 4 MG tablet Take 4 mg by mouth at bedtime.     . triamterene (DYRENIUM) 50 MG capsule Take 50 mg by mouth 2 (two) times daily.    . Vinpocetine POWD by Does not apply route.     No current facility-administered medications on file prior to visit.    ALLERGIES: Allergies  Allergen Reactions  . Miconazole Nitrate Itching  . Sulfonamide Derivatives Rash    FAMILY HISTORY: Family History  Problem Relation Age of Onset  . Suicidality Father   . Depression Father   . Alcohol abuse Father   . Drug abuse Brother   . Alcohol abuse Brother   . Esophageal cancer Brother   . Depression Maternal Aunt   . Alcohol abuse Brother   . Anxiety disorder Daughter   . Depression Daughter   . Suicidality Daughter   . Alcohol abuse Daughter   . Drug abuse Son   . Alcohol abuse Son   . Emphysema Sister     smoked  . Emphysema Father     smoked    SOCIAL HISTORY: Social History   Social History  . Marital Status: Divorced    Spouse Name: N/A  . Number of Children: N/A  . Years of Education: graduate   Occupational History  . Disabled    Social History Main Topics  . Smoking status: Former Smoker -- 1.00 packs/day for 31 years    Types: Cigarettes    Quit date: 11/18/1999  . Smokeless tobacco: Never Used  . Alcohol Use: No  . Drug Use: No  .  Sexual Activity: No   Other Topics Concern  . Not on file   Social History Narrative   Right handed, Disabled. Live boyfriend , [name], Caffeine 3-4 cups.  Disabled.    REVIEW  OF SYSTEMS: Constitutional: No fevers, chills, or sweats, no generalized fatigue, change in appetite Eyes: No visual changes, double vision, eye pain Ear, nose and throat: No hearing loss, ear pain, nasal congestion, sore throat Cardiovascular: No chest pain, palpitations Respiratory:  No shortness of breath at rest or with exertion, wheezes GastrointestinaI: No nausea, vomiting, diarrhea, abdominal pain, fecal incontinence Genitourinary:  No dysuria, urinary retention or frequency Musculoskeletal:  Neck pain Integumentary: No rash, pruritus, skin lesions Neurological: as above Psychiatric: depression, anxiety Endocrine: No palpitations, fatigue, diaphoresis, mood swings, change in appetite, change in weight, increased thirst Hematologic/Lymphatic:  No anemia, purpura, petechiae. Allergic/Immunologic: no itchy/runny eyes, nasal congestion, recent allergic reactions, rashes  PHYSICAL EXAM: Filed Vitals:   08/12/15 1433  BP: 120/80  Pulse: 105   General: No acute distress.  Head:  Normocephalic/atraumatic Eyes:  fundi unremarkable, without vessel changes, exudates, hemorrhages or papilledema. Neck: supple, bilateral paraspinal tenderness, full range of motion Back: No paraspinal tenderness Heart: regular rate and rhythm Lungs: Clear to auscultation bilaterally. Vascular: No carotid bruits. Neurological Exam: Mental status: alert and oriented to person, place, and time, recent and remote memory intact, fund of knowledge intact, attention and concentration intact, speech fluent and not dysarthric, language intact. Cranial nerves: CN I: not tested CN II: pupils equal, round and reactive to light, visual fields intact, fundi unremarkable, without vessel changes, exudates, hemorrhages or  papilledema. CN III, IV, VI:  full range of motion, no nystagmus, no ptosis CN V: facial sensation intact CN VII: upper and lower face symmetric CN VIII: hearing intact CN IX, X: gag intact, uvula midline CN XI: sternocleidomastoid and trapezius muscles intact CN XII: tongue midline Bulk & Tone: increased in lower extremities. Motor:  4/5 both hip flexors, otherwise 5/5 throughout Sensation:  Pinprick sensation intact, vibration sensation decreased in feet. Deep Tendon Reflexes:  2+ in upper extremties, 3+ in lower extremities, bilateral Babinski Finger to nose testing:  Without dysmetria.  Heel to shin:  Unable to assess Gait:  Unable to walk.  Sits in motorized scooter.  IMPRESSION: Nightmares Cerebral palsy  PLAN: 1.  Recommend evaluation by either sleep specialist or to receive psychotherapy.  We will refer her to a sleep specialist.  If that is not fruitful, I recommend speaking with a psychotherapist.  No follow up warranted.  Thank you for allowing me to take part in the care of this patient.  Metta Clines, DO  CC:  Laurence Spates, MD  Daiva Eves, MD

## 2015-08-15 NOTE — Progress Notes (Signed)
Note routed

## 2015-08-19 ENCOUNTER — Other Ambulatory Visit: Payer: Self-pay

## 2015-08-19 ENCOUNTER — Ambulatory Visit: Payer: Self-pay

## 2015-08-24 ENCOUNTER — Other Ambulatory Visit: Payer: Self-pay

## 2015-08-24 DIAGNOSIS — F515 Nightmare disorder: Secondary | ICD-10-CM

## 2015-09-19 ENCOUNTER — Ambulatory Visit: Payer: Self-pay

## 2015-09-19 ENCOUNTER — Inpatient Hospital Stay: Admission: RE | Admit: 2015-09-19 | Payer: Self-pay | Source: Ambulatory Visit

## 2015-10-06 ENCOUNTER — Other Ambulatory Visit (HOSPITAL_COMMUNITY): Payer: Self-pay | Admitting: Psychiatry

## 2015-10-08 ENCOUNTER — Other Ambulatory Visit: Payer: Self-pay | Admitting: Psychiatry

## 2015-10-13 ENCOUNTER — Ambulatory Visit (HOSPITAL_COMMUNITY): Payer: Self-pay | Admitting: Psychiatry

## 2015-10-13 ENCOUNTER — Encounter (HOSPITAL_COMMUNITY): Payer: Self-pay | Admitting: Psychiatry

## 2015-10-13 ENCOUNTER — Ambulatory Visit (INDEPENDENT_AMBULATORY_CARE_PROVIDER_SITE_OTHER): Payer: 59 | Admitting: Psychiatry

## 2015-10-13 DIAGNOSIS — F988 Other specified behavioral and emotional disorders with onset usually occurring in childhood and adolescence: Secondary | ICD-10-CM | POA: Diagnosis not present

## 2015-10-13 DIAGNOSIS — F419 Anxiety disorder, unspecified: Secondary | ICD-10-CM | POA: Diagnosis not present

## 2015-10-13 DIAGNOSIS — F319 Bipolar disorder, unspecified: Secondary | ICD-10-CM

## 2015-10-13 DIAGNOSIS — Z79899 Other long term (current) drug therapy: Secondary | ICD-10-CM

## 2015-10-13 MED ORDER — AMITRIPTYLINE HCL 50 MG PO TABS: 50.0000 mg | ORAL_TABLET | Freq: Every day | ORAL | Status: DC

## 2015-10-13 MED ORDER — LAMOTRIGINE 150 MG PO TABS: 150.0000 mg | ORAL_TABLET | Freq: Every day | ORAL | Status: DC

## 2015-10-13 MED ORDER — BUSPIRONE HCL 10 MG PO TABS: 10.0000 mg | ORAL_TABLET | Freq: Two times a day (BID) | ORAL | Status: DC
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
## 2015-10-18 DIAGNOSIS — E119 Type 2 diabetes mellitus without complications: Secondary | ICD-10-CM | POA: Diagnosis not present

## 2015-10-18 DIAGNOSIS — Z79899 Other long term (current) drug therapy: Secondary | ICD-10-CM | POA: Diagnosis not present

## 2015-10-18 DIAGNOSIS — E782 Mixed hyperlipidemia: Secondary | ICD-10-CM | POA: Diagnosis not present

## 2015-10-18 DIAGNOSIS — M810 Age-related osteoporosis without current pathological fracture: Secondary | ICD-10-CM | POA: Diagnosis not present

## 2015-10-18 DIAGNOSIS — R609 Edema, unspecified: Secondary | ICD-10-CM | POA: Diagnosis not present

## 2015-10-18 DIAGNOSIS — Z1389 Encounter for screening for other disorder: Secondary | ICD-10-CM | POA: Diagnosis not present

## 2015-10-18 DIAGNOSIS — K219 Gastro-esophageal reflux disease without esophagitis: Secondary | ICD-10-CM | POA: Diagnosis not present

## 2015-11-14 DIAGNOSIS — M4802 Spinal stenosis, cervical region: Secondary | ICD-10-CM | POA: Diagnosis not present

## 2015-11-14 DIAGNOSIS — M5412 Radiculopathy, cervical region: Secondary | ICD-10-CM | POA: Diagnosis not present

## 2015-11-14 DIAGNOSIS — M542 Cervicalgia: Secondary | ICD-10-CM | POA: Diagnosis not present

## 2015-11-18 DIAGNOSIS — M542 Cervicalgia: Secondary | ICD-10-CM | POA: Diagnosis not present

## 2015-11-18 DIAGNOSIS — M4802 Spinal stenosis, cervical region: Secondary | ICD-10-CM | POA: Diagnosis not present

## 2015-11-18 DIAGNOSIS — M5412 Radiculopathy, cervical region: Secondary | ICD-10-CM | POA: Diagnosis not present

## 2015-11-21 ENCOUNTER — Ambulatory Visit: Payer: Self-pay

## 2015-11-21 ENCOUNTER — Inpatient Hospital Stay: Admission: RE | Admit: 2015-11-21 | Payer: Self-pay | Source: Ambulatory Visit

## 2015-11-21 DIAGNOSIS — Z1231 Encounter for screening mammogram for malignant neoplasm of breast: Secondary | ICD-10-CM | POA: Diagnosis not present

## 2015-11-25 DIAGNOSIS — M810 Age-related osteoporosis without current pathological fracture: Secondary | ICD-10-CM | POA: Diagnosis not present

## 2015-11-25 DIAGNOSIS — M8589 Other specified disorders of bone density and structure, multiple sites: Secondary | ICD-10-CM | POA: Diagnosis not present

## 2015-11-30 ENCOUNTER — Encounter (HOSPITAL_COMMUNITY): Payer: Self-pay | Admitting: Emergency Medicine

## 2015-11-30 ENCOUNTER — Emergency Department (INDEPENDENT_AMBULATORY_CARE_PROVIDER_SITE_OTHER)
Admission: EM | Admit: 2015-11-30 | Discharge: 2015-11-30 | Disposition: A | Discharge status: Home / Self Care | Payer: Medicare Other | Source: Home / Self Care | Attending: Family Medicine | Admitting: Family Medicine

## 2015-11-30 DIAGNOSIS — J069 Acute upper respiratory infection, unspecified: Secondary | ICD-10-CM

## 2015-11-30 DIAGNOSIS — L989 Disorder of the skin and subcutaneous tissue, unspecified: Secondary | ICD-10-CM | POA: Diagnosis not present

## 2015-11-30 DIAGNOSIS — N390 Urinary tract infection, site not specified: Secondary | ICD-10-CM

## 2015-11-30 LAB — POCT URINALYSIS DIP (DEVICE)
Bilirubin Urine: NEGATIVE
GLUCOSE, UA: NEGATIVE mg/dL
KETONES UR: NEGATIVE mg/dL
Nitrite: POSITIVE — AB
PROTEIN: NEGATIVE mg/dL
SPECIFIC GRAVITY, URINE: 1.015 (ref 1.005–1.030)
Urobilinogen, UA: 0.2 mg/dL (ref 0.0–1.0)
pH: 8.5 — ABNORMAL HIGH (ref 5.0–8.0)

## 2015-11-30 MED ORDER — CEPHALEXIN 500 MG PO CAPS: 500.0000 mg | ORAL_CAPSULE | Freq: Four times a day (QID) | ORAL | Status: DC

## 2015-11-30 NOTE — ED Provider Notes (Signed)
CSN: [id number]     Arrival date & time 11/30/15  1428 History   First MD Initiated Contact with Patient 11/30/15 1530     Chief Complaint  Patient presents with  . Urinary Tract Infection   (Consider location/radiation/quality/duration/timing/severity/associated sxs/prior Treatment) HPI Comments: 60 year old female apparently confined to a wheelchair is complaining of dysuria for 3 days. Associated with urinary frequency. Denies fever, nausea, vomiting or flank pain. She has a history of type 2 diabetes mellitus.  As also complaining of persistent URI symptoms. She has been to her PCP twice and treated with antibiotic's and she continues to have minor URI symptoms. No fever or chills. Primarily nasal stuffiness and light drainage.  She is concerned about a 5 mm annular slightly raised rough lesion to the medial aspect of the right foot near the heel.  Patient is a 60 y.o. female presenting with urinary tract infection.  Urinary Tract Infection Associated symptoms: no fever     Past Medical History  Diagnosis Date  . Anxiety   . High cholesterol   . Cerebral palsy ([place])   . Melanoma in situ ([place])   . Dysplasia of cervix   . Neck mass   . IBS (irritable bowel syndrome)   . Acromegaly ([place])   . Headache(784.0)   . Bladder infection   . Colitis   . History of chicken pox   . History of mumps   . History of measles   . Yeast infection   . Bacterial infection   . Trichomonas   . Ovarian cyst   . Depression   . Arthritis   . GERD (gastroesophageal reflux disease)   . Kidney infection   . Benign paroxysmal positional vertigo 02/24/2013  . Diabetes mellitus, type II ([place])   . Stenosis, cervical spine 05/16   Past Surgical History  Procedure Laterality Date  . Leg tendon surgery    . Knee rotation    . Rotator cuff surgery    . Appendectomy    . Carpal tunnel release    . Ovarian cyst removal    . Replacement total knee bilateral  2006   Family History  Problem Relation  Age of Onset  . Suicidality Father   . Depression Father   . Alcohol abuse Father   . Drug abuse Brother   . Alcohol abuse Brother   . Esophageal cancer Brother   . Depression Maternal Aunt   . Alcohol abuse Brother   . Anxiety disorder Daughter   . Depression Daughter   . Suicidality Daughter   . Alcohol abuse Daughter   . Drug abuse Son   . Alcohol abuse Son   . Emphysema Sister     smoked  . Emphysema Father     smoked   Social History  Substance Use Topics  . Smoking status: Former Smoker -- 1.00 packs/day for 31 years    Types: Cigarettes    Quit date: 11/18/1999  . Smokeless tobacco: Never Used  . Alcohol Use: No   OB History    Gravida Para Term Preterm AB TAB SAB Ectopic Multiple Living   3 2        2      Review of Systems  Constitutional: Negative.  Negative for fever.  HENT: Positive for congestion and postnasal drip.   Respiratory: Negative for cough and shortness of breath.   Cardiovascular: Negative for chest pain.  Gastrointestinal: Negative.   Genitourinary: Positive for dysuria, urgency and frequency.  Skin: Negative for  rash.       As per history of present illness.  Neurological: Negative.   Psychiatric/Behavioral: Negative.     Allergies  Miconazole nitrate and Sulfonamide derivatives  Home Medications   Prior to Admission medications   Medication Sig Start Date End Date Taking? Authorizing Provider  ACCU-CHEK AVIVA PLUS test strip  08/01/15   Historical Provider, MD  African Mango-Green Tea 1200-200 MG CAPS Take by mouth.    Historical Provider, MD  alendronate (FOSAMAX) 70 MG tablet Take 70 mg by mouth once a week.  06/13/14   Historical Provider, MD  amitriptyline (ELAVIL) 50 MG tablet Take 1 tablet (50 mg total) by mouth at bedtime. 10/13/15   [name], MD  Apple Cider Vinegar 600 MG CAPS Take by mouth.    Historical Provider, MD  Ascorbic Acid (VITAMIN C) 500 MG CAPS Take by mouth.    Historical Provider, MD  ASSURE COMFORT LANCETS  30G [place]  08/10/15   Historical Provider, MD  atorvastatin (LIPITOR) 40 MG tablet Take 40 mg by mouth daily.    Historical Provider, MD  BIOTIN 5000 PO Take by mouth.    Historical Provider, MD  busPIRone (BUSPAR) 10 MG tablet Take 1 tablet (10 mg total) by mouth 2 (two) times daily. 10/13/15   [name], MD  Calcium Carbonate-Vitamin D (CALCIUM 600 + D PO) Take 1 tablet by mouth daily.    Historical Provider, MD  cephALEXin (KEFLEX) 500 MG capsule Take 1 capsule (500 mg total) by mouth 4 (four) times daily. 11/30/15   Janne Napoleon, NP  Chromium Picolinate 200 MCG CAPS Take 250 capsules by mouth 2 (two) times daily.     Historical Provider, MD  cyanocobalamin 100 MCG tablet Take 100 mcg by mouth daily.    Historical Provider, MD  fluticasone (FLONASE) 50 MCG/ACT nasal spray U 2 SPRAYS IEN QD FOR ALLERGIES 05/18/15   Historical Provider, MD  FOLIC ACID PO Take [id number] mg by mouth daily.    Historical Provider, MD  gabapentin (NEURONTIN) 100 MG capsule Take 100 mg by mouth 2 (two) times daily. 12/30/14   Historical Provider, MD  GARCINIA CAMBOGIA-CHROMIUM PO Take 100 mg by mouth daily.    Historical Provider, MD  [place]nut 300 MG CAPS Take 300 mg by mouth daily.    Historical Provider, MD  Hyaluronic Acid-Vitamin C (HYALURONIC ACID PO) Take 100 mg by mouth daily.    Historical Provider, MD  lamoTRIgine (LAMICTAL) 150 MG tablet Take 1 tablet (150 mg total) by mouth daily. 10/13/15   [name], MD  Lancet Devices (ADJUSTABLE LANCING DEVICE) MISC  08/01/15   Historical Provider, MD  Magnesium Oxide 250 MG TABS Take 1 tablet by mouth daily.    Historical Provider, MD  medroxyPROGESTERone (PROVERA) 10 MG tablet TAKE 1 TABLET BY MOUTH DAILY, USE FOR 10 DAYS. CAN DOUBLE DOSE FOR HEAVY BLEEDING 08/09/15   Osborne Oman, MD  meloxicam (MOBIC) 7.5 MG tablet Take 7.5 mg by mouth daily.  12/30/14   Historical Provider, MD  metFORMIN (GLUCOPHAGE-XR) 500 MG 24 hr tablet TK 1 T PO D 06/10/15   Historical  Provider, MD  niacin 100 MG tablet Take 200 mg by mouth at bedtime.    Historical Provider, MD  omeprazole (PRILOSEC) 40 MG capsule TK 1 C PO QD FOR ACID REFLUX 06/29/15   Historical Provider, MD  oxybutynin (DITROPAN) 5 MG tablet Take 5 mg by mouth 2 (two) times daily.  07/02/14  Historical Provider, MD  PROAIR HFA 108 (90 BASE) MCG/ACT inhaler INL 2 PFS PO Q 4 H PRN 08/05/15   Historical Provider, MD  Probiotic Product (PROBIOTIC DAILY PO) Take by mouth.    Historical Provider, MD  Darlina Rumpf POWD Take 1 capsule by mouth daily.     Historical Provider, MD  spironolactone (ALDACTONE) 100 MG tablet  01/28/15   Historical Provider, MD  tiZANidine (ZANAFLEX) 4 MG tablet Take 4 mg by mouth at bedtime.  06/30/14   Historical Provider, MD  triamterene (DYRENIUM) 50 MG capsule Take 50 mg by mouth 2 (two) times daily.    Historical Provider, MD  triamterene-hydrochlorothiazide (MAXZIDE-25) 37.5-25 MG tablet TK 1 T PO QAM 07/20/15   Historical Provider, MD  Vinpocetine POWD by Does not apply route.    Historical Provider, MD  XENICAL 120 MG capsule TK 1 CAPSULE PO TID 07/25/15   Historical Provider, MD   Meds Ordered and Administered this Visit  Medications - No data to display  BP 125/84 mmHg  Pulse 93  Temp(Src) 98.3 F (36.8 C) (Oral)  Resp 16  SpO2 100% No data found.   Physical Exam  Constitutional: She is oriented to person, place, and time. She appears well-developed and well-nourished. No distress.  Eyes: EOM are normal.  Neck: Normal range of motion. Neck supple.  Cardiovascular: Normal rate.   Pulmonary/Chest: Effort normal. No respiratory distress.  Neurological: She is alert and oriented to person, place, and time. She exhibits normal muscle tone.  Skin: Skin is warm.  Psychiatric: She has a normal mood and affect.  Nursing note and vitals reviewed.   ED Course  Procedures (including critical care time)  Labs Review Labs Reviewed  POCT URINALYSIS DIP (DEVICE) - Abnormal; Notable  for the following:    Hgb urine dipstick TRACE (*)    pH 8.5 (*)    Nitrite POSITIVE (*)    Leukocytes, UA SMALL (*)    All other components within normal limits    Imaging Review No results found.   Visual Acuity Review  Right Eye Distance:   Left Eye Distance:   Bilateral Distance:    Right Eye Near:   Left Eye Near:    Bilateral Near:         MDM   1. UTI (lower urinary tract infection)   2. Skin lesion   3. URI (upper respiratory infection)    Saline nasal spray Keflex for UTI USe a round Dr. Zoe Lan cusion over the foot lesion. Appears like a callous. No signs of infection.     Janne Napoleon, NP 11/30/15 1620

## 2015-11-30 NOTE — ED Notes (Signed)
Assisted to toilet  

## 2015-11-30 NOTE — ED Notes (Signed)
Patient has multiple concerns.  Patient has burning with urination, has noticed having difficulty holding urine-symptoms for 3 days.   Patient mentions sinus issues that have been evaluated and treated by pcp, but not completely better.  Finished antibiotics one week ago Patient has a sore on the heel of right foot that she is concerned about.

## 2015-11-30 NOTE — Discharge Instructions (Signed)
Urinary Tract Infection °Urinary tract infections (UTIs) can develop anywhere along your urinary tract. Your urinary tract is your body's drainage system for removing wastes and extra water. Your urinary tract includes two kidneys, two ureters, a bladder, and a urethra. Your kidneys are a pair of bean-shaped organs. Each kidney is about the size of your fist. They are located below your ribs, one on each side of your spine. °CAUSES °Infections are caused by microbes, which are microscopic organisms, including fungi, viruses, and bacteria. These organisms are so small that they can only be seen through a microscope. Bacteria are the microbes that most commonly cause UTIs. °SYMPTOMS  °Symptoms of UTIs may vary by age and gender of the patient and by the location of the infection. Symptoms in young women typically include a frequent and intense urge to urinate and a painful, burning feeling in the bladder or urethra during urination. Older women and men are more likely to be tired, shaky, and weak and have muscle aches and abdominal pain. A fever may mean the infection is in your kidneys. Other symptoms of a kidney infection include pain in your back or sides below the ribs, nausea, and vomiting. °DIAGNOSIS °To diagnose a UTI, your caregiver will ask you about your symptoms. Your caregiver will also ask you to provide a urine sample. The urine sample will be tested for bacteria and white blood cells. White blood cells are made by your body to help fight infection. °TREATMENT  °Typically, UTIs can be treated with medication. Because most UTIs are caused by a bacterial infection, they usually can be treated with the use of antibiotics. The choice of antibiotic and length of treatment depend on your symptoms and the type of bacteria causing your infection. °HOME CARE INSTRUCTIONS °· If you were prescribed antibiotics, take them exactly as your caregiver instructs you. Finish the medication even if you feel better after  you have only taken some of the medication. °· Drink enough water and fluids to keep your urine clear or pale yellow. °· Avoid caffeine, tea, and carbonated beverages. They tend to irritate your bladder. °· Empty your bladder often. Avoid holding urine for long periods of time. °· Empty your bladder before and after sexual intercourse. °· After a bowel movement, women should cleanse from front to back. Use each tissue only once. °SEEK MEDICAL CARE IF:  °· You have back pain. °· You develop a fever. °· Your symptoms do not begin to resolve within 3 days. °SEEK IMMEDIATE MEDICAL CARE IF:  °· You have severe back pain or lower abdominal pain. °· You develop chills. °· You have nausea or vomiting. °· You have continued burning or discomfort with urination. °MAKE SURE YOU:  °· Understand these instructions. °· Will watch your condition. °· Will get help right away if you are not doing well or get worse. °  °This information is not intended to replace advice given to you by your health care provider. Make sure you discuss any questions you have with your health care provider. °  °Document Released: 07/25/2005 Document Revised: 07/06/2015 Document Reviewed: 11/23/2011 °Elsevier Interactive Patient Education ©2016 Else[employer] °Upper Respiratory Infection, Adult °Most upper respiratory infections (URIs) are a viral infection of the air passages leading to the lungs. A URI affects the nose, throat, and upper air passages. The most common type of URI is nasopharyngitis and is typically referred to as "the common cold." °URIs run their course and usually go away on their own. Most of   the time, a URI does not require medical attention, but sometimes a bacterial infection in the upper airways can follow a viral infection. This is called a secondary infection. Sinus and middle ear infections are common types of secondary upper respiratory infections. °Bacterial pneumonia can also complicate a URI. A URI can worsen asthma and  chronic obstructive pulmonary disease (COPD). Sometimes, these complications can require emergency medical care and may be life threatening.  °CAUSES °Almost all URIs are caused by viruses. A virus is a type of germ and can spread from one person to another.  °RISKS FACTORS °You may be at risk for a URI if:  °· You smoke.   °· You have chronic heart or lung disease. °· You have a weakened defense (immune) system.   °· You are very young or very old.   °· You have nasal allergies or asthma. °· You work in crowded or poorly ventilated areas. °· You work in health care facilities or schools. °SIGNS AND SYMPTOMS  °Symptoms typically develop 2-3 days after you come in contact with a cold virus. Most viral URIs last 7-10 days. However, viral URIs from the influenza virus (flu virus) can last 14-18 days and are typically more severe. Symptoms may include:  °· Runny or stuffy (congested) nose.   °· Sneezing.   °· Cough.   °· Sore throat.   °· Headache.   °· Fatigue.   °· Fever.   °· Loss of appetite.   °· Pain in your forehead, behind your eyes, and over your cheekbones (sinus pain). °· Muscle aches.   °DIAGNOSIS  °Your health care provider may diagnose a URI by: °· Physical exam. °· Tests to check that your symptoms are not due to another condition such as: °· Strep throat. °· Sinusitis. °· Pneumonia. °· Asthma. °TREATMENT  °A URI goes away on its own with time. It cannot be cured with medicines, but medicines may be prescribed or recommended to relieve symptoms. Medicines may help: °· Reduce your fever. °· Reduce your cough. °· Relieve nasal congestion. °HOME CARE INSTRUCTIONS  °· Take medicines only as directed by your health care provider.   °· Gargle warm saltwater or take cough drops to comfort your throat as directed by your health care provider. °· Use a warm mist humidifier or inhale steam from a shower to increase air moisture. This may make it easier to breathe. °· Drink enough fluid to keep your urine clear or  pale yellow.   °· Eat soups and other clear broths and maintain good nutrition.   °· Rest as needed.   °· Return to work when your temperature has returned to normal or as your health care provider advises. You may need to stay home longer to avoid infecting others. You can also use a face mask and careful hand washing to prevent spread of the virus. °· Increase the usage of your inhaler if you have asthma.   °· Do not use any tobacco products, including cigarettes, chewing tobacco, or electronic cigarettes. If you need help quitting, ask your health care provider. °PREVENTION  °The best way to protect yourself from getting a cold is to practice good hygiene.  °· Avoid oral or hand contact with people with cold symptoms.   °· Wash your hands often if contact occurs.   °There is no clear evidence that vitamin C, vitamin E, echinacea, or exercise reduces the chance of developing a cold. However, it is always recommended to get plenty of rest, exercise, and practice good nutrition.  °SEEK MEDICAL CARE IF:  °· You are getting worse rather than better.   °·   Your symptoms are not controlled by medicine.   °· You have chills. °· You have worsening shortness of breath. °· You have brown or red mucus. °· You have yellow or brown nasal discharge. °· You have pain in your face, especially when you bend forward. °· You have a fever. °· You have swollen neck glands. °· You have pain while swallowing. °· You have white areas in the back of your throat. °SEEK IMMEDIATE MEDICAL CARE IF:  °· You have severe or persistent: °¨ Headache. °¨ Ear pain. °¨ Sinus pain. °¨ Chest pain. °· You have chronic lung disease and any of the following: °¨ Wheezing. °¨ Prolonged cough. °¨ Coughing up blood. °¨ A change in your usual mucus. °· You have a stiff neck. °· You have changes in your: °¨ Vision. °¨ Hearing. °¨ Thinking. °¨ Mood. °MAKE SURE YOU:  °· Understand these instructions. °· Will watch your condition. °· Will get help right away if you  are not doing well or get worse. °  °This information is not intended to replace advice given to you by your health care provider. Make sure you discuss any questions you have with your health care provider. °  °Document Released: 04/10/2001 Document Revised: 03/01/2015 Document Reviewed: 01/20/2014 °Elsevier Interactive Patient Education ©2016 Elsevier Inc. ° °

## 2015-12-07 ENCOUNTER — Encounter: Payer: Self-pay | Admitting: Cardiology

## 2015-12-15 DIAGNOSIS — Z79899 Other long term (current) drug therapy: Secondary | ICD-10-CM | POA: Diagnosis not present

## 2015-12-15 LAB — CBC WITH DIFFERENTIAL/PLATELET
Basophils Absolute: 0 10*3/uL (ref 0.0–0.1)
Basophils Relative: 0 % (ref 0–1)
EOS ABS: 0.1 10*3/uL (ref 0.0–0.7)
EOS PCT: 1 % (ref 0–5)
HCT: 48.4 % — ABNORMAL HIGH (ref 36.0–46.0)
Hemoglobin: 16.2 g/dL — ABNORMAL HIGH (ref 12.0–15.0)
LYMPHS ABS: 2.3 10*3/uL (ref 0.7–4.0)
Lymphocytes Relative: 23 % (ref 12–46)
MCH: 29 pg (ref 26.0–34.0)
MCHC: 33.5 g/dL (ref 30.0–36.0)
MCV: 86.6 fL (ref 78.0–100.0)
MONO ABS: 0.6 10*3/uL (ref 0.1–1.0)
MONOS PCT: 6 % (ref 3–12)
MPV: 9.7 fL (ref 8.6–12.4)
Neutro Abs: 7 10*3/uL (ref 1.7–7.7)
Neutrophils Relative %: 70 % (ref 43–77)
PLATELETS: 362 10*3/uL (ref 150–400)
RBC: 5.59 MIL/uL — ABNORMAL HIGH (ref 3.87–5.11)
RDW: 13.1 % (ref 11.5–15.5)
WBC: 10 10*3/uL (ref 4.0–10.5)

## 2015-12-15 LAB — HEMOGLOBIN A1C
Hgb A1c MFr Bld: 5.8 % — ABNORMAL HIGH (ref <5.7)
MEAN PLASMA GLUCOSE: 120 mg/dL — AB (ref <117)

## 2015-12-16 LAB — COMPLETE METABOLIC PANEL WITH GFR
ALT: 28 U/L (ref 6–29)
AST: 20 U/L (ref 10–35)
Albumin: 4.5 g/dL (ref 3.6–5.1)
Alkaline Phosphatase: 80 U/L (ref 33–130)
BUN: 19 mg/dL (ref 7–25)
CHLORIDE: 99 mmol/L (ref 98–110)
CO2: 22 mmol/L (ref 20–31)
Calcium: 9.9 mg/dL (ref 8.6–10.4)
Creat: 0.69 mg/dL (ref 0.50–1.05)
GLUCOSE: 104 mg/dL — AB (ref 65–99)
POTASSIUM: 4.4 mmol/L (ref 3.5–5.3)
SODIUM: 136 mmol/L (ref 135–146)
Total Bilirubin: 0.4 mg/dL (ref 0.2–1.2)
Total Protein: 7.1 g/dL (ref 6.1–8.1)

## 2015-12-16 LAB — TSH: TSH: 1.47 m[IU]/L

## 2015-12-21 ENCOUNTER — Encounter: Payer: Self-pay | Admitting: Obstetrics & Gynecology

## 2015-12-21 ENCOUNTER — Telehealth: Payer: Self-pay | Admitting: General Practice

## 2015-12-21 ENCOUNTER — Ambulatory Visit (INDEPENDENT_AMBULATORY_CARE_PROVIDER_SITE_OTHER): Payer: Medicare Other | Admitting: Obstetrics & Gynecology

## 2015-12-21 DIAGNOSIS — N950 Postmenopausal bleeding: Secondary | ICD-10-CM

## 2015-12-21 MED ORDER — MEGESTROL ACETATE 40 MG PO TABS: 40.0000 mg | ORAL_TABLET | Freq: Two times a day (BID) | ORAL | Status: DC

## 2015-12-21 NOTE — Telephone Encounter (Signed)
Opened in error

## 2015-12-21 NOTE — Progress Notes (Signed)
CLINIC ENCOUNTER NOTE  History:  60 y.o. G3P2, with cerebral palsy and limited mobility, here today for follow up of PMB.  She was evaluated and had ultrasound that showed possible polyp, but benign endometrial biopsy, normal cervical pap smear and negative HRHPV and 08/04/15.   She reports continued daily bleeding, small amount.  Not on any intervention right now. Has tried Mirena in past and it helped control this, but was spontaneously expelled.  She denies any abnormal vaginal discharge, pelvic pain or other concerns.   Past Medical History  Diagnosis Date  . Anxiety   . High cholesterol   . Cerebral palsy ([place])   . Melanoma in situ ([place])   . Dysplasia of cervix   . Neck mass   . IBS (irritable bowel syndrome)   . Acromegaly ([place])   . Headache(784.0)   . Bladder infection   . Colitis   . History of chicken pox   . History of mumps   . History of measles   . Yeast infection   . Bacterial infection   . Trichomonas   . Ovarian cyst   . Depression   . Arthritis   . GERD (gastroesophageal reflux disease)   . Kidney infection   . Benign paroxysmal positional vertigo 02/24/2013  . Diabetes mellitus, type II ([place])   . Stenosis, cervical spine 05/16    Past Surgical History  Procedure Laterality Date  . Leg tendon surgery    . Knee rotation    . Rotator cuff surgery    . Appendectomy    . Carpal tunnel release    . Ovarian cyst removal    . Replacement total knee bilateral  2006    The following portions of the patient's history were reviewed and updated as appropriate: allergies, current medications, past family history, past medical history, past social history, past surgical history and problem list.    Review of Systems:  Pertinent items noted in HPI and remainder of comprehensive ROS otherwise negative.  Objective:  Physical Exam AFVSS CONSTITUTIONAL: Well-developed, well-nourished female in no acute distress in .  HENT:  Normocephalic, atraumatic. External  right and left ear normal. Oropharynx is clear and moist EYES: Conjunctivae and EOM are normal. Pupils are equal, round, and reactive to light. No scleral icterus.  NECK: Normal range of motion, supple, no masses SKIN: Skin is warm and dry. No rash noted. Not diaphoretic. No erythema. No pallor. NEUROLOGIC: Alert and oriented to person, place, and time. Normal reflexes, muscle tone coordination. No cranial nerve deficit noted. PSYCHIATRIC: Normal mood and affect. Normal behavior. Normal judgment and thought content. CARDIOVASCULAR: Normal heart rate noted RESPIRATORY: Effort and breath sounds normal, no problems with respiration noted ABDOMEN: Soft, no distention noted.   PELVIC: Deferred MUSCULOSKELETAL: Normal range of motion. No edema noted.   Assessment & Plan:  Postmenopausal bleeding Discussed different modalities for treatment (oral progestin, progestin IUD, surgery). She wants to try medical management.   If bleeding continues for a long time after this or gets more concerning; she may need further evaluation with hysteroscopy, D&C +/- endometrial ablation or other intervention. - megestrol (MEGACE) 40 MG tablet; Take 1 tablet (40 mg total) by mouth 2 (two) times daily. Can increase to two tablets twice a day in the event of heavy bleeding  Dispense: 60 tablet; Refill: 5 Routine preventative health maintenance measures emphasized. Please refer to After Visit Summary for other counseling recommendations.   Return in about 2 months (around 02/18/2016) for Follow  up PMB on Megace.  Total face-to-face time with patient: 25 minutes. Over 50% of encounter was spent on counseling and coordination of care.   Verita Schneiders, MD, [place] Attending Obstetrician & Gynecologist, [place] for [facility]

## 2016-01-03 DIAGNOSIS — Z0000 Encounter for general adult medical examination without abnormal findings: Secondary | ICD-10-CM | POA: Diagnosis not present

## 2016-01-03 DIAGNOSIS — R350 Frequency of micturition: Secondary | ICD-10-CM | POA: Diagnosis not present

## 2016-01-03 DIAGNOSIS — R338 Other retention of urine: Secondary | ICD-10-CM | POA: Diagnosis not present

## 2016-01-03 DIAGNOSIS — R8271 Bacteriuria: Secondary | ICD-10-CM | POA: Diagnosis not present

## 2016-01-09 ENCOUNTER — Other Ambulatory Visit (HOSPITAL_COMMUNITY): Payer: Self-pay | Admitting: Psychiatry

## 2016-01-11 ENCOUNTER — Ambulatory Visit (INDEPENDENT_AMBULATORY_CARE_PROVIDER_SITE_OTHER): Payer: 59 | Admitting: Psychiatry

## 2016-01-11 ENCOUNTER — Encounter (HOSPITAL_COMMUNITY): Payer: Self-pay | Admitting: Psychiatry

## 2016-01-11 VITALS — BP 112/78 | HR 78 | Ht 67.0 in | Wt 225.0 lb

## 2016-01-11 DIAGNOSIS — F419 Anxiety disorder, unspecified: Secondary | ICD-10-CM

## 2016-01-11 DIAGNOSIS — F909 Attention-deficit hyperactivity disorder, unspecified type: Secondary | ICD-10-CM

## 2016-01-11 DIAGNOSIS — F319 Bipolar disorder, unspecified: Secondary | ICD-10-CM | POA: Diagnosis not present

## 2016-01-11 DIAGNOSIS — R3989 Other symptoms and signs involving the genitourinary system: Secondary | ICD-10-CM | POA: Diagnosis not present

## 2016-01-11 MED ORDER — AMITRIPTYLINE HCL 50 MG PO TABS: 50.0000 mg | ORAL_TABLET | Freq: Every day | ORAL | Status: DC

## 2016-01-11 MED ORDER — LAMOTRIGINE 150 MG PO TABS: 150.0000 mg | ORAL_TABLET | Freq: Every day | ORAL | Status: DC

## 2016-01-11 MED ORDER — BUSPIRONE HCL 10 MG PO TABS: 10.0000 mg | ORAL_TABLET | Freq: Two times a day (BID) | ORAL | Status: DC
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
## 2016-01-19 ENCOUNTER — Emergency Department (HOSPITAL_COMMUNITY)
Admission: EM | Admit: 2016-01-19 | Discharge: 2016-01-19 | Disposition: A | Discharge status: Home / Self Care | Payer: Medicare Other | Attending: Emergency Medicine | Admitting: Emergency Medicine

## 2016-01-19 ENCOUNTER — Encounter (HOSPITAL_COMMUNITY): Payer: Self-pay

## 2016-01-19 DIAGNOSIS — E669 Obesity, unspecified: Secondary | ICD-10-CM | POA: Diagnosis not present

## 2016-01-19 DIAGNOSIS — E7800 Pure hypercholesterolemia, unspecified: Secondary | ICD-10-CM | POA: Insufficient documentation

## 2016-01-19 DIAGNOSIS — M1990 Unspecified osteoarthritis, unspecified site: Secondary | ICD-10-CM | POA: Diagnosis not present

## 2016-01-19 DIAGNOSIS — Z87448 Personal history of other diseases of urinary system: Secondary | ICD-10-CM | POA: Diagnosis not present

## 2016-01-19 DIAGNOSIS — Z79899 Other long term (current) drug therapy: Secondary | ICD-10-CM | POA: Insufficient documentation

## 2016-01-19 DIAGNOSIS — Z7984 Long term (current) use of oral hypoglycemic drugs: Secondary | ICD-10-CM | POA: Diagnosis not present

## 2016-01-19 DIAGNOSIS — L97509 Non-pressure chronic ulcer of other part of unspecified foot with unspecified severity: Secondary | ICD-10-CM | POA: Insufficient documentation

## 2016-01-19 DIAGNOSIS — E119 Type 2 diabetes mellitus without complications: Secondary | ICD-10-CM | POA: Insufficient documentation

## 2016-01-19 DIAGNOSIS — Z86018 Personal history of other benign neoplasm: Secondary | ICD-10-CM | POA: Insufficient documentation

## 2016-01-19 DIAGNOSIS — R600 Localized edema: Secondary | ICD-10-CM | POA: Insufficient documentation

## 2016-01-19 DIAGNOSIS — K219 Gastro-esophageal reflux disease without esophagitis: Secondary | ICD-10-CM | POA: Insufficient documentation

## 2016-01-19 DIAGNOSIS — Z8742 Personal history of other diseases of the female genital tract: Secondary | ICD-10-CM | POA: Insufficient documentation

## 2016-01-19 DIAGNOSIS — Z87891 Personal history of nicotine dependence: Secondary | ICD-10-CM | POA: Insufficient documentation

## 2016-01-19 DIAGNOSIS — R339 Retention of urine, unspecified: Secondary | ICD-10-CM | POA: Diagnosis not present

## 2016-01-19 DIAGNOSIS — Z8619 Personal history of other infectious and parasitic diseases: Secondary | ICD-10-CM | POA: Insufficient documentation

## 2016-01-19 DIAGNOSIS — F329 Major depressive disorder, single episode, unspecified: Secondary | ICD-10-CM | POA: Diagnosis not present

## 2016-01-19 DIAGNOSIS — R7303 Prediabetes: Secondary | ICD-10-CM | POA: Diagnosis not present

## 2016-01-19 DIAGNOSIS — F419 Anxiety disorder, unspecified: Secondary | ICD-10-CM | POA: Insufficient documentation

## 2016-01-19 DIAGNOSIS — G4733 Obstructive sleep apnea (adult) (pediatric): Secondary | ICD-10-CM | POA: Diagnosis not present

## 2016-01-19 DIAGNOSIS — R319 Hematuria, unspecified: Secondary | ICD-10-CM | POA: Diagnosis not present

## 2016-01-19 DIAGNOSIS — Z8669 Personal history of other diseases of the nervous system and sense organs: Secondary | ICD-10-CM | POA: Insufficient documentation

## 2016-01-19 LAB — URINALYSIS, ROUTINE W REFLEX MICROSCOPIC
Bilirubin Urine: NEGATIVE
Glucose, UA: NEGATIVE mg/dL
Ketones, ur: NEGATIVE mg/dL
NITRITE: NEGATIVE
PH: 7 (ref 5.0–8.0)
Protein, ur: NEGATIVE mg/dL
SPECIFIC GRAVITY, URINE: 1.007 (ref 1.005–1.030)

## 2016-01-19 LAB — URINE MICROSCOPIC-ADD ON

## 2016-01-19 MED ORDER — CEPHALEXIN 500 MG PO CAPS: 500.0000 mg | ORAL_CAPSULE | Freq: Two times a day (BID) | ORAL | Status: DC

## 2016-01-19 MED ORDER — CEPHALEXIN 500 MG PO CAPS
500.0000 mg | ORAL_CAPSULE | Freq: Once | ORAL | Status: AC
  Administered 2016-01-19: 500 mg via ORAL
  Filled 2016-01-19: qty 1

## 2016-01-19 NOTE — Discharge Instructions (Signed)

## 2016-01-19 NOTE — ED Provider Notes (Signed)
CSN: [id number]     Arrival date & time 01/19/16  1831 History   First MD Initiated Contact with Patient 01/19/16 2038     Chief Complaint  Patient presents with  . Hematuria     (Consider location/radiation/quality/duration/timing/severity/associated sxs/prior Treatment) HPI Comments: 60 year old female with past medical history including cerebral palsy, type 2 diabetes mellitus, urinary retention who presents with hematuria. 2 weeks ago, the patient went to her urology office and was found to have urinary retention. A Foley catheter was placed at that time and she has follow-up scheduled for next week with urology. She reports that a few days ago she began noticing intermittent Mall amounts of blood and mucus in her urine and today her urine seemed more darkly blood tinged. She has had normal urine flow and denies any abdominal pain. No fevers, vomiting, or recent illness. She denies any trauma to her Foley line.  Patient is a 60 y.o. female presenting with hematuria. The history is provided by the patient.  Hematuria    Past Medical History  Diagnosis Date  . Anxiety   . High cholesterol   . Cerebral palsy ([place])   . Melanoma in situ ([place])   . Dysplasia of cervix   . Neck mass   . IBS (irritable bowel syndrome)   . Acromegaly ([place])   . Headache(784.0)   . Bladder infection   . Colitis   . History of chicken pox   . History of mumps   . History of measles   . Yeast infection   . Bacterial infection   . Trichomonas   . Ovarian cyst   . Depression   . Arthritis   . GERD (gastroesophageal reflux disease)   . Kidney infection   . Benign paroxysmal positional vertigo 02/24/2013  . Diabetes mellitus, type II ([place])   . Stenosis, cervical spine 05/16   Past Surgical History  Procedure Laterality Date  . Leg tendon surgery    . Knee rotation    . Rotator cuff surgery    . Appendectomy    . Carpal tunnel release    . Ovarian cyst removal    . Replacement total knee  bilateral  2006   Family History  Problem Relation Age of Onset  . Suicidality Father   . Depression Father   . Alcohol abuse Father   . Drug abuse Brother   . Alcohol abuse Brother   . Esophageal cancer Brother   . Depression Maternal Aunt   . Alcohol abuse Brother   . Anxiety disorder Daughter   . Depression Daughter   . Suicidality Daughter   . Alcohol abuse Daughter   . Drug abuse Son   . Alcohol abuse Son   . Emphysema Sister     smoked  . Emphysema Father     smoked   Social History  Substance Use Topics  . Smoking status: Former Smoker -- 1.00 packs/day for 31 years    Types: Cigarettes    Quit date: 11/18/1999  . Smokeless tobacco: Never Used  . Alcohol Use: No   OB History    Gravida Para Term Preterm AB TAB SAB Ectopic Multiple Living   3 2        2      Review of Systems  Genitourinary: Positive for hematuria.   10 Systems reviewed and are negative for acute change except as noted in the HPI.    Allergies  Miconazole nitrate and Sulfonamide derivatives  Home Medications  Prior to Admission medications   Medication Sig Start Date End Date Taking? Authorizing Provider  alendronate (FOSAMAX) 70 MG tablet Take 70 mg by mouth once a week.  06/13/14  Yes Historical Provider, MD  amitriptyline (ELAVIL) 50 MG tablet Take 1 tablet (50 mg total) by mouth at bedtime. 01/11/16  Yes [name], MD  Ascorbic Acid (VITAMIN C) 500 MG CAPS Take by mouth.   Yes Historical Provider, MD  atorvastatin (LIPITOR) 40 MG tablet Take 40 mg by mouth daily.   Yes Historical Provider, MD  busPIRone (BUSPAR) 10 MG tablet Take 1 tablet (10 mg total) by mouth 2 (two) times daily. 01/11/16  Yes [name], MD  gelatin 650 MG capsule Take 1,300 mg by mouth daily.   Yes Historical Provider, MD  lamoTRIgine (LAMICTAL) 150 MG tablet Take 1 tablet (150 mg total) by mouth daily. 01/11/16  Yes [name], MD  Magnesium Oxide 250 MG TABS Take 1 tablet by mouth daily.   Yes Historical  Provider, MD  medroxyPROGESTERone (PROVERA) 10 MG tablet TAKE 1 TABLET BY MOUTH DAILY, USE FOR 10 DAYS. CAN DOUBLE DOSE FOR HEAVY BLEEDING 08/09/15  Yes Osborne Oman, MD  Melatonin 10 MG TABS Take 20 mg by mouth at bedtime.   Yes Historical Provider, MD  metFORMIN (GLUCOPHAGE-XR) 500 MG 24 hr tablet take 1 tablet daily 06/10/15  Yes Historical Provider, MD  omeprazole (PRILOSEC) 40 MG capsule TK 1 C PO QD FOR ACID REFLUX 06/29/15  Yes Historical Provider, MD  oxybutynin (DITROPAN) 5 MG tablet Take 5 mg by mouth 2 (two) times daily.  07/02/14  Yes Historical Provider, MD  PROAIR HFA 108 (90 BASE) MCG/ACT inhaler INL 2 PFS PO Q 4 H PRN 08/05/15  Yes Historical Provider, MD  spironolactone (ALDACTONE) 100 MG tablet Take 100 mg by mouth daily.  01/28/15  Yes Historical Provider, MD  tiZANidine (ZANAFLEX) 4 MG tablet Take 4 mg by mouth at bedtime.  06/30/14  Yes Historical Provider, MD  XENICAL 120 MG capsule TK 1 CAPSULE PO THREE TIMES DAILY 07/25/15  Yes Historical Provider, MD  [place] test strip  08/01/15   Historical Provider, MD  ASSURE COMFORT LANCETS 30G [place]  08/10/15   Historical Provider, MD  cephALEXin (KEFLEX) 500 MG capsule Take 1 capsule (500 mg total) by mouth 2 (two) times daily. 01/19/16   Sharlett Iles, MD  Lancet Devices (ADJUSTABLE LANCING DEVICE) [place]  08/01/15   Historical Provider, MD  megestrol (MEGACE) 40 MG tablet Take 1 tablet (40 mg total) by mouth 2 (two) times daily. Can increase to two tablets twice a day in the event of heavy bleeding Patient not taking: Reported on 01/19/2016 12/21/15   Osborne Oman, MD   BP 121/83 mmHg  Pulse 97  Temp(Src) 98 F (36.7 C) (Oral)  Resp 18  SpO2 100% Physical Exam  Constitutional: She is oriented to person, place, and time. She appears well-developed and well-nourished. No distress.  HENT:  Head: Normocephalic and atraumatic.  Moist mucous membranes  Eyes: Conjunctivae are normal. Pupils are equal, round, and  reactive to light.  Neck: Neck supple.  Cardiovascular: Normal rate, regular rhythm and normal heart sounds.   No murmur heard. Pulmonary/Chest: Effort normal and breath sounds normal.  Abdominal: Soft. Bowel sounds are normal. She exhibits no distension. There is no tenderness.  Genitourinary:  Foley catheter in place draining clear yellow urine  Musculoskeletal: She exhibits edema.  3+ pitting edema bilateral  lower extremities  Neurological: She is alert and oriented to person, place, and time.  Fluent speech  Skin: Skin is warm and dry.  Erythema on bilateral legs from chronic venous stasis  Psychiatric: She has a normal mood and affect. Judgment normal.  Nursing note and vitals reviewed.   ED Course  Procedures (including critical care time) Labs Review Labs Reviewed  URINALYSIS, ROUTINE W REFLEX MICROSCOPIC (NOT AT [facility]) - Abnormal; Notable for the following:    APPearance CLOUDY (*)    Hgb urine dipstick LARGE (*)    Leukocytes, UA SMALL (*)    All other components within normal limits  URINE MICROSCOPIC-ADD ON - Abnormal; Notable for the following:    Squamous Epithelial / LPF 0-5 (*)    Bacteria, UA FEW (*)    All other components within normal limits  URINE CULTURE    Imaging Review No results found. I have personally reviewed and evaluated these lab results as part of my medical decision-making.   EKG Interpretation None      MDM   Final diagnoses:  Hematuria  Patient presents for evaluation of a few days of hematuria in the setting of Foley catheter for urinary retention. She was well-appearing with normal vital signs. No abdominal tenderness on exam. Foley was draining clear yellow urine on my examination. UA shows small leukocytes, 6-30 WBCs, too numerous to count RBCs and a few bacteria. Given the patient's 2 week history of Foley catheter and risk for associated infection, I elected to treat with Keflex. Also changed out Foley while in the ED.  I  discussed importance of follow-up with urology and I extensively reviewed return precautions including any severe hematuria with clots, urinary retention, or fever. The patient voiced understanding and was discharged in satisfactory condition.  Sharlett Iles, MD 01/19/16 2228

## 2016-01-19 NOTE — ED Notes (Signed)
Pt had urinary cath placed 2 weeks ago for difficulty urinating.  Not emptying.  Pt noted blood in urine for a few days.   Pt has not called her urology today.  Urine is flowing in bag.  No clots.

## 2016-01-19 NOTE — ED Notes (Signed)
Pt c/o blood in urine from foley.  Emptied bag and sent to lab, no blood noted in urine.

## 2016-01-20 DIAGNOSIS — M810 Age-related osteoporosis without current pathological fracture: Secondary | ICD-10-CM | POA: Diagnosis not present

## 2016-01-20 DIAGNOSIS — E559 Vitamin D deficiency, unspecified: Secondary | ICD-10-CM | POA: Diagnosis not present

## 2016-01-20 DIAGNOSIS — Z79899 Other long term (current) drug therapy: Secondary | ICD-10-CM | POA: Diagnosis not present

## 2016-01-20 DIAGNOSIS — E782 Mixed hyperlipidemia: Secondary | ICD-10-CM | POA: Diagnosis not present

## 2016-01-20 DIAGNOSIS — G809 Cerebral palsy, unspecified: Secondary | ICD-10-CM | POA: Diagnosis not present

## 2016-01-20 DIAGNOSIS — Z1389 Encounter for screening for other disorder: Secondary | ICD-10-CM | POA: Diagnosis not present

## 2016-01-20 DIAGNOSIS — Z0000 Encounter for general adult medical examination without abnormal findings: Secondary | ICD-10-CM | POA: Diagnosis not present

## 2016-01-20 DIAGNOSIS — E119 Type 2 diabetes mellitus without complications: Secondary | ICD-10-CM | POA: Diagnosis not present

## 2016-01-20 DIAGNOSIS — E669 Obesity, unspecified: Secondary | ICD-10-CM | POA: Diagnosis not present

## 2016-01-21 LAB — URINE CULTURE: Special Requests: NORMAL

## 2016-01-27 DIAGNOSIS — R338 Other retention of urine: Secondary | ICD-10-CM | POA: Diagnosis not present

## 2016-01-31 ENCOUNTER — Other Ambulatory Visit (HOSPITAL_COMMUNITY): Payer: Self-pay | Admitting: General Surgery

## 2016-01-31 DIAGNOSIS — E669 Obesity, unspecified: Secondary | ICD-10-CM

## 2016-02-03 ENCOUNTER — Ambulatory Visit (HOSPITAL_COMMUNITY)
Admission: RE | Admit: 2016-02-03 | Discharge: 2016-02-03 | Disposition: A | Discharge status: Home / Self Care | Payer: Medicare Other | Source: Ambulatory Visit | Attending: General Surgery | Admitting: General Surgery

## 2016-02-03 DIAGNOSIS — E669 Obesity, unspecified: Secondary | ICD-10-CM

## 2016-02-06 DIAGNOSIS — N3289 Other specified disorders of bladder: Secondary | ICD-10-CM | POA: Diagnosis not present

## 2016-02-06 DIAGNOSIS — R350 Frequency of micturition: Secondary | ICD-10-CM | POA: Diagnosis not present

## 2016-02-06 DIAGNOSIS — R3915 Urgency of urination: Secondary | ICD-10-CM | POA: Diagnosis not present

## 2016-02-06 DIAGNOSIS — N3020 Other chronic cystitis without hematuria: Secondary | ICD-10-CM | POA: Diagnosis not present

## 2016-02-14 ENCOUNTER — Ambulatory Visit (HOSPITAL_COMMUNITY): Payer: Medicare Other

## 2016-02-15 ENCOUNTER — Ambulatory Visit (HOSPITAL_COMMUNITY)
Admission: RE | Admit: 2016-02-15 | Discharge: 2016-02-15 | Disposition: A | Discharge status: Home / Self Care | Payer: Medicare Other | Source: Ambulatory Visit | Attending: General Surgery | Admitting: General Surgery

## 2016-02-15 ENCOUNTER — Encounter: Payer: Self-pay | Admitting: Obstetrics & Gynecology

## 2016-02-15 ENCOUNTER — Ambulatory Visit (INDEPENDENT_AMBULATORY_CARE_PROVIDER_SITE_OTHER): Payer: Medicare Other | Admitting: Obstetrics & Gynecology

## 2016-02-15 VITALS — BP 108/53 | HR 69 | Wt 225.0 lb

## 2016-02-15 DIAGNOSIS — N950 Postmenopausal bleeding: Secondary | ICD-10-CM | POA: Diagnosis not present

## 2016-02-15 DIAGNOSIS — Z6835 Body mass index (BMI) 35.0-35.9, adult: Secondary | ICD-10-CM | POA: Diagnosis not present

## 2016-02-15 DIAGNOSIS — E669 Obesity, unspecified: Secondary | ICD-10-CM | POA: Diagnosis not present

## 2016-02-15 DIAGNOSIS — Z01818 Encounter for other preprocedural examination: Secondary | ICD-10-CM | POA: Diagnosis not present

## 2016-02-15 NOTE — Progress Notes (Signed)
   CLINIC ENCOUNTER NOTE  History:  60 y.o. G3P2, with cerebral palsy and limited mobility, here today for follow up of PMB. She was evaluated and had ultrasound that showed possible polyp, but benign endometrial biopsy, normal cervical pap smear and negative HRHPV and 08/04/15. Was prescribed Megace during last visit two months ago.  Currently, denies any bleeding from vagina but has bladder related bleeding; noted on cath specimen. Will follow up with Urology for further evaluation.  She denies any abnormal vaginal discharge, pelvic pain or other concerns.   Past Medical History  Diagnosis Date  . Anxiety   . High cholesterol   . Cerebral palsy ([place])   . Melanoma in situ ([place])   . Dysplasia of cervix   . Neck mass   . IBS (irritable bowel syndrome)   . Acromegaly ([place])   . Headache(784.0)   . Bladder infection   . Colitis   . History of chicken pox   . History of mumps   . History of measles   . Yeast infection   . Bacterial infection   . Trichomonas   . Ovarian cyst   . Depression   . Arthritis   . GERD (gastroesophageal reflux disease)   . Kidney infection   . Benign paroxysmal positional vertigo 02/24/2013  . Diabetes mellitus, type II ([place])   . Stenosis, cervical spine 05/16    Past Surgical History  Procedure Laterality Date  . Leg tendon surgery    . Knee rotation    . Rotator cuff surgery    . Appendectomy    . Carpal tunnel release    . Ovarian cyst removal    . Replacement total knee bilateral  2006    The following portions of the patient's history were reviewed and updated as appropriate: allergies, current medications, past family history, past medical history, past social history, past surgical history and problem list.   Health Maintenance:  Normal pap and negative HRHPV on 08/04/2015.  Normal mammogram in 2016 normal at [facility].   Review of Systems:  Pertinent items noted in HPI and remainder of comprehensive ROS otherwise  negative.  Objective:  Physical Exam BP 108/53 mmHg  Pulse 69  Wt 225 lb (102.059 kg) CONSTITUTIONAL: Well-developed, well-nourished female in no acute distress. In wheelchair. HENT:  Normocephalic, atraumatic. External right and left ear normal. Oropharynx is clear and moist EYES: Conjunctivae and EOM are normal. Pupils are equal, round, and reactive to light. No scleral icterus.  NECK: Normal range of motion, supple, no masses SKIN: Skin is warm and dry. No rash noted. Not diaphoretic. No erythema. No pallor. NEUROLOGIC: Alert and oriented to person, place, and time. Normal reflexes, muscle tone coordination. No cranial nerve deficit noted. PSYCHIATRIC: Normal mood and affect. Normal behavior. Normal judgment and thought content. CARDIOVASCULAR: Normal heart rate noted RESPIRATORY: Effort and breath sounds normal, no problems with respiration noted ABDOMEN: Soft, no distention noted.   PELVIC: Deferred MUSCULOSKELETAL: Normal range of motion. No edema noted.   Assessment & Plan:  Postmenopausal bleeding Managed on Megace. Will follow up with Urology regarding bladder-related bleeding Uterine bleeding precautions advised. Routine preventative health maintenance measures emphasized. Please refer to After Visit Summary for other counseling recommendations.    Verita Schneiders, MD, [place] Attending Obstetrician & Gynecologist, [place] for [facility]

## 2016-02-21 DIAGNOSIS — N3289 Other specified disorders of bladder: Secondary | ICD-10-CM | POA: Diagnosis not present

## 2016-02-21 DIAGNOSIS — M545 Low back pain: Secondary | ICD-10-CM | POA: Diagnosis not present

## 2016-02-21 DIAGNOSIS — R338 Other retention of urine: Secondary | ICD-10-CM | POA: Diagnosis not present

## 2016-02-21 DIAGNOSIS — Z0000 Encounter for general adult medical examination without abnormal findings: Secondary | ICD-10-CM | POA: Diagnosis not present

## 2016-02-21 DIAGNOSIS — R3915 Urgency of urination: Secondary | ICD-10-CM | POA: Diagnosis not present

## 2016-02-23 DIAGNOSIS — M2600 Unspecified anomaly of jaw size: Secondary | ICD-10-CM | POA: Diagnosis not present

## 2016-03-06 DIAGNOSIS — Z0000 Encounter for general adult medical examination without abnormal findings: Secondary | ICD-10-CM | POA: Diagnosis not present

## 2016-03-06 DIAGNOSIS — R338 Other retention of urine: Secondary | ICD-10-CM | POA: Diagnosis not present

## 2016-03-06 DIAGNOSIS — R350 Frequency of micturition: Secondary | ICD-10-CM | POA: Diagnosis not present

## 2016-03-06 DIAGNOSIS — R3915 Urgency of urination: Secondary | ICD-10-CM | POA: Diagnosis not present

## 2016-03-06 DIAGNOSIS — N3289 Other specified disorders of bladder: Secondary | ICD-10-CM | POA: Diagnosis not present

## 2016-03-08 DIAGNOSIS — M542 Cervicalgia: Secondary | ICD-10-CM | POA: Diagnosis not present

## 2016-03-12 ENCOUNTER — Ambulatory Visit (INDEPENDENT_AMBULATORY_CARE_PROVIDER_SITE_OTHER): Payer: 59 | Admitting: Psychiatry

## 2016-03-12 ENCOUNTER — Encounter (HOSPITAL_COMMUNITY): Payer: Self-pay | Admitting: Psychiatry

## 2016-03-12 VITALS — BP 120/78 | HR 89 | Ht 67.0 in | Wt 225.0 lb

## 2016-03-12 DIAGNOSIS — F419 Anxiety disorder, unspecified: Secondary | ICD-10-CM

## 2016-03-12 DIAGNOSIS — F319 Bipolar disorder, unspecified: Secondary | ICD-10-CM

## 2016-03-12 MED ORDER — LAMOTRIGINE 150 MG PO TABS: 150.0000 mg | ORAL_TABLET | Freq: Every day | ORAL | Status: DC

## 2016-03-12 MED ORDER — BUSPIRONE HCL 10 MG PO TABS: 10.0000 mg | ORAL_TABLET | Freq: Two times a day (BID) | ORAL | Status: DC

## 2016-03-12 MED ORDER — AMITRIPTYLINE HCL 50 MG PO TABS: 50.0000 mg | ORAL_TABLET | Freq: Every day | ORAL | Status: DC
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
## 2016-03-27 DIAGNOSIS — N3020 Other chronic cystitis without hematuria: Secondary | ICD-10-CM | POA: Diagnosis not present

## 2016-03-27 DIAGNOSIS — R8271 Bacteriuria: Secondary | ICD-10-CM | POA: Diagnosis not present

## 2016-03-27 DIAGNOSIS — Z0000 Encounter for general adult medical examination without abnormal findings: Secondary | ICD-10-CM | POA: Diagnosis not present

## 2016-03-27 DIAGNOSIS — R338 Other retention of urine: Secondary | ICD-10-CM | POA: Diagnosis not present

## 2016-04-02 DIAGNOSIS — M542 Cervicalgia: Secondary | ICD-10-CM | POA: Diagnosis not present

## 2016-04-12 ENCOUNTER — Other Ambulatory Visit: Payer: Self-pay | Admitting: Neurosurgery

## 2016-04-12 ENCOUNTER — Ambulatory Visit (HOSPITAL_COMMUNITY): Payer: Self-pay | Admitting: Psychiatry

## 2016-04-12 DIAGNOSIS — M542 Cervicalgia: Secondary | ICD-10-CM

## 2016-04-13 ENCOUNTER — Ambulatory Visit
Admission: RE | Admit: 2016-04-13 | Discharge: 2016-04-13 | Disposition: A | Discharge status: Home / Self Care | Payer: Medicare Other | Source: Ambulatory Visit | Attending: Neurosurgery | Admitting: Neurosurgery

## 2016-04-13 DIAGNOSIS — M50222 Other cervical disc displacement at C5-C6 level: Secondary | ICD-10-CM | POA: Diagnosis not present

## 2016-04-13 DIAGNOSIS — M542 Cervicalgia: Secondary | ICD-10-CM

## 2016-04-16 DIAGNOSIS — M4722 Other spondylosis with radiculopathy, cervical region: Secondary | ICD-10-CM | POA: Diagnosis not present

## 2016-04-16 DIAGNOSIS — M542 Cervicalgia: Secondary | ICD-10-CM | POA: Diagnosis not present

## 2016-04-18 DIAGNOSIS — K219 Gastro-esophageal reflux disease without esophagitis: Secondary | ICD-10-CM | POA: Diagnosis not present

## 2016-04-18 DIAGNOSIS — G4733 Obstructive sleep apnea (adult) (pediatric): Secondary | ICD-10-CM | POA: Diagnosis not present

## 2016-04-18 DIAGNOSIS — E7800 Pure hypercholesterolemia, unspecified: Secondary | ICD-10-CM | POA: Diagnosis not present

## 2016-04-18 DIAGNOSIS — E669 Obesity, unspecified: Secondary | ICD-10-CM | POA: Diagnosis not present

## 2016-04-18 DIAGNOSIS — R7303 Prediabetes: Secondary | ICD-10-CM | POA: Diagnosis not present

## 2016-04-24 ENCOUNTER — Other Ambulatory Visit: Payer: Self-pay | Admitting: Neurosurgery

## 2016-05-02 ENCOUNTER — Encounter (HOSPITAL_COMMUNITY)
Admission: RE | Admit: 2016-05-02 | Discharge: 2016-05-02 | Disposition: A | Discharge status: Home / Self Care | Payer: Medicare Other | Source: Ambulatory Visit | Attending: Neurosurgery | Admitting: Neurosurgery

## 2016-05-02 ENCOUNTER — Encounter (HOSPITAL_COMMUNITY): Payer: Self-pay

## 2016-05-02 DIAGNOSIS — Z7951 Long term (current) use of inhaled steroids: Secondary | ICD-10-CM | POA: Diagnosis not present

## 2016-05-02 DIAGNOSIS — K589 Irritable bowel syndrome without diarrhea: Secondary | ICD-10-CM

## 2016-05-02 DIAGNOSIS — M50122 Cervical disc disorder at C5-C6 level with radiculopathy: Secondary | ICD-10-CM | POA: Diagnosis not present

## 2016-05-02 DIAGNOSIS — E119 Type 2 diabetes mellitus without complications: Secondary | ICD-10-CM

## 2016-05-02 DIAGNOSIS — Z87891 Personal history of nicotine dependence: Secondary | ICD-10-CM

## 2016-05-02 DIAGNOSIS — R3914 Feeling of incomplete bladder emptying: Secondary | ICD-10-CM | POA: Diagnosis not present

## 2016-05-02 DIAGNOSIS — Z01812 Encounter for preprocedural laboratory examination: Secondary | ICD-10-CM | POA: Insufficient documentation

## 2016-05-02 DIAGNOSIS — K219 Gastro-esophageal reflux disease without esophagitis: Secondary | ICD-10-CM

## 2016-05-02 DIAGNOSIS — I10 Essential (primary) hypertension: Secondary | ICD-10-CM

## 2016-05-02 DIAGNOSIS — Z01818 Encounter for other preprocedural examination: Secondary | ICD-10-CM

## 2016-05-02 DIAGNOSIS — Z0183 Encounter for blood typing: Secondary | ICD-10-CM | POA: Insufficient documentation

## 2016-05-02 DIAGNOSIS — Z7984 Long term (current) use of oral hypoglycemic drugs: Secondary | ICD-10-CM | POA: Insufficient documentation

## 2016-05-02 DIAGNOSIS — J449 Chronic obstructive pulmonary disease, unspecified: Secondary | ICD-10-CM | POA: Diagnosis not present

## 2016-05-02 DIAGNOSIS — E7800 Pure hypercholesterolemia, unspecified: Secondary | ICD-10-CM

## 2016-05-02 DIAGNOSIS — G809 Cerebral palsy, unspecified: Secondary | ICD-10-CM | POA: Insufficient documentation

## 2016-05-02 DIAGNOSIS — Z7983 Long term (current) use of bisphosphonates: Secondary | ICD-10-CM | POA: Insufficient documentation

## 2016-05-02 DIAGNOSIS — E220 Acromegaly and pituitary gigantism: Secondary | ICD-10-CM | POA: Diagnosis not present

## 2016-05-02 DIAGNOSIS — Z96653 Presence of artificial knee joint, bilateral: Secondary | ICD-10-CM | POA: Insufficient documentation

## 2016-05-02 DIAGNOSIS — M4802 Spinal stenosis, cervical region: Secondary | ICD-10-CM | POA: Diagnosis not present

## 2016-05-02 DIAGNOSIS — R310 Gross hematuria: Secondary | ICD-10-CM | POA: Diagnosis not present

## 2016-05-02 DIAGNOSIS — M4722 Other spondylosis with radiculopathy, cervical region: Secondary | ICD-10-CM | POA: Insufficient documentation

## 2016-05-02 DIAGNOSIS — Z79899 Other long term (current) drug therapy: Secondary | ICD-10-CM

## 2016-05-02 HISTORY — DX: Urinary tract infection, site not specified: N39.0

## 2016-05-02 LAB — CBC
HCT: 47.5 % — ABNORMAL HIGH (ref 36.0–46.0)
Hemoglobin: 15.4 g/dL — ABNORMAL HIGH (ref 12.0–15.0)
MCH: 28.4 pg (ref 26.0–34.0)
MCHC: 32.4 g/dL (ref 30.0–36.0)
MCV: 87.5 fL (ref 78.0–100.0)
PLATELETS: 387 10*3/uL (ref 150–400)
RBC: 5.43 MIL/uL — ABNORMAL HIGH (ref 3.87–5.11)
RDW: 14 % (ref 11.5–15.5)
WBC: 11.6 10*3/uL — AB (ref 4.0–10.5)

## 2016-05-02 LAB — BASIC METABOLIC PANEL
ANION GAP: 9 (ref 5–15)
BUN: 9 mg/dL (ref 6–20)
CALCIUM: 9.9 mg/dL (ref 8.9–10.3)
CO2: 25 mmol/L (ref 22–32)
CREATININE: 0.68 mg/dL (ref 0.44–1.00)
Chloride: 101 mmol/L (ref 101–111)
Glucose, Bld: 125 mg/dL — ABNORMAL HIGH (ref 65–99)
Potassium: 4.1 mmol/L (ref 3.5–5.1)
SODIUM: 135 mmol/L (ref 135–145)

## 2016-05-02 LAB — SURGICAL PCR SCREEN
MRSA, PCR: NEGATIVE
STAPHYLOCOCCUS AUREUS: NEGATIVE

## 2016-05-02 LAB — TYPE AND SCREEN
ABO/RH(D): A POS
Antibody Screen: NEGATIVE

## 2016-05-02 LAB — ABO/RH: ABO/RH(D): A POS

## 2016-05-02 LAB — GLUCOSE, CAPILLARY: GLUCOSE-CAPILLARY: 157 mg/dL — AB (ref 65–99)

## 2016-05-02 MED ORDER — CHLORHEXIDINE GLUCONATE CLOTH 2 % EX PADS: 6.0000 | MEDICATED_PAD | Freq: Once | CUTANEOUS | Status: DC

## 2016-05-02 NOTE — Progress Notes (Signed)
DR Christella Noa notified of current UIT. Warden PA called results.to be started on cephalexin 500mg  bid

## 2016-05-02 NOTE — Progress Notes (Signed)
Pt was c/o of uti. However she spilled urine.  She has appt today with urology and will try for another specimen.  Will f/u with PA to get results.

## 2016-05-02 NOTE — Pre-Procedure Instructions (Signed)
    [name] [facility]  05/02/2016      [facility] [address] [facility] [place], [address] [place]  [address] Phone: [phone] Fax: [phone]    Your procedure is scheduled on 05/04/16.  Report to [facility] Admitting at [place] A.M.  Call this number if you have problems the morning of surgery:  [phone]   Remember:  Do not eat food or drink liquids after midnight.  Take these medicines the morning of surgery with A SIP OF WATER --tylenol #3.flonase,prilosec,inhalers   Do not wear jewelry, make-up or nail polish.  Do not wear lotions, powders, or perfumes.  You may wear deoderant.  Do not shave 48 hours prior to surgery.  Men may shave face and neck.  Do not bring valuables to the hospital.  [facility] is not responsible for any belongings or valuables.  Contacts, dentures or bridgework may not be worn into surgery.  Leave your suitcase in the car.  After surgery it may be brought to your room.  For patients admitted to the hospital, discharge time will be determined by your treatment team.  Patients discharged the day of surgery will not be allowed to drive home.   Name and phone number of your driver:    Special instructions:    Please read over the following fact sheets that you were given. MRSA Information

## 2016-05-03 ENCOUNTER — Ambulatory Visit (HOSPITAL_COMMUNITY): Payer: Medicare Other | Admitting: Vascular Surgery

## 2016-05-03 ENCOUNTER — Ambulatory Visit (HOSPITAL_COMMUNITY): Payer: Medicare Other | Admitting: Certified Registered"

## 2016-05-03 LAB — HEMOGLOBIN A1C
HEMOGLOBIN A1C: 6.9 % — AB (ref 4.8–5.6)
MEAN PLASMA GLUCOSE: 151 mg/dL

## 2016-05-03 MED ORDER — CEFAZOLIN SODIUM-DEXTROSE 2-4 GM/100ML-% IV SOLN
2.0000 g | INTRAVENOUS | Status: AC
  Filled 2016-05-03: qty 100

## 2016-05-03 NOTE — Progress Notes (Signed)
Anesthesia Chart Review: Patient is a 60 year old female scheduled for C5-6 ACDF on 05/04/16 by Dr. Christella Noa.  History includes former smoker, anxiety, hypercholesterolemia, cerebral palsy, melanoma in situ, IBS, acromegaly, headaches, colitis, GERD, benign paroxysmal positional vertigo, diabetes mellitus type 2, bipolar depression, appendectomy, bilateral TKA. Patient reported UTI symptoms and was seeing Dr. Karsten Ro yesterday--Dr. Christella Noa notified by PAT RN. BMI is consistent with obesity.  PCP is Dr. Daiva Eves. Psychiatrist is Dr. Adele Schilder.  Meds include Tylenol No. 3, Fosamax, amitriptyline, Lipitor, BuSpar, Flonase, lobe Knechtel, magnesium oxide, Megace, melatonin, metformin, Prilosec, Ditropan, Pro-air, Rapaflo, Aldactone,Myrbetriq, Zenical, Maxzide, Zanflex. (Per PAT RN note, she was started on cephalexin 500 mg twice a day by Rosalyn Gess, NP for UTI.)   05/02/16 EKG: NSR, septal infarct (age undetermined). Since 07/07/13 tracing, first degree AV block no longer present.  Remote history of a negative pharmacologic stress Myoview study in 2006.  07/17/13 Spirometry showed mild airway obstruction. Saw Dr. Melvyn Novas.  Preoperative labs noted. Cr 0.68. WBC 11.6, H/H 15.4/47.5. A1c 6.9. T&S done. UA spilled (to be done at urologist office on 05/02/16).  Patient was seen by her urologist yesterday for possible UTI and was started on cephalexin. Dr. Hewitt Shorts office has been notified. Would defer if this interferes with timing of procedure to Dr. Christella Noa. Further evaluation by her anesthesiologist on the day of surgery.  [facility] Short Stay Center/Anesthesiology Phone [phone] 05/03/2016 12:05 PM

## 2016-05-04 ENCOUNTER — Ambulatory Visit (HOSPITAL_COMMUNITY)
Admission: RE | Admit: 2016-05-04 | Discharge: 2016-05-25 | Disposition: A | Discharge status: Home / Self Care | Payer: Medicare Other | Source: Ambulatory Visit | Attending: Neurosurgery | Admitting: Neurosurgery

## 2016-05-04 ENCOUNTER — Encounter (HOSPITAL_COMMUNITY)
Admission: RE | Disposition: A | Discharge status: Home / Self Care | Payer: Self-pay | Source: Ambulatory Visit | Attending: Neurosurgery

## 2016-05-04 ENCOUNTER — Encounter (HOSPITAL_COMMUNITY): Payer: Self-pay

## 2016-05-04 DIAGNOSIS — E119 Type 2 diabetes mellitus without complications: Secondary | ICD-10-CM | POA: Diagnosis not present

## 2016-05-04 DIAGNOSIS — J449 Chronic obstructive pulmonary disease, unspecified: Secondary | ICD-10-CM | POA: Diagnosis not present

## 2016-05-04 DIAGNOSIS — M50122 Cervical disc disorder at C5-C6 level with radiculopathy: Secondary | ICD-10-CM | POA: Diagnosis not present

## 2016-05-04 DIAGNOSIS — Z96653 Presence of artificial knee joint, bilateral: Secondary | ICD-10-CM | POA: Diagnosis not present

## 2016-05-04 DIAGNOSIS — Z7983 Long term (current) use of bisphosphonates: Secondary | ICD-10-CM | POA: Diagnosis not present

## 2016-05-04 DIAGNOSIS — Z7951 Long term (current) use of inhaled steroids: Secondary | ICD-10-CM | POA: Diagnosis not present

## 2016-05-04 DIAGNOSIS — Z87891 Personal history of nicotine dependence: Secondary | ICD-10-CM | POA: Diagnosis not present

## 2016-05-04 DIAGNOSIS — G809 Cerebral palsy, unspecified: Secondary | ICD-10-CM | POA: Diagnosis not present

## 2016-05-04 DIAGNOSIS — Z79899 Other long term (current) drug therapy: Secondary | ICD-10-CM | POA: Diagnosis not present

## 2016-05-04 DIAGNOSIS — Z7984 Long term (current) use of oral hypoglycemic drugs: Secondary | ICD-10-CM | POA: Diagnosis not present

## 2016-05-04 DIAGNOSIS — M4722 Other spondylosis with radiculopathy, cervical region: Secondary | ICD-10-CM | POA: Diagnosis not present

## 2016-05-04 DIAGNOSIS — M4802 Spinal stenosis, cervical region: Secondary | ICD-10-CM | POA: Diagnosis not present

## 2016-05-04 DIAGNOSIS — E220 Acromegaly and pituitary gigantism: Secondary | ICD-10-CM | POA: Diagnosis not present

## 2016-05-04 LAB — GLUCOSE, CAPILLARY: Glucose-Capillary: 132 mg/dL — ABNORMAL HIGH (ref 65–99)

## 2016-05-04 SURGERY — CANCELLED PROCEDURE
Anesthesia: General

## 2016-05-04 MED ORDER — PHENYLEPHRINE 40 MCG/ML (10ML) SYRINGE FOR IV PUSH (FOR BLOOD PRESSURE SUPPORT)
PREFILLED_SYRINGE | INTRAVENOUS | Status: AC
  Filled 2016-05-04: qty 10

## 2016-05-04 MED ORDER — MIDAZOLAM HCL 2 MG/2ML IJ SOLN
INTRAMUSCULAR | Status: AC
  Filled 2016-05-04: qty 2

## 2016-05-04 MED ORDER — LIDOCAINE 2% (20 MG/ML) 5 ML SYRINGE
INTRAMUSCULAR | Status: AC
  Filled 2016-05-04: qty 10

## 2016-05-04 MED ORDER — EPHEDRINE 5 MG/ML INJ
INTRAVENOUS | Status: AC
  Filled 2016-05-04: qty 10

## 2016-05-04 MED ORDER — FENTANYL CITRATE (PF) 250 MCG/5ML IJ SOLN
INTRAMUSCULAR | Status: AC
  Filled 2016-05-04: qty 5

## 2016-05-04 MED ORDER — PROPOFOL 10 MG/ML IV BOLUS
INTRAVENOUS | Status: AC
  Filled 2016-05-04: qty 20

## 2016-05-04 MED ORDER — LACTATED RINGERS IV SOLN
INTRAVENOUS | Status: DC
  Administered 2016-05-04: 12:00:00 via INTRAVENOUS

## 2016-05-04 NOTE — H&P (Signed)
[name] is a 60 year old woman who presents today for evaluation of pain that she has in her neck.  She describes the pain as burning and it has only gotten worse.  She has undergone injections to her neck without significant improvement.  The last injections were done five months ago.  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  She says that this burning pain has been very intense about three times in the last two months and that led her to finally seek medical attention.  However, she did have the MRI a year ago of her neck, so the timeline I am not sure is exactly as she thinks.     In her own words, she says lower back pain feels like muscle spasms but is not as painful and lasts one day, but has happened often.  Neck pain.  Numb, burning feeling in neck one day, the next day so painful when trying to move my head.  Injections would stop pain. I would still feel slight pain and burning feeling in neck occasionally.  Numbness and weakness in arms and hands.  Injections again.  Still feel numbness and pain in hands and arms at times.  Cramps in legs and feet along with numbness.  Really painful in my right knee from lifting myself to turn over in bed.  Urinary incontinence.  Bladder not emptying.  Bowel problems, may be loose or hard.  Vertigo and hallucinations.     PAST MEDICAL HISTORY:                                Past medical history also includes lung disease, colitis, diabetes.  The knee replacements occurred in 02/2006.               Medications and Allergies:  SHE HAS ALLERGIES TO SULFA MEDICATIONS AND TO MONISTAT.  BOTH CAUSE ITCHING AND BURNING.  Medications are as follows:  Myrbetriq, ProAir, Metformin, Lamotrigine, Spironolactone, Atorvastatin, Xenical, Vitamin D3,  Triamterine, Gelatin, Elavil, Vitamin C, Tizanidine, Melatonin, Fluticasone, Oxybutynin, Buspirone, and Alendronate.     PHYSICAL EXAMINATION:                                On very limited physical exam she has full strength in the upper extremities.  Reflexes are brisk, 2 to 3+ at the biceps and triceps.  She did have bilateral Hoffman sign.  Proprioception was intact bilaterally.  She has good adductors in the right lower extremity, very weak in the left lower extremity.  Hip flexors the same.     Pupils equal, round and reactive to light.  Full extraocular movements, full visual fields.  Hearing intact to voice.  Uvula elevates in the midline.  Shoulder shrug is normal.  Tongues protrudes in the midline.     I did not assess her gait.  Obviously, I did not assess her [place].     Vital signs:  Height 5 feet 6.5 inches, weight 227.3 pounds, temperature is 98 degrees Fahrenheit, blood pressure is 111/73, pulse is 95, respiratory rate is 14.  Pain is 6/10.    [name] returns today with an  MRI of the cervical spine and plain films.  She has a degenerated disc space and anterior and posterior osteophytes present at the C5-6 level.  In the radiology report, they speak about foraminal narrowing at 3-4, 4-5, 5-6, 6-7 on the left side; 5-6 is clearly the worst.  The others have marginally gotten worse but this one is certainly the worst.  She has some very mild canal stenosis, but is bad on both the right and left sides.  Looking at it, I think this is the most likely reason for her pain and even though she has nursed this 5-6 degenerated space for quite some time, I just do not think that it needs additional levels.  If she would need them it could be done, but I would just plan on doing the one level there.         IMPRESSION/PLAN:                             We had a long discussion about this.  She is due to get bariatric surgery in September.  Her BMI, though, is only 36.  I will see exactly  what is going on with Tawanna Solo, but for right now I would just again plan on a single level.  She is going to find out from her bariatric surgeon if this is contraindicated in any way until after surgery.  I doubt it, but she will find out nevertheless.

## 2016-05-04 NOTE — Progress Notes (Signed)
Report to Dr. Thomes Cake that pt. Has been on Xenical, never holding/ stopping before surgery. Last dose yesterday.

## 2016-05-04 NOTE — Progress Notes (Signed)
Report to Dr. Christella Noa that pt. Is having blood in her urine on a straight void, using the bedpan. Pt. Reports that she had been having the blood upon voiding but that it had stopped until today. Urine placed in cup & sent to Neuro OR holding with pt.

## 2016-05-04 NOTE — Anesthesia Preprocedure Evaluation (Signed)
Anesthesia Evaluation  Patient identified by MRN, date of birth, ID band Patient awake    Reviewed: Allergy & Precautions, NPO status , Patient's Chart, lab work & pertinent test results  Airway Mallampati: II  TM Distance: >3 FB Neck ROM: Full    Dental no notable dental hx.    Pulmonary neg pulmonary ROS, former smoker,    Pulmonary exam normal breath sounds clear to auscultation       Cardiovascular negative cardio ROS Normal cardiovascular exam Rhythm:Regular Rate:Normal     Neuro/Psych negative neurological ROS  negative psych ROS   GI/Hepatic negative GI ROS, Neg liver ROS,   Endo/Other  diabetes, Type 2, Oral Hypoglycemic Agents  Renal/GU negative Renal ROS  negative genitourinary   Musculoskeletal negative musculoskeletal ROS (+)   Abdominal   Peds negative pediatric ROS (+)  Hematology negative hematology ROS (+)   Anesthesia Other Findings   Reproductive/Obstetrics negative OB ROS                             Anesthesia Physical Anesthesia Plan  ASA: II  Anesthesia Plan: General   Post-op Pain Management:    Induction: Intravenous  Airway Management Planned: Oral ETT  Additional Equipment:   Intra-op Plan:   Post-operative Plan: Extubation in OR  Informed Consent: I have reviewed the patients History and Physical, chart, labs and discussed the procedure including the risks, benefits and alternatives for the proposed anesthesia with the patient or authorized representative who has indicated his/her understanding and acceptance.   Dental advisory given  Plan Discussed with: CRNA  Anesthesia Plan Comments:         Anesthesia Quick Evaluation

## 2016-05-11 ENCOUNTER — Other Ambulatory Visit: Payer: Self-pay | Admitting: Neurosurgery

## 2016-05-11 DIAGNOSIS — N3021 Other chronic cystitis with hematuria: Secondary | ICD-10-CM | POA: Diagnosis not present

## 2016-05-21 DIAGNOSIS — Z79899 Other long term (current) drug therapy: Secondary | ICD-10-CM | POA: Diagnosis not present

## 2016-05-21 DIAGNOSIS — K219 Gastro-esophageal reflux disease without esophagitis: Secondary | ICD-10-CM | POA: Diagnosis not present

## 2016-05-21 DIAGNOSIS — E119 Type 2 diabetes mellitus without complications: Secondary | ICD-10-CM | POA: Diagnosis not present

## 2016-05-21 DIAGNOSIS — M810 Age-related osteoporosis without current pathological fracture: Secondary | ICD-10-CM | POA: Diagnosis not present

## 2016-05-21 DIAGNOSIS — E782 Mixed hyperlipidemia: Secondary | ICD-10-CM | POA: Diagnosis not present

## 2016-05-23 ENCOUNTER — Encounter (HOSPITAL_COMMUNITY): Payer: Self-pay | Admitting: *Deleted

## 2016-05-23 NOTE — Progress Notes (Addendum)
Pt was here for a PAT on 05/02/16 and surgery was rescheduled due to pt having an UTI. I called pt for pre-op call and she verified that allergies, medical and surgical history has not changed. Meds have changed some but are updated. She denies any recent chest pain or sob. States UTI sx are gone, now on antibiotics for 3 months.  Pt is diabetic, last A1C was 6.9 on 05/02/16. Pt states fasting blood sugar has been between 109-125.

## 2016-05-24 ENCOUNTER — Encounter (HOSPITAL_COMMUNITY): Payer: Self-pay | Admitting: Anesthesiology

## 2016-05-24 MED ORDER — CEFAZOLIN SODIUM-DEXTROSE 2-4 GM/100ML-% IV SOLN
2.0000 g | INTRAVENOUS | Status: AC
  Administered 2016-05-25: 2 g via INTRAVENOUS

## 2016-05-25 ENCOUNTER — Encounter (HOSPITAL_COMMUNITY): Payer: Self-pay | Admitting: *Deleted

## 2016-05-25 ENCOUNTER — Ambulatory Visit (HOSPITAL_COMMUNITY): Payer: Medicare Other | Admitting: Anesthesiology

## 2016-05-25 ENCOUNTER — Encounter (HOSPITAL_COMMUNITY)
Admission: RE | Disposition: A | Discharge status: Home / Self Care | Payer: Self-pay | Source: Ambulatory Visit | Attending: Neurosurgery

## 2016-05-25 ENCOUNTER — Ambulatory Visit (HOSPITAL_COMMUNITY): Payer: Medicare Other

## 2016-05-25 ENCOUNTER — Ambulatory Visit (HOSPITAL_COMMUNITY)
Admission: RE | Admit: 2016-05-25 | Discharge: 2016-05-26 | Disposition: A | Discharge status: Home / Self Care | Payer: Medicare Other | Source: Ambulatory Visit | Attending: Neurosurgery | Admitting: Neurosurgery

## 2016-05-25 DIAGNOSIS — E220 Acromegaly and pituitary gigantism: Secondary | ICD-10-CM | POA: Diagnosis not present

## 2016-05-25 DIAGNOSIS — Z7984 Long term (current) use of oral hypoglycemic drugs: Secondary | ICD-10-CM | POA: Insufficient documentation

## 2016-05-25 DIAGNOSIS — M50122 Cervical disc disorder at C5-C6 level with radiculopathy: Secondary | ICD-10-CM | POA: Diagnosis not present

## 2016-05-25 DIAGNOSIS — M4722 Other spondylosis with radiculopathy, cervical region: Secondary | ICD-10-CM | POA: Diagnosis not present

## 2016-05-25 DIAGNOSIS — K219 Gastro-esophageal reflux disease without esophagitis: Secondary | ICD-10-CM | POA: Diagnosis not present

## 2016-05-25 DIAGNOSIS — J449 Chronic obstructive pulmonary disease, unspecified: Secondary | ICD-10-CM | POA: Insufficient documentation

## 2016-05-25 DIAGNOSIS — Z79899 Other long term (current) drug therapy: Secondary | ICD-10-CM | POA: Insufficient documentation

## 2016-05-25 DIAGNOSIS — M4322 Fusion of spine, cervical region: Secondary | ICD-10-CM | POA: Diagnosis not present

## 2016-05-25 DIAGNOSIS — E119 Type 2 diabetes mellitus without complications: Secondary | ICD-10-CM | POA: Diagnosis not present

## 2016-05-25 DIAGNOSIS — Z7983 Long term (current) use of bisphosphonates: Secondary | ICD-10-CM | POA: Insufficient documentation

## 2016-05-25 DIAGNOSIS — M4802 Spinal stenosis, cervical region: Secondary | ICD-10-CM | POA: Diagnosis not present

## 2016-05-25 DIAGNOSIS — Z87891 Personal history of nicotine dependence: Secondary | ICD-10-CM | POA: Insufficient documentation

## 2016-05-25 DIAGNOSIS — Z419 Encounter for procedure for purposes other than remedying health state, unspecified: Secondary | ICD-10-CM

## 2016-05-25 DIAGNOSIS — Z7951 Long term (current) use of inhaled steroids: Secondary | ICD-10-CM | POA: Insufficient documentation

## 2016-05-25 DIAGNOSIS — K589 Irritable bowel syndrome without diarrhea: Secondary | ICD-10-CM | POA: Diagnosis not present

## 2016-05-25 DIAGNOSIS — Z96653 Presence of artificial knee joint, bilateral: Secondary | ICD-10-CM | POA: Insufficient documentation

## 2016-05-25 DIAGNOSIS — G809 Cerebral palsy, unspecified: Secondary | ICD-10-CM | POA: Diagnosis not present

## 2016-05-25 HISTORY — PX: ANTERIOR CERVICAL DECOMP/DISCECTOMY FUSION: SHX1161

## 2016-05-25 LAB — GLUCOSE, CAPILLARY
GLUCOSE-CAPILLARY: 107 mg/dL — AB (ref 65–99)
GLUCOSE-CAPILLARY: 141 mg/dL — AB (ref 65–99)
GLUCOSE-CAPILLARY: 160 mg/dL — AB (ref 65–99)
GLUCOSE-CAPILLARY: 192 mg/dL — AB (ref 65–99)
Glucose-Capillary: 97 mg/dL (ref 65–99)

## 2016-05-25 LAB — CBC
HEMATOCRIT: 42.1 % (ref 36.0–46.0)
HEMOGLOBIN: 13.9 g/dL (ref 12.0–15.0)
MCH: 28 pg (ref 26.0–34.0)
MCHC: 33 g/dL (ref 30.0–36.0)
MCV: 84.9 fL (ref 78.0–100.0)
Platelets: 340 10*3/uL (ref 150–400)
RBC: 4.96 MIL/uL (ref 3.87–5.11)
RDW: 13.9 % (ref 11.5–15.5)
WBC: 11.1 10*3/uL — ABNORMAL HIGH (ref 4.0–10.5)

## 2016-05-25 LAB — BASIC METABOLIC PANEL
ANION GAP: 12 (ref 5–15)
BUN: 17 mg/dL (ref 6–20)
CO2: 21 mmol/L — AB (ref 22–32)
Calcium: 9.6 mg/dL (ref 8.9–10.3)
Chloride: 103 mmol/L (ref 101–111)
Creatinine, Ser: 0.74 mg/dL (ref 0.44–1.00)
GFR calc Af Amer: >60 mL/min (ref >60)
GFR calc non Af Amer: >60 mL/min (ref >60)
GLUCOSE: 101 mg/dL — AB (ref 65–99)
POTASSIUM: 3.6 mmol/L (ref 3.5–5.1)
Sodium: 136 mmol/L (ref 135–145)

## 2016-05-25 LAB — URINALYSIS, ROUTINE W REFLEX MICROSCOPIC
BILIRUBIN URINE: NEGATIVE
Glucose, UA: NEGATIVE mg/dL
HGB URINE DIPSTICK: NEGATIVE
KETONES UR: NEGATIVE mg/dL
Leukocytes, UA: NEGATIVE
NITRITE: NEGATIVE
Protein, ur: NEGATIVE mg/dL
SPECIFIC GRAVITY, URINE: 1.014 (ref 1.005–1.030)
pH: 6 (ref 5.0–8.0)

## 2016-05-25 SURGERY — ANTERIOR CERVICAL DECOMPRESSION/DISCECTOMY FUSION 1 LEVEL
Anesthesia: General

## 2016-05-25 MED ORDER — ONDANSETRON HCL 4 MG/2ML IJ SOLN
INTRAMUSCULAR | Status: AC
  Filled 2016-05-25: qty 2

## 2016-05-25 MED ORDER — FENTANYL CITRATE (PF) 250 MCG/5ML IJ SOLN
INTRAMUSCULAR | Status: AC
  Filled 2016-05-25: qty 5

## 2016-05-25 MED ORDER — CEFAZOLIN SODIUM-DEXTROSE 2-4 GM/100ML-% IV SOLN
INTRAVENOUS | Status: AC
  Filled 2016-05-25: qty 100

## 2016-05-25 MED ORDER — POTASSIUM CHLORIDE IN NACL 20-0.9 MEQ/L-% IV SOLN
INTRAVENOUS | Status: DC
  Administered 2016-05-25: 16:00:00 via INTRAVENOUS
  Filled 2016-05-25: qty 1000

## 2016-05-25 MED ORDER — SODIUM CHLORIDE 0.9 % IV SOLN: 250.0000 mL | INTRAVENOUS | Status: DC

## 2016-05-25 MED ORDER — LACTATED RINGERS IV SOLN
INTRAVENOUS | Status: DC | PRN
  Administered 2016-05-25 (×2): via INTRAVENOUS

## 2016-05-25 MED ORDER — SODIUM CHLORIDE 0.9% FLUSH
3.0000 mL | Freq: Two times a day (BID) | INTRAVENOUS | Status: DC
  Administered 2016-05-26: 3 mL via INTRAVENOUS

## 2016-05-25 MED ORDER — HEMOSTATIC AGENTS (NO CHARGE) OPTIME
TOPICAL | Status: DC | PRN
  Administered 2016-05-25: 1 via TOPICAL

## 2016-05-25 MED ORDER — CHLORHEXIDINE GLUCONATE CLOTH 2 % EX PADS: 6.0000 | MEDICATED_PAD | Freq: Once | CUTANEOUS | Status: DC

## 2016-05-25 MED ORDER — EPHEDRINE 5 MG/ML INJ
INTRAVENOUS | Status: AC
  Filled 2016-05-25: qty 10

## 2016-05-25 MED ORDER — FENTANYL CITRATE (PF) 100 MCG/2ML IJ SOLN
INTRAMUSCULAR | Status: DC | PRN
  Administered 2016-05-25: 50 ug via INTRAVENOUS
  Administered 2016-05-25: 150 ug via INTRAVENOUS
  Administered 2016-05-25 (×2): 50 ug via INTRAVENOUS
  Administered 2016-05-25: 100 ug via INTRAVENOUS

## 2016-05-25 MED ORDER — MIDAZOLAM HCL 2 MG/2ML IJ SOLN
INTRAMUSCULAR | Status: AC
  Filled 2016-05-25: qty 2

## 2016-05-25 MED ORDER — HYDROMORPHONE HCL 1 MG/ML IJ SOLN
0.2500 mg | INTRAMUSCULAR | Status: DC | PRN
  Administered 2016-05-25 (×3): 0.5 mg via INTRAVENOUS

## 2016-05-25 MED ORDER — DEXAMETHASONE SODIUM PHOSPHATE 10 MG/ML IJ SOLN
INTRAMUSCULAR | Status: DC | PRN
  Administered 2016-05-25: 10 mg via INTRAVENOUS

## 2016-05-25 MED ORDER — BUPIVACAINE-EPINEPHRINE 0.5% -1:200000 IJ SOLN
INTRAMUSCULAR | Status: DC | PRN
  Administered 2016-05-25: 7 mL

## 2016-05-25 MED ORDER — VITAMIN C 500 MG PO TABS
500.0000 mg | ORAL_TABLET | Freq: Every day | ORAL | Status: DC
  Administered 2016-05-25 – 2016-05-26 (×2): 500 mg via ORAL
  Filled 2016-05-25 (×2): qty 1

## 2016-05-25 MED ORDER — PROPOFOL 10 MG/ML IV BOLUS
INTRAVENOUS | Status: DC | PRN
  Administered 2016-05-25: 200 mg via INTRAVENOUS

## 2016-05-25 MED ORDER — MAGNESIUM OXIDE 400 (241.3 MG) MG PO TABS
200.0000 mg | ORAL_TABLET | Freq: Every day | ORAL | Status: DC
  Administered 2016-05-25 – 2016-05-26 (×2): 200 mg via ORAL
  Filled 2016-05-25 (×2): qty 1

## 2016-05-25 MED ORDER — ACETAMINOPHEN 650 MG RE SUPP: 650.0000 mg | RECTAL | Status: DC | PRN

## 2016-05-25 MED ORDER — MORPHINE SULFATE (PF) 2 MG/ML IV SOLN: 1.0000 mg | INTRAVENOUS | Status: DC | PRN

## 2016-05-25 MED ORDER — VITAMIN D 1000 UNITS PO TABS
1000.0000 [IU] | ORAL_TABLET | Freq: Every day | ORAL | Status: DC
  Administered 2016-05-25 – 2016-05-26 (×2): 1000 [IU] via ORAL
  Filled 2016-05-25 (×2): qty 1

## 2016-05-25 MED ORDER — AMITRIPTYLINE HCL 25 MG PO TABS
50.0000 mg | ORAL_TABLET | Freq: Every day | ORAL | Status: DC
Start: 2016-05-25 — End: 2016-05-26
  Administered 2016-05-25: 50 mg via ORAL
  Filled 2016-05-25: qty 2

## 2016-05-25 MED ORDER — TRIAMTERENE-HCTZ 75-50 MG PO TABS
1.0000 | ORAL_TABLET | Freq: Every day | ORAL | Status: DC
Start: 2016-05-25 — End: 2016-05-26
  Administered 2016-05-25 – 2016-05-26 (×2): 1 via ORAL
  Filled 2016-05-25 (×2): qty 1

## 2016-05-25 MED ORDER — LACTATED RINGERS IV SOLN
INTRAVENOUS | Status: DC
  Administered 2016-05-25: 07:00:00 via INTRAVENOUS

## 2016-05-25 MED ORDER — SODIUM CHLORIDE 0.9% FLUSH: 3.0000 mL | INTRAVENOUS | Status: DC | PRN

## 2016-05-25 MED ORDER — MENTHOL 3 MG MT LOZG
1.0000 | LOZENGE | OROMUCOSAL | Status: DC | PRN
Start: 2016-05-25 — End: 2016-05-26
  Administered 2016-05-25: 3 mg via ORAL
  Filled 2016-05-25: qty 9

## 2016-05-25 MED ORDER — PROMETHAZINE HCL 25 MG/ML IJ SOLN: 6.2500 mg | INTRAMUSCULAR | Status: DC | PRN

## 2016-05-25 MED ORDER — ONDANSETRON HCL 4 MG/2ML IJ SOLN: 4.0000 mg | INTRAMUSCULAR | Status: DC | PRN

## 2016-05-25 MED ORDER — ZOLPIDEM TARTRATE 5 MG PO TABS
5.0000 mg | ORAL_TABLET | Freq: Every evening | ORAL | Status: DC | PRN
Start: 2016-05-25 — End: 2016-05-26

## 2016-05-25 MED ORDER — THROMBIN 5000 UNITS EX SOLR
CUTANEOUS | Status: DC | PRN
  Administered 2016-05-25 (×2): 5000 [IU] via TOPICAL

## 2016-05-25 MED ORDER — TAMSULOSIN HCL 0.4 MG PO CAPS
0.4000 mg | ORAL_CAPSULE | Freq: Every day | ORAL | Status: DC
  Administered 2016-05-26: 0.4 mg via ORAL
  Filled 2016-05-25: qty 1

## 2016-05-25 MED ORDER — METFORMIN HCL 500 MG PO TABS
500.0000 mg | ORAL_TABLET | Freq: Every day | ORAL | Status: DC
  Administered 2016-05-26: 500 mg via ORAL
  Filled 2016-05-25 (×2): qty 1

## 2016-05-25 MED ORDER — HYDROMORPHONE HCL 1 MG/ML IJ SOLN
INTRAMUSCULAR | Status: AC
  Administered 2016-05-25: 0.5 mg via INTRAVENOUS
  Filled 2016-05-25: qty 1

## 2016-05-25 MED ORDER — ACETAMINOPHEN-CODEINE #3 300-30 MG PO TABS
1.0000 | ORAL_TABLET | ORAL | Status: DC | PRN
  Administered 2016-05-25 (×2): 2 via ORAL
  Filled 2016-05-25 (×2): qty 2

## 2016-05-25 MED ORDER — 0.9 % SODIUM CHLORIDE (POUR BTL) OPTIME
TOPICAL | Status: DC | PRN
  Administered 2016-05-25: 1000 mL

## 2016-05-25 MED ORDER — ROCURONIUM BROMIDE 50 MG/5ML IV SOLN
INTRAVENOUS | Status: AC
  Filled 2016-05-25: qty 2

## 2016-05-25 MED ORDER — ATORVASTATIN CALCIUM 40 MG PO TABS
40.0000 mg | ORAL_TABLET | Freq: Every day | ORAL | Status: DC
  Administered 2016-05-25: 40 mg via ORAL
  Filled 2016-05-25: qty 1

## 2016-05-25 MED ORDER — MAGNESIUM OXIDE 250 MG PO TABS: 250.0000 mg | ORAL_TABLET | Freq: Every day | ORAL | Status: DC

## 2016-05-25 MED ORDER — MELATONIN 3 MG PO TABS
12.0000 mg | ORAL_TABLET | Freq: Every day | ORAL | Status: DC
  Administered 2016-05-26: 12 mg via ORAL
  Filled 2016-05-25 (×2): qty 4

## 2016-05-25 MED ORDER — BUSPIRONE HCL 10 MG PO TABS
10.0000 mg | ORAL_TABLET | Freq: Two times a day (BID) | ORAL | Status: DC
  Administered 2016-05-25 – 2016-05-26 (×3): 10 mg via ORAL
  Filled 2016-05-25 (×3): qty 1

## 2016-05-25 MED ORDER — HYDROCODONE-ACETAMINOPHEN 5-325 MG PO TABS: 1.0000 | ORAL_TABLET | Freq: Four times a day (QID) | ORAL | 0 refills | Status: DC | PRN

## 2016-05-25 MED ORDER — ACETAMINOPHEN 325 MG PO TABS: 650.0000 mg | ORAL_TABLET | ORAL | Status: DC | PRN

## 2016-05-25 MED ORDER — SUGAMMADEX SODIUM 200 MG/2ML IV SOLN
INTRAVENOUS | Status: AC
  Filled 2016-05-25: qty 2

## 2016-05-25 MED ORDER — LIDOCAINE HCL (CARDIAC) 20 MG/ML IV SOLN
INTRAVENOUS | Status: DC | PRN
  Administered 2016-05-25: 50 mg via INTRAVENOUS

## 2016-05-25 MED ORDER — HYDROMORPHONE HCL 1 MG/ML IJ SOLN
INTRAMUSCULAR | Status: AC
  Filled 2016-05-25: qty 1

## 2016-05-25 MED ORDER — TRIMETHOPRIM 100 MG PO TABS
100.0000 mg | ORAL_TABLET | Freq: Every day | ORAL | Status: DC
Start: 2016-05-25 — End: 2016-05-26
  Administered 2016-05-26: 100 mg via ORAL
  Filled 2016-05-25 (×2): qty 1

## 2016-05-25 MED ORDER — ROCURONIUM BROMIDE 100 MG/10ML IV SOLN
INTRAVENOUS | Status: DC | PRN
  Administered 2016-05-25: 10 mg via INTRAVENOUS
  Administered 2016-05-25: 50 mg via INTRAVENOUS
  Administered 2016-05-25 (×2): 10 mg via INTRAVENOUS

## 2016-05-25 MED ORDER — GLYCOPYRROLATE 0.2 MG/ML IV SOSY
PREFILLED_SYRINGE | INTRAVENOUS | Status: AC
  Filled 2016-05-25: qty 3

## 2016-05-25 MED ORDER — CODEINE SULFATE 15 MG PO TABS
15.0000 mg | ORAL_TABLET | ORAL | Status: DC | PRN
Start: 2016-05-25 — End: 2016-05-26

## 2016-05-25 MED ORDER — FLUTICASONE PROPIONATE 50 MCG/ACT NA SUSP
2.0000 | Freq: Every day | NASAL | Status: DC
  Administered 2016-05-25: 2 via NASAL
  Filled 2016-05-25: qty 16

## 2016-05-25 MED ORDER — LAMOTRIGINE 100 MG PO TABS
150.0000 mg | ORAL_TABLET | Freq: Every day | ORAL | Status: DC
  Administered 2016-05-25 – 2016-05-26 (×2): 150 mg via ORAL
  Filled 2016-05-25 (×2): qty 2

## 2016-05-25 MED ORDER — ONDANSETRON HCL 4 MG/2ML IJ SOLN
INTRAMUSCULAR | Status: DC | PRN
  Administered 2016-05-25: 4 mg via INTRAVENOUS

## 2016-05-25 MED ORDER — MIRABEGRON ER 25 MG PO TB24
25.0000 mg | ORAL_TABLET | Freq: Every day | ORAL | Status: DC
  Administered 2016-05-25 – 2016-05-26 (×2): 25 mg via ORAL
  Filled 2016-05-25 (×2): qty 1

## 2016-05-25 MED ORDER — TIZANIDINE HCL 4 MG PO TABS: 4.0000 mg | ORAL_TABLET | Freq: Four times a day (QID) | ORAL | 0 refills | Status: DC | PRN

## 2016-05-25 MED ORDER — SUGAMMADEX SODIUM 200 MG/2ML IV SOLN
INTRAVENOUS | Status: DC | PRN
  Administered 2016-05-25: 191.4 mg via INTRAVENOUS

## 2016-05-25 MED ORDER — PROPOFOL 10 MG/ML IV BOLUS
INTRAVENOUS | Status: AC
  Filled 2016-05-25: qty 40

## 2016-05-25 MED ORDER — ALBUTEROL SULFATE (2.5 MG/3ML) 0.083% IN NEBU: 3.0000 mL | INHALATION_SOLUTION | Freq: Four times a day (QID) | RESPIRATORY_TRACT | Status: DC | PRN

## 2016-05-25 MED ORDER — PHENOL 1.4 % MT LIQD
1.0000 | OROMUCOSAL | Status: DC | PRN
Start: 2016-05-25 — End: 2016-05-26

## 2016-05-25 MED ORDER — SPIRONOLACTONE 25 MG PO TABS
100.0000 mg | ORAL_TABLET | Freq: Two times a day (BID) | ORAL | Status: DC
  Administered 2016-05-25 – 2016-05-26 (×3): 100 mg via ORAL
  Filled 2016-05-25 (×3): qty 4

## 2016-05-25 MED ORDER — TIZANIDINE HCL 4 MG PO TABS
4.0000 mg | ORAL_TABLET | Freq: Every day | ORAL | Status: DC
  Administered 2016-05-26: 4 mg via ORAL
  Filled 2016-05-25 (×2): qty 1

## 2016-05-25 MED ORDER — PHENYLEPHRINE 40 MCG/ML (10ML) SYRINGE FOR IV PUSH (FOR BLOOD PRESSURE SUPPORT)
PREFILLED_SYRINGE | INTRAVENOUS | Status: AC
  Filled 2016-05-25: qty 10

## 2016-05-25 SURGICAL SUPPLY — 77 items
ADH SKN CLS APL DERMABOND .7 (GAUZE/BANDAGES/DRESSINGS) ×1
BIT DRILL NEURO 2X3.1 SFT TUCH (MISCELLANEOUS) ×1 IMPLANT
BLADE CLIPPER SURG (BLADE) IMPLANT
BNDG GAUZE ELAST 4 BULKY (GAUZE/BANDAGES/DRESSINGS) IMPLANT
BUR DRUM 4.0 (BURR) IMPLANT
BUR DRUM 4.0MM (BURR)
CANISTER SUCT 3000ML PPV (MISCELLANEOUS) ×3 IMPLANT
DECANTER SPIKE VIAL GLASS SM (MISCELLANEOUS) ×3 IMPLANT
DERMABOND ADVANCED (GAUZE/BANDAGES/DRESSINGS) ×2
DERMABOND ADVANCED .7 DNX12 (GAUZE/BANDAGES/DRESSINGS) IMPLANT
DRAPE HALF SHEET 40X57 (DRAPES) IMPLANT
DRAPE LAPAROTOMY 100X72 PEDS (DRAPES) ×3 IMPLANT
DRAPE MICROSCOPE LEICA (MISCELLANEOUS) ×3 IMPLANT
DRAPE POUCH INSTRU U-SHP 10X18 (DRAPES) ×3 IMPLANT
DRILL NEURO 2X3.1 SOFT TOUCH (MISCELLANEOUS) ×3
DURAPREP 6ML APPLICATOR 50/CS (WOUND CARE) ×3 IMPLANT
ELECT COATED BLADE 2.86 ST (ELECTRODE) ×3 IMPLANT
ELECT REM PT RETURN 9FT ADLT (ELECTROSURGICAL) ×3
ELECTRODE REM PT RTRN 9FT ADLT (ELECTROSURGICAL) ×1 IMPLANT
GAUZE SPONGE 4X4 16PLY XRAY LF (GAUZE/BANDAGES/DRESSINGS) IMPLANT
GLOVE BIO SURGEON STRL SZ 6.5 (GLOVE) IMPLANT
GLOVE BIO SURGEON STRL SZ7 (GLOVE) IMPLANT
GLOVE BIO SURGEON STRL SZ7.5 (GLOVE) IMPLANT
GLOVE BIO SURGEON STRL SZ8 (GLOVE) IMPLANT
GLOVE BIO SURGEON STRL SZ8.5 (GLOVE) IMPLANT
GLOVE BIO SURGEONS STRL SZ 6.5 (GLOVE)
GLOVE BIOGEL M 8.0 STRL (GLOVE) IMPLANT
GLOVE BIOGEL PI IND STRL 6.5 (GLOVE) ×3 IMPLANT
GLOVE BIOGEL PI IND STRL 7.5 (GLOVE) ×1 IMPLANT
GLOVE BIOGEL PI INDICATOR 6.5 (GLOVE) ×6
GLOVE BIOGEL PI INDICATOR 7.5 (GLOVE) ×2
GLOVE ECLIPSE 6.5 STRL STRAW (GLOVE) ×3 IMPLANT
GLOVE ECLIPSE 7.0 STRL STRAW (GLOVE) IMPLANT
GLOVE ECLIPSE 7.5 STRL STRAW (GLOVE) IMPLANT
GLOVE ECLIPSE 8.0 STRL XLNG CF (GLOVE) IMPLANT
GLOVE ECLIPSE 8.5 STRL (GLOVE) IMPLANT
GLOVE EXAM NITRILE LRG STRL (GLOVE) IMPLANT
GLOVE EXAM NITRILE MD LF STRL (GLOVE) IMPLANT
GLOVE EXAM NITRILE XL STR (GLOVE) IMPLANT
GLOVE EXAM NITRILE XS STR PU (GLOVE) IMPLANT
GLOVE INDICATOR 6.5 STRL GRN (GLOVE) IMPLANT
GLOVE INDICATOR 7.0 STRL GRN (GLOVE) ×3 IMPLANT
GLOVE INDICATOR 7.5 STRL GRN (GLOVE) ×6 IMPLANT
GLOVE INDICATOR 8.0 STRL GRN (GLOVE) IMPLANT
GLOVE INDICATOR 8.5 STRL (GLOVE) IMPLANT
GLOVE OPTIFIT SS 8.0 STRL (GLOVE) IMPLANT
GLOVE SURG SS PI 6.5 STRL IVOR (GLOVE) IMPLANT
GLOVE SURG SS PI 7.0 STRL IVOR (GLOVE) ×3 IMPLANT
GOWN STRL REUS W/ TWL LRG LVL3 (GOWN DISPOSABLE) ×2 IMPLANT
GOWN STRL REUS W/ TWL XL LVL3 (GOWN DISPOSABLE) ×1 IMPLANT
GOWN STRL REUS W/TWL 2XL LVL3 (GOWN DISPOSABLE) IMPLANT
GOWN STRL REUS W/TWL LRG LVL3 (GOWN DISPOSABLE) ×6
GOWN STRL REUS W/TWL XL LVL3 (GOWN DISPOSABLE) ×3
KIT BASIN OR (CUSTOM PROCEDURE TRAY) ×3 IMPLANT
KIT ROOM TURNOVER OR (KITS) ×3 IMPLANT
LIQUID BAND (GAUZE/BANDAGES/DRESSINGS) ×3 IMPLANT
NDL HYPO 25X1 1.5 SAFETY (NEEDLE) ×1 IMPLANT
NDL SPNL 22GX3.5 QUINCKE BK (NEEDLE) ×1 IMPLANT
NEEDLE HYPO 25X1 1.5 SAFETY (NEEDLE) ×3 IMPLANT
NEEDLE SPNL 22GX3.5 QUINCKE BK (NEEDLE) ×6 IMPLANT
NS IRRIG 1000ML POUR BTL (IV SOLUTION) ×3 IMPLANT
PACK LAMINECTOMY NEURO (CUSTOM PROCEDURE TRAY) ×3 IMPLANT
PAD ARMBOARD 7.5X6 YLW CONV (MISCELLANEOUS) ×9 IMPLANT
PIN DISTRACTION 14MM (PIN) ×6 IMPLANT
PLATE HELIX R 22MM (Plate) ×3 IMPLANT
RUBBERBAND STERILE (MISCELLANEOUS) ×6 IMPLANT
SCREW 4.0X13 (Screw) ×4 IMPLANT
SCREW 4.0X13MM (Screw) ×8 IMPLANT
SPACER ACF PARALLEL 7MM (Bone Implant) ×3 IMPLANT
SPONGE INTESTINAL PEANUT (DISPOSABLE) ×3 IMPLANT
SPONGE SURGIFOAM ABS GEL SZ50 (HEMOSTASIS) ×3 IMPLANT
SUT VIC AB 0 CT1 27 (SUTURE) ×3
SUT VIC AB 0 CT1 27XBRD ANTBC (SUTURE) ×1 IMPLANT
SUT VIC AB 3-0 SH 8-18 (SUTURE) ×3 IMPLANT
TOWEL OR 17X24 6PK STRL BLUE (TOWEL DISPOSABLE) ×3 IMPLANT
TOWEL OR 17X26 10 PK STRL BLUE (TOWEL DISPOSABLE) ×3 IMPLANT
WATER STERILE IRR 1000ML POUR (IV SOLUTION) ×3 IMPLANT

## 2016-05-25 NOTE — Anesthesia Preprocedure Evaluation (Signed)
Anesthesia Evaluation  Patient identified by MRN, date of birth, ID band Patient awake    Reviewed: Allergy & Precautions, NPO status , Patient's Chart, lab work & pertinent test results  Airway Mallampati: II       Dental  (+) Teeth Intact, Caps   Pulmonary COPD, former smoker,    breath sounds clear to auscultation       Cardiovascular  Rhythm:Regular Rate:Normal     Neuro/Psych  Headaches,    GI/Hepatic GERD  ,  Endo/Other  diabetes  Renal/GU Renal InsufficiencyRenal disease     Musculoskeletal  (+) Arthritis ,   Abdominal   Peds  Hematology   Anesthesia Other Findings   Reproductive/Obstetrics                             Anesthesia Physical Anesthesia Plan  ASA: II  Anesthesia Plan: General   Post-op Pain Management:    Induction: Intravenous  Airway Management Planned: Oral ETT  Additional Equipment:   Intra-op Plan:   Post-operative Plan: Extubation in OR  Informed Consent: I have reviewed the patients History and Physical, chart, labs and discussed the procedure including the risks, benefits and alternatives for the proposed anesthesia with the patient or authorized representative who has indicated his/her understanding and acceptance.     Plan Discussed with: CRNA  Anesthesia Plan Comments:         Anesthesia Quick Evaluation

## 2016-05-25 NOTE — OR Nursing (Signed)
In and out catheter done in OR holding by Bryna Colander RN per verbal order of Dr Christella Noa

## 2016-05-25 NOTE — Transfer of Care (Signed)
Immediate Anesthesia Transfer of Care Note  Patient: GENIEVE SONNIER  Procedure(s) Performed: Procedure(s): Cervical five-six Anterior cervical decompression/diskectomy/fusion (N/A)  Patient Location: PACU  Anesthesia Type:General  Level of Consciousness: awake, alert  and oriented  Airway & Oxygen Therapy: Patient Spontanous Breathing and Patient connected to nasal cannula oxygen  Post-op Assessment: Report given to RN  Post vital signs: Reviewed and stable  Last Vitals:  Vitals:   05/25/16 1256 05/25/16 1259  BP:  (!) 83/65  Pulse: 84 91  Resp:  18  Temp: 37.2 C     Last Pain:  Vitals:   05/25/16 0634  TempSrc: Oral         Complications: No apparent anesthesia complications

## 2016-05-25 NOTE — Anesthesia Preprocedure Evaluation (Addendum)
Anesthesia Evaluation  Patient identified by MRN, date of birth, ID band Patient awake    Airway Mallampati: II  TM Distance: >3 FB Neck ROM: Full    Dental  (+) Teeth Intact, Dental Advisory Given   Pulmonary COPD,  COPD inhaler, former smoker,    breath sounds clear to auscultation       Cardiovascular  Rhythm:Regular Rate:Normal     Neuro/Psych  Headaches, Anxiety Depression Bipolar Disorder  Neuromuscular disease    GI/Hepatic GERD  Controlled,  Endo/Other  diabetes, Well Controlled, Type 2  Renal/GU      Musculoskeletal  (+) Arthritis ,   Abdominal   Peds  Hematology   Anesthesia Other Findings   Reproductive/Obstetrics                            Anesthesia Physical Anesthesia Plan  ASA: III  Anesthesia Plan: General   Post-op Pain Management:    Induction: Intravenous  Airway Management Planned: Oral ETT  Additional Equipment:   Intra-op Plan:   Post-operative Plan: Extubation in OR  Informed Consent: I have reviewed the patients History and Physical, chart, labs and discussed the procedure including the risks, benefits and alternatives for the proposed anesthesia with the patient or authorized representative who has indicated his/her understanding and acceptance.   Dental advisory given  Plan Discussed with: CRNA  Anesthesia Plan Comments:         Anesthesia Quick Evaluation

## 2016-05-25 NOTE — Anesthesia Postprocedure Evaluation (Signed)
Anesthesia Post Note  Patient: [name]  Procedure(s) Performed: Procedure(s) (LRB): Cervical five-six Anterior cervical decompression/diskectomy/fusion (N/A)  Patient location during evaluation: PACU Anesthesia Type: General Level of consciousness: awake, sedated and oriented Pain management: pain level controlled Vital Signs Assessment: post-procedure vital signs reviewed and stable Respiratory status: spontaneous breathing, nonlabored ventilation, respiratory function stable and patient connected to nasal cannula oxygen Cardiovascular status: blood pressure returned to baseline and stable Postop Assessment: no signs of nausea or vomiting Anesthetic complications: no    Last Vitals:  Vitals:   05/25/16 1338 05/25/16 1352  BP:    Pulse: 89 88  Resp: 10 14  Temp:      Last Pain:  Vitals:   05/25/16 1352  TempSrc:   PainSc: 8                  Donn Wilmot,JAMES TERRILL

## 2016-05-25 NOTE — Anesthesia Procedure Notes (Signed)
Date/Time: 05/25/2016 10:42 AM Performed by: Eligha Bridegroom Pre-anesthesia Checklist: Emergency Drugs available, Patient identified, Suction available and Patient being monitored Patient Re-evaluated:Patient Re-evaluated prior to inductionOxygen Delivery Method: Circle system utilized Preoxygenation: Pre-oxygenation with 100% oxygen Intubation Type: IV induction Ventilation: Mask ventilation without difficulty and Oral airway inserted - appropriate to patient size Grade View: Grade I Tube type: Oral Placement Confirmation: ETT inserted through vocal cords under direct vision and breath sounds checked- equal and bilateral Secured at: 21 cm Tube secured with: Tape Dental Injury: Teeth and Oropharynx as per pre-operative assessment

## 2016-05-25 NOTE — Discharge Summary (Signed)
Physician Discharge Summary  Patient ID: [name] MRN: [id number] DOB/AGE: 1956/10/17 60 y.o.  Admit date: 05/25/2016 Discharge date: 05/25/2016  Admission Diagnoses:osteoarthritis with radiculopathy cervical spine  Discharge Diagnoses:  Active Problems:   Osteoarthritis of spine with radiculopathy, cervical region   Discharged Condition: good  Hospital Course: [name] was admitted to the hospital and taken to the operating room for an uncomplicated ACDF at [id number]. Post op her neurologic exam remained stable. She has cerebral palsy and is week in both lower extremities. Her wound is clean, dry, and without signs of infection. Her voice is strong. She is moving her upper extremities at baseline.  She has voided, ambulated, and tolerated a regular diet at the time of discharge.   Treatments: surgery: ACDF C5/6, nuvasive hardware helix r 92mm structural allograft  Discharge Exam: Blood pressure 116/63, pulse (!) 105, temperature 98.1 F (36.7 C), temperature source Oral, resp. rate 20, height 5\' 7"  (1.702 m), weight 97.3 kg (214 lb 8.1 oz), SpO2 95 %. General appearance: alert, cooperative, appears stated age and no distress  Disposition: 01-Home or Self Care Osteoarthritis of spine with radiculopathy, Cervical region    Medication List    TAKE these medications   acetaminophen-codeine 300-30 MG tablet Commonly known as:  TYLENOL #3 Take 1 tablet by mouth 2 (two) times daily as needed for moderate pain.   alendronate 70 MG tablet Commonly known as:  FOSAMAX Take 70 mg by mouth every Saturday.   amitriptyline 50 MG tablet Commonly known as:  ELAVIL Take 1 tablet (50 mg total) by mouth at bedtime.   atorvastatin 40 MG tablet Commonly known as:  LIPITOR Take 40 mg by mouth daily.   busPIRone 10 MG tablet Commonly known as:  BUSPAR Take 1 tablet (10 mg total) by mouth 2 (two) times daily.   fluticasone 50 MCG/ACT nasal spray Commonly known as:  FLONASE Place 2  sprays into both nostrils daily.   HYDROcodone-acetaminophen 5-325 MG tablet Commonly known as:  NORCO/VICODIN Take 1 tablet by mouth every 6 (six) hours as needed for moderate pain.   lamoTRIgine 150 MG tablet Commonly known as:  LAMICTAL Take 1 tablet (150 mg total) by mouth daily.   Magnesium Oxide 250 MG Tabs Take 250 mg by mouth daily.   Melatonin 10 MG Tabs Take 13 mg by mouth at bedtime.   Melatonin 3 MG Tabs Take 13 mg by mouth at bedtime.   metFORMIN 500 MG tablet Commonly known as:  GLUCOPHAGE Take 500 mg by mouth daily with breakfast.   MYRBETRIQ 25 MG Tb24 tablet Generic drug:  mirabegron ER Take 25 mg by mouth daily.   PROAIR HFA 108 (90 Base) MCG/ACT inhaler Generic drug:  albuterol Inhale 1 puff daily as needed for wheezing or shortness of breath   RAPAFLO 8 MG Caps capsule Generic drug:  silodosin Take 8 mg by mouth daily with breakfast.   spironolactone 100 MG tablet Commonly known as:  ALDACTONE Take 100 mg by mouth 2 (two) times daily.   tamsulosin 0.4 MG Caps capsule Commonly known as:  FLOMAX Take 0.4 mg by mouth at bedtime.   tiZANidine 4 MG tablet Commonly known as:  ZANAFLEX Take 4 mg by mouth at bedtime. What changed:  Another medication with the same name was added. Make sure you understand how and when to take each.   tiZANidine 4 MG tablet Commonly known as:  ZANAFLEX Take 1 tablet (4 mg total) by mouth every 6 (six) hours as  needed for muscle spasms. What changed:  You were already taking a medication with the same name, and this prescription was added. Make sure you understand how and when to take each.   triamterene-hydrochlorothiazide 75-50 MG tablet Commonly known as:  MAXZIDE Take 1 tablet by mouth daily.   trimethoprim 100 MG tablet Commonly known as:  TRIMPEX Take 100 mg by mouth at bedtime.   Vitamin C 500 MG Caps Take 500 mg by mouth daily.   VITAMIN D-3 PO Take 1 tablet by mouth daily.   XENICAL 120 MG  capsule Generic drug:  orlistat TK 1 CAPSULE PO THREE TIMES DAILY      Follow-up Information    Gleb Mcguire L, MD .   Specialty:  Neurosurgery Why:  please call the office to make an appointment in 3 weeks Contact information: [place]. [address] [place] [address] [place] [address] [phone]           Signed: Winfield Cunas 05/25/2016, 8:08 PM

## 2016-05-25 NOTE — Discharge Instructions (Signed)

## 2016-05-25 NOTE — Op Note (Signed)
05/25/2016  12:49 PM  PATIENT:  [name]  60 y.o. female with cervical osteoarthritis, and canal compromise at C5/6. She has agreed to undergo operative decompression.   PRE-OPERATIVE DIAGNOSIS:  Osteoarthritis of spine with radiculopathy, Cervical region  POST-OPERATIVE DIAGNOSIS:  Osteoarthritis of spine with radiculopathy, Cervical region  PROCEDURE:  Anterior Cervical decompression C5/6 Arthrodesis C5/6 with 68mm structural allograft Anterior instrumentation(Nuvasive Helix r) C5/6  SURGEON:   Surgeon(s): Ashok Pall, MD Consuella Lose, MD   ASSISTANTS:Nundkumar, Neeles  ANESTHESIA:   general  EBL:  Total I/O In: 1000 [I.V.:1000] Out: 75 [Blood:75]  BLOOD ADMINISTERED:none  CELL SAVER GIVEN:none  COUNT:per nursing  DRAINS: none   SPECIMEN:  No Specimen  DICTATION: [name] was taken to the operating room, intubated, and placed under general anesthesia without difficulty. She was positioned supine with her head in slight extension on a horseshoe headrest. The neck was prepped and draped in a sterile manner. I infiltrated 7 cc's 1/2%lidocaine/1:200,000 strength epinephrine into the planned incision starting from the midline to the medial border of the left sternocleidomastoid muscle. I opened the incision with a 10 blade and dissected sharply through soft tissue to the platysma. I dissected in the plane superior to the platysma both rostrally and caudally. I then opened the platysma in a horizontal fashion with Metzenbaum scissors, and dissected in the inferior plane rostrally and caudally. With both blunt and sharp technique I created an avascular corridor to the cervical spine. I placed a spinal needle(s) in the disc space at 4/5, and C5/6 . I then reflected the longus colli from C5 to C6 and placed self retaining retractors. I opened the disc space(s) at 5/6 with a 15 blade. I removed disc with curettes, Kerrison punches, and the drill. Using the drill I removed  osteophytes and prepared for the decompression.  I decompressed the spinal canal and the C6 root(s) with the drill, Kerrison punches, and the curettes. I used the microscope to aid in microdissection. I removed the posterior longitudinal ligament to fully expose and decompress the thecal sac. I exposed the roots laterally taking down the 5/6 uncovertebral joints. With the decompression complete we moved on to the arthrodesis. We used the drill to level the surfaces of C5, and 6. I removed soft tissue to prepare the disc space and the bony surfaces. I measured the space and placed a 14mm structural allograft into the disc space.  We then placed the anterior instrumentation. I placed 2 screws in each vertebral body through the plate. I locked the screws into place. Intraoperative xray showed the graft, plate, and screws to be in good position. I irrigated the wound, achieved hemostasis, and closed the wound in layers. I approximated the platysma, and the subcuticular plane with vicryl sutures. I used Dermabond for a sterile dressing.   PLAN OF CARE: Admit for overnight observation  PATIENT DISPOSITION:  PACU - hemodynamically stable.   Delay start of Pharmacological VTE agent (>24hrs) due to surgical blood loss or risk of bleeding:  yes

## 2016-05-25 NOTE — Care Management Note (Addendum)
Case Management Note  Patient Details  Name: [name] MRN: [id number] Date of Birth: [birth year]  Subjective/Objective:   Pt underwent: Cervical five-six Anterior cervical decompression/diskectomy/fusion.   She is from home with her spouse.              Action/Plan: CM following for discharge needs.   Expected Discharge Date:                  Expected Discharge Plan:     In-House Referral:     Discharge planning Services     Post Acute Care Choice:    Choice offered to:     DME Arranged:    DME Agency:     HH Arranged:    HH Agency:     Status of Service:  In process, will continue to follow  If discussed at Long Length of Stay Meetings, dates discussed:    Additional Comments:  Pollie Friar, RN 05/25/2016, 2:59 PM

## 2016-05-25 NOTE — Transfer of Care (Signed)
Immediate Anesthesia Transfer of Care Note  Patient: [name]  Procedure(s) Performed: Procedure(s): Cervical five-six Anterior cervical decompression/diskectomy/fusion (N/A)  Patient Location: PACU  Anesthesia Type:General  Level of Consciousness: awake, alert  and oriented  Airway & Oxygen Therapy: Patient Spontanous Breathing and Patient connected to nasal cannula oxygen  Post-op Assessment: Report given to RN and Post -op Vital signs reviewed and stable  Post vital signs: Reviewed and stable  Last Vitals:  Vitals:   05/25/16 1307 05/25/16 1308  BP:  100/67  Pulse:  84  Resp: 15 15  Temp:      Last Pain:  Vitals:   05/25/16 1330  TempSrc:   PainSc: 10-Worst pain ever         Complications: No apparent anesthesia complications

## 2016-05-25 NOTE — Care Management Obs Status (Signed)
[facility] NOTIFICATION   Patient Details  Name: [name] MRN: [id number] Date of Birth: 03-06-1956   Medicare Observation Status Notification Given:  Yes    Pollie Friar, RN 05/25/2016, 4:00 PM

## 2016-05-25 NOTE — H&P (Signed)
BP 121/72   Pulse 85   Temp 98.7 F (37.1 C) (Oral)   Resp 18   Ht 5\' 7"  (1.702 m)   Wt 95.7 kg (211 lb)   SpO2 100%   BMI 33.05 kg/m  [name] is a 60 year old woman who presents today for evaluation of pain that she has in her neck.  She describes the pain as burning and it has only gotten worse.  She has undergone injections to her neck without significant improvement.  The last injections were done five months ago.  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  She says that this burning pain has been very intense about three times in the last two months and that led her to finally seek medical attention.  However, she did have the MRI a year ago of her neck, so the timeline I am not sure is exactly as she thinks.     In her own words, she says lower back pain feels like muscle spasms but is not as painful and lasts one day, but has happened often.  Neck pain.  Numb, burning feeling in neck one day, the next day so painful when trying to move my head.  Injections would stop pain. I would still feel slight pain and burning feeling in neck occasionally.  Numbness and weakness in arms and hands.  Injections again.  Still feel numbness and pain in hands and arms at times.  Cramps in legs and feet along with numbness.  Really painful in my right knee from lifting myself to turn over in bed.  Urinary incontinence.  Bladder not emptying.  Bowel problems, may be loose or hard.  Vertigo and hallucinations.     PAST MEDICAL HISTORY:                                Past medical history also includes lung disease, colitis, diabetes.  The knee replacements occurred in 02/2006.               Medications and Allergies:  SHE HAS ALLERGIES TO SULFA MEDICATIONS AND TO MONISTAT.  BOTH CAUSE ITCHING  AND BURNING.  Medications are as follows:  Myrbetriq, ProAir, Metformin, Lamotrigine, Spironolactone, Atorvastatin, Xenical, Vitamin D3, Triamterine, Gelatin, Elavil, Vitamin C, Tizanidine, Melatonin, Fluticasone, Oxybutynin, Buspirone, and Alendronate.     PHYSICAL EXAMINATION:                                On very limited physical exam she has full strength in the upper extremities.  Reflexes are brisk, 2 to 3+ at the biceps and triceps.  She did have bilateral Hoffman sign.  Proprioception was intact bilaterally.  She has good adductors in the right lower extremity, very weak in the left lower extremity.  Hip flexors the same.     Pupils equal, round and reactive to light.  Full extraocular movements, full visual fields.  Hearing intact to voice.  Uvula elevates in the midline.  Shoulder shrug is normal.  Tongues protrudes in the midline.     I did not assess her gait.  Obviously, I did not assess her Romberg.  Vital signs:  Height 5 feet 6.5 inches, weight 227.3 pounds, temperature is 98 degrees Fahrenheit, blood pressure is 111/73, pulse is 95, respiratory rate is 14.  Pain is 6/10.                                         plan   [name] returns today with an MRI of the cervical spine and plain films.  She has a degenerated disc space and anterior and posterior osteophytes present at the C5-6 level.  In the radiology report, they speak about foraminal narrowing at 3-4, 4-5, 5-6, 6-7 on the left side; 5-6 is clearly the worst.  The others have marginally gotten worse but this one is certainly the worst.  She has some very mild canal stenosis, but is bad on both the right and left sides.  Looking at it, I think this is the most likely reason for her pain and even though she has nursed this 5-6 degenerated space for quite some time, I just do not think that it needs additional levels.  If she would need them it could be done, but I would just plan on doing the one level there.   She  has decided to undergo an anterior cervical decompression and arthrodesis for osteoarthritis at levels C5/6. Risks and benefits including but not limited to bleeding, infection, paralysis, weakness in one or both extremities, bowel and/or bladder dysfunction, fusion failure, hardware failure, need for further surgery, no relief of pain. She understands and wishes to proceed.

## 2016-05-25 NOTE — Anesthesia Preprocedure Evaluation (Signed)
Anesthesia Evaluation    Airway Mallampati: II   Neck ROM: Full    Dental  (+) Teeth Intact, Caps,    Pulmonary former smoker,    breath sounds clear to auscultation       Cardiovascular  Rhythm:Regular     Neuro/Psych    GI/Hepatic   Endo/Other  diabetes  Renal/GU      Musculoskeletal   Abdominal (+) + obese,   Peds  Hematology   Anesthesia Other Findings   Reproductive/Obstetrics                             Anesthesia Physical Anesthesia Plan Anesthesia Quick Evaluation

## 2016-05-26 DIAGNOSIS — Z87891 Personal history of nicotine dependence: Secondary | ICD-10-CM | POA: Diagnosis not present

## 2016-05-26 DIAGNOSIS — Z7951 Long term (current) use of inhaled steroids: Secondary | ICD-10-CM | POA: Diagnosis not present

## 2016-05-26 DIAGNOSIS — Z7983 Long term (current) use of bisphosphonates: Secondary | ICD-10-CM | POA: Diagnosis not present

## 2016-05-26 DIAGNOSIS — Z96653 Presence of artificial knee joint, bilateral: Secondary | ICD-10-CM | POA: Diagnosis not present

## 2016-05-26 DIAGNOSIS — M4722 Other spondylosis with radiculopathy, cervical region: Secondary | ICD-10-CM | POA: Diagnosis not present

## 2016-05-26 DIAGNOSIS — M50122 Cervical disc disorder at C5-C6 level with radiculopathy: Secondary | ICD-10-CM | POA: Diagnosis not present

## 2016-05-26 DIAGNOSIS — J449 Chronic obstructive pulmonary disease, unspecified: Secondary | ICD-10-CM | POA: Diagnosis not present

## 2016-05-26 DIAGNOSIS — M4802 Spinal stenosis, cervical region: Secondary | ICD-10-CM | POA: Diagnosis not present

## 2016-05-26 DIAGNOSIS — Z79899 Other long term (current) drug therapy: Secondary | ICD-10-CM | POA: Diagnosis not present

## 2016-05-26 DIAGNOSIS — E220 Acromegaly and pituitary gigantism: Secondary | ICD-10-CM | POA: Diagnosis not present

## 2016-05-26 DIAGNOSIS — G809 Cerebral palsy, unspecified: Secondary | ICD-10-CM | POA: Diagnosis not present

## 2016-05-26 DIAGNOSIS — E119 Type 2 diabetes mellitus without complications: Secondary | ICD-10-CM | POA: Diagnosis not present

## 2016-05-26 DIAGNOSIS — Z7984 Long term (current) use of oral hypoglycemic drugs: Secondary | ICD-10-CM | POA: Diagnosis not present

## 2016-05-26 LAB — GLUCOSE, CAPILLARY
Glucose-Capillary: 209 mg/dL — ABNORMAL HIGH (ref 65–99)
Glucose-Capillary: 157 mg/dL — ABNORMAL HIGH (ref 65–99)

## 2016-05-26 NOTE — Progress Notes (Signed)
Discharge instructions reviewed with patient.  No questions at this time.

## 2016-05-26 NOTE — Progress Notes (Signed)
No acute events Ready for d/c

## 2016-05-28 ENCOUNTER — Encounter (HOSPITAL_COMMUNITY): Payer: Self-pay | Admitting: Neurosurgery

## 2016-06-11 ENCOUNTER — Ambulatory Visit (HOSPITAL_COMMUNITY): Payer: Self-pay | Admitting: Psychiatry

## 2016-06-19 DIAGNOSIS — M542 Cervicalgia: Secondary | ICD-10-CM | POA: Diagnosis not present

## 2016-06-21 ENCOUNTER — Encounter (HOSPITAL_COMMUNITY): Payer: Self-pay | Admitting: Psychiatry

## 2016-06-21 ENCOUNTER — Ambulatory Visit (INDEPENDENT_AMBULATORY_CARE_PROVIDER_SITE_OTHER): Payer: 59 | Admitting: Psychiatry

## 2016-06-21 DIAGNOSIS — F419 Anxiety disorder, unspecified: Secondary | ICD-10-CM

## 2016-06-21 DIAGNOSIS — F319 Bipolar disorder, unspecified: Secondary | ICD-10-CM

## 2016-06-21 MED ORDER — AMITRIPTYLINE HCL 50 MG PO TABS: 50.0000 mg | ORAL_TABLET | Freq: Every day | ORAL | 0 refills | Status: DC

## 2016-06-21 MED ORDER — BUSPIRONE HCL 10 MG PO TABS: 10.0000 mg | ORAL_TABLET | Freq: Two times a day (BID) | ORAL | 0 refills | Status: DC

## 2016-06-21 MED ORDER — LAMOTRIGINE 150 MG PO TABS: 150.0000 mg | ORAL_TABLET | Freq: Every day | ORAL | 0 refills | Status: DC

## 2016-06-21 NOTE — Progress Notes (Signed)
BH MD/PA/NP OP Progress Note  06/21/2016 1:21 PM [name]  MRN:  [id number]  Chief Complaint: routine med check Subjective:  No complaints HPI:  [name] said she was doing very well.  meds are working without apparent side effects.  Bladder issues remain and she is scheduled for a sonagram.  Depression is present but not intense and is manageable.her boyfriend is no longer a "boyfriend" but just a friend and they continue to live together because "I have nowhere else to go".  Sleep is good Visit Diagnosis:    ICD-9-CM ICD-10-CM   1. Bipolar 1 disorder (HCC) 296.7 F31.9 amitriptyline (ELAVIL) 50 MG tablet     busPIRone (BUSPAR) 10 MG tablet     lamoTRIgine (LAMICTAL) 150 MG tablet  2. Anxiety 300.00 F41.9 amitriptyline (ELAVIL) 50 MG tablet     busPIRone (BUSPAR) 10 MG tablet    Past Psychiatric History: no changes  Past Medical History:  Past Medical History:  Diagnosis Date  . Acromegaly ([place])   . Anxiety   . Arthritis   . Bacterial infection   . Benign paroxysmal positional vertigo 02/24/2013  . Bladder infection   . Cerebral palsy ([place])   . Colitis   . Depression    bi polar  . Diabetes mellitus, type II ([place])   . Dysplasia of cervix   . GERD (gastroesophageal reflux disease)   . Headache(784.0)   . High cholesterol   . History of chicken pox   . History of measles   . History of mumps   . IBS (irritable bowel syndrome)   . Kidney infection   . Melanoma in situ ([place])   . Neck mass   . Ovarian cyst   . Stenosis, cervical spine 05/16  . Trichomonas   . UTI (urinary tract infection)    current  . Yeast infection     Past Surgical History:  Procedure Laterality Date  . ANTERIOR CERVICAL DECOMP/DISCECTOMY FUSION N/A 05/25/2016   Procedure: Cervical five-six Anterior cervical decompression/diskectomy/fusion;  Surgeon: Ashok Pall, MD;  Location: [facility];  Service: Neurosurgery;  Laterality: N/A;  . APPENDECTOMY    . CARPAL TUNNEL RELEASE    . JOINT  REPLACEMENT    . knee rotation    . LEG TENDON SURGERY    . OVARIAN CYST REMOVAL    . REPLACEMENT TOTAL KNEE BILATERAL  2006  . rotator cuff surgery      Family Psychiatric History: no changes  Family History:  Family History  Problem Relation Age of Onset  . Suicidality Father   . Depression Father   . Alcohol abuse Father   . Emphysema Father     smoked  . Drug abuse Brother   . Alcohol abuse Brother   . Esophageal cancer Brother   . Depression Maternal Aunt   . Alcohol abuse Brother   . Anxiety disorder Daughter   . Depression Daughter   . Suicidality Daughter   . Alcohol abuse Daughter   . Drug abuse Son   . Alcohol abuse Son   . Emphysema Sister     smoked    Social History:  Social History   Social History  . Marital status: Divorced    Spouse name: N/A  . Number of children: N/A  . Years of education: graduate   Occupational History  . Disabled Disability   Social History Main Topics  . Smoking status: Former Smoker    Packs/day: 1.00    Years: 31.00  Types: Cigarettes    Quit date: 11/18/1999  . Smokeless tobacco: Never Used  . Alcohol use No  . Drug use: No  . Sexual activity: No   Other Topics Concern  . None   Social History Narrative   Right handed, Disabled. Live boyfriend , [name], Caffeine 3-4 cups.  Disabled.    Allergies:  Allergies  Allergen Reactions  . Miconazole Nitrate Itching  . Sulfonamide Derivatives Rash    Metabolic Disorder Labs: Lab Results  Component Value Date   HGBA1C 6.9 (H) 05/02/2016   MPG 151 05/02/2016   MPG 120 (H) 12/15/2015   No results found for: PROLACTIN Lab Results  Component Value Date   CHOL 186 04/09/2007   TRIG 160 (H) 04/09/2007   HDL 61.6 04/09/2007   CHOLHDL 3.0 CALC 04/09/2007   VLDL 32 04/09/2007   LDLCALC 92 04/09/2007     Current Medications: Current Outpatient Prescriptions  Medication Sig Dispense Refill  . acetaminophen-codeine (TYLENOL #3) 300-30 MG tablet  Take 1 tablet by mouth 2 (two) times daily as needed for moderate pain.     [name] alendronate (FOSAMAX) 70 MG tablet Take 70 mg by mouth every Saturday.     [name] amitriptyline (ELAVIL) 50 MG tablet Take 1 tablet (50 mg total) by mouth at bedtime. 90 tablet 0  . Ascorbic Acid (VITAMIN C) 500 MG CAPS Take 500 mg by mouth daily.     [name] atorvastatin (LIPITOR) 40 MG tablet Take 40 mg by mouth daily.    . busPIRone (BUSPAR) 10 MG tablet Take 1 tablet (10 mg total) by mouth 2 (two) times daily. 180 tablet 0  . Cholecalciferol (VITAMIN D-3 PO) Take 1 tablet by mouth daily.    . fluticasone (FLONASE) 50 MCG/ACT nasal spray Place 2 sprays into both nostrils daily.   1  . HYDROcodone-acetaminophen (NORCO/VICODIN) 5-325 MG tablet Take 1 tablet by mouth every 6 (six) hours as needed for moderate pain. 70 tablet 0  . lamoTRIgine (LAMICTAL) 150 MG tablet Take 1 tablet (150 mg total) by mouth daily. 90 tablet 0  . Magnesium Oxide 250 MG TABS Take 250 mg by mouth daily.     . Melatonin 10 MG TABS Take 13 mg by mouth at bedtime.     . Melatonin 3 MG TABS Take 13 mg by mouth at bedtime.     . metFORMIN (GLUCOPHAGE) 500 MG tablet Take 500 mg by mouth daily with breakfast.    . MYRBETRIQ 25 MG TB24 tablet Take 25 mg by mouth daily.  2  . PROAIR HFA 108 (90 BASE) MCG/ACT inhaler Inhale 1 puff daily as needed for wheezing or shortness of breath  11  . RAPAFLO 8 MG CAPS capsule Take 8 mg by mouth daily with breakfast.   11  . spironolactone (ALDACTONE) 100 MG tablet Take 100 mg by mouth 2 (two) times daily.   5  . tamsulosin (FLOMAX) 0.4 MG CAPS capsule Take 0.4 mg by mouth at bedtime.  11  . tiZANidine (ZANAFLEX) 4 MG tablet Take 4 mg by mouth at bedtime.     [name] tiZANidine (ZANAFLEX) 4 MG tablet Take 1 tablet (4 mg total) by mouth every 6 (six) hours as needed for muscle spasms. 60 tablet 0  . triamterene-hydrochlorothiazide (MAXZIDE) 75-50 MG tablet Take 1 tablet by mouth daily.  3  . trimethoprim (TRIMPEX) 100 MG tablet  Take 100 mg by mouth at bedtime.  3  . XENICAL 120 MG capsule TK 1 CAPSULE PO  THREE TIMES DAILY  5   No current facility-administered medications for this visit.     Neurologic: Headache: Negative Seizure: Negative Paresthesias: Negative  Musculoskeletal: Strength & Muscle Tone: within normal limits Gait & Station: normal Patient leans: N/A  Psychiatric Specialty Exam: ROS  Blood pressure 124/72, pulse 82, height 5\' 7"  (123XX123 m), weight 211 lb (95.7 kg).Body mass index is 33.05 kg/m.  General Appearance: Fairly Groomed  Eye Contact:  Good  Speech:  Clear and Coherent  Volume:  Normal  Mood:  Euthymic  Affect:  Appropriate  Thought Process:  Coherent  Orientation:  Full (Time, Place, and Person)  Thought Content: Logical   Suicidal Thoughts:  No  Homicidal Thoughts:  No  Memory:  Immediate;   Good Recent;   Good Remote;   Good  Judgement:  Good  Insight:  Good  Psychomotor Activity:  Normal  Concentration:  Concentration: Good and Attention Span: Good  Recall:  Good  Fund of Knowledge: Good  Language: Good  Akathisia:  Negative  Handed:  Right  AIMS (if indicated):  0  Assets:  Communication Skills Desire for Improvement Financial Resources/Insurance Housing Leisure Time Resilience Social Support Talents/Skills Transportation Vocational/Educational  ADL's:  Intact  Cognition: WNL  Sleep:  good     Treatment Plan Summary:Mood disorder:  Moods controlled.  Continue lamotrigine150 mg daily Anxiety:  Controlled:  Continue Buspirone10 mg twice daily and amitryptiline 50 mg hs Return in 3 months   Donnelly Angelica, MD 06/21/2016, 1:21 PM

## 2016-07-06 DIAGNOSIS — N3021 Other chronic cystitis with hematuria: Secondary | ICD-10-CM | POA: Diagnosis not present

## 2016-07-06 DIAGNOSIS — R310 Gross hematuria: Secondary | ICD-10-CM | POA: Diagnosis not present

## 2016-07-10 DIAGNOSIS — N281 Cyst of kidney, acquired: Secondary | ICD-10-CM | POA: Diagnosis not present

## 2016-07-10 DIAGNOSIS — N3021 Other chronic cystitis with hematuria: Secondary | ICD-10-CM | POA: Diagnosis not present

## 2016-07-19 DIAGNOSIS — M542 Cervicalgia: Secondary | ICD-10-CM | POA: Diagnosis not present

## 2016-07-19 DIAGNOSIS — M25511 Pain in right shoulder: Secondary | ICD-10-CM | POA: Diagnosis not present

## 2016-08-07 DIAGNOSIS — N3021 Other chronic cystitis with hematuria: Secondary | ICD-10-CM | POA: Diagnosis not present

## 2016-08-07 DIAGNOSIS — R3914 Feeling of incomplete bladder emptying: Secondary | ICD-10-CM | POA: Diagnosis not present

## 2016-08-14 DIAGNOSIS — M4722 Other spondylosis with radiculopathy, cervical region: Secondary | ICD-10-CM | POA: Diagnosis not present

## 2016-08-21 ENCOUNTER — Ambulatory Visit (INDEPENDENT_AMBULATORY_CARE_PROVIDER_SITE_OTHER): Payer: Medicare Other | Admitting: Orthopaedic Surgery

## 2016-08-24 ENCOUNTER — Emergency Department (HOSPITAL_COMMUNITY): Payer: Medicare Other

## 2016-08-24 ENCOUNTER — Encounter (HOSPITAL_COMMUNITY): Payer: Self-pay | Admitting: *Deleted

## 2016-08-24 ENCOUNTER — Emergency Department (HOSPITAL_COMMUNITY)
Admission: EM | Admit: 2016-08-24 | Discharge: 2016-08-24 | Disposition: A | Discharge status: Home / Self Care | Payer: Medicare Other | Attending: Emergency Medicine | Admitting: Emergency Medicine

## 2016-08-24 DIAGNOSIS — K0889 Other specified disorders of teeth and supporting structures: Secondary | ICD-10-CM

## 2016-08-24 DIAGNOSIS — Z87891 Personal history of nicotine dependence: Secondary | ICD-10-CM | POA: Insufficient documentation

## 2016-08-24 DIAGNOSIS — K047 Periapical abscess without sinus: Secondary | ICD-10-CM | POA: Diagnosis not present

## 2016-08-24 DIAGNOSIS — E119 Type 2 diabetes mellitus without complications: Secondary | ICD-10-CM | POA: Diagnosis not present

## 2016-08-24 DIAGNOSIS — G809 Cerebral palsy, unspecified: Secondary | ICD-10-CM | POA: Diagnosis not present

## 2016-08-24 DIAGNOSIS — Z96653 Presence of artificial knee joint, bilateral: Secondary | ICD-10-CM | POA: Diagnosis not present

## 2016-08-24 DIAGNOSIS — Z7984 Long term (current) use of oral hypoglycemic drugs: Secondary | ICD-10-CM | POA: Insufficient documentation

## 2016-08-24 DIAGNOSIS — R6884 Jaw pain: Secondary | ICD-10-CM | POA: Diagnosis not present

## 2016-08-24 LAB — I-STAT CHEM 8, ED
BUN: 15 mg/dL (ref 6–20)
CALCIUM ION: 1.04 mmol/L — AB (ref 1.15–1.40)
Chloride: 99 mmol/L — ABNORMAL LOW (ref 101–111)
Creatinine, Ser: 0.7 mg/dL (ref 0.44–1.00)
Glucose, Bld: 126 mg/dL — ABNORMAL HIGH (ref 65–99)
HEMATOCRIT: 47 % — AB (ref 36.0–46.0)
HEMOGLOBIN: 16 g/dL — AB (ref 12.0–15.0)
Potassium: 4.2 mmol/L (ref 3.5–5.1)
SODIUM: 133 mmol/L — AB (ref 135–145)
TCO2: 25 mmol/L (ref 0–100)

## 2016-08-24 MED ORDER — AMOXICILLIN-POT CLAVULANATE 875-125 MG PO TABS
1.0000 | ORAL_TABLET | Freq: Once | ORAL | Status: AC
  Administered 2016-08-24: 1 via ORAL
  Filled 2016-08-24: qty 1

## 2016-08-24 MED ORDER — IOPAMIDOL (ISOVUE-300) INJECTION 61%
INTRAVENOUS | Status: AC
  Administered 2016-08-24: 75 mL
  Filled 2016-08-24: qty 75

## 2016-08-24 MED ORDER — FENTANYL CITRATE (PF) 100 MCG/2ML IJ SOLN
100.0000 ug | Freq: Once | INTRAMUSCULAR | Status: AC
  Administered 2016-08-24: 100 ug via INTRAVENOUS
  Filled 2016-08-24: qty 2

## 2016-08-24 MED ORDER — HYDROCODONE-ACETAMINOPHEN 5-325 MG PO TABS: 1.0000 | ORAL_TABLET | Freq: Four times a day (QID) | ORAL | 0 refills | Status: DC | PRN

## 2016-08-24 MED ORDER — AMOXICILLIN-POT CLAVULANATE 875-125 MG PO TABS: 1.0000 | ORAL_TABLET | Freq: Two times a day (BID) | ORAL | 0 refills | Status: DC

## 2016-08-24 MED ORDER — HYDROCODONE-ACETAMINOPHEN 5-325 MG PO TABS
1.0000 | ORAL_TABLET | Freq: Once | ORAL | Status: AC
  Administered 2016-08-24: 1 via ORAL
  Filled 2016-08-24: qty 1

## 2016-08-24 NOTE — ED Triage Notes (Signed)
Pt states decaying R upper molar.  States when she woke up this am, the R side of her face was swollen.  ESI acuity 3 for med hx.

## 2016-08-24 NOTE — ED Notes (Signed)
This RN  attempted to obtain IV x2.

## 2016-08-24 NOTE — ED Provider Notes (Signed)
[facility] DEPT Provider Note   CSN: [id number] Arrival date & time: 08/24/16  1249     History   Chief Complaint Chief Complaint  Patient presents with  . Dental Pain    HPI [name] is a 60 y.o. female.  Patient is 60 yo F with PMH of diabetes, presenting with chief complaint of severe dental pain x 2 days. States she has "decaying" second molar on upper right side of her mouth. Describes pain as throbbing and constant, but denies fever, chills, difficulty swallowing, trismus, change in speech, drainage, or foul taste in mouth. Also reports mild facial swelling this morning, which "seems to have gotten better." Pain aggravated by chewing food on that side. She has not tried anything for relief, and does not have access to regular dentist. Denies prior dental infections or abscesses. States glucose has been well controlled taking daily Metformin, with CBG today of 135. Denies any tobacco or drug use.      Past Medical History:  Diagnosis Date  . Acromegaly ([place])   . Anxiety   . Arthritis   . Bacterial infection   . Benign paroxysmal positional vertigo 02/24/2013  . Bladder infection   . Cerebral palsy ([place])   . Colitis   . Depression    bi polar  . Diabetes mellitus, type II ([place])   . Dysplasia of cervix   . GERD (gastroesophageal reflux disease)   . Headache(784.0)   . High cholesterol   . History of chicken pox   . History of measles   . History of mumps   . IBS (irritable bowel syndrome)   . Kidney infection   . Melanoma in situ ([place])   . Neck mass   . Ovarian cyst   . Stenosis, cervical spine 05/16  . Trichomonas   . UTI (urinary tract infection)    current  . Yeast infection     Patient Active Problem List   Diagnosis Date Noted  . Osteoarthritis of spine with radiculopathy, cervical region 05/25/2016  . Postmenopausal bleeding 08/04/2015  . Acute respiratory failure ([place]) 07/19/2013  . Nocturnal hypoxemia 07/07/2013  . UTI (lower urinary  tract infection) 07/07/2013  . Hypokalemia 07/07/2013  . Abdominal pain, acute, right lower quadrant 07/07/2013  . Benign paroxysmal positional vertigo 02/24/2013  . Edema of both legs 02/24/2013  . Dysplasia of cervix   . Melanoma in situ ([place])   . Bipolar 1 disorder ([place]) 02/08/2012  . IBS 12/04/2007  . RASH AND OTHER NONSPECIFIC SKIN ERUPTION 12/04/2007  . EMPHYSEMA by CT only 09/05/2007  . NECK MASS 09/05/2007  . ACROMEGALY 08/13/2007  . Infantile cerebral palsy ([place]) 08/13/2007  . OTITIS EXTERNA, ACUTE 08/13/2007  . IRRITABLE BOWEL SYNDROME, HX OF 08/13/2007  . HYPERLIPIDEMIA 05/10/2007  . DEPRESSION 05/10/2007  . ALLERGIC RHINITIS 05/10/2007    Past Surgical History:  Procedure Laterality Date  . ANTERIOR CERVICAL DECOMP/DISCECTOMY FUSION N/A 05/25/2016   Procedure: Cervical five-six Anterior cervical decompression/diskectomy/fusion;  Surgeon: Ashok Pall, MD;  Location: [facility];  Service: Neurosurgery;  Laterality: N/A;  . APPENDECTOMY    . CARPAL TUNNEL RELEASE    . JOINT REPLACEMENT    . knee rotation    . LEG TENDON SURGERY    . OVARIAN CYST REMOVAL    . REPLACEMENT TOTAL KNEE BILATERAL  2006  . rotator cuff surgery      OB History    Gravida Para Term Preterm AB Living   3 2  2   SAB TAB Ectopic Multiple Live Births                   Home Medications    Prior to Admission medications   Medication Sig Start Date End Date Taking? Authorizing Provider  acetaminophen-codeine (TYLENOL #3) 300-30 MG tablet Take 1 tablet by mouth 2 (two) times daily as needed for moderate pain.     Historical Provider, MD  alendronate (FOSAMAX) 70 MG tablet Take 70 mg by mouth every Saturday.  06/13/14   Historical Provider, MD  amitriptyline (ELAVIL) 50 MG tablet Take 1 tablet (50 mg total) by mouth at bedtime. 06/21/16   Clarene Reamer, MD  Ascorbic Acid (VITAMIN C) 500 MG CAPS Take 500 mg by mouth daily.     Historical Provider, MD  atorvastatin (LIPITOR) 40 MG  tablet Take 40 mg by mouth daily.    Historical Provider, MD  busPIRone (BUSPAR) 10 MG tablet Take 1 tablet (10 mg total) by mouth 2 (two) times daily. 06/21/16   Clarene Reamer, MD  Cholecalciferol (VITAMIN D-3 PO) Take 1 tablet by mouth daily.    Historical Provider, MD  fluticasone (FLONASE) 50 MCG/ACT nasal spray Place 2 sprays into both nostrils daily.  03/29/16   Historical Provider, MD  HYDROcodone-acetaminophen (NORCO/VICODIN) 5-325 MG tablet Take 1 tablet by mouth every 6 (six) hours as needed for moderate pain. 05/25/16   Ashok Pall, MD  lamoTRIgine (LAMICTAL) 150 MG tablet Take 1 tablet (150 mg total) by mouth daily. 06/21/16   Clarene Reamer, MD  Magnesium Oxide 250 MG TABS Take 250 mg by mouth daily.     Historical Provider, MD  Melatonin 10 MG TABS Take 13 mg by mouth at bedtime.     Historical Provider, MD  Melatonin 3 MG TABS Take 13 mg by mouth at bedtime.     Historical Provider, MD  metFORMIN (GLUCOPHAGE) 500 MG tablet Take 500 mg by mouth daily with breakfast.    Historical Provider, MD  MYRBETRIQ 25 MG TB24 tablet Take 25 mg by mouth daily. 04/11/16   Historical Provider, MD  PROAIR HFA 108 (90 BASE) MCG/ACT inhaler Inhale 1 puff daily as needed for wheezing or shortness of breath 08/05/15   Historical Provider, MD  RAPAFLO 8 MG CAPS capsule Take 8 mg by mouth daily with breakfast.  04/11/16   Historical Provider, MD  spironolactone (ALDACTONE) 100 MG tablet Take 100 mg by mouth 2 (two) times daily.  01/28/15   Historical Provider, MD  tamsulosin (FLOMAX) 0.4 MG CAPS capsule Take 0.4 mg by mouth at bedtime. 03/06/16   Historical Provider, MD  tiZANidine (ZANAFLEX) 4 MG tablet Take 4 mg by mouth at bedtime.  06/30/14   Historical Provider, MD  tiZANidine (ZANAFLEX) 4 MG tablet Take 1 tablet (4 mg total) by mouth every 6 (six) hours as needed for muscle spasms. 05/25/16   Ashok Pall, MD  triamterene-hydrochlorothiazide (MAXZIDE) 75-50 MG tablet Take 1 tablet by mouth daily. 03/27/16    Historical Provider, MD  trimethoprim (TRIMPEX) 100 MG tablet Take 100 mg by mouth at bedtime. 05/11/16   Historical Provider, MD  XENICAL 120 MG capsule TK 1 CAPSULE PO THREE TIMES DAILY 07/25/15   Historical Provider, MD    Family History Family History  Problem Relation Age of Onset  . Suicidality Father   . Depression Father   . Alcohol abuse Father   . Emphysema Father     smoked  . Drug abuse  Brother   . Alcohol abuse Brother   . Esophageal cancer Brother   . Depression Maternal Aunt   . Alcohol abuse Brother   . Anxiety disorder Daughter   . Depression Daughter   . Suicidality Daughter   . Alcohol abuse Daughter   . Drug abuse Son   . Alcohol abuse Son   . Emphysema Sister     smoked    Social History Social History  Substance Use Topics  . Smoking status: Former Smoker    Packs/day: 1.00    Years: 31.00    Types: Cigarettes    Quit date: 11/18/1999  . Smokeless tobacco: Never Used  . Alcohol use No     Allergies   Miconazole nitrate and Sulfonamide derivatives   Review of Systems Review of Systems  Constitutional: Negative for chills and fever.  HENT: Positive for dental problem and facial swelling. Negative for drooling, sore throat, trouble swallowing and voice change.   Respiratory: Negative for cough and shortness of breath.   Cardiovascular: Negative for chest pain and palpitations.  Musculoskeletal: Negative for neck pain.  Neurological: Negative for headaches.     Physical Exam Updated Vital Signs BP 149/88   Pulse 84   Temp 97.8 F (36.6 C) (Oral)   Resp 16   Ht 5\' 7"  (1.702 m)   Wt 99.8 kg   SpO2 95%   BMI 34.46 kg/m   Physical Exam  Constitutional: She appears well-developed and well-nourished. No distress.  HENT:  Head: Normocephalic and atraumatic.  Mouth/Throat: Oropharynx is clear and moist.  TMs and ear canals normal bilaterally. No frontal or maxillary sinus tenderness. Uvula midline, no trismus. Poor dentition, with  decaying second molar noted on upper right side. No dental abscess or oral lesions, but hard palate exquisitely TTP on right side. No oropharyngeal exudate, erythema, or edema. No tonsillar abscess or exudate.  Eyes: Conjunctivae are normal.  Neck: Normal range of motion. Neck supple.  Cardiovascular: Normal rate.   Pulmonary/Chest: Effort normal. No respiratory distress.  Musculoskeletal: Normal range of motion.  Lymphadenopathy:    She has no cervical adenopathy.  Neurological: She is alert.  Skin: Skin is warm and dry.  Psychiatric: She has a normal mood and affect.  Nursing note and vitals reviewed.    ED Treatments / Results  Labs (all labs ordered are listed, but only abnormal results are displayed) Labs Reviewed - No data to display  EKG  EKG Interpretation None       Radiology No results found.  Procedures Procedures (including critical care time)  Medications Ordered in ED Medications - No data to display   Initial Impression / Assessment and Plan / ED Course  I have reviewed the triage vital signs and the nursing notes.  Pertinent labs & imaging results that were available during my care of the patient were reviewed by me and considered in my medical decision making (see chart for details).  Clinical Course   Patient is 60 yo F presenting with chief complaint of severe dental pain x 2 days. Decaying second molar noted on upper right side of her mouth, but no dental abscess, peritonsillar abscess, evidence of Ludwig's, or deep space infection. Given TTP of hard palate, CT maxillofacial with contrast ordered. CT scan shows abscess involving the right upper 2nd bicuspid, with suspected 10 mm odontogenic abscess along the right maxilla. Consult to oral surgery placed for outpatient management and f/u, but unfortunately they left for the day at 4:00 PM.  Started patient on Augmentin BID x 7 days (receiving first dose in ED), with short prescription of Norco for pain.  Advised patient to call Dr. Sheran Fava office for f/u with oral surgery, and resources for local dental clinics provided. Strict return precautions to ED discussed, and patient agreeable to f/u with Dr. Benson Norway.   Final Clinical Impressions(s) / ED Diagnoses   Final diagnoses:  Dental abscess  Pain, dental    New Prescriptions Discharge Medication List as of 08/24/2016  6:42 PM    START taking these medications   Details  amoxicillin-clavulanate (AUGMENTIN) 875-125 MG tablet Take 1 tablet by mouth 2 (two) times daily., Starting Fri 08/24/2016, Print         Kyston Gonce F de [place], [place] 08/25/16 [id number]    Merrily Pew, MD 08/27/16 [phone]

## 2016-08-24 NOTE — Discharge Instructions (Signed)
Please complete full course of antibiotics (Augmentin) as directed, and take Norco as needed for dental pain. Please call Dr. Raynelle Dick office to schedule appointment in the next 2 days for evaluation and management of dental abscess.  Also, listed below are resources to establish care with a local dentist.  RESOURCE GUIDE  Chronic Pain Problems: Contact [facility]  [phone] Patients need to be referred by their primary care doctor.  Insufficient Money for Medicine: Contact [employer]:  call [phone]  No Primary Care Doctor: Call [facility]  [phone] - can help you locate a primary care doctor that  accepts your insurance, provides certain services, etc. Physician Referral Service- [phone]  Agencies that provide inexpensive medical care: [facility]  [facility]  [phone] [facility]  [phone] [facility]  [phone] [facility]  [phone] [facility]  [phone]  [place] Providers: [facility]- [address]  [phone], Mon-Fri 9am-7pm, Sat 9am-1pm [place], [address] [place]  [place], [address] [place]  [place]- [address]  [place], [address], [phone]  Only accepts [place] patients after they have their name  applied to their card  Self Pay (no insurance) in [facility]: Sickle Cell Patients - [facility]  [place], [facility]- [facility] [place]- [phone] [address], [facility]- see information above (Speak to [employer] if you do not have insurance)       -  [facility]- [place],   [address]ie [address], [place]  Dr Vista Lawman-  [address], [place], [place], [place]       -  [facility] [employer] [address] [phone]       -  [facility]- [address], [place], also [address] [phone]       -     [facility]- [address], [place], 1st & 3rd Saturday         every month, 10am-1pm  -     [place]   [address] [place], [place].   Phone:  [phone], Fax:  [phone]. Hours of Operation:  9 am - 6 pm, M-F.  -     [facility]   [address], [place]   Phone: [phone], Fax: [phone]. Hours of Operation:  8:30 am - 5:30 pm, M-F.    Dental Assistance If unable to pay or uninsured, contact:  [facility]. to become qualified for the adult dental clinic.  Patients with Medicaid: [facility] [phone], [place] [address] [phone]  If unable to pay, or uninsured, contact [facility] [phone] in [place], [place] in [facility]) to become qualified for the adult dental clinic  [facility] [address] [place], [place] [address] [phone] [email or web address]  Other [place]: [facility] [place] [address], [place], [place], [address], [place], [address], 2nd and 4th Thursday of the month at 6:30am.  10 clients each day by appointment, can sometimes see walk-in patients if someone does not show for an appointment. [facility]- [address]anker [place], [place], [address], [place], [place], [place], [address], [place] Department- [phone] [facility] [phone]

## 2016-08-24 NOTE — ED Notes (Signed)
Delay with lab draw,  Nurse will get labs with IV.

## 2016-08-27 ENCOUNTER — Ambulatory Visit: Payer: Self-pay | Admitting: Gastroenterology

## 2016-08-27 ENCOUNTER — Other Ambulatory Visit (INDEPENDENT_AMBULATORY_CARE_PROVIDER_SITE_OTHER): Payer: Self-pay | Admitting: Orthopaedic Surgery

## 2016-09-04 ENCOUNTER — Ambulatory Visit (INDEPENDENT_AMBULATORY_CARE_PROVIDER_SITE_OTHER): Payer: Medicare Other | Admitting: Orthopaedic Surgery

## 2016-09-11 ENCOUNTER — Ambulatory Visit (INDEPENDENT_AMBULATORY_CARE_PROVIDER_SITE_OTHER): Payer: Medicare Other | Admitting: Orthopaedic Surgery

## 2016-09-11 ENCOUNTER — Encounter (INDEPENDENT_AMBULATORY_CARE_PROVIDER_SITE_OTHER): Payer: Self-pay | Admitting: Orthopaedic Surgery

## 2016-09-11 DIAGNOSIS — G8929 Other chronic pain: Secondary | ICD-10-CM | POA: Diagnosis not present

## 2016-09-11 DIAGNOSIS — M25511 Pain in right shoulder: Secondary | ICD-10-CM

## 2016-09-11 MED ORDER — CYCLOBENZAPRINE HCL 5 MG PO TABS: 5.0000 mg | ORAL_TABLET | Freq: Three times a day (TID) | ORAL | 3 refills | Status: DC | PRN

## 2016-09-11 NOTE — Progress Notes (Signed)
Office Visit Note   Patient: [name]           Date of Birth: January 28, 1956           MRN: [id number] Visit Date: 09/11/2016              Requested by: Leonides Sake, MD [place], [place] [address] PCP: Leonides Sake, MD   Assessment & Plan: Visit Diagnoses:  1. Chronic right shoulder pain     Plan: MRI right shoulder to rule out rotator cuff ordered. Follow-up after MRI.  Follow-Up Instructions: Return in about 2 weeks (around 09/25/2016) for review right shoulder MRI.   Orders:  Orders Placed This Encounter  Procedures  . MR Shoulder Right w/o contrast   Meds ordered this encounter  Medications  . DISCONTD: cyclobenzaprine (FLEXERIL) 5 MG tablet    Sig: Take 1-2 tablets (5-10 mg total) by mouth 3 (three) times daily as needed for muscle spasms.    Dispense:  30 tablet    Refill:  3  . cyclobenzaprine (FLEXERIL) 5 MG tablet    Sig: Take 1-2 tablets (5-10 mg total) by mouth 3 (three) times daily as needed for muscle spasms.    Dispense:  30 tablet    Refill:  3      Procedures: No procedures performed   Clinical Data: No additional findings.   Subjective: Chief Complaint  Patient presents with  . Right Shoulder - Pain, Follow-up    HPI Patient follows up for right shoulder injection on September 21. She has had partial relief from the injection. She still has some pain when doing the movement. She has had rotator cuff repair in the past. Review of Systems   Objective: Vital Signs: There were no vitals taken for this visit.  Physical Exam  Ortho Exam Exam of the right shoulder shows pain with rotator cuff testing. Positive impingement signs. Specialty Comments:  No specialty comments available.  Imaging: No results found.   PMFS History: Patient Active Problem List   Diagnosis Date Noted  . Osteoarthritis of spine with radiculopathy, cervical region 05/25/2016  . Postmenopausal bleeding 08/04/2015  . Acute  respiratory failure ([place]) 07/19/2013  . Nocturnal hypoxemia 07/07/2013  . UTI (lower urinary tract infection) 07/07/2013  . Hypokalemia 07/07/2013  . Abdominal pain, acute, right lower quadrant 07/07/2013  . Benign paroxysmal positional vertigo 02/24/2013  . Edema of both legs 02/24/2013  . Dysplasia of cervix   . Melanoma in situ ([place])   . Bipolar 1 disorder ([place]) 02/08/2012  . IBS 12/04/2007  . RASH AND OTHER NONSPECIFIC SKIN ERUPTION 12/04/2007  . EMPHYSEMA by CT only 09/05/2007  . NECK MASS 09/05/2007  . ACROMEGALY 08/13/2007  . Infantile cerebral palsy ([place]) 08/13/2007  . OTITIS EXTERNA, ACUTE 08/13/2007  . IRRITABLE BOWEL SYNDROME, HX OF 08/13/2007  . HYPERLIPIDEMIA 05/10/2007  . DEPRESSION 05/10/2007  . ALLERGIC RHINITIS 05/10/2007   Past Medical History:  Diagnosis Date  . Acromegaly ([place])   . Anxiety   . Arthritis   . Bacterial infection   . Benign paroxysmal positional vertigo 02/24/2013  . Bladder infection   . Cerebral palsy ([place])   . Colitis   . Depression    bi polar  . Diabetes mellitus, type II ([place])   . Dysplasia of cervix   . GERD (gastroesophageal reflux disease)   . Headache(784.0)   . High cholesterol   . History of chicken pox   . History of measles   .  History of mumps   . IBS (irritable bowel syndrome)   . Kidney infection   . Melanoma in situ ([place])   . Neck mass   . Ovarian cyst   . Stenosis, cervical spine 05/16  . Trichomonas   . UTI (urinary tract infection)    current  . Yeast infection     Family History  Problem Relation Age of Onset  . Suicidality Father   . Depression Father   . Alcohol abuse Father   . Emphysema Father     smoked  . Drug abuse Brother   . Alcohol abuse Brother   . Esophageal cancer Brother   . Depression Maternal Aunt   . Alcohol abuse Brother   . Anxiety disorder Daughter   . Depression Daughter   . Suicidality Daughter   . Alcohol abuse Daughter   . Drug abuse Son   . Alcohol abuse Son   .  Emphysema Sister     smoked    Past Surgical History:  Procedure Laterality Date  . ANTERIOR CERVICAL DECOMP/DISCECTOMY FUSION N/A 05/25/2016   Procedure: Cervical five-six Anterior cervical decompression/diskectomy/fusion;  Surgeon: Ashok Pall, MD;  Location: [facility];  Service: Neurosurgery;  Laterality: N/A;  . APPENDECTOMY    . CARPAL TUNNEL RELEASE    . JOINT REPLACEMENT    . knee rotation    . LEG TENDON SURGERY    . OVARIAN CYST REMOVAL    . REPLACEMENT TOTAL KNEE BILATERAL  2006  . rotator cuff surgery     Social History   Occupational History  . Disabled Disability   Social History Main Topics  . Smoking status: Former Smoker    Packs/day: 1.00    Years: 31.00    Types: Cigarettes    Quit date: 11/18/1999  . Smokeless tobacco: Never Used  . Alcohol use No  . Drug use: No  . Sexual activity: No

## 2016-09-16 ENCOUNTER — Other Ambulatory Visit (HOSPITAL_COMMUNITY): Payer: Self-pay | Admitting: Psychiatry

## 2016-09-16 DIAGNOSIS — F419 Anxiety disorder, unspecified: Secondary | ICD-10-CM

## 2016-09-16 DIAGNOSIS — F319 Bipolar disorder, unspecified: Secondary | ICD-10-CM

## 2016-09-17 ENCOUNTER — Other Ambulatory Visit (HOSPITAL_COMMUNITY): Payer: Self-pay | Admitting: Psychiatry

## 2016-09-17 DIAGNOSIS — F419 Anxiety disorder, unspecified: Secondary | ICD-10-CM

## 2016-09-17 DIAGNOSIS — F319 Bipolar disorder, unspecified: Secondary | ICD-10-CM

## 2016-09-17 MED ORDER — BUSPIRONE HCL 10 MG PO TABS: 10.0000 mg | ORAL_TABLET | Freq: Two times a day (BID) | ORAL | 0 refills | Status: DC

## 2016-09-24 ENCOUNTER — Ambulatory Visit (HOSPITAL_COMMUNITY): Payer: Self-pay | Admitting: Psychiatry

## 2016-09-25 ENCOUNTER — Ambulatory Visit (INDEPENDENT_AMBULATORY_CARE_PROVIDER_SITE_OTHER): Payer: Medicare Other | Admitting: Orthopaedic Surgery

## 2016-09-26 ENCOUNTER — Ambulatory Visit (INDEPENDENT_AMBULATORY_CARE_PROVIDER_SITE_OTHER): Payer: 59 | Admitting: Psychiatry

## 2016-09-26 ENCOUNTER — Ambulatory Visit
Admission: RE | Admit: 2016-09-26 | Discharge: 2016-09-26 | Disposition: A | Discharge status: Home / Self Care | Payer: Medicare Other | Source: Ambulatory Visit | Attending: Orthopaedic Surgery | Admitting: Orthopaedic Surgery

## 2016-09-26 ENCOUNTER — Encounter (HOSPITAL_COMMUNITY): Payer: Self-pay | Admitting: Psychiatry

## 2016-09-26 DIAGNOSIS — F319 Bipolar disorder, unspecified: Secondary | ICD-10-CM

## 2016-09-26 DIAGNOSIS — F419 Anxiety disorder, unspecified: Secondary | ICD-10-CM

## 2016-09-26 DIAGNOSIS — F909 Attention-deficit hyperactivity disorder, unspecified type: Secondary | ICD-10-CM

## 2016-09-26 DIAGNOSIS — G8929 Other chronic pain: Secondary | ICD-10-CM

## 2016-09-26 DIAGNOSIS — M25511 Pain in right shoulder: Principal | ICD-10-CM

## 2016-09-26 MED ORDER — AMITRIPTYLINE HCL 50 MG PO TABS: 50.0000 mg | ORAL_TABLET | Freq: Every day | ORAL | 0 refills | Status: DC

## 2016-09-26 MED ORDER — LAMOTRIGINE 150 MG PO TABS: 150.0000 mg | ORAL_TABLET | Freq: Every day | ORAL | 0 refills | Status: DC

## 2016-09-26 MED ORDER — BUSPIRONE HCL 10 MG PO TABS: 10.0000 mg | ORAL_TABLET | Freq: Two times a day (BID) | ORAL | 0 refills | Status: DC
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
## 2016-09-27 ENCOUNTER — Telehealth (INDEPENDENT_AMBULATORY_CARE_PROVIDER_SITE_OTHER): Payer: Self-pay | Admitting: Orthopaedic Surgery

## 2016-09-27 DIAGNOSIS — M25511 Pain in right shoulder: Principal | ICD-10-CM

## 2016-09-27 DIAGNOSIS — G8929 Other chronic pain: Secondary | ICD-10-CM

## 2016-09-27 NOTE — Telephone Encounter (Signed)
We can refer her to pain clinic to get her pain under control so that she can tolerate MRI.

## 2016-09-27 NOTE — Telephone Encounter (Signed)
[name] called saying she was unable to complete the MRI that was ordered for her due to being in extreme pain. She was told by [facility] that they'd call here at the [facility] location to schedule her appt.  Please give her a call if needed.  Pt's ph#

## 2016-09-27 NOTE — Telephone Encounter (Signed)
See message below °

## 2016-09-28 ENCOUNTER — Ambulatory Visit (INDEPENDENT_AMBULATORY_CARE_PROVIDER_SITE_OTHER): Payer: Medicare Other | Admitting: Orthopaedic Surgery

## 2016-10-02 NOTE — Telephone Encounter (Signed)
REFERRAL MADE TO PAIN CLINIC

## 2016-10-03 ENCOUNTER — Telehealth (INDEPENDENT_AMBULATORY_CARE_PROVIDER_SITE_OTHER): Payer: Self-pay | Admitting: Orthopaedic Surgery

## 2016-10-03 NOTE — Telephone Encounter (Signed)
Patient states she doesn't want to go to a pain clinic she wants medicine to help keep her calm during her MRI. Please give pt a call.

## 2016-10-03 NOTE — Telephone Encounter (Signed)
SEE MESSAGE

## 2016-10-03 NOTE — Telephone Encounter (Signed)
Valium 5-10 mg before MRI

## 2016-10-04 MED ORDER — DIAZEPAM 5 MG PO TABS: ORAL_TABLET | ORAL | 0 refills | Status: DC

## 2016-10-04 NOTE — Telephone Encounter (Signed)
How many would you like to prescribe?

## 2016-10-04 NOTE — Telephone Encounter (Signed)
Left message on machine rx sent to pharm

## 2016-10-04 NOTE — Telephone Encounter (Signed)
Called in Rx to pharm.

## 2016-10-04 NOTE — Telephone Encounter (Signed)
1-2 tabs po 30 mins before MRI

## 2016-10-04 NOTE — Addendum Note (Signed)
Addended by: Precious Bard on: 10/04/2016 01:23 PM   Modules accepted: Orders

## 2016-10-18 ENCOUNTER — Ambulatory Visit
Admission: RE | Admit: 2016-10-18 | Discharge: 2016-10-18 | Disposition: A | Discharge status: Home / Self Care | Payer: Medicare Other | Source: Ambulatory Visit | Attending: Orthopaedic Surgery | Admitting: Orthopaedic Surgery

## 2016-11-05 ENCOUNTER — Encounter (INDEPENDENT_AMBULATORY_CARE_PROVIDER_SITE_OTHER): Payer: Self-pay | Admitting: Orthopaedic Surgery

## 2016-11-05 ENCOUNTER — Ambulatory Visit (INDEPENDENT_AMBULATORY_CARE_PROVIDER_SITE_OTHER): Payer: Medicare Other | Admitting: Orthopaedic Surgery

## 2016-11-05 DIAGNOSIS — M25511 Pain in right shoulder: Secondary | ICD-10-CM

## 2016-11-05 DIAGNOSIS — G8929 Other chronic pain: Secondary | ICD-10-CM

## 2016-11-05 NOTE — Progress Notes (Signed)
Office Visit Note   Patient: [name]           Date of Birth: Mar 06, 1956           MRN: [id number] Visit Date: 11/05/2016              Requested by: Leonides Sake, MD [place], [place] [address] PCP: Leonides Sake, MD   Assessment & Plan: Visit Diagnoses: No diagnosis found.  Plan: MRI reviewed shows large full-thickness tear of supraspinatus. She does have retraction. Clinically speaking she is well compensated. I do think she has pain from her degenerative biceps and severe acromioclavicular joint arthropathy. I did discuss arthroscopic debridement biceps tenotomy with acromioplasty and distal clavicle excision and evaluation of the rotator cuff. I would expect that she would have pain improvement but would not have full pain relief. She understands her risks benefits alternatives to surgery she will think about this and let us know how she wishes to proceed.  Follow-Up Instructions: No Follow-up on file.   Orders:  No orders of the defined types were placed in this encounter.  No orders of the defined types were placed in this encounter.     Procedures: No procedures performed   Clinical Data: No additional findings.   Subjective: Chief Complaint  Patient presents with  . Right Shoulder - Pain    Patient follows up today for review her MRI of her right shoulder. 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 She is essentially a household ambulator    Review of Systems   Objective: Vital Signs: There were no vitals taken for this visit.  Physical Exam  Ortho Exam Exam of the right shoulder shows a well compensated right shoulder with moderately strong rotator cuff. She does have impingement signs. Biceps is tender. Positive O'Brien sign. Specialty Comments:  No specialty comments available.  Imaging: No results found.   PMFS History: Patient Active Problem List   Diagnosis Date  Noted  . Osteoarthritis of spine with radiculopathy, cervical region 05/25/2016  . Postmenopausal bleeding 08/04/2015  . Acute respiratory failure ([place]) 07/19/2013  . Nocturnal hypoxemia 07/07/2013  . UTI (lower urinary tract infection) 07/07/2013  . Hypokalemia 07/07/2013  . Abdominal pain, acute, right lower quadrant 07/07/2013  . Benign paroxysmal positional vertigo 02/24/2013  . Edema of both legs 02/24/2013  . Dysplasia of cervix   . Melanoma in situ ([place])   . Bipolar 1 disorder ([place]) 02/08/2012  . IBS 12/04/2007  . RASH AND OTHER NONSPECIFIC SKIN ERUPTION 12/04/2007  . EMPHYSEMA by CT only 09/05/2007  . NECK MASS 09/05/2007  . ACROMEGALY 08/13/2007  . Infantile cerebral palsy ([place]) 08/13/2007  . OTITIS EXTERNA, ACUTE 08/13/2007  . IRRITABLE BOWEL SYNDROME, HX OF 08/13/2007  . HYPERLIPIDEMIA 05/10/2007  . DEPRESSION 05/10/2007  . ALLERGIC RHINITIS 05/10/2007   Past Medical History:  Diagnosis Date  . Acromegaly ([place])   . Anxiety   . Arthritis   . Bacterial infection   . Benign paroxysmal positional vertigo 02/24/2013  . Bladder infection   . Cerebral palsy ([place])   . Colitis   . Depression    bi polar  . Diabetes mellitus, type II ([place])   . Dysplasia of cervix   . GERD (gastroesophageal reflux disease)   . Headache(784.0)   . High cholesterol   . History of chicken pox   . History of measles   . History of mumps   .  IBS (irritable bowel syndrome)   . Kidney infection   . Melanoma in situ ([place])   . Neck mass   . Ovarian cyst   . Stenosis, cervical spine 05/16  . Trichomonas   . UTI (urinary tract infection)    current  . Yeast infection     Family History  Problem Relation Age of Onset  . Suicidality Father   . Depression Father   . Alcohol abuse Father   . Emphysema Father     smoked  . Drug abuse Brother   . Alcohol abuse Brother   . Esophageal cancer Brother   . Depression Maternal Aunt   . Alcohol abuse Brother   . Anxiety disorder Daughter     . Depression Daughter   . Suicidality Daughter   . Alcohol abuse Daughter   . Drug abuse Son   . Alcohol abuse Son   . Emphysema Sister     smoked    Past Surgical History:  Procedure Laterality Date  . ANTERIOR CERVICAL DECOMP/DISCECTOMY FUSION N/A 05/25/2016   Procedure: Cervical five-six Anterior cervical decompression/diskectomy/fusion;  Surgeon: Ashok Pall, MD;  Location: [facility];  Service: Neurosurgery;  Laterality: N/A;  . APPENDECTOMY    . CARPAL TUNNEL RELEASE    . JOINT REPLACEMENT    . knee rotation    . LEG TENDON SURGERY    . OVARIAN CYST REMOVAL    . REPLACEMENT TOTAL KNEE BILATERAL  2006  . rotator cuff surgery     Social History   Occupational History  . Disabled Disability   Social History Main Topics  . Smoking status: Former Smoker    Packs/day: 1.00    Years: 31.00    Types: Cigarettes    Quit date: 11/18/1999  . Smokeless tobacco: Never Used  . Alcohol use No  . Drug use: No  . Sexual activity: No

## 2016-11-09 ENCOUNTER — Telehealth (INDEPENDENT_AMBULATORY_CARE_PROVIDER_SITE_OTHER): Payer: Self-pay | Admitting: *Deleted

## 2016-11-09 NOTE — Telephone Encounter (Signed)
See message below °

## 2016-11-09 NOTE — Telephone Encounter (Signed)
Pt calling stating she wished to have surgery rather than an injection. Requesting call back

## 2016-11-09 NOTE — Telephone Encounter (Signed)
Ok, please schedule her for right shoulder scope, debridement, DCE.  [facility] day preferably 11/22/15.  Lateral position, general and regional.  [id number], [id number].  Thanks.

## 2016-11-13 ENCOUNTER — Encounter: Payer: Self-pay | Admitting: Gastroenterology

## 2016-11-13 ENCOUNTER — Ambulatory Visit (INDEPENDENT_AMBULATORY_CARE_PROVIDER_SITE_OTHER): Payer: Medicare Other | Admitting: Gastroenterology

## 2016-11-13 VITALS — BP 112/70 | HR 100 | Ht 67.0 in | Wt 200.0 lb

## 2016-11-13 DIAGNOSIS — E220 Acromegaly and pituitary gigantism: Secondary | ICD-10-CM

## 2016-11-13 DIAGNOSIS — K625 Hemorrhage of anus and rectum: Secondary | ICD-10-CM | POA: Diagnosis not present

## 2016-11-13 MED ORDER — NA SULFATE-K SULFATE-MG SULF 17.5-3.13-1.6 GM/177ML PO SOLN: 1.0000 | Freq: Once | ORAL | 0 refills | Status: AC

## 2016-11-13 NOTE — Progress Notes (Signed)
Review of pertinent gastrointestinal problems: 1. Increased risk for colon and gastric cancer given underlying acromegaly:  Colonoscopy, EGD 04/2012 Dr. Ardis Hughs were both normal, recommended recalls at 5 years. 2. Patient concern for parasitic infection in GI tract: 2015 visit, ava and parasite testing negative, she was still very concerned.  Consultation with ID specialist at [facility] 12/2014"no indication for further lab testing or treatment at this time"    HPI: This is a  pleasant 61 year old woman  who was referred to me by Cyndi Bender (PA-C) to evaluate  overt blood in stools .    Chief complaint is blood in stools I last saw her about 3 years ago. That was for a separate issue. She was concerned she had parasites in her stool. This is for a new problem today.  Hb 07/2016 was normal.  She has seen blood in her stool 2 months ago.  During BM. No really constipated.  Small amount of red blood on the stool. Also same thing 2 months prior.  No constipation or diarrhea.  She says she has an oil with BMs.  Not with every BM.  No abdominal pains.  NO nauesa or vomiting.  No colon cancer in her family.  Overall she has lost weight; intentionally 10-12 pounds down.    Takes alleve daily, usually 2 per day for right shoulder injury, pain.     Review of systems: Pertinent positive and negative review of systems were noted in the above HPI section. Complete review of systems was performed and was otherwise normal.   Past Medical History:  Diagnosis Date  . Acromegaly ([place])   . Anxiety   . Arthritis   . Bacterial infection   . Benign paroxysmal positional vertigo 02/24/2013  . Bladder infection   . Cerebral palsy ([place])   . Colitis   . Depression    bi polar  . Diabetes mellitus, type II ([place])   . Dysplasia of cervix   . GERD (gastroesophageal reflux disease)   . Headache(784.0)   . High cholesterol   . History of chicken pox   . History of measles   . History of mumps   . IBS  (irritable bowel syndrome)   . Kidney infection   . Melanoma in situ ([place])   . Neck mass   . Ovarian cyst   . Stenosis, cervical spine 05/16  . Trichomonas   . UTI (urinary tract infection)    current  . Yeast infection     Past Surgical History:  Procedure Laterality Date  . ANTERIOR CERVICAL DECOMP/DISCECTOMY FUSION N/A 05/25/2016   Procedure: Cervical five-six Anterior cervical decompression/diskectomy/fusion;  Surgeon: Ashok Pall, MD;  Location: [facility];  Service: Neurosurgery;  Laterality: N/A;  . APPENDECTOMY    . CARPAL TUNNEL RELEASE    . JOINT REPLACEMENT    . knee rotation    . LEG TENDON SURGERY    . OVARIAN CYST REMOVAL    . REPLACEMENT TOTAL KNEE BILATERAL  2006  . rotator cuff surgery      Current Outpatient Prescriptions  Medication Sig Dispense Refill  . acetaminophen-codeine (TYLENOL #3) 300-30 MG tablet Take 1 tablet by mouth 2 (two) times daily as needed for moderate pain.     [name] alendronate (FOSAMAX) 70 MG tablet Take 70 mg by mouth every Saturday.     [name] amitriptyline (ELAVIL) 50 MG tablet Take 1 tablet (50 mg total) by mouth at bedtime. 90 tablet 0  . Ascorbic Acid (VITAMIN C) 500  MG CAPS Take 500 mg by mouth daily.     [name] atorvastatin (LIPITOR) 40 MG tablet Take 40 mg by mouth daily.    . busPIRone (BUSPAR) 10 MG tablet Take 1 tablet (10 mg total) by mouth 2 (two) times daily. 180 tablet 0  . Cholecalciferol (VITAMIN D-3 PO) Take 1 tablet by mouth daily.    . cyclobenzaprine (FLEXERIL) 5 MG tablet Take 1-2 tablets (5-10 mg total) by mouth 3 (three) times daily as needed for muscle spasms. 30 tablet 3  . diazepam (VALIUM) 5 MG tablet 1-2 tabs po 30 mins before MRI 2 tablet 0  . fluticasone (FLONASE) 50 MCG/ACT nasal spray Place 2 sprays into both nostrils daily.   1  . lamoTRIgine (LAMICTAL) 150 MG tablet Take 1 tablet (150 mg total) by mouth daily. 90 tablet 0  . Magnesium Oxide 250 MG TABS Take 250 mg by mouth daily.     . Melatonin 3 MG TABS Take 13  mg by mouth at bedtime.     . metFORMIN (GLUCOPHAGE) 500 MG tablet Take 500 mg by mouth daily with breakfast.    . MYRBETRIQ 25 MG TB24 tablet Take 1 tablet by mouth daily.    . nitrofurantoin (MACRODANTIN) 100 MG capsule Take 1 capsule by mouth daily.    [name] PROAIR HFA 108 (90 BASE) MCG/ACT inhaler Inhale 1 puff daily as needed for wheezing or shortness of breath  11  . ranitidine (ZANTAC) 300 MG tablet Take 300 mg by mouth daily.    [name] RAPAFLO 8 MG CAPS capsule Take 8 mg by mouth daily with breakfast.   11  . spironolactone (ALDACTONE) 100 MG tablet Take 100 mg by mouth 2 (two) times daily.   5  . tamsulosin (FLOMAX) 0.4 MG CAPS capsule Take 0.4 mg by mouth at bedtime.  11  . tiZANidine (ZANAFLEX) 4 MG tablet Take 1 tablet (4 mg total) by mouth every 6 (six) hours as needed for muscle spasms. 60 tablet 0  . traMADol (ULTRAM) 50 MG tablet Take 50 mg by mouth every 6 (six) hours as needed for pain.    [name] triamterene-hydrochlorothiazide (MAXZIDE) 75-50 MG tablet Take 1 tablet by mouth daily.  3  . trimethoprim (TRIMPEX) 100 MG tablet Take 100 mg by mouth at bedtime.  3  . XENICAL 120 MG capsule TK 1 CAPSULE PO THREE TIMES DAILY  5   No current facility-administered medications for this visit.     Allergies as of 11/13/2016 - Review Complete 11/13/2016  Allergen Reaction Noted  . Miconazole nitrate Itching 11/21/2011  . Sulfonamide derivatives Rash     Family History  Problem Relation Age of Onset  . Suicidality Father   . Depression Father   . Alcohol abuse Father   . Emphysema Father     smoked  . Drug abuse Brother   . Alcohol abuse Brother   . Esophageal cancer Brother   . Depression Maternal Aunt   . Alcohol abuse Brother   . Anxiety disorder Daughter   . Depression Daughter   . Suicidality Daughter   . Alcohol abuse Daughter   . Drug abuse Son   . Alcohol abuse Son   . Emphysema Sister     smoked    Social History   Social History  . Marital status: Divorced     Spouse name: N/A  . Number of children: N/A  . Years of education: graduate   Occupational History  . Disabled Disability   Social  History Main Topics  . Smoking status: Former Smoker    Packs/day: 1.00    Years: 31.00    Types: Cigarettes    Quit date: 11/18/1999  . Smokeless tobacco: Never Used  . Alcohol use No  . Drug use: No  . Sexual activity: No   Other Topics Concern  . Not on file   Social History Narrative   Right handed, Disabled. Live boyfriend , [name], Caffeine 3-4 cups.  Disabled.     Physical Exam: BP 112/70   Pulse 100   Ht 5\' 7"  (1.702 m)   Wt 200 lb (90.7 kg)   BMI 31.32 kg/m  Constitutional:Chronically ill-appearing, sits in a scooter to get around  Psychiatric: alert and oriented x3 Eyes: extraocular movements intact Mouth: oral pharynx moist, no lesions Neck: supple no lymphadenopathy Cardiovascular: heart regular rate and rhythm Lungs: clear to auscultation bilaterally Abdomen: soft, nontender, nondistended, no obvious ascites, no peritoneal signs, normal bowel sounds Extremities: no lower extremity edema bilaterally Skin: no lesions on visible extremities   Assessment and plan: 61 y.o. female with  minor rectal bleeding, also history of acromegaly she has a history of acromegaly which puts her at slightly increased risk for colon cancer and gastric cancer. Her last screening examinations were almost 5 years ago. She is due for screening this year. She has seen some very minor amount of rectal bleeding. Has no other GI symptoms. I suspect it is benign, anal rectal in origin. I recommended we proceed with colonoscopy now for that minor rectal bleeding and at the same time I will proceed with gastric cancer screening with upper endoscopy.  Owens Loffler, MD [facility] 11/13/2016, 10:03 AM  Cc: Cyndi Bender (PA-C)

## 2016-11-13 NOTE — Patient Instructions (Addendum)
You will be set up for a colonoscopy for rectal bleeding. You will be set up for an upper endoscopy for elevated risk of gastric cancer. These will be at [facility] with MAC sedation. Normal BMI (Body Mass Index- based on height and weight) is between 19 and 25. Your BMI today is Body mass index is 31.32 kg/m. [name] Please consider follow up  regarding your BMI with your Primary Care Provider.

## 2016-11-23 ENCOUNTER — Telehealth (INDEPENDENT_AMBULATORY_CARE_PROVIDER_SITE_OTHER): Payer: Self-pay

## 2016-11-23 DIAGNOSIS — M25511 Pain in right shoulder: Principal | ICD-10-CM

## 2016-11-23 DIAGNOSIS — G8929 Other chronic pain: Secondary | ICD-10-CM

## 2016-11-23 NOTE — Telephone Encounter (Signed)
In discussing surgery scheduling with patient, she questioned getting an injection instead.  Patient needs appointment for injection but I wasn't sure what injection would be ordered.  Patient needs call back.

## 2016-11-23 NOTE — Telephone Encounter (Signed)
What inj would you like for her to get?

## 2016-11-23 NOTE — Telephone Encounter (Signed)
Injection order made.

## 2016-11-23 NOTE — Telephone Encounter (Signed)
Shoulder injection with [facility]

## 2016-11-23 NOTE — Telephone Encounter (Signed)
Called pt to advise someone will be giving her a call to schedule ESI INJ.

## 2016-11-29 ENCOUNTER — Ambulatory Visit (HOSPITAL_COMMUNITY)
Admission: RE | Admit: 2016-11-29 | Discharge: 2016-11-29 | Disposition: A | Discharge status: Home / Self Care | Payer: Medicare Other | Source: Ambulatory Visit | Attending: Gastroenterology | Admitting: Gastroenterology

## 2016-11-29 ENCOUNTER — Ambulatory Visit (HOSPITAL_COMMUNITY): Payer: Medicare Other | Admitting: Anesthesiology

## 2016-11-29 ENCOUNTER — Encounter (HOSPITAL_COMMUNITY)
Admission: RE | Disposition: A | Discharge status: Home / Self Care | Payer: Self-pay | Source: Ambulatory Visit | Attending: Gastroenterology

## 2016-11-29 ENCOUNTER — Encounter (HOSPITAL_COMMUNITY): Payer: Self-pay

## 2016-11-29 DIAGNOSIS — G809 Cerebral palsy, unspecified: Secondary | ICD-10-CM | POA: Diagnosis not present

## 2016-11-29 DIAGNOSIS — Z79899 Other long term (current) drug therapy: Secondary | ICD-10-CM | POA: Insufficient documentation

## 2016-11-29 DIAGNOSIS — J449 Chronic obstructive pulmonary disease, unspecified: Secondary | ICD-10-CM | POA: Diagnosis not present

## 2016-11-29 DIAGNOSIS — Z96653 Presence of artificial knee joint, bilateral: Secondary | ICD-10-CM | POA: Diagnosis not present

## 2016-11-29 DIAGNOSIS — Z7984 Long term (current) use of oral hypoglycemic drugs: Secondary | ICD-10-CM | POA: Diagnosis not present

## 2016-11-29 DIAGNOSIS — E7800 Pure hypercholesterolemia, unspecified: Secondary | ICD-10-CM | POA: Diagnosis not present

## 2016-11-29 DIAGNOSIS — K644 Residual hemorrhoidal skin tags: Secondary | ICD-10-CM | POA: Diagnosis not present

## 2016-11-29 DIAGNOSIS — Z1212 Encounter for screening for malignant neoplasm of rectum: Secondary | ICD-10-CM | POA: Diagnosis not present

## 2016-11-29 DIAGNOSIS — F329 Major depressive disorder, single episode, unspecified: Secondary | ICD-10-CM | POA: Diagnosis not present

## 2016-11-29 DIAGNOSIS — E669 Obesity, unspecified: Secondary | ICD-10-CM | POA: Diagnosis not present

## 2016-11-29 DIAGNOSIS — F419 Anxiety disorder, unspecified: Secondary | ICD-10-CM | POA: Diagnosis not present

## 2016-11-29 DIAGNOSIS — Z85820 Personal history of malignant melanoma of skin: Secondary | ICD-10-CM | POA: Insufficient documentation

## 2016-11-29 DIAGNOSIS — Z1211 Encounter for screening for malignant neoplasm of colon: Secondary | ICD-10-CM

## 2016-11-29 DIAGNOSIS — H8110 Benign paroxysmal vertigo, unspecified ear: Secondary | ICD-10-CM | POA: Diagnosis not present

## 2016-11-29 DIAGNOSIS — E220 Acromegaly and pituitary gigantism: Secondary | ICD-10-CM | POA: Diagnosis not present

## 2016-11-29 DIAGNOSIS — K219 Gastro-esophageal reflux disease without esophagitis: Secondary | ICD-10-CM | POA: Diagnosis not present

## 2016-11-29 DIAGNOSIS — K625 Hemorrhage of anus and rectum: Secondary | ICD-10-CM | POA: Diagnosis not present

## 2016-11-29 DIAGNOSIS — Z120 Encounter for screening for malignant neoplasm of stomach: Secondary | ICD-10-CM

## 2016-11-29 DIAGNOSIS — Z7983 Long term (current) use of bisphosphonates: Secondary | ICD-10-CM | POA: Diagnosis not present

## 2016-11-29 DIAGNOSIS — Z7951 Long term (current) use of inhaled steroids: Secondary | ICD-10-CM | POA: Diagnosis not present

## 2016-11-29 DIAGNOSIS — Z6831 Body mass index (BMI) 31.0-31.9, adult: Secondary | ICD-10-CM | POA: Insufficient documentation

## 2016-11-29 DIAGNOSIS — K648 Other hemorrhoids: Secondary | ICD-10-CM | POA: Insufficient documentation

## 2016-11-29 DIAGNOSIS — Z87891 Personal history of nicotine dependence: Secondary | ICD-10-CM | POA: Insufficient documentation

## 2016-11-29 DIAGNOSIS — E119 Type 2 diabetes mellitus without complications: Secondary | ICD-10-CM | POA: Insufficient documentation

## 2016-11-29 HISTORY — PX: COLONOSCOPY WITH PROPOFOL: SHX5780

## 2016-11-29 HISTORY — PX: ESOPHAGOGASTRODUODENOSCOPY (EGD) WITH PROPOFOL: SHX5813

## 2016-11-29 SURGERY — ESOPHAGOGASTRODUODENOSCOPY (EGD) WITH PROPOFOL
Anesthesia: Monitor Anesthesia Care

## 2016-11-29 MED ORDER — LIDOCAINE 2% (20 MG/ML) 5 ML SYRINGE
INTRAMUSCULAR | Status: AC
  Filled 2016-11-29: qty 5

## 2016-11-29 MED ORDER — PROPOFOL 10 MG/ML IV BOLUS: INTRAVENOUS | Status: DC | PRN

## 2016-11-29 MED ORDER — PROPOFOL 500 MG/50ML IV EMUL
INTRAVENOUS | Status: DC | PRN
  Administered 2016-11-29: 125 ug/kg/min via INTRAVENOUS

## 2016-11-29 MED ORDER — LIDOCAINE 2% (20 MG/ML) 5 ML SYRINGE
INTRAMUSCULAR | Status: DC | PRN
  Administered 2016-11-29: 40 mg via INTRAVENOUS

## 2016-11-29 MED ORDER — PROPOFOL 10 MG/ML IV BOLUS
INTRAVENOUS | Status: AC
  Filled 2016-11-29: qty 60

## 2016-11-29 MED ORDER — PROPOFOL 10 MG/ML IV BOLUS
INTRAVENOUS | Status: DC | PRN
  Administered 2016-11-29 (×3): 20 mg via INTRAVENOUS

## 2016-11-29 MED ORDER — LACTATED RINGERS IV SOLN
INTRAVENOUS | Status: DC | PRN
  Administered 2016-11-29: 12:00:00 via INTRAVENOUS

## 2016-11-29 SURGICAL SUPPLY — 25 items

## 2016-11-29 NOTE — Interval H&P Note (Signed)
History and Physical Interval Note:  11/29/2016 11:53 AM  [name]  has presented today for surgery, with the diagnosis of rectal bleeding, screening for gastric cancer  The various methods of treatment have been discussed with the patient and family. After consideration of risks, benefits and other options for treatment, the patient has consented to  Procedure(s): ESOPHAGOGASTRODUODENOSCOPY (EGD) WITH PROPOFOL (N/A) COLONOSCOPY WITH PROPOFOL (N/A) as a surgical intervention .  The patient's history has been reviewed, patient examined, no change in status, stable for surgery.  I have reviewed the patient's chart and labs.  Questions were answered to the patient's satisfaction.     [name]

## 2016-11-29 NOTE — Op Note (Signed)
[facility] Patient Name: [name] Procedure Date: 11/29/2016 MRN: [id number] Attending MD: [name] , MD Date of Birth: [birth year] CSN: [id number] Age: 61 Admit Type: Outpatient Procedure:                Upper GI endoscopy Indications:              Screening procedure; at elevated risk for gastric                            cancer given Acromegaly. EGD 2013 was normal. Providers:                [name], MD, Laverta Baltimore RN, RN,                            Elspeth Cho Tech., Technician, Dione Booze,                            CRNA Referring MD:              Medicines:                Monitored Anesthesia Care Complications:            No immediate complications. Estimated blood loss:                            None. Estimated Blood Loss:     Estimated blood loss: none. Procedure:                Pre-Anesthesia Assessment:                           - Prior to the procedure, a History and Physical                            was performed, and patient medications and                            allergies were reviewed. The patient's tolerance of                            previous anesthesia was also reviewed. The risks                            and benefits of the procedure and the sedation                            options and risks were discussed with the patient.                            All questions were answered, and informed consent                            was obtained. Prior Anticoagulants: The patient has  taken no previous anticoagulant or antiplatelet                            agents. ASA Grade Assessment: II - A patient with                            mild systemic disease. After reviewing the risks                            and benefits, the patient was deemed in                            satisfactory condition to undergo the procedure.                           After obtaining informed  consent, the endoscope was                            passed under direct vision. Throughout the                            procedure, the patient's blood pressure, pulse, and                            oxygen saturations were monitored continuously. The                            [id number]) scope was introduced through the                            mouth, and advanced to the second part of duodenum.                            The upper GI endoscopy was accomplished without                            difficulty. The patient tolerated the procedure                            well. Scope In: Scope Out: Findings:      The esophagus was normal.      The stomach was normal.      The examined duodenum was normal. Impression:               - Normal esophagus.                           - Normal stomach.                           - Normal examined duodenum.                           - No polyps or cancers Moderate Sedation:      N/A- Per Anesthesia Care Recommendation:           -  Patient has a contact number available for                            emergencies. The signs and symptoms of potential                            delayed complications were discussed with the                            patient. Return to normal activities tomorrow.                            Written discharge instructions were provided to the                            patient.                           - Resume previous diet.                           - Continue present medications.                           - Repeat upper endoscopy in 5 years for screening                            purposes. Procedure Code(s):        --- Professional ---                           [phone], Esophagogastroduodenoscopy, flexible,                            transoral; diagnostic, including collection of                            specimen(s) by brushing or washing, when performed                            (separate  procedure) Diagnosis Code(s):        --- Professional ---                           Z13.810, Encounter for screening for upper                            gastrointestinal disorder CPT copyright 2016 [employer] Association. All rights reserved. The codes documented in this report are preliminary and upon coder review may  be revised to meet current compliance requirements. [name], MD 11/29/2016 12:49:22 PM This report has been signed electronically. Number of Addenda: 0

## 2016-11-29 NOTE — H&P (View-Only) (Signed)
Review of pertinent gastrointestinal problems: 1. Increased risk for colon and gastric cancer given underlying acromegaly:  Colonoscopy, EGD 04/2012 Dr. Ardis Hughs were both normal, recommended recalls at 5 years. 2. Patient concern for parasitic infection in GI tract: 2015 visit, ava and parasite testing negative, she was still very concerned.  Consultation with ID specialist at [facility] 12/2014"no indication for further lab testing or treatment at this time"    HPI: This is a  pleasant 61 year old woman  who was referred to me by Cyndi Bender (PA-C) to evaluate  overt blood in stools .    Chief complaint is blood in stools I last saw her about 3 years ago. That was for a separate issue. She was concerned she had parasites in her stool. This is for a new problem today.  Hb 07/2016 was normal.  She has seen blood in her stool 2 months ago.  During BM. No really constipated.  Small amount of red blood on the stool. Also same thing 2 months prior.  No constipation or diarrhea.  She says she has an oil with BMs.  Not with every BM.  No abdominal pains.  NO nauesa or vomiting.  No colon cancer in her family.  Overall she has lost weight; intentionally 10-12 pounds down.    Takes alleve daily, usually 2 per day for right shoulder injury, pain.     Review of systems: Pertinent positive and negative review of systems were noted in the above HPI section. Complete review of systems was performed and was otherwise normal.   Past Medical History:  Diagnosis Date  . Acromegaly ([place])   . Anxiety   . Arthritis   . Bacterial infection   . Benign paroxysmal positional vertigo 02/24/2013  . Bladder infection   . Cerebral palsy ([place])   . Colitis   . Depression    bi polar  . Diabetes mellitus, type II ([place])   . Dysplasia of cervix   . GERD (gastroesophageal reflux disease)   . Headache(784.0)   . High cholesterol   . History of chicken pox   . History of measles   . History of mumps   . IBS  (irritable bowel syndrome)   . Kidney infection   . Melanoma in situ ([place])   . Neck mass   . Ovarian cyst   . Stenosis, cervical spine 05/16  . Trichomonas   . UTI (urinary tract infection)    current  . Yeast infection     Past Surgical History:  Procedure Laterality Date  . ANTERIOR CERVICAL DECOMP/DISCECTOMY FUSION N/A 05/25/2016   Procedure: Cervical five-six Anterior cervical decompression/diskectomy/fusion;  Surgeon: Ashok Pall, MD;  Location: [place] [facility];  Service: Neurosurgery;  Laterality: N/A;  . APPENDECTOMY    . CARPAL TUNNEL RELEASE    . JOINT REPLACEMENT    . knee rotation    . LEG TENDON SURGERY    . OVARIAN CYST REMOVAL    . REPLACEMENT TOTAL KNEE BILATERAL  2006  . rotator cuff surgery      Current Outpatient Prescriptions  Medication Sig Dispense Refill  . acetaminophen-codeine (TYLENOL #3) 300-30 MG tablet Take 1 tablet by mouth 2 (two) times daily as needed for moderate pain.     [name] alendronate (FOSAMAX) 70 MG tablet Take 70 mg by mouth every Saturday.     [name] amitriptyline (ELAVIL) 50 MG tablet Take 1 tablet (50 mg total) by mouth at bedtime. 90 tablet 0  . Ascorbic Acid (VITAMIN C) 500  MG CAPS Take 500 mg by mouth daily.     [name] atorvastatin (LIPITOR) 40 MG tablet Take 40 mg by mouth daily.    . busPIRone (BUSPAR) 10 MG tablet Take 1 tablet (10 mg total) by mouth 2 (two) times daily. 180 tablet 0  . Cholecalciferol (VITAMIN D-3 PO) Take 1 tablet by mouth daily.    . cyclobenzaprine (FLEXERIL) 5 MG tablet Take 1-2 tablets (5-10 mg total) by mouth 3 (three) times daily as needed for muscle spasms. 30 tablet 3  . diazepam (VALIUM) 5 MG tablet 1-2 tabs po 30 mins before MRI 2 tablet 0  . fluticasone (FLONASE) 50 MCG/ACT nasal spray Place 2 sprays into both nostrils daily.   1  . lamoTRIgine (LAMICTAL) 150 MG tablet Take 1 tablet (150 mg total) by mouth daily. 90 tablet 0  . Magnesium Oxide 250 MG TABS Take 250 mg by mouth daily.     . Melatonin 3 MG TABS Take 13  mg by mouth at bedtime.     . metFORMIN (GLUCOPHAGE) 500 MG tablet Take 500 mg by mouth daily with breakfast.    . MYRBETRIQ 25 MG TB24 tablet Take 1 tablet by mouth daily.    . nitrofurantoin (MACRODANTIN) 100 MG capsule Take 1 capsule by mouth daily.    [name] PROAIR HFA 108 (90 BASE) MCG/ACT inhaler Inhale 1 puff daily as needed for wheezing or shortness of breath  11  . ranitidine (ZANTAC) 300 MG tablet Take 300 mg by mouth daily.    [name] RAPAFLO 8 MG CAPS capsule Take 8 mg by mouth daily with breakfast.   11  . spironolactone (ALDACTONE) 100 MG tablet Take 100 mg by mouth 2 (two) times daily.   5  . tamsulosin (FLOMAX) 0.4 MG CAPS capsule Take 0.4 mg by mouth at bedtime.  11  . tiZANidine (ZANAFLEX) 4 MG tablet Take 1 tablet (4 mg total) by mouth every 6 (six) hours as needed for muscle spasms. 60 tablet 0  . traMADol (ULTRAM) 50 MG tablet Take 50 mg by mouth every 6 (six) hours as needed for pain.    [name] triamterene-hydrochlorothiazide (MAXZIDE) 75-50 MG tablet Take 1 tablet by mouth daily.  3  . trimethoprim (TRIMPEX) 100 MG tablet Take 100 mg by mouth at bedtime.  3  . XENICAL 120 MG capsule TK 1 CAPSULE PO THREE TIMES DAILY  5   No current facility-administered medications for this visit.     Allergies as of 11/13/2016 - Review Complete 11/13/2016  Allergen Reaction Noted  . Miconazole nitrate Itching 11/21/2011  . Sulfonamide derivatives Rash     Family History  Problem Relation Age of Onset  . Suicidality Father   . Depression Father   . Alcohol abuse Father   . Emphysema Father     smoked  . Drug abuse Brother   . Alcohol abuse Brother   . Esophageal cancer Brother   . Depression Maternal Aunt   . Alcohol abuse Brother   . Anxiety disorder Daughter   . Depression Daughter   . Suicidality Daughter   . Alcohol abuse Daughter   . Drug abuse Son   . Alcohol abuse Son   . Emphysema Sister     smoked    Social History   Social History  . Marital status: Divorced     Spouse name: N/A  . Number of children: N/A  . Years of education: graduate   Occupational History  . Disabled Disability   Social  History Main Topics  . Smoking status: Former Smoker    Packs/day: 1.00    Years: 31.00    Types: Cigarettes    Quit date: 11/18/1999  . Smokeless tobacco: Never Used  . Alcohol use No  . Drug use: No  . Sexual activity: No   Other Topics Concern  . Not on file   Social History Narrative   Right handed, Disabled. Live boyfriend , [name], Caffeine 3-4 cups.  Disabled.     Physical Exam: BP 112/70   Pulse 100   Ht 5\' 7"  (1.702 m)   Wt 200 lb (90.7 kg)   BMI 31.32 kg/m  Constitutional:Chronically ill-appearing, sits in a scooter to get around  Psychiatric: alert and oriented x3 Eyes: extraocular movements intact Mouth: oral pharynx moist, no lesions Neck: supple no lymphadenopathy Cardiovascular: heart regular rate and rhythm Lungs: clear to auscultation bilaterally Abdomen: soft, nontender, nondistended, no obvious ascites, no peritoneal signs, normal bowel sounds Extremities: no lower extremity edema bilaterally Skin: no lesions on visible extremities   Assessment and plan: 61 y.o. female with  minor rectal bleeding, also history of acromegaly she has a history of acromegaly which puts her at slightly increased risk for colon cancer and gastric cancer. Her last screening examinations were almost 5 years ago. She is due for screening this year. She has seen some very minor amount of rectal bleeding. Has no other GI symptoms. I suspect it is benign, anal rectal in origin. I recommended we proceed with colonoscopy now for that minor rectal bleeding and at the same time I will proceed with gastric cancer screening with upper endoscopy.  Owens Loffler, MD [facility] 11/13/2016, 10:03 AM  Cc: Cyndi Bender (PA-C)

## 2016-11-29 NOTE — Transfer of Care (Signed)
Immediate Anesthesia Transfer of Care Note  Patient: [name]  Procedure(s) Performed: Procedure(s): ESOPHAGOGASTRODUODENOSCOPY (EGD) WITH PROPOFOL (N/A) COLONOSCOPY WITH PROPOFOL (N/A)  Patient Location: PACU and Endoscopy Unit  Anesthesia Type:MAC  Level of Consciousness: awake, alert  and patient cooperative  Airway & Oxygen Therapy: Patient Spontanous Breathing and Patient connected to nasal cannula oxygen  Post-op Assessment: Report given to RN and Post -op Vital signs reviewed and stable  Post vital signs: Reviewed and stable  Last Vitals:  Vitals:   11/29/16 1159  BP: 130/81  Pulse: 77  Resp: 16  Temp: 36.4 C    Last Pain: There were no vitals filed for this visit.       Complications: No apparent anesthesia complications

## 2016-11-29 NOTE — Op Note (Signed)
[facility] Patient Name: [name] Procedure Date: 11/29/2016 MRN: [id number] Attending MD: [name] , MD Date of Birth: November 22, 1955 CSN: [id number] Age: 61 Admit Type: Outpatient Procedure:                Colonoscopy Indications:              Screening for colorectal malignant neoplasm, at                            elevated risk given Acromegaly; colonoscopy 2013                            was normal. Providers:                [name], MD, Laverta Baltimore RN, RN,                            Elspeth Cho Tech., Technician, Dione Booze,                            CRNA Referring MD:              Medicines:                Monitored Anesthesia Care Complications:            No immediate complications. Estimated blood loss:                            None. Estimated Blood Loss:     Estimated blood loss: none. Procedure:                Pre-Anesthesia Assessment:                           - Prior to the procedure, a History and Physical                            was performed, and patient medications and                            allergies were reviewed. The patient's tolerance of                            previous anesthesia was also reviewed. The risks                            and benefits of the procedure and the sedation                            options and risks were discussed with the patient.                            All questions were answered, and informed consent                            was obtained. Prior Anticoagulants: The patient has  taken no previous anticoagulant or antiplatelet                            agents. ASA Grade Assessment: II - A patient with                            mild systemic disease. After reviewing the risks                            and benefits, the patient was deemed in                            satisfactory condition to undergo the procedure.    After obtaining informed consent, the colonoscope                            was passed under direct vision. Throughout the                            procedure, the patient's blood pressure, pulse, and                            oxygen saturations were monitored continuously. The                            [id number] scope was introduced through                            the anus and advanced to the the cecum, identified                            by appendiceal orifice and ileocecal valve. The                            colonoscopy was performed without difficulty. The                            patient tolerated the procedure well. The quality                            of the bowel preparation was adequate. The                            ileocecal valve, appendiceal orifice, and rectum                            were photographed. Scope In: 12:19:50 PM Scope Out: 12:37:40 PM Scope Withdrawal Time: 0 hours 12 minutes 9 seconds  Total Procedure Duration: 0 hours 17 minutes 50 seconds  Findings:      External and internal hemorrhoids were found. The hemorrhoids were       medium-sized.      The exam was otherwise without abnormality on direct and retroflexion       views. Impression:               -  External and internal hemorrhoids.                           - The examination was otherwise normal on direct                            and retroflexion views.                           - No polyps or cancers. Moderate Sedation:      N/A- Per Anesthesia Care Recommendation:           - Patient has a contact number available for                            emergencies. The signs and symptoms of potential                            delayed complications were discussed with the                            patient. Return to normal activities tomorrow.                            Written discharge instructions were provided to the                            patient.                            - Resume previous diet.                           - Continue present medications.                           - Repeat colonoscopy in 5 years for screening                            purposes. Procedure Code(s):        --- Professional ---                           [phone], Colonoscopy, flexible; diagnostic, including                            collection of specimen(s) by brushing or washing,                            when performed (separate procedure) Diagnosis Code(s):        --- Professional ---                           Z12.11, Encounter for screening for malignant                            neoplasm of colon  K64.8, Other hemorrhoids CPT copyright 2016 American Medical Association. All rights reserved. The codes documented in this report are preliminary and upon coder review may  be revised to meet current compliance requirements. [name], MD 11/29/2016 12:40:35 PM This report has been signed electronically. Number of Addenda: 0

## 2016-11-29 NOTE — Discharge Instructions (Signed)

## 2016-11-29 NOTE — Anesthesia Procedure Notes (Signed)
Procedure Name: MAC Date/Time: 11/29/2016 12:13 PM Performed by: Dione Booze Pre-anesthesia Checklist: Patient identified, Emergency Drugs available, Suction available and Patient being monitored Patient Re-evaluated:Patient Re-evaluated prior to inductionOxygen Delivery Method: Nasal cannula Placement Confirmation: positive ETCO2

## 2016-11-29 NOTE — Anesthesia Postprocedure Evaluation (Signed)
Anesthesia Post Note  Patient: TYKERIA WAWRZYNIAK  Procedure(s) Performed: Procedure(s) (LRB): ESOPHAGOGASTRODUODENOSCOPY (EGD) WITH PROPOFOL (N/A) COLONOSCOPY WITH PROPOFOL (N/A)  Patient location during evaluation: Endoscopy Anesthesia Type: MAC Level of consciousness: awake and alert Pain management: pain level controlled Vital Signs Assessment: post-procedure vital signs reviewed and stable Respiratory status: spontaneous breathing, nonlabored ventilation, respiratory function stable and patient connected to nasal cannula oxygen Cardiovascular status: stable and blood pressure returned to baseline Anesthetic complications: no       Last Vitals:  Vitals:   11/29/16 1159 11/29/16 1257  BP: 130/81 114/62  Pulse: 77 76  Resp: 16 16  Temp: 36.4 C 36.5 C    Last Pain:  Vitals:   11/29/16 1257  TempSrc: Oral                 Catalina Gravel

## 2016-11-29 NOTE — Anesthesia Preprocedure Evaluation (Signed)
Anesthesia Evaluation  Patient identified by MRN, date of birth, ID band Patient awake    Reviewed: Allergy & Precautions, NPO status , Patient's Chart, lab work & pertinent test results  Airway Mallampati: II  TM Distance: >3 FB Neck ROM: Full    Dental  (+) Teeth Intact, Dental Advisory Given   Pulmonary COPD, former smoker,    Pulmonary exam normal breath sounds clear to auscultation       Cardiovascular Exercise Tolerance: Good negative cardio ROS Normal cardiovascular exam Rhythm:Regular Rate:Normal     Neuro/Psych  Headaches, PSYCHIATRIC DISORDERS Anxiety Depression Bipolar Disorder CP S/p ACDF    GI/Hepatic Neg liver ROS, GERD  Medicated,  Endo/Other  diabetes, Type 2, Oral Hypoglycemic AgentsObesity   Renal/GU negative Renal ROS     Musculoskeletal  (+) Arthritis ,   Abdominal   Peds  Hematology negative hematology ROS (+)   Anesthesia Other Findings Day of surgery medications reviewed with the patient.  Reproductive/Obstetrics                             Anesthesia Physical Anesthesia Plan  ASA: II  Anesthesia Plan: MAC   Post-op Pain Management:    Induction: Intravenous  Airway Management Planned: Nasal Cannula  Additional Equipment:   Intra-op Plan:   Post-operative Plan:   Informed Consent: I have reviewed the patients History and Physical, chart, labs and discussed the procedure including the risks, benefits and alternatives for the proposed anesthesia with the patient or authorized representative who has indicated his/her understanding and acceptance.   Dental advisory given  Plan Discussed with: CRNA and Anesthesiologist  Anesthesia Plan Comments: (Discussed risks/benefits/alternatives to MAC sedation including need for ventilatory support, hypotension, need for conversion to general anesthesia.  All patient questions answered.  Patient/guardian wishes to  proceed.)        Anesthesia Quick Evaluation

## 2016-12-02 ENCOUNTER — Encounter (HOSPITAL_COMMUNITY): Payer: Self-pay | Admitting: Gastroenterology

## 2016-12-10 ENCOUNTER — Ambulatory Visit (INDEPENDENT_AMBULATORY_CARE_PROVIDER_SITE_OTHER): Payer: Medicare Other | Admitting: Physical Medicine and Rehabilitation

## 2016-12-10 ENCOUNTER — Encounter (INDEPENDENT_AMBULATORY_CARE_PROVIDER_SITE_OTHER): Payer: Self-pay | Admitting: Physical Medicine and Rehabilitation

## 2016-12-10 ENCOUNTER — Ambulatory Visit (INDEPENDENT_AMBULATORY_CARE_PROVIDER_SITE_OTHER): Payer: Self-pay

## 2016-12-10 VITALS — BP 110/70

## 2016-12-10 DIAGNOSIS — M25511 Pain in right shoulder: Secondary | ICD-10-CM

## 2016-12-10 DIAGNOSIS — G8929 Other chronic pain: Secondary | ICD-10-CM | POA: Diagnosis not present

## 2016-12-10 MED ORDER — BUPIVACAINE HCL 0.5 % IJ SOLN
3.0000 mL | INTRAMUSCULAR | Status: AC | PRN
  Administered 2016-12-10: 3 mL via INTRA_ARTICULAR

## 2016-12-10 MED ORDER — TRIAMCINOLONE ACETONIDE 40 MG/ML IJ SUSP
80.0000 mg | INTRAMUSCULAR | Status: AC | PRN
  Administered 2016-12-10: 80 mg via INTRA_ARTICULAR

## 2016-12-10 NOTE — Progress Notes (Signed)
[name] - 61 y.o. female MRN [id number]  Date of birth: 1956/02/21  Office Visit Note: Visit Date: 12/10/2016 PCP: [facility] Referred by: Practice, Darel Hong*  Subjective: Chief Complaint  Patient presents with  . Right Shoulder - Pain   HPI: [name] is a 61 year old female with chronic worsening episodic right shoulder pain. Comes and goes. She cannot relate to any certain movement. MRI of the right shoulder shows rotator cuff tear with before meals joint arthritis.    ROS Otherwise per HPI.  Assessment & Plan: Visit Diagnoses:  1. Chronic right shoulder pain     Plan: Findings:  Right anesthetic glenohumeral joint arthrogram.    Meds & Orders: No orders of the defined types were placed in this encounter.   Orders Placed This Encounter  Procedures  . Large Joint Injection/Arthrocentesis  . XR C-ARM NO REPORT    Follow-up: Return if symptoms worsen or fail to improve after 2 weeks , for Dr. Erlinda Hong.   Procedures: Glenohumeral joint anesthetic arthrogram Date/Time: 12/10/2016 9:37 AM Performed by: Magnus Sinning Authorized by: Magnus Sinning   Consent Given by:  Patient Site marked: the procedure site was marked   Timeout: prior to procedure the correct patient, procedure, and site was verified   Indications:  Pain and diagnostic evaluation Location:  Shoulder Site:  R glenohumeral Prep: patient was prepped and draped in usual sterile fashion   Needle Size:  22 G Needle Length:  3.5 inches Approach:  Anteromedial Ultrasound Guidance: No   Fluoroscopic Guidance: No   Arthrogram: Yes   Medications:  3 mL bupivacaine 0.5 %; 80 mg triamcinolone acetonide 40 MG/ML Aspiration Attempted: No   Patient tolerance:  Patient tolerated the procedure well with no immediate complications  Arthrogram demonstrated excellent flow of contrast throughout the joint surface without extravasation or obvious defect.  She could not tell any relief  during the anesthetic phase only because it is episodic pain.      No notes on file   Clinical History: No specialty comments available.  She reports that she quit smoking about 17 years ago. Her smoking use included Cigarettes. She has a 31.00 pack-year smoking history. She has never used smokeless tobacco.   Recent Labs  12/15/15 1428 05/02/16 1126  HGBA1C 5.8* 6.9*    Objective:  VS:  HT:    WT:   BMI:     BP:110/70  HR: bpm  TEMP: ( )  RESP:  Physical Exam  Musculoskeletal:  Right shoulder with decent range of motion and some mild impingement at end ranges with pain over the before meals joint and some pain over the biceps tendon.    Ortho Exam Imaging: Xr C-arm No Report  Result Date: 12/10/2016 Please see Notes or Procedures tab for imaging impression.   Past Medical/Family/Surgical/Social History: Medications & Allergies reviewed per EMR Patient Active Problem List   Diagnosis Date Noted  . Colon cancer screening   . Encounter for screening for gastric cancer    . Osteoarthritis of spine with radiculopathy, cervical region 05/25/2016  . Postmenopausal bleeding 08/04/2015  . Acute respiratory failure ([place]) 07/19/2013  . Nocturnal hypoxemia 07/07/2013  . UTI (lower urinary tract infection) 07/07/2013  . Hypokalemia 07/07/2013  . Abdominal pain, acute, right lower quadrant 07/07/2013  . Benign paroxysmal positional vertigo 02/24/2013  . Edema of both legs 02/24/2013  . Dysplasia of cervix   . Melanoma in situ ([place])   . Bipolar 1 disorder ([place]) 02/08/2012  .  IBS 12/04/2007  . RASH AND OTHER NONSPECIFIC SKIN ERUPTION 12/04/2007  . EMPHYSEMA by CT only 09/05/2007  . NECK MASS 09/05/2007  . 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[place]) 08/13/2007  . Infantile cerebral palsy ([place]) 08/13/2007  . OTITIS EXTERNA, ACUTE 08/13/2007  . IRRITABLE BOWEL SYNDROME, HX OF 08/13/2007  . HYPERLIPIDEMIA 05/10/2007  . DEPRESSION 05/10/2007  . ALLERGIC RHINITIS 05/10/2007   Past Medical History:   Diagnosis Date  . Acromegaly ([place])   . Anxiety   . Arthritis   . Bacterial infection   . Benign paroxysmal positional vertigo 02/24/2013  . Bladder infection   . Cerebral palsy ([place])   . Colitis   . Depression    bi polar  . Diabetes mellitus, type II ([place])   . Dysplasia of cervix   . GERD (gastroesophageal reflux disease)   . Headache(784.0)   . High cholesterol   . History of chicken pox   . History of measles   . History of mumps   . IBS (irritable bowel syndrome)   . Kidney infection   . Melanoma in situ ([place])   . Neck mass   . Ovarian cyst   . Stenosis, cervical spine 05/16  . Trichomonas   . UTI (urinary tract infection)    current  . Yeast infection    Family History  Problem Relation Age of Onset  . Suicidality Father   . Depression Father   . Alcohol abuse Father   . Emphysema Father     smoked  . Drug abuse Brother   . Alcohol abuse Brother   . Esophageal cancer Brother   . Depression Maternal Aunt   . Alcohol abuse Brother   . Anxiety disorder Daughter   . Depression Daughter   . Suicidality Daughter   . Alcohol abuse Daughter   . Drug abuse Son   . Alcohol abuse Son   . Emphysema Sister     smoked   Past Surgical History:  Procedure Laterality Date  . ANTERIOR CERVICAL DECOMP/DISCECTOMY FUSION N/A 05/25/2016   Procedure: Cervical five-six Anterior cervical decompression/diskectomy/fusion;  Surgeon: Ashok Pall, MD;  Location: [facility];  Service: Neurosurgery;  Laterality: N/A;  . APPENDECTOMY    . CARPAL TUNNEL RELEASE    . COLONOSCOPY WITH PROPOFOL N/A 11/29/2016   Procedure: COLONOSCOPY WITH PROPOFOL;  Surgeon: [name], MD;  Location: [facility];  Service: Endoscopy;  Laterality: N/A;  . ESOPHAGOGASTRODUODENOSCOPY (EGD) WITH PROPOFOL N/A 11/29/2016   Procedure: ESOPHAGOGASTRODUODENOSCOPY (EGD) WITH PROPOFOL;  Surgeon: [name], MD;  Location: [facility];  Service: Endoscopy;  Laterality: N/A;  . JOINT REPLACEMENT    .  knee rotation    . LEG TENDON SURGERY    . OVARIAN CYST REMOVAL    . REPLACEMENT TOTAL KNEE BILATERAL  2006  . rotator cuff surgery     Social History   Occupational History  . Disabled Disability   Social History Main Topics  . Smoking status: Former Smoker    Packs/day: 1.00    Years: 31.00    Types: Cigarettes    Quit date: 11/18/1999  . Smokeless tobacco: Never Used  . Alcohol use No  . Drug use: No  . Sexual activity: No

## 2016-12-10 NOTE — Patient Instructions (Signed)

## 2016-12-18 ENCOUNTER — Other Ambulatory Visit (HOSPITAL_COMMUNITY): Payer: Self-pay | Admitting: Psychiatry

## 2016-12-18 DIAGNOSIS — F319 Bipolar disorder, unspecified: Secondary | ICD-10-CM

## 2016-12-18 DIAGNOSIS — F419 Anxiety disorder, unspecified: Secondary | ICD-10-CM

## 2016-12-21 ENCOUNTER — Other Ambulatory Visit (HOSPITAL_COMMUNITY): Payer: Self-pay | Admitting: Psychiatry

## 2016-12-21 DIAGNOSIS — F419 Anxiety disorder, unspecified: Secondary | ICD-10-CM

## 2016-12-21 DIAGNOSIS — F319 Bipolar disorder, unspecified: Secondary | ICD-10-CM

## 2016-12-26 ENCOUNTER — Ambulatory Visit (INDEPENDENT_AMBULATORY_CARE_PROVIDER_SITE_OTHER): Payer: 59 | Admitting: Psychiatry

## 2016-12-26 ENCOUNTER — Encounter (HOSPITAL_COMMUNITY): Payer: Self-pay | Admitting: Psychiatry

## 2016-12-26 DIAGNOSIS — Z87891 Personal history of nicotine dependence: Secondary | ICD-10-CM | POA: Diagnosis not present

## 2016-12-26 DIAGNOSIS — Z818 Family history of other mental and behavioral disorders: Secondary | ICD-10-CM

## 2016-12-26 DIAGNOSIS — Z79899 Other long term (current) drug therapy: Secondary | ICD-10-CM

## 2016-12-26 DIAGNOSIS — F319 Bipolar disorder, unspecified: Secondary | ICD-10-CM

## 2016-12-26 DIAGNOSIS — F419 Anxiety disorder, unspecified: Secondary | ICD-10-CM

## 2016-12-26 DIAGNOSIS — Z882 Allergy status to sulfonamides status: Secondary | ICD-10-CM

## 2016-12-26 DIAGNOSIS — Z813 Family history of other psychoactive substance abuse and dependence: Secondary | ICD-10-CM

## 2016-12-26 DIAGNOSIS — Z811 Family history of alcohol abuse and dependence: Secondary | ICD-10-CM

## 2016-12-26 DIAGNOSIS — Z888 Allergy status to other drugs, medicaments and biological substances status: Secondary | ICD-10-CM | POA: Diagnosis not present

## 2016-12-26 MED ORDER — AMITRIPTYLINE HCL 50 MG PO TABS: 50.0000 mg | ORAL_TABLET | Freq: Every day | ORAL | 0 refills | Status: DC

## 2016-12-26 MED ORDER — LAMOTRIGINE 150 MG PO TABS: 150.0000 mg | ORAL_TABLET | Freq: Every day | ORAL | 0 refills | Status: DC
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
## 2016-12-27 NOTE — Telephone Encounter (Signed)
Met with Dr. Adele Schilder who had seen patient on 12/26/16 but did not send new order for patient's prescribed Buspar.  Dr. Adele Schilder approved a new 90 day order for Buspar 10 mg, one twice a day, #180 with no refills and order e-scribed to patient's [facility] in [place].

## 2017-01-14 ENCOUNTER — Ambulatory Visit (INDEPENDENT_AMBULATORY_CARE_PROVIDER_SITE_OTHER): Payer: Medicare Other | Admitting: Orthopaedic Surgery

## 2017-01-14 DIAGNOSIS — M5412 Radiculopathy, cervical region: Secondary | ICD-10-CM | POA: Diagnosis not present

## 2017-01-14 NOTE — Progress Notes (Signed)
Office Visit Note   Patient: [name]           Date of Birth: 1956/03/02           MRN: [id number] Visit Date: 01/14/2017              Requested by: [facility] [place], [place] [address] PCP: [facility]   Assessment & Plan: Visit Diagnoses: No diagnosis found.  Plan: Referral to Dr. Ernestina Patches for her cervical spine epidural steroid injection. She does have a previous MRI from last year.  Follow-Up Instructions: Return if symptoms worsen or fail to improve.   Orders:  No orders of the defined types were placed in this encounter.  No orders of the defined types were placed in this encounter.     Procedures: No procedures performed   Clinical Data: No additional findings.   Subjective: Chief Complaint  Patient presents with  . Neck - Pain    Patient comes in today with neck and right shoulder pain. She has had a cervical MRI with multilevel degenerative disc disease. The right shoulder has been stable. She states the pain radiates down into the elbow. Denies any numbness or tingling.    Review of Systems  Constitutional: Negative.   HENT: Negative.   Eyes: Negative.   Respiratory: Negative.   Cardiovascular: Negative.   Endocrine: Negative.   Musculoskeletal: Negative.   Neurological: Negative.   Hematological: Negative.   Psychiatric/Behavioral: Negative.   All other systems reviewed and are negative.    Objective: Vital Signs: There were no vitals taken for this visit.  Physical Exam  Constitutional: She is oriented to person, place, and time. She appears well-developed and well-nourished.  Pulmonary/Chest: Effort normal.  Neurological: She is alert and oriented to person, place, and time.  Skin: Skin is warm. Capillary refill takes less than 2 seconds.  Psychiatric: She has a normal mood and affect. Her behavior is normal. Judgment and thought content normal.  Nursing note and vitals  reviewed.   Ortho Exam Right shoulder exam is essentially benign. She has some mild impingement signs and grossly normal rotator cuff function. She does have a positive Spurling sign. Specialty Comments:  No specialty comments available.  Imaging: No results found.   PMFS History: Patient Active Problem List   Diagnosis Date Noted  . Colon cancer screening   . Encounter for screening for gastric cancer    . Osteoarthritis of spine with radiculopathy, cervical region 05/25/2016  . Postmenopausal bleeding 08/04/2015  . Acute respiratory failure ([place]) 07/19/2013  . Nocturnal hypoxemia 07/07/2013  . UTI (lower urinary tract infection) 07/07/2013  . Hypokalemia 07/07/2013  . Abdominal pain, acute, right lower quadrant 07/07/2013  . Benign paroxysmal positional vertigo 02/24/2013  . Edema of both legs 02/24/2013  . Dysplasia of cervix   . Melanoma in situ ([place])   . Bipolar 1 disorder ([place]) 02/08/2012  . IBS 12/04/2007  . RASH AND OTHER NONSPECIFIC SKIN ERUPTION 12/04/2007  . EMPHYSEMA by CT only 09/05/2007  . NECK MASS 09/05/2007  . 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[place]) 08/13/2007  . Infantile cerebral palsy ([place]) 08/13/2007  . OTITIS EXTERNA, ACUTE 08/13/2007  . IRRITABLE BOWEL SYNDROME, HX OF 08/13/2007  . HYPERLIPIDEMIA 05/10/2007  . DEPRESSION 05/10/2007  . ALLERGIC RHINITIS 05/10/2007   Past Medical History:  Diagnosis Date  . Acromegaly ([place])   . Anxiety   . Arthritis   . Bacterial infection   . Benign paroxysmal positional vertigo 02/24/2013  .  Bladder infection   . Cerebral palsy ([place])   . Colitis   . Depression    bi polar  . Diabetes mellitus, type II ([place])   . Dysplasia of cervix   . GERD (gastroesophageal reflux disease)   . Headache(784.0)   . High cholesterol   . History of chicken pox   . History of measles   . History of mumps   . IBS (irritable bowel syndrome)   . Kidney infection   . Melanoma in situ ([place])   . Neck mass   . Ovarian cyst   . Stenosis, cervical  spine 05/16  . Trichomonas   . UTI (urinary tract infection)    current  . Yeast infection     Family History  Problem Relation Age of Onset  . Suicidality Father   . Depression Father   . Alcohol abuse Father   . Emphysema Father     smoked  . Drug abuse Brother   . Alcohol abuse Brother   . Esophageal cancer Brother   . Depression Maternal Aunt   . Alcohol abuse Brother   . Anxiety disorder Daughter   . Depression Daughter   . Suicidality Daughter   . Alcohol abuse Daughter   . Drug abuse Son   . Alcohol abuse Son   . Emphysema Sister     smoked    Past Surgical History:  Procedure Laterality Date  . ANTERIOR CERVICAL DECOMP/DISCECTOMY FUSION N/A 05/25/2016   Procedure: Cervical five-six Anterior cervical decompression/diskectomy/fusion;  Surgeon: Ashok Pall, MD;  Location: [facility];  Service: Neurosurgery;  Laterality: N/A;  . APPENDECTOMY    . CARPAL TUNNEL RELEASE    . COLONOSCOPY WITH PROPOFOL N/A 11/29/2016   Procedure: COLONOSCOPY WITH PROPOFOL;  Surgeon: [name], MD;  Location: [facility];  Service: Endoscopy;  Laterality: N/A;  . ESOPHAGOGASTRODUODENOSCOPY (EGD) WITH PROPOFOL N/A 11/29/2016   Procedure: ESOPHAGOGASTRODUODENOSCOPY (EGD) WITH PROPOFOL;  Surgeon: [name], MD;  [facility];  Service: Endoscopy;  Laterality: N/A;  . JOINT REPLACEMENT    . knee rotation    . LEG TENDON SURGERY    . OVARIAN CYST REMOVAL    . REPLACEMENT TOTAL KNEE BILATERAL  2006  . rotator cuff surgery     Social History   Occupational History  . Disabled Disability   Social History Main Topics  . Smoking status: Former Smoker    Packs/day: 1.00    Years: 31.00    Types: Cigarettes    Quit date: 11/18/1999  . Smokeless tobacco: Never Used  . Alcohol use No  . Drug use: No  . Sexual activity: No

## 2017-01-14 NOTE — Addendum Note (Signed)
Addended by: Precious Bard on: 01/14/2017 01:42 PM   Modules accepted: Orders

## 2017-02-20 ENCOUNTER — Other Ambulatory Visit (HOSPITAL_COMMUNITY)
Admission: RE | Admit: 2017-02-20 | Discharge: 2017-02-20 | Disposition: A | Discharge status: Home / Self Care | Payer: Medicare Other | Source: Ambulatory Visit | Attending: Obstetrics & Gynecology | Admitting: Obstetrics & Gynecology

## 2017-02-20 ENCOUNTER — Ambulatory Visit (INDEPENDENT_AMBULATORY_CARE_PROVIDER_SITE_OTHER): Payer: Medicare Other | Admitting: *Deleted

## 2017-02-20 DIAGNOSIS — N898 Other specified noninflammatory disorders of vagina: Secondary | ICD-10-CM | POA: Insufficient documentation

## 2017-02-20 NOTE — Progress Notes (Signed)
Pt reports having vaginal irritation and thinks she may have a yeast infection. She has not had sex in 92 years and declines testing for STI. Pt performed self vaginal swab for testing and will be called back with results.

## 2017-02-21 LAB — CERVICOVAGINAL ANCILLARY ONLY
BACTERIAL VAGINITIS: NEGATIVE
CANDIDA VAGINITIS: NEGATIVE

## 2017-02-22 ENCOUNTER — Telehealth: Payer: Self-pay | Admitting: *Deleted

## 2017-02-22 NOTE — Telephone Encounter (Signed)
Called pt and informed her of negative test results for yeast and BV.  Pt reports she feels like she has a yeast infection and also that there was blood on the swab test. I suggested that she may want to try OTC hydrocortisone cream externally. She can also try the OTC medication for vaginal yeast since this will not be harmful but since her test was negative, I cannot send in Rx to her pharmacy.  IF she is not improved in 1 week, she may want to call and schedule appt with a provider in this office.  Pt voiced understanding of all information and instructions given.

## 2017-03-04 ENCOUNTER — Ambulatory Visit (INDEPENDENT_AMBULATORY_CARE_PROVIDER_SITE_OTHER): Payer: Medicare Other | Admitting: Orthopaedic Surgery

## 2017-03-04 ENCOUNTER — Encounter (INDEPENDENT_AMBULATORY_CARE_PROVIDER_SITE_OTHER): Payer: Self-pay | Admitting: Orthopaedic Surgery

## 2017-03-04 DIAGNOSIS — G8929 Other chronic pain: Secondary | ICD-10-CM | POA: Diagnosis not present

## 2017-03-04 DIAGNOSIS — M25511 Pain in right shoulder: Secondary | ICD-10-CM | POA: Diagnosis not present

## 2017-03-04 NOTE — Progress Notes (Signed)
Office Visit Note   Patient: [name]           Date of Birth: June 01, 1956           MRN: [id number] Visit Date: 03/04/2017              Requested by: Practice, [place], [place] [id number] PCP: [facility]   Assessment & Plan: Visit Diagnoses:  1. Chronic right shoulder pain     Plan: Subacromial injection was performed today patient tolerates well for the acute pain of think this is more of a muscular contusion.  Recommend taking ibuprofen and Tylenol and ice as needed. Questions encouraged and answered. Follow-up with me as needed.  Follow-Up Instructions: Return if symptoms worsen or fail to improve.   Orders:  Orders Placed This Encounter  Procedures  . Ambulatory referral to Physical Medicine Rehab   No orders of the defined types were placed in this encounter.     Procedures: Large Joint Inj Date/Time: 03/04/2017 11:19 AM Performed by: Leandrew Koyanagi Authorized by: Leandrew Koyanagi   Consent Given by:  Patient Timeout: prior to procedure the correct patient, procedure, and site was verified   Indications:  Pain Location:  Shoulder Site:  R subacromial bursa Prep: patient was prepped and draped in usual sterile fashion   Needle Size:  22 G Approach:  Posterior Ultrasound Guidance: No   Fluoroscopic Guidance: No       Clinical Data: No additional findings.   Subjective: Chief Complaint  Patient presents with  . Right Shoulder - Pain    Patient comes back today for acute worsening of right shoulder pain. She also started having worsening pain of her baseline shoulder pain. She was requesting another injection. She fell directly onto her right shoulder last week. She endorses pain in the deltoid region mainly    Review of Systems  Constitutional: Negative.   HENT: Negative.   Eyes: Negative.   Respiratory: Negative.   Cardiovascular: Negative.   Endocrine: Negative.     Musculoskeletal: Negative.   Neurological: Negative.   Hematological: Negative.   Psychiatric/Behavioral: Negative.   All other systems reviewed and are negative.    Objective: Vital Signs: There were no vitals taken for this visit.  Physical Exam  Constitutional: She is oriented to person, place, and time. She appears well-developed and well-nourished.  Pulmonary/Chest: Effort normal.  Neurological: She is alert and oriented to person, place, and time.  Skin: Skin is warm. Capillary refill takes less than 2 seconds.  Psychiatric: She has a normal mood and affect. Her behavior is normal. Judgment and thought content normal.  Nursing note and vitals reviewed.   Ortho Exam Right shoulder exam shows intact muscle function. She has no radicular symptoms. No focal findings. Positive impingement signs. Specialty Comments:  No specialty comments available.  Imaging: No results found.   PMFS History: Patient Active Problem List   Diagnosis Date Noted  . Cervical radiculopathy 01/14/2017  . Colon cancer screening   . Encounter for screening for gastric cancer    . Osteoarthritis of spine with radiculopathy, cervical region 05/25/2016  . Postmenopausal bleeding 08/04/2015  . Acute respiratory failure ([place]) 07/19/2013  . Nocturnal hypoxemia 07/07/2013  . UTI (lower urinary tract infection) 07/07/2013  . Hypokalemia 07/07/2013  . Abdominal pain, acute, right lower quadrant 07/07/2013  . Benign paroxysmal positional vertigo 02/24/2013  . Edema of both legs 02/24/2013  . Dysplasia of cervix   .  Melanoma in situ ([place])   . Bipolar 1 disorder ([place]) 02/08/2012  . IBS 12/04/2007  . RASH AND OTHER NONSPECIFIC SKIN ERUPTION 12/04/2007  . EMPHYSEMA by CT only 09/05/2007  . NECK MASS 09/05/2007  . 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 Infantile cerebral palsy ([place]) 08/13/2007  . OTITIS EXTERNA, ACUTE 08/13/2007  . IRRITABLE BOWEL SYNDROME, HX OF 08/13/2007  . HYPERLIPIDEMIA 05/10/2007  .  DEPRESSION 05/10/2007  . ALLERGIC RHINITIS 05/10/2007   Past Medical History:  Diagnosis Date  . Acromegaly ([place])   . Anxiety   . Arthritis   . Bacterial infection   . Benign paroxysmal positional vertigo 02/24/2013  . Bladder infection   . Cerebral palsy ([place])   . Colitis   . Depression    bi polar  . Diabetes mellitus, type II ([place])   . Dysplasia of cervix   . GERD (gastroesophageal reflux disease)   . Headache(784.0)   . High cholesterol   . History of chicken pox   . History of measles   . History of mumps   . IBS (irritable bowel syndrome)   . Kidney infection   . Melanoma in situ ([place])   . Neck mass   . Ovarian cyst   . Stenosis, cervical spine 05/16  . Trichomonas   . UTI (urinary tract infection)    current  . Yeast infection     Family History  Problem Relation Age of Onset  . Suicidality Father   . Depression Father   . Alcohol abuse Father   . Emphysema Father     smoked  . Drug abuse Brother   . Alcohol abuse Brother   . Esophageal cancer Brother   . Depression Maternal Aunt   . Alcohol abuse Brother   . Anxiety disorder Daughter   . Depression Daughter   . Suicidality Daughter   . Alcohol abuse Daughter   . Drug abuse Son   . Alcohol abuse Son   . Emphysema Sister     smoked    Past Surgical History:  Procedure Laterality Date  . ANTERIOR CERVICAL DECOMP/DISCECTOMY FUSION N/A 05/25/2016   Procedure: Cervical five-six Anterior cervical decompression/diskectomy/fusion;  Surgeon: Ashok Pall, MD;  Location: [facility];  Service: Neurosurgery;  Laterality: N/A;  . APPENDECTOMY    . CARPAL TUNNEL RELEASE    . COLONOSCOPY WITH PROPOFOL N/A 11/29/2016   Procedure: COLONOSCOPY WITH PROPOFOL;  Surgeon: [name], MD;  [facility];  Service: Endoscopy;  Laterality: N/A;  . ESOPHAGOGASTRODUODENOSCOPY (EGD) WITH PROPOFOL N/A 11/29/2016   Procedure: ESOPHAGOGASTRODUODENOSCOPY (EGD) WITH PROPOFOL;  Surgeon: [name], MD;  Location:  [facility];  Service: Endoscopy;  Laterality: N/A;  . JOINT REPLACEMENT    . knee rotation    . LEG TENDON SURGERY    . OVARIAN CYST REMOVAL    . REPLACEMENT TOTAL KNEE BILATERAL  2006  . rotator cuff surgery     Social History   Occupational History  . Disabled Disability   Social History Main Topics  . Smoking status: Former Smoker    Packs/day: 1.00    Years: 31.00    Types: Cigarettes    Quit date: 11/18/1999  . Smokeless tobacco: Never Used  . Alcohol use No  . Drug use: No  . Sexual activity: No

## 2017-03-14 ENCOUNTER — Ambulatory Visit: Payer: Medicare Other

## 2017-03-20 ENCOUNTER — Other Ambulatory Visit (HOSPITAL_COMMUNITY): Payer: Self-pay | Admitting: Psychiatry

## 2017-03-20 DIAGNOSIS — F319 Bipolar disorder, unspecified: Secondary | ICD-10-CM

## 2017-03-21 ENCOUNTER — Other Ambulatory Visit (HOSPITAL_COMMUNITY): Payer: Self-pay | Admitting: Psychiatry

## 2017-03-21 DIAGNOSIS — F419 Anxiety disorder, unspecified: Secondary | ICD-10-CM

## 2017-03-21 DIAGNOSIS — F319 Bipolar disorder, unspecified: Secondary | ICD-10-CM

## 2017-03-26 ENCOUNTER — Other Ambulatory Visit (HOSPITAL_COMMUNITY): Payer: Self-pay | Admitting: Psychiatry

## 2017-03-26 DIAGNOSIS — F319 Bipolar disorder, unspecified: Secondary | ICD-10-CM

## 2017-03-26 DIAGNOSIS — F419 Anxiety disorder, unspecified: Secondary | ICD-10-CM

## 2017-03-26 NOTE — Telephone Encounter (Signed)
Medication management - Called pt to assess if she needed a new order prior to coming in to see Dr. Adele Schilder 03/28/17?  Pt thought she may so agreed to call her [facility] to verify. Called [place], tech at [employer] who verified pt. has an order ready for pick up.  Called patient back to inform and reminded her of scheduled appointment with Dr. Adele Schilder for 03/28/17.

## 2017-03-28 ENCOUNTER — Encounter (HOSPITAL_COMMUNITY): Payer: Self-pay | Admitting: Psychiatry

## 2017-03-28 ENCOUNTER — Ambulatory Visit (INDEPENDENT_AMBULATORY_CARE_PROVIDER_SITE_OTHER): Payer: Medicare Other | Admitting: Psychiatry

## 2017-03-28 DIAGNOSIS — F319 Bipolar disorder, unspecified: Secondary | ICD-10-CM

## 2017-03-28 DIAGNOSIS — Z813 Family history of other psychoactive substance abuse and dependence: Secondary | ICD-10-CM | POA: Diagnosis not present

## 2017-03-28 DIAGNOSIS — Z811 Family history of alcohol abuse and dependence: Secondary | ICD-10-CM

## 2017-03-28 DIAGNOSIS — F419 Anxiety disorder, unspecified: Secondary | ICD-10-CM | POA: Diagnosis not present

## 2017-03-28 DIAGNOSIS — Z87891 Personal history of nicotine dependence: Secondary | ICD-10-CM

## 2017-03-28 DIAGNOSIS — Z818 Family history of other mental and behavioral disorders: Secondary | ICD-10-CM

## 2017-03-28 MED ORDER — BUSPIRONE HCL 10 MG PO TABS: ORAL_TABLET | ORAL | 0 refills | Status: DC

## 2017-03-28 MED ORDER — AMITRIPTYLINE HCL 50 MG PO TABS: 50.0000 mg | ORAL_TABLET | Freq: Every day | ORAL | 0 refills | Status: DC

## 2017-03-28 MED ORDER — LAMOTRIGINE 150 MG PO TABS: 150.0000 mg | ORAL_TABLET | Freq: Every day | ORAL | 0 refills | Status: DC
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
## 2017-04-17 ENCOUNTER — Other Ambulatory Visit: Payer: Self-pay | Admitting: Neurosurgery

## 2017-04-17 DIAGNOSIS — M4722 Other spondylosis with radiculopathy, cervical region: Secondary | ICD-10-CM

## 2017-04-29 ENCOUNTER — Ambulatory Visit
Admission: RE | Admit: 2017-04-29 | Discharge: 2017-04-29 | Disposition: A | Discharge status: Home / Self Care | Payer: Medicare Other | Source: Ambulatory Visit | Attending: Neurosurgery | Admitting: Neurosurgery

## 2017-04-29 ENCOUNTER — Telehealth (INDEPENDENT_AMBULATORY_CARE_PROVIDER_SITE_OTHER): Payer: Self-pay | Admitting: Orthopaedic Surgery

## 2017-04-29 DIAGNOSIS — M4722 Other spondylosis with radiculopathy, cervical region: Secondary | ICD-10-CM

## 2017-04-29 NOTE — Telephone Encounter (Signed)
Mailed paperwork Dr. Ninfa Linden filled out and signed for SCAT/GTA in enclosed envelope that patient provided to [place]

## 2017-05-13 ENCOUNTER — Ambulatory Visit (INDEPENDENT_AMBULATORY_CARE_PROVIDER_SITE_OTHER): Payer: Medicare Other | Admitting: Orthopaedic Surgery

## 2017-05-13 ENCOUNTER — Encounter (INDEPENDENT_AMBULATORY_CARE_PROVIDER_SITE_OTHER): Payer: Self-pay | Admitting: Orthopaedic Surgery

## 2017-05-13 DIAGNOSIS — M75101 Unspecified rotator cuff tear or rupture of right shoulder, not specified as traumatic: Secondary | ICD-10-CM

## 2017-05-13 MED ORDER — TRAMADOL HCL 50 MG PO TABS: 50.0000 mg | ORAL_TABLET | Freq: Four times a day (QID) | ORAL | 5 refills | Status: DC | PRN

## 2017-05-13 NOTE — Progress Notes (Signed)
Office Visit Note   Patient: [name]           Date of Birth: [birth year]           MRN: [id number] Visit Date: 05/13/2017              Requested by: Practice, [place], [place] [id number] PCP: Practice, [facility]   Assessment & Plan: Visit Diagnoses:  1. Rotator cuff syndrome, right     Plan: Patient had an injection in May which I think is too soon for another one. I did give her prescription for prednisone given her some relief.  Patient is not a good surgical candidate in that she is essentially confined to a [facility] because she was unable to rehabilitation from her knee replacements. Doing surgery on her shoulder would severely limit her functionally and she would likely need placement in a skilled nursing facility postoperatively for a short period of time.  Follow-Up Instructions: Return if symptoms worsen or fail to improve.   Orders:  No orders of the defined types were placed in this encounter.  Meds ordered this encounter  Medications  . traMADol (ULTRAM) 50 MG tablet    Sig: Take 1 tablet (50 mg total) by mouth every 6 (six) hours as needed.    Dispense:  60 tablet    Refill:  5      Procedures: No procedures performed   Clinical Data: No additional findings.   Subjective: Chief Complaint  Patient presents with  . Right Shoulder - Pain    [name] comes back today for her right shoulder pain. Her previous injection did help for a while. Her MRI shows severe rotator cuff tendinosis with full-thickness tear. She would like to try something stronger than Tylenol and Advil. She denies any numbness or tingling.    Review of Systems  Constitutional: Negative.   HENT: Negative.   Eyes: Negative.   Respiratory: Negative.   Cardiovascular: Negative.   Endocrine: Negative.   Musculoskeletal: Negative.   Neurological: Negative.   Hematological: Negative.   Psychiatric/Behavioral: Negative.     All other systems reviewed and are negative.    Objective: Vital Signs: There were no vitals taken for this visit.  Physical Exam  Constitutional: She is oriented to person, place, and time. She appears well-developed and well-nourished.  Pulmonary/Chest: Effort normal.  Neurological: She is alert and oriented to person, place, and time.  Skin: Skin is warm. Capillary refill takes less than 2 seconds.  Psychiatric: She has a normal mood and affect. Her behavior is normal. Judgment and thought content normal.  Nursing note and vitals reviewed.   Ortho Exam Right shoulder exam is stable. Specialty Comments:  No specialty comments available.  Imaging: No results found.   PMFS History: Patient Active Problem List   Diagnosis Date Noted  . Rotator cuff syndrome, right 05/13/2017  . Cervical radiculopathy 01/14/2017  . Colon cancer screening   . Encounter for screening for gastric cancer    . Osteoarthritis of spine with radiculopathy, cervical region 05/25/2016  . Postmenopausal bleeding 08/04/2015  . Acute respiratory failure ([place]) 07/19/2013  . Nocturnal hypoxemia 07/07/2013  . UTI (lower urinary tract infection) 07/07/2013  . Hypokalemia 07/07/2013  . Abdominal pain, acute, right lower quadrant 07/07/2013  . Benign paroxysmal positional vertigo 02/24/2013  . Edema of both legs 02/24/2013  . Dysplasia of cervix   . Melanoma in situ ([place])   . Bipolar 1 disorder (  [place]) 02/08/2012  . IBS 12/04/2007  . RASH AND OTHER NONSPECIFIC SKIN ERUPTION 12/04/2007  . EMPHYSEMA by CT only 09/05/2007  . NECK MASS 09/05/2007  . 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[place]) 08/13/2007  . Infantile cerebral palsy ([place]) 08/13/2007  . OTITIS EXTERNA, ACUTE 08/13/2007  . IRRITABLE BOWEL SYNDROME, HX OF 08/13/2007  . HYPERLIPIDEMIA 05/10/2007  . DEPRESSION 05/10/2007  . ALLERGIC RHINITIS 05/10/2007   Past Medical History:  Diagnosis Date  . Acromegaly ([place])   . Anxiety   . Arthritis   . Bacterial infection    . Benign paroxysmal positional vertigo 02/24/2013  . Bladder infection   . Cerebral palsy ([place])   . Colitis   . Depression    bi polar  . Diabetes mellitus, type II ([place])   . Dysplasia of cervix   . GERD (gastroesophageal reflux disease)   . Headache(784.0)   . High cholesterol   . History of chicken pox   . History of measles   . History of mumps   . IBS (irritable bowel syndrome)   . Kidney infection   . Melanoma in situ ([place])   . Neck mass   . Ovarian cyst   . Stenosis, cervical spine 05/16  . Trichomonas   . UTI (urinary tract infection)    current  . Yeast infection     Family History  Problem Relation Age of Onset  . Suicidality Father   . Depression Father   . Alcohol abuse Father   . Emphysema Father        smoked  . Drug abuse Brother   . Alcohol abuse Brother   . Esophageal cancer Brother   . Depression Maternal Aunt   . Alcohol abuse Brother   . Anxiety disorder Daughter   . Depression Daughter   . Suicidality Daughter   . Alcohol abuse Daughter   . Drug abuse Son   . Alcohol abuse Son   . Emphysema Sister        smoked    Past Surgical History:  Procedure Laterality Date  . ANTERIOR CERVICAL DECOMP/DISCECTOMY FUSION N/A 05/25/2016   Procedure: Cervical five-six Anterior cervical decompression/diskectomy/fusion;  Surgeon: Ashok Pall, MD;  Location: [facility];  Service: Neurosurgery;  Laterality: N/A;  . APPENDECTOMY    . CARPAL TUNNEL RELEASE    . COLONOSCOPY WITH PROPOFOL N/A 11/29/2016   Procedure: COLONOSCOPY WITH PROPOFOL;  Surgeon: [name], MD;  [facility];  Service: Endoscopy;  Laterality: N/A;  . ESOPHAGOGASTRODUODENOSCOPY (EGD) WITH PROPOFOL N/A 11/29/2016   Procedure: ESOPHAGOGASTRODUODENOSCOPY (EGD) WITH PROPOFOL;  Surgeon: [name], MD;  [facility];  Service: Endoscopy;  Laterality: N/A;  . JOINT REPLACEMENT    . knee rotation    . LEG TENDON SURGERY    . OVARIAN CYST REMOVAL    . REPLACEMENT  TOTAL KNEE BILATERAL  2006  . rotator cuff surgery     Social History   Occupational History  . Disabled Disability   Social History Main Topics  . Smoking status: Former Smoker    Packs/day: 1.00    Years: 31.00    Types: Cigarettes    Quit date: 11/18/1999  . Smokeless tobacco: Never Used  . Alcohol use No  . Drug use: No  . Sexual activity: No

## 2017-06-22 ENCOUNTER — Other Ambulatory Visit (HOSPITAL_COMMUNITY): Payer: Self-pay | Admitting: Psychiatry

## 2017-06-22 DIAGNOSIS — F419 Anxiety disorder, unspecified: Secondary | ICD-10-CM

## 2017-06-22 DIAGNOSIS — F319 Bipolar disorder, unspecified: Secondary | ICD-10-CM

## 2017-06-27 ENCOUNTER — Ambulatory Visit (HOSPITAL_COMMUNITY): Payer: Self-pay | Admitting: Psychiatry

## 2017-07-10 ENCOUNTER — Ambulatory Visit (INDEPENDENT_AMBULATORY_CARE_PROVIDER_SITE_OTHER): Payer: Medicare Other | Admitting: Psychiatry

## 2017-07-10 ENCOUNTER — Encounter (HOSPITAL_COMMUNITY): Payer: Self-pay | Admitting: Psychiatry

## 2017-07-10 DIAGNOSIS — F319 Bipolar disorder, unspecified: Secondary | ICD-10-CM

## 2017-07-10 DIAGNOSIS — F419 Anxiety disorder, unspecified: Secondary | ICD-10-CM

## 2017-07-10 DIAGNOSIS — Z87891 Personal history of nicotine dependence: Secondary | ICD-10-CM

## 2017-07-10 DIAGNOSIS — M2550 Pain in unspecified joint: Secondary | ICD-10-CM | POA: Diagnosis not present

## 2017-07-10 DIAGNOSIS — Z811 Family history of alcohol abuse and dependence: Secondary | ICD-10-CM

## 2017-07-10 DIAGNOSIS — Z818 Family history of other mental and behavioral disorders: Secondary | ICD-10-CM | POA: Diagnosis not present

## 2017-07-10 DIAGNOSIS — Z813 Family history of other psychoactive substance abuse and dependence: Secondary | ICD-10-CM

## 2017-07-10 MED ORDER — LAMOTRIGINE 150 MG PO TABS: 150.0000 mg | ORAL_TABLET | Freq: Every day | ORAL | 0 refills | Status: DC

## 2017-07-10 MED ORDER — BUSPIRONE HCL 10 MG PO TABS: ORAL_TABLET | ORAL | 0 refills | Status: DC

## 2017-07-10 MED ORDER — AMITRIPTYLINE HCL 50 MG PO TABS: 50.0000 mg | ORAL_TABLET | Freq: Every day | ORAL | 0 refills | Status: DC
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
## 2017-07-16 ENCOUNTER — Encounter (INDEPENDENT_AMBULATORY_CARE_PROVIDER_SITE_OTHER): Payer: Self-pay | Admitting: Orthopaedic Surgery

## 2017-07-16 ENCOUNTER — Ambulatory Visit (INDEPENDENT_AMBULATORY_CARE_PROVIDER_SITE_OTHER): Payer: Medicare Other | Admitting: Orthopaedic Surgery

## 2017-07-16 DIAGNOSIS — M25511 Pain in right shoulder: Secondary | ICD-10-CM | POA: Diagnosis not present

## 2017-07-16 DIAGNOSIS — G8929 Other chronic pain: Secondary | ICD-10-CM | POA: Insufficient documentation

## 2017-07-16 NOTE — Progress Notes (Signed)
Office Visit Note   Patient: [name]           Date of Birth: 1956/05/12           MRN: [id number] Visit Date: 07/16/2017              Requested by: Practice, [place], [place] [id number] PCP: Practice, [facility]   Assessment & Plan: Visit Diagnoses:  1. Chronic right shoulder pain     Plan: At this point patient has essentially exhausted conservative treatment. She is not a great candidate for shoulder arthroscopy with rotator cuff repair for reasons that I have stated in previous notes. Subacromial injection was performed today. Referral to pain management clinic. Follow-up as needed.  Follow-Up Instructions: Return if symptoms worsen or fail to improve.   Orders:  Orders Placed This Encounter  Procedures  . Ambulatory referral to Pain Clinic   No orders of the defined types were placed in this encounter.     Procedures: Large Joint Inj Date/Time: 07/16/2017 1:56 PM Performed by: Leandrew Koyanagi Authorized by: Leandrew Koyanagi   Consent Given by:  Patient Timeout: prior to procedure the correct patient, procedure, and site was verified   Indications:  Pain Location:  Shoulder Site:  R subacromial bursa Prep: patient was prepped and draped in usual sterile fashion   Needle Size:  22 G Approach:  Posterior Ultrasound Guidance: No   Fluoroscopic Guidance: No       Clinical Data: No additional findings.   Subjective: Chief Complaint  Patient presents with  . Right Shoulder - Pain, Follow-up    Patient is following up for her right shoulder pain. Her last injection was in May which gave her temporary relief. She is scheduled for carpal tunnel release with Dr. Cyndy Freeze in October. Oral medicines have not given her any significant relief.    Review of Systems   Objective: Vital Signs: There were no vitals taken for this visit.  Physical Exam  Ortho Exam Right shoulder exam is stable. Specialty  Comments:  No specialty comments available.  Imaging: No results found.   PMFS History: Patient Active Problem List   Diagnosis Date Noted  . Chronic right shoulder pain 07/16/2017  . Rotator cuff syndrome, right 05/13/2017  . Cervical radiculopathy 01/14/2017  . Colon cancer screening   . Encounter for screening for gastric cancer    . Osteoarthritis of spine with radiculopathy, cervical region 05/25/2016  . Postmenopausal bleeding 08/04/2015  . Acute respiratory failure ([place]) 07/19/2013  . Nocturnal hypoxemia 07/07/2013  . UTI (lower urinary tract infection) 07/07/2013  . Hypokalemia 07/07/2013  . Abdominal pain, acute, right lower quadrant 07/07/2013  . Benign paroxysmal positional vertigo 02/24/2013  . Edema of both legs 02/24/2013  . Dysplasia of cervix   . Melanoma in situ ([place])   . Bipolar 1 disorder ([place]) 02/08/2012  . IBS 12/04/2007  . RASH AND OTHER NONSPECIFIC SKIN ERUPTION 12/04/2007  . EMPHYSEMA by CT only 09/05/2007  . NECK MASS 09/05/2007  . 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 Infantile cerebral palsy ([place]) 08/13/2007  . OTITIS EXTERNA, ACUTE 08/13/2007  . IRRITABLE BOWEL SYNDROME, HX OF 08/13/2007  . HYPERLIPIDEMIA 05/10/2007  . DEPRESSION 05/10/2007  . ALLERGIC RHINITIS 05/10/2007   Past Medical History:  Diagnosis Date  . Acromegaly ([place])   . Anxiety   . Arthritis   . Bacterial infection   . Benign paroxysmal positional vertigo 02/24/2013  . Bladder infection   .  Cerebral palsy ([place])   . Colitis   . Depression    bi polar  . Diabetes mellitus, type II ([place])   . Dysplasia of cervix   . GERD (gastroesophageal reflux disease)   . Headache(784.0)   . High cholesterol   . History of chicken pox   . History of measles   . History of mumps   . IBS (irritable bowel syndrome)   . Kidney infection   . Melanoma in situ ([place])   . Neck mass   . Ovarian cyst   . Stenosis, cervical spine 05/16  . Trichomonas   . UTI (urinary tract infection)    current   . Yeast infection     Family History  Problem Relation Age of Onset  . Suicidality Father   . Depression Father   . Alcohol abuse Father   . Emphysema Father        smoked  . Drug abuse Brother   . Alcohol abuse Brother   . Esophageal cancer Brother   . Depression Maternal Aunt   . Alcohol abuse Brother   . Anxiety disorder Daughter   . Depression Daughter   . Suicidality Daughter   . Alcohol abuse Daughter   . Drug abuse Son   . Alcohol abuse Son   . Emphysema Sister        smoked    Past Surgical History:  Procedure Laterality Date  . ANTERIOR CERVICAL DECOMP/DISCECTOMY FUSION N/A 05/25/2016   Procedure: Cervical five-six Anterior cervical decompression/diskectomy/fusion;  Surgeon: Ashok Pall, MD;  Location: [facility];  Service: Neurosurgery;  Laterality: N/A;  . APPENDECTOMY    . CARPAL TUNNEL RELEASE    . COLONOSCOPY WITH PROPOFOL N/A 11/29/2016   Procedure: COLONOSCOPY WITH PROPOFOL;  Surgeon: [name], MD;  [facility];  Service: Endoscopy;  Laterality: N/A;  . ESOPHAGOGASTRODUODENOSCOPY (EGD) WITH PROPOFOL N/A 11/29/2016   Procedure: ESOPHAGOGASTRODUODENOSCOPY (EGD) WITH PROPOFOL;  Surgeon: [name], MD;  [facility];  Service: Endoscopy;  Laterality: N/A;  . JOINT REPLACEMENT    . knee rotation    . LEG TENDON SURGERY    . OVARIAN CYST REMOVAL    . REPLACEMENT TOTAL KNEE BILATERAL  2006  . rotator cuff surgery     Social History   Occupational History  . Disabled Disability   Social History Main Topics  . Smoking status: Former Smoker    Packs/day: 1.00    Years: 31.00    Types: Cigarettes    Quit date: 11/18/1999  . Smokeless tobacco: Never Used  . Alcohol use No  . Drug use: No  . Sexual activity: No

## 2017-07-26 ENCOUNTER — Other Ambulatory Visit: Payer: Self-pay | Admitting: Neurosurgery

## 2017-07-29 DIAGNOSIS — Z1389 Encounter for screening for other disorder: Secondary | ICD-10-CM | POA: Diagnosis not present

## 2017-07-29 DIAGNOSIS — E782 Mixed hyperlipidemia: Secondary | ICD-10-CM | POA: Diagnosis not present

## 2017-07-29 DIAGNOSIS — Z23 Encounter for immunization: Secondary | ICD-10-CM | POA: Diagnosis not present

## 2017-07-29 DIAGNOSIS — Z79899 Other long term (current) drug therapy: Secondary | ICD-10-CM | POA: Diagnosis not present

## 2017-07-29 DIAGNOSIS — K760 Fatty (change of) liver, not elsewhere classified: Secondary | ICD-10-CM | POA: Diagnosis not present

## 2017-07-29 DIAGNOSIS — E119 Type 2 diabetes mellitus without complications: Secondary | ICD-10-CM | POA: Diagnosis not present

## 2017-07-29 DIAGNOSIS — R609 Edema, unspecified: Secondary | ICD-10-CM | POA: Diagnosis not present

## 2017-07-29 NOTE — Pre-Procedure Instructions (Addendum)
Pessy Delamar [facility]  07/29/2017      [facility] - [place], [place] - [address] AT [facility] [address] [place] [address] Phone: [phone] Fax: [phone]    Your procedure is scheduled on Oct. 5  Report to [facility] at 700 A.M.  Call this number if you have problems the morning of surgery:  [phone]   Remember:  Do not eat food or drink liquids after midnight.  Take these medicines the morning of surgery with A SIP OF WATER Buspirone (Buspar), Flonase spray, Gabapentin (Neurontin), Lamotrigine (Lamictal), Myrbetriq, Proair inhaler if needed, ranitidine (Zantac) if needed, Tramadol (Ultram) if needed, Rapaflo, Flomax  Stop taking aspirin, BC's, Goody's, Herbal medications, Fish Oil, Aleve, ibuprofen, Advil, Motrin    How to Manage Your Diabetes Before and After Surgery  Why is it important to control my blood sugar before and after surgery? . Improving blood sugar levels before and after surgery helps healing and can limit problems. . A way of improving blood sugar control is eating a healthy diet by: o  Eating less sugar and carbohydrates o  Increasing activity/exercise o  Talking with your doctor about reaching your blood sugar goals . High blood sugars (greater than 180 mg/dL) can raise your risk of infections and slow your recovery, so you will need to focus on controlling your diabetes during the weeks before surgery. . Make sure that the doctor who takes care of your diabetes knows about your planned surgery including the date and location.  How do I manage my blood sugar before surgery? . Check your blood sugar at least 4 times a day, starting 2 days before surgery, to make sure that the level is not too high or low. o Check your blood sugar the morning of your surgery when you wake up and every 2 hours until you get to the Short Stay unit. . If your blood sugar is less than 70 mg/dL, you will  need to treat for low blood sugar: o Do not take insulin. o Treat a low blood sugar (less than 70 mg/dL) with  cup of clear juice (cranberry or apple), 4 glucose tablets, OR glucose gel. o Recheck blood sugar in 15 minutes after treatment (to make sure it is greater than 70 mg/dL). If your blood sugar is not greater than 70 mg/dL on recheck, call [phone] for further instructions. . Report your blood sugar to the short stay nurse when you get to Short Stay.  . If you are admitted to the hospital after surgery: o Your blood sugar will be checked by the staff and you will probably be given insulin after surgery (instead of oral diabetes medicines) to make sure you have good blood sugar levels. o The goal for blood sugar control after surgery is 80-180 mg/dL.              WHAT DO I DO ABOUT MY DIABETES MEDICATION?   [name] Do not take oral diabetes medicines (pills) the morning of surgery. Metformin (Glucophage)  . The day of surgery, do not take other diabetes injectables, including Byetta (exenatide), Bydureon (exenatide ER), Victoza (liraglutide), or Trulicity (dulaglutide).  . If your CBG is greater than 220 mg/dL, you may take  of your sliding scale (correction) dose of insulin.  Other Instructions:          Patient Signature:  Date:   Nurse Signature:  Date:   Reviewed and Endorsed by [facility] Patient Education Committee, August 2015   Do not wear jewelry, make-up or nail polish.  Do not wear lotions, powders, or perfumes, or deoderant.  Do not shave 48 hours prior to surgery.  Men may shave face and neck.  Do not bring valuables to the hospital.  [facility] is not responsible for any belongings or valuables.  Contacts, dentures or bridgework may not be worn into surgery.  Leave your suitcase in the car.  After surgery it may be brought to your room.  For patients admitted to the hospital, discharge time will be determined by your treatment team.  Patients  discharged the day of surgery will not be allowed to drive home.   Special instructions: [place] - Preparing for Surgery  Before surgery, you can play an important role.  Because skin is not sterile, your skin needs to be as free of germs as possible.  You can reduce the number of germs on you skin by washing with CHG (chlorahexidine gluconate) soap before surgery.  CHG is an antiseptic cleaner which kills germs and bonds with the skin to continue killing germs even after washing.  Please DO NOT use if you have an allergy to CHG or antibacterial soaps.  If your skin becomes reddened/irritated stop using the CHG and inform your nurse when you arrive at Short Stay.  Do not shave (including legs and underarms) for at least 48 hours prior to the first CHG shower.  You may shave your face.  Please follow these instructions carefully:   1.  Shower with CHG Soap the night before surgery and the                                morning of Surgery.  2.  If you choose to wash your hair, wash your hair first as usual with your       normal shampoo.  3.  After you shampoo, rinse your hair and body thoroughly to remove the                      Shampoo.  4.  Use CHG as you would any other liquid soap.  You can apply chg directly       to the skin and wash gently with scrungie or a clean washcloth.  5.  Apply the CHG Soap to your body ONLY FROM THE NECK DOWN.        Do not use on open wounds or open sores.  Avoid contact with your eyes,       ears, mouth and genitals (private parts).  Wash genitals (private parts)       with your normal soap.  6.  Wash thoroughly, paying special attention to the area where your surgery        will be performed.  7.  Thoroughly rinse your body with warm water from the neck down.  8.  DO NOT shower/wash with your normal soap after using and rinsing off       the CHG Soap.  9.  Pat yourself dry with a clean towel.            10.  Wear clean pajamas.            11.  Place clean  sheets on your bed the night of your first shower and do not  sleep with pets.  Day of Surgery  Do not apply any lotions/deoderants the morning of surgery.  Please wear clean clothes to the hospital/surgery center.     Please read over the following fact sheets that you were given. Pain Booklet, Coughing and Deep Breathing and Surgical Site Infection Prevention

## 2017-07-30 ENCOUNTER — Encounter (HOSPITAL_COMMUNITY): Payer: Self-pay

## 2017-07-30 ENCOUNTER — Encounter (HOSPITAL_COMMUNITY)
Admission: RE | Admit: 2017-07-30 | Discharge: 2017-07-30 | Disposition: A | Discharge status: Home / Self Care | Payer: Medicare Other | Source: Ambulatory Visit | Attending: Neurosurgery | Admitting: Neurosurgery

## 2017-07-30 DIAGNOSIS — Z96653 Presence of artificial knee joint, bilateral: Secondary | ICD-10-CM | POA: Diagnosis not present

## 2017-07-30 DIAGNOSIS — Z87891 Personal history of nicotine dependence: Secondary | ICD-10-CM | POA: Diagnosis not present

## 2017-07-30 DIAGNOSIS — Z85820 Personal history of malignant melanoma of skin: Secondary | ICD-10-CM | POA: Diagnosis not present

## 2017-07-30 DIAGNOSIS — K589 Irritable bowel syndrome without diarrhea: Secondary | ICD-10-CM | POA: Diagnosis not present

## 2017-07-30 DIAGNOSIS — E119 Type 2 diabetes mellitus without complications: Secondary | ICD-10-CM | POA: Diagnosis not present

## 2017-07-30 DIAGNOSIS — J449 Chronic obstructive pulmonary disease, unspecified: Secondary | ICD-10-CM | POA: Diagnosis not present

## 2017-07-30 DIAGNOSIS — K219 Gastro-esophageal reflux disease without esophagitis: Secondary | ICD-10-CM | POA: Diagnosis not present

## 2017-07-30 DIAGNOSIS — Z7984 Long term (current) use of oral hypoglycemic drugs: Secondary | ICD-10-CM | POA: Diagnosis not present

## 2017-07-30 DIAGNOSIS — F419 Anxiety disorder, unspecified: Secondary | ICD-10-CM | POA: Diagnosis not present

## 2017-07-30 DIAGNOSIS — E7800 Pure hypercholesterolemia, unspecified: Secondary | ICD-10-CM | POA: Diagnosis not present

## 2017-07-30 DIAGNOSIS — Z882 Allergy status to sulfonamides status: Secondary | ICD-10-CM | POA: Diagnosis not present

## 2017-07-30 DIAGNOSIS — R51 Headache: Secondary | ICD-10-CM | POA: Diagnosis not present

## 2017-07-30 DIAGNOSIS — E220 Acromegaly and pituitary gigantism: Secondary | ICD-10-CM | POA: Diagnosis not present

## 2017-07-30 DIAGNOSIS — F319 Bipolar disorder, unspecified: Secondary | ICD-10-CM | POA: Diagnosis not present

## 2017-07-30 DIAGNOSIS — Z79899 Other long term (current) drug therapy: Secondary | ICD-10-CM | POA: Diagnosis not present

## 2017-07-30 DIAGNOSIS — G809 Cerebral palsy, unspecified: Secondary | ICD-10-CM | POA: Diagnosis not present

## 2017-07-30 DIAGNOSIS — G5601 Carpal tunnel syndrome, right upper limb: Secondary | ICD-10-CM | POA: Diagnosis not present

## 2017-07-30 DIAGNOSIS — M1990 Unspecified osteoarthritis, unspecified site: Secondary | ICD-10-CM | POA: Diagnosis not present

## 2017-07-30 DIAGNOSIS — H8110 Benign paroxysmal vertigo, unspecified ear: Secondary | ICD-10-CM | POA: Diagnosis not present

## 2017-07-30 HISTORY — DX: Bipolar disorder, unspecified: F31.9

## 2017-07-30 HISTORY — DX: Myoneural disorder, unspecified: G70.9

## 2017-07-30 LAB — CBC
HEMATOCRIT: 43.2 % (ref 36.0–46.0)
HEMOGLOBIN: 13.7 g/dL (ref 12.0–15.0)
MCH: 27.3 pg (ref 26.0–34.0)
MCHC: 31.7 g/dL (ref 30.0–36.0)
MCV: 86.1 fL (ref 78.0–100.0)
Platelets: 309 10*3/uL (ref 150–400)
RBC: 5.02 MIL/uL (ref 3.87–5.11)
RDW: 13.6 % (ref 11.5–15.5)
WBC: 10.6 10*3/uL — AB (ref 4.0–10.5)

## 2017-07-30 LAB — BASIC METABOLIC PANEL
ANION GAP: 11 (ref 5–15)
BUN: 13 mg/dL (ref 6–20)
CO2: 24 mmol/L (ref 22–32)
Calcium: 9 mg/dL (ref 8.9–10.3)
Chloride: 100 mmol/L — ABNORMAL LOW (ref 101–111)
Creatinine, Ser: 0.78 mg/dL (ref 0.44–1.00)
GFR calc Af Amer: >60 mL/min (ref >60)
Glucose, Bld: 120 mg/dL — ABNORMAL HIGH (ref 65–99)
POTASSIUM: 3.8 mmol/L (ref 3.5–5.1)
SODIUM: 135 mmol/L (ref 135–145)

## 2017-07-30 LAB — GLUCOSE, CAPILLARY: Glucose-Capillary: 132 mg/dL — ABNORMAL HIGH (ref 65–99)

## 2017-07-30 LAB — HEMOGLOBIN A1C
Hgb A1c MFr Bld: 6.6 % — ABNORMAL HIGH (ref 4.8–5.6)
MEAN PLASMA GLUCOSE: 142.72 mg/dL

## 2017-07-30 NOTE — Progress Notes (Signed)
PCP is Dr. Megan Salon at [facility] Denies ever seeing a cardiologist.  Denies any chest pain, couth, or fever. Denies ever having a card cath or echo States she had a stress test many years ago. Reports fasting CBGS's run 123-149

## 2017-07-31 DIAGNOSIS — N319 Neuromuscular dysfunction of bladder, unspecified: Secondary | ICD-10-CM | POA: Diagnosis not present

## 2017-07-31 DIAGNOSIS — R310 Gross hematuria: Secondary | ICD-10-CM | POA: Diagnosis not present

## 2017-07-31 DIAGNOSIS — N3021 Other chronic cystitis with hematuria: Secondary | ICD-10-CM | POA: Diagnosis not present

## 2017-07-31 NOTE — Progress Notes (Signed)
Anesthesia Chart Review: Patient is a 61 year old female scheduled for right carpal tunnel release on 08/02/17 by Dr. Ashok Pall. Anesthesia is posted for MAC.  History includes former smoker (quit '01), hypercholesterolemia, cerebral palsy, melanoma in situ, IBS, acromegaly, headaches, colitis, GERD, benign paroxysmal positional vertigo, diabetes mellitus type 2, bipolar disorder, anxiety, appendectomy, bilateral TKA '06, C5-6 ACDF 05/25/16. BMI is consistent with obesity.    PCP is Dr. Daiva Eves with [facility]. Psychiatrist is Dr. Adele Schilder. Urologist is Dr. Karsten Ro.  Meds include amitriptyline, BuSpar, Flonase, Neurontin, Lamictal, Keflex 250 mg Q HS (21 days left on prescription as of 07/30/17), melatonin, metformin, methenamine, Myrbetriq, ProAir HFA, Zantac, Rapaflo, Zocor, Aldactone, Flomax, tramadol,  Maxzide, astragalus supplement.   BP 104/70   Pulse 80   Temp 36.8 C (Oral)   Resp 20   Ht _0  (1.702 m)   SpO2 97%   EKG 07/30/17: NSR, low voltage QRS, septal infarct (old). Since 07/07/13 tracing, first degree AV block no longer present.  Remote history of a negative pharmacologic stress Myoview study in 2006.  Spirometry 07/17/13 showed mild airway obstruction. Saw Dr. Melvyn Novas.  Preoperative labs noted. Cr 0.78. WBC 10.6, H/H 13.7/43.2. A1c 6.6.   If no acute changes then I anticipate that she can proceed as planned.  George [facility] Short Stay Center/Anesthesiology Phone [phone] 07/31/2017 11:38 AM

## 2017-08-01 MED ORDER — CEFAZOLIN SODIUM-DEXTROSE 2-4 GM/100ML-% IV SOLN
2.0000 g | INTRAVENOUS | Status: AC
  Administered 2017-08-02: 2 g via INTRAVENOUS
  Filled 2017-08-01: qty 100

## 2017-08-01 NOTE — Anesthesia Preprocedure Evaluation (Addendum)
Anesthesia Evaluation    Reviewed: Allergy & Precautions, Patient's Chart, lab work & pertinent test results  Airway Mallampati: II  TM Distance: >3 FB Neck ROM: Full    Dental  (+) Teeth Intact, Dental Advisory Given, Caps,    Pulmonary COPD, former smoker,    Pulmonary exam normal breath sounds clear to auscultation       Cardiovascular negative cardio ROS Normal cardiovascular exam Rhythm:Regular Rate:Normal     Neuro/Psych  Headaches, PSYCHIATRIC DISORDERS Anxiety Depression Bipolar Disorder CP  Neuromuscular disease    GI/Hepatic Neg liver ROS, GERD  Medicated,  Endo/Other  diabetes, Type 2, Oral Hypoglycemic Agents  Renal/GU negative Renal ROS     Musculoskeletal  (+) Arthritis , Osteoarthritis,    Abdominal   Peds  Hematology negative hematology ROS (+)   Anesthesia Other Findings Day of surgery medications reviewed with the patient.  Reproductive/Obstetrics                            Anesthesia Physical Anesthesia Plan  ASA: II  Anesthesia Plan: General   Post-op Pain Management:    Induction: Intravenous  PONV Risk Score and Plan: 3 and Ondansetron, Midazolam and Treatment may vary due to age or medical condition  Airway Management Planned: LMA  Additional Equipment:   Intra-op Plan:   Post-operative Plan: Extubation in OR  Informed Consent: I have reviewed the patients History and Physical, chart, labs and discussed the procedure including the risks, benefits and alternatives for the proposed anesthesia with the patient or authorized representative who has indicated his/her understanding and acceptance.   Dental advisory given  Plan Discussed with:   Anesthesia Plan Comments: (Surgeon requesting General anesthesia.)     Anesthesia Quick Evaluation

## 2017-08-02 ENCOUNTER — Ambulatory Visit (HOSPITAL_COMMUNITY)
Admission: RE | Admit: 2017-08-02 | Discharge: 2017-08-02 | Disposition: A | Discharge status: Home / Self Care | Payer: Medicare Other | Source: Ambulatory Visit | Attending: Neurosurgery | Admitting: Neurosurgery

## 2017-08-02 ENCOUNTER — Encounter (HOSPITAL_COMMUNITY)
Admission: RE | Disposition: A | Discharge status: Home / Self Care | Payer: Self-pay | Source: Ambulatory Visit | Attending: Neurosurgery

## 2017-08-02 ENCOUNTER — Ambulatory Visit (HOSPITAL_COMMUNITY): Payer: Medicare Other | Admitting: Vascular Surgery

## 2017-08-02 ENCOUNTER — Encounter (HOSPITAL_COMMUNITY): Payer: Self-pay | Admitting: Certified Registered Nurse Anesthetist

## 2017-08-02 ENCOUNTER — Ambulatory Visit (HOSPITAL_COMMUNITY): Payer: Medicare Other | Admitting: Anesthesiology

## 2017-08-02 DIAGNOSIS — R51 Headache: Secondary | ICD-10-CM | POA: Diagnosis not present

## 2017-08-02 DIAGNOSIS — E119 Type 2 diabetes mellitus without complications: Secondary | ICD-10-CM | POA: Insufficient documentation

## 2017-08-02 DIAGNOSIS — G5601 Carpal tunnel syndrome, right upper limb: Secondary | ICD-10-CM | POA: Insufficient documentation

## 2017-08-02 DIAGNOSIS — K589 Irritable bowel syndrome without diarrhea: Secondary | ICD-10-CM | POA: Diagnosis not present

## 2017-08-02 DIAGNOSIS — Z79899 Other long term (current) drug therapy: Secondary | ICD-10-CM | POA: Diagnosis not present

## 2017-08-02 DIAGNOSIS — M1990 Unspecified osteoarthritis, unspecified site: Secondary | ICD-10-CM | POA: Diagnosis not present

## 2017-08-02 DIAGNOSIS — E7800 Pure hypercholesterolemia, unspecified: Secondary | ICD-10-CM | POA: Insufficient documentation

## 2017-08-02 DIAGNOSIS — E220 Acromegaly and pituitary gigantism: Secondary | ICD-10-CM | POA: Diagnosis not present

## 2017-08-02 DIAGNOSIS — G5603 Carpal tunnel syndrome, bilateral upper limbs: Secondary | ICD-10-CM | POA: Diagnosis not present

## 2017-08-02 DIAGNOSIS — H8110 Benign paroxysmal vertigo, unspecified ear: Secondary | ICD-10-CM | POA: Insufficient documentation

## 2017-08-02 DIAGNOSIS — F419 Anxiety disorder, unspecified: Secondary | ICD-10-CM | POA: Insufficient documentation

## 2017-08-02 DIAGNOSIS — K219 Gastro-esophageal reflux disease without esophagitis: Secondary | ICD-10-CM | POA: Insufficient documentation

## 2017-08-02 DIAGNOSIS — G809 Cerebral palsy, unspecified: Secondary | ICD-10-CM | POA: Insufficient documentation

## 2017-08-02 DIAGNOSIS — Z87891 Personal history of nicotine dependence: Secondary | ICD-10-CM | POA: Insufficient documentation

## 2017-08-02 DIAGNOSIS — Z7984 Long term (current) use of oral hypoglycemic drugs: Secondary | ICD-10-CM | POA: Insufficient documentation

## 2017-08-02 DIAGNOSIS — Z85820 Personal history of malignant melanoma of skin: Secondary | ICD-10-CM | POA: Diagnosis not present

## 2017-08-02 DIAGNOSIS — F319 Bipolar disorder, unspecified: Secondary | ICD-10-CM | POA: Diagnosis not present

## 2017-08-02 DIAGNOSIS — Z96653 Presence of artificial knee joint, bilateral: Secondary | ICD-10-CM | POA: Insufficient documentation

## 2017-08-02 DIAGNOSIS — J449 Chronic obstructive pulmonary disease, unspecified: Secondary | ICD-10-CM | POA: Diagnosis not present

## 2017-08-02 DIAGNOSIS — Z882 Allergy status to sulfonamides status: Secondary | ICD-10-CM | POA: Diagnosis not present

## 2017-08-02 HISTORY — PX: CARPAL TUNNEL RELEASE: SHX101

## 2017-08-02 LAB — GLUCOSE, CAPILLARY
GLUCOSE-CAPILLARY: 133 mg/dL — AB (ref 65–99)
GLUCOSE-CAPILLARY: 131 mg/dL — AB (ref 65–99)

## 2017-08-02 SURGERY — CARPAL TUNNEL RELEASE
Anesthesia: Monitor Anesthesia Care | Laterality: Right

## 2017-08-02 MED ORDER — ONDANSETRON HCL 4 MG/2ML IJ SOLN: 4.0000 mg | Freq: Once | INTRAMUSCULAR | Status: DC | PRN

## 2017-08-02 MED ORDER — LIDOCAINE-EPINEPHRINE 0.5 %-1:200000 IJ SOLN
INTRAMUSCULAR | Status: DC | PRN
  Administered 2017-08-02: 7 mL

## 2017-08-02 MED ORDER — MIDAZOLAM HCL 5 MG/5ML IJ SOLN
INTRAMUSCULAR | Status: DC | PRN
  Administered 2017-08-02 (×2): 1 mg via INTRAVENOUS

## 2017-08-02 MED ORDER — BACITRACIN ZINC 500 UNIT/GM EX OINT
TOPICAL_OINTMENT | CUTANEOUS | Status: DC | PRN
  Administered 2017-08-02: 1 via TOPICAL

## 2017-08-02 MED ORDER — FENTANYL CITRATE (PF) 250 MCG/5ML IJ SOLN
INTRAMUSCULAR | Status: AC
  Filled 2017-08-02: qty 5

## 2017-08-02 MED ORDER — PHENYLEPHRINE 40 MCG/ML (10ML) SYRINGE FOR IV PUSH (FOR BLOOD PRESSURE SUPPORT)
PREFILLED_SYRINGE | INTRAVENOUS | Status: DC | PRN
  Administered 2017-08-02: 60 ug via INTRAVENOUS
  Administered 2017-08-02: 40 ug via INTRAVENOUS

## 2017-08-02 MED ORDER — LACTATED RINGERS IV SOLN
INTRAVENOUS | Status: DC
Start: 2017-08-02 — End: 2017-08-02
  Administered 2017-08-02: 08:00:00 via INTRAVENOUS

## 2017-08-02 MED ORDER — PROPOFOL 10 MG/ML IV BOLUS
INTRAVENOUS | Status: DC | PRN
  Administered 2017-08-02: 20 mg via INTRAVENOUS
  Administered 2017-08-02: 100 mg via INTRAVENOUS
  Administered 2017-08-02: 50 mg via INTRAVENOUS

## 2017-08-02 MED ORDER — LIDOCAINE HCL (CARDIAC) 20 MG/ML IV SOLN
INTRAVENOUS | Status: DC | PRN
  Administered 2017-08-02: 100 mg via INTRATRACHEAL

## 2017-08-02 MED ORDER — CHLORHEXIDINE GLUCONATE CLOTH 2 % EX PADS: 6.0000 | MEDICATED_PAD | Freq: Once | CUTANEOUS | Status: DC

## 2017-08-02 MED ORDER — FENTANYL CITRATE (PF) 100 MCG/2ML IJ SOLN
INTRAMUSCULAR | Status: DC | PRN
  Administered 2017-08-02: 50 ug via INTRAVENOUS
  Administered 2017-08-02: 25 ug via INTRAVENOUS

## 2017-08-02 MED ORDER — MIDAZOLAM HCL 2 MG/2ML IJ SOLN
INTRAMUSCULAR | Status: AC
  Filled 2017-08-02: qty 2

## 2017-08-02 MED ORDER — ONDANSETRON HCL 4 MG/2ML IJ SOLN
INTRAMUSCULAR | Status: DC | PRN
  Administered 2017-08-02: 4 mg via INTRAVENOUS

## 2017-08-02 MED ORDER — BACITRACIN ZINC 500 UNIT/GM EX OINT
TOPICAL_OINTMENT | CUTANEOUS | Status: AC
  Filled 2017-08-02: qty 28.35

## 2017-08-02 MED ORDER — FENTANYL CITRATE (PF) 100 MCG/2ML IJ SOLN: 25.0000 ug | INTRAMUSCULAR | Status: DC | PRN

## 2017-08-02 MED ORDER — PROPOFOL 10 MG/ML IV BOLUS
INTRAVENOUS | Status: AC
  Filled 2017-08-02: qty 20

## 2017-08-02 MED ORDER — TRAMADOL HCL 50 MG PO TABS: 50.0000 mg | ORAL_TABLET | Freq: Four times a day (QID) | ORAL | 0 refills | Status: DC | PRN

## 2017-08-02 MED ORDER — LIDOCAINE-EPINEPHRINE 0.5 %-1:200000 IJ SOLN
INTRAMUSCULAR | Status: AC
  Filled 2017-08-02: qty 1

## 2017-08-02 SURGICAL SUPPLY — 62 items
ADH SKN CLS APL DERMABOND .7 (GAUZE/BANDAGES/DRESSINGS)
BANDAGE ACE 3X5.8 VEL STRL LF (GAUZE/BANDAGES/DRESSINGS) ×3 IMPLANT
BLADE SURG 15 STRL LF DISP TIS (BLADE) ×1 IMPLANT
BLADE SURG 15 STRL SS (BLADE) ×3
BNDG GAUZE ELAST 4 BULKY (GAUZE/BANDAGES/DRESSINGS) ×3 IMPLANT
CARTRIDGE OIL MAESTRO DRILL (MISCELLANEOUS) IMPLANT
CORD BIPOLAR FORCEPS 12FT (ELECTRODE) ×3 IMPLANT
DECANTER SPIKE VIAL GLASS SM (MISCELLANEOUS) ×3 IMPLANT
DERMABOND ADVANCED (GAUZE/BANDAGES/DRESSINGS)
DERMABOND ADVANCED .7 DNX12 (GAUZE/BANDAGES/DRESSINGS) IMPLANT
DIFFUSER DRILL AIR PNEUMATIC (MISCELLANEOUS) ×1 IMPLANT
DRAPE EXTREMITY T 121X128X90 (DRAPE) ×3 IMPLANT
DRAPE HALF SHEET 40X57 (DRAPES) IMPLANT
DURAPREP 26ML APPLICATOR (WOUND CARE) ×3 IMPLANT
GAUZE SPONGE 4X4 12PLY STRL (GAUZE/BANDAGES/DRESSINGS) ×3 IMPLANT
GAUZE SPONGE 4X4 12PLY STRL LF (GAUZE/BANDAGES/DRESSINGS) ×3 IMPLANT
GAUZE SPONGE 4X4 16PLY XRAY LF (GAUZE/BANDAGES/DRESSINGS) ×3 IMPLANT
GLOVE BIO SURGEON STRL SZ 6.5 (GLOVE) IMPLANT
GLOVE BIO SURGEON STRL SZ7 (GLOVE) IMPLANT
GLOVE BIO SURGEON STRL SZ7.5 (GLOVE) IMPLANT
GLOVE BIO SURGEON STRL SZ8 (GLOVE) IMPLANT
GLOVE BIO SURGEON STRL SZ8.5 (GLOVE) IMPLANT
GLOVE BIO SURGEONS STRL SZ 6.5 (GLOVE)
GLOVE BIOGEL M 8.0 STRL (GLOVE) IMPLANT
GLOVE ECLIPSE 6.5 STRL STRAW (GLOVE) ×3 IMPLANT
GLOVE ECLIPSE 7.0 STRL STRAW (GLOVE) IMPLANT
GLOVE ECLIPSE 7.5 STRL STRAW (GLOVE) IMPLANT
GLOVE ECLIPSE 8.0 STRL XLNG CF (GLOVE) IMPLANT
GLOVE ECLIPSE 8.5 STRL (GLOVE) IMPLANT
GLOVE EXAM NITRILE LRG STRL (GLOVE) IMPLANT
GLOVE EXAM NITRILE XL STR (GLOVE) IMPLANT
GLOVE EXAM NITRILE XS STR PU (GLOVE) IMPLANT
GLOVE INDICATOR 6.5 STRL GRN (GLOVE) IMPLANT
GLOVE INDICATOR 7.0 STRL GRN (GLOVE) IMPLANT
GLOVE INDICATOR 7.5 STRL GRN (GLOVE) IMPLANT
GLOVE INDICATOR 8.0 STRL GRN (GLOVE) IMPLANT
GLOVE INDICATOR 8.5 STRL (GLOVE) IMPLANT
GLOVE OPTIFIT SS 8.0 STRL (GLOVE) IMPLANT
GLOVE SURG SS PI 6.5 STRL IVOR (GLOVE) IMPLANT
GOWN STRL REUS W/ TWL LRG LVL3 (GOWN DISPOSABLE) ×2 IMPLANT
GOWN STRL REUS W/ TWL XL LVL3 (GOWN DISPOSABLE) IMPLANT
GOWN STRL REUS W/TWL 2XL LVL3 (GOWN DISPOSABLE) IMPLANT
GOWN STRL REUS W/TWL LRG LVL3 (GOWN DISPOSABLE) ×6
GOWN STRL REUS W/TWL XL LVL3 (GOWN DISPOSABLE)
KIT BASIN OR (CUSTOM PROCEDURE TRAY) ×3 IMPLANT
KIT ROOM TURNOVER OR (KITS) ×3 IMPLANT
NDL HYPO 25X1 1.5 SAFETY (NEEDLE) ×1 IMPLANT
NEEDLE HYPO 25X1 1.5 SAFETY (NEEDLE) ×3 IMPLANT
NS IRRIG 1000ML POUR BTL (IV SOLUTION) ×3 IMPLANT
OIL CARTRIDGE MAESTRO DRILL (MISCELLANEOUS)
PACK SURGICAL SETUP 50X90 (CUSTOM PROCEDURE TRAY) ×3 IMPLANT
PAD ARMBOARD 7.5X6 YLW CONV (MISCELLANEOUS) ×9 IMPLANT
STOCKINETTE 4X48 STRL (DRAPES) ×3 IMPLANT
SUT ETHILON 3 0 PS 1 (SUTURE) ×3 IMPLANT
SYR BULB 3OZ (MISCELLANEOUS) ×3 IMPLANT
SYR CONTROL 10ML LL (SYRINGE) ×3 IMPLANT
TOWEL GREEN STERILE (TOWEL DISPOSABLE) ×3 IMPLANT
TOWEL GREEN STERILE FF (TOWEL DISPOSABLE) ×3 IMPLANT
TUBE CONNECTING 12'X1/4 (SUCTIONS) ×1
TUBE CONNECTING 12X1/4 (SUCTIONS) ×2 IMPLANT
UNDERPAD 30X30 (UNDERPADS AND DIAPERS) ×3 IMPLANT
WATER STERILE IRR 1000ML POUR (IV SOLUTION) IMPLANT

## 2017-08-02 NOTE — H&P (Signed)
[name] is an 61 y.o. female.   Chief Complaint: right carpal tunnel syndrome HPI: emg proven right carpal tunnel syndrome, no pain relief  Past Medical History:  Diagnosis Date  . Acromegaly ([place])   . Anxiety   . Arthritis   . Bacterial infection   . Benign paroxysmal positional vertigo 02/24/2013  . Bipolar disorder ([place])   . Bladder infection   . Cerebral palsy ([place])   . Colitis   . Depression    bi polar  . Diabetes mellitus, type II ([employer])   . Dysplasia of cervix   . GERD (gastroesophageal reflux disease)   . Headache(784.0)   . High cholesterol   . History of chicken pox   . History of measles   . History of mumps   . IBS (irritable bowel syndrome)   . Kidney infection   . Melanoma in situ ([place])   . Neck mass   . Neuromuscular disorder ([place])   . Ovarian cyst   . Stenosis, cervical spine 05/16  . Trichomonas   . UTI (urinary tract infection)    current  . Yeast infection     Past Surgical History:  Procedure Laterality Date  . ANTERIOR CERVICAL DECOMP/DISCECTOMY FUSION N/A 05/25/2016   Procedure: Cervical five-six Anterior cervical decompression/diskectomy/fusion;  Surgeon: Ashok Pall, MD;  Location: [place] [facility]URO ORS;  Service: Neurosurgery;  Laterality: N/A;  . APPENDECTOMY    . CARPAL TUNNEL RELEASE    . COLONOSCOPY WITH PROPOFOL N/A 11/29/2016   Procedure: COLONOSCOPY WITH PROPOFOL;  Surgeon: [name], MD;  Location: [facility] ENDOSCOPY;  Service: Endoscopy;  Laterality: N/A;  . ESOPHAGOGASTRODUODENOSCOPY (EGD) WITH PROPOFOL N/A 11/29/2016   Procedure: ESOPHAGOGASTRODUODENOSCOPY (EGD) WITH PROPOFOL;  Surgeon: [name], MD;  Location: [facility] ENDOSCOPY;  Service: Endoscopy;  Laterality: N/A;  . JOINT REPLACEMENT    . knee rotation    . LEG TENDON SURGERY    . OVARIAN CYST REMOVAL    . REPLACEMENT TOTAL KNEE BILATERAL  2006  . rotator cuff surgery      Family History  Problem Relation Age of Onset  . Suicidality Father   . Depression Father   .  Alcohol abuse Father   . Emphysema Father        smoked  . Drug abuse Brother   . Alcohol abuse Brother   . Esophageal cancer Brother   . Depression Maternal Aunt   . Alcohol abuse Brother   . Anxiety disorder Daughter   . Depression Daughter   . Suicidality Daughter   . Alcohol abuse Daughter   . Drug abuse Son   . Alcohol abuse Son   . Emphysema Sister        smoked   Social History:  reports that she quit smoking about 17 years ago. Her smoking use included Cigarettes. She has a 31.00 pack-year smoking history. She has never used smokeless tobacco. She reports that she does not drink alcohol or use drugs.  Allergies:  Allergies  Allergen Reactions  . Miconazole Nitrate Hives and Itching  . Sulfonamide Derivatives Hives and Itching    Medications Prior to Admission  Medication Sig Dispense Refill  . amitriptyline (ELAVIL) 50 MG tablet Take 1 tablet (50 mg total) by mouth at bedtime. 90 tablet 0  . Ascorbic Acid (VITAMIN C) 500 MG CAPS Take 500 mg by mouth daily.     . ASTRAGALUS PO Take 1 capsule by mouth 2 (two) times daily.    . busPIRone (BUSPAR)  10 MG tablet TAKE 1 TABLET(10 MG) BY MOUTH TWICE DAILY 180 tablet 0  . Calcium Carb-Cholecalciferol (CALCIUM + D3) 600-800 MG-UNIT TABS Take 1 tablet by mouth daily.    . cephALEXin (KEFLEX) 250 MG capsule Take 250 mg by mouth at bedtime. 21 days left as of 07/30/17  6  . Cholecalciferol (VITAMIN D-3) 1000 units CAPS Take 2,000 Units by mouth daily.     . fluticasone (FLONASE) 50 MCG/ACT nasal spray Place 1 spray into both nostrils 2 (two) times daily.   1  . gabapentin (NEURONTIN) 300 MG capsule Take 300 mg by mouth 2 (two) times daily.    [name] lamoTRIgine (LAMICTAL) 150 MG tablet Take 1 tablet (150 mg total) by mouth daily. 90 tablet 0  . MELATONIN PO Take 10 mg by mouth at bedtime.     . metFORMIN (GLUCOPHAGE-XR) 500 MG 24 hr tablet Take 500 mg by mouth daily with breakfast.    . methenamine (HIPREX) 1 g tablet Take 1 g by mouth  daily.    . Multiple Vitamins-Minerals (MULTIVITAMIN GUMMIES ADULT PO) Take 2 tablets by mouth daily.    [name] MYRBETRIQ 25 MG TB24 tablet Take 25 mg by mouth daily.     [name] PROAIR HFA 108 (90 BASE) MCG/ACT inhaler Inhale 1 puff daily as needed for wheezing or shortness of breath  11  . ranitidine (ZANTAC) 300 MG tablet Take 300 mg by mouth daily as needed for heartburn.     [name] RAPAFLO 8 MG CAPS capsule Take 8 mg by mouth daily with breakfast.   11  . simvastatin (ZOCOR) 40 MG tablet Take 40 mg by mouth daily.    [name] spironolactone (ALDACTONE) 100 MG tablet Take 100 mg by mouth daily.   5  . tamsulosin (FLOMAX) 0.4 MG CAPS capsule Take 0.4 mg by mouth at bedtime.  11  . traMADol (ULTRAM) 50 MG tablet Take 1 tablet (50 mg total) by mouth every 6 (six) hours as needed. (Patient taking differently: Take 50 mg by mouth every 6 (six) hours as needed for moderate pain. ) 60 tablet 5  . triamterene-hydrochlorothiazide (MAXZIDE) 75-50 MG tablet Take 1 tablet by mouth daily.  3  . cyclobenzaprine (FLEXERIL) 5 MG tablet Take 1-2 tablets (5-10 mg total) by mouth 3 (three) times daily as needed for muscle spasms. (Patient not taking: Reported on 07/30/2017) 30 tablet 3    Results for orders placed or performed during the hospital encounter of 08/02/17 (from the past 48 hour(s))  Glucose, capillary     Status: Abnormal   Collection Time: 08/02/17  7:25 AM  Result Value Ref Range   Glucose-Capillary 133 (H) 65 - 99 mg/dL   Comment 1 Notify RN    No results found.  Review of Systems  HENT: Negative.   Eyes: Negative.   Respiratory: Negative.   Cardiovascular: Negative.   Gastrointestinal: Negative.   Genitourinary: Negative.   Musculoskeletal: Negative.   Skin: Negative.   Neurological: Positive for weakness.  Endo/Heme/Allergies: Negative.   Psychiatric/Behavioral: Negative.     Blood pressure 108/68, pulse 100, temperature 98.3 F (36.8 C), temperature source Oral, resp. rate 20, height 5\' 7"  (1.702  m), weight 90.7 kg (200 lb), SpO2 100 %. Physical Exam  Constitutional: She is oriented to person, place, and time. She appears well-developed and well-nourished. She appears lethargic. No distress.  HENT:  Head: Normocephalic and atraumatic.  Eyes: Pupils are equal, round, and reactive to light. Conjunctivae and EOM are normal.  Neck:  Normal range of motion. Neck supple.  Cardiovascular: Normal rate and regular rhythm.   Respiratory: Effort normal and breath sounds normal.  GI: Soft. Bowel sounds are normal.  Neurological: She is oriented to person, place, and time. She appears lethargic. No cranial nerve deficit.  Plegic in lower extremities     Assessment/Plan OR for carpal tunnel release  Shonika Kolasinski L, MD 08/02/2017, 9:00 AM

## 2017-08-02 NOTE — Discharge Instructions (Signed)

## 2017-08-02 NOTE — Transfer of Care (Signed)
Immediate Anesthesia Transfer of Care Note  Patient: [name]  Procedure(s) Performed: CARPAL TUNNEL RELEASE RIGHT (Right )  Patient Location: PACU  Anesthesia Type:General  Level of Consciousness: awake, alert , oriented and patient cooperative  Airway & Oxygen Therapy: Patient Spontanous Breathing and Patient connected to nasal cannula oxygen  Post-op Assessment: Report given to RN, Post -op Vital signs reviewed and stable and Patient moving all extremities X 4  Post vital signs: Reviewed and stable  Last Vitals:  Vitals:   08/02/17 0714  BP: 108/68  Pulse: 100  Resp: 20  Temp: 36.8 C  SpO2: 100%    Last Pain:  Vitals:   08/02/17 0714  TempSrc: Oral         Complications: No apparent anesthesia complications

## 2017-08-02 NOTE — Op Note (Signed)
   9:58 AM  PATIENT:  [name]  61 y.o. female  PRE-OPERATIVE DIAGNOSIS:  Right  Carpal Tunnel Syndrome  POST-OPERATIVE DIAGNOSIS:  right Carpal Tunnel Syndrome  PROCEDURE:  Procedure(s): CARPAL TUNNEL RELEASE RIGHT  SURGEON: Surgeon(s): Ashok Pall, MD  ANESTHESIA:   local and general  EBL:  Total I/O In: 500 [I.V.:500] Out: 305 [Urine:300; Blood:5]  COUNT:per nursing  DICTATION: ALANE HANSSEN was taken to the operating room, given IV sedation, and positioned on the operating room table. female had their right upper extremity prepped and draped in a sterile manner. I infiltrated Licod[place]  4/1% 3/244,010 strength epinephrine into the planned incision starting at the proximal palmar crease extending into the hand ~ 1.5cm. I opened the skin with a 15 blade and extended the incision through the skin into the subcutaneous tissue. I used the bipolar cautery to control the subcutaneous bleeding. I dissected sharply through the tissue using forceps also to expose the transverse carpal ligament. I dived the transverse carpal ligament sharply with the 15 blade using the forceps to protect the contents of the carpal tunnel. With the scissors I divided the ligament both proximally and distally to decompress the entire carpal tunnel. I used the scissors to dissect into the forearm to create space to divide the ligament to the proximal palmar crease, and distally into the palm.  I irrigated the wound then closed the incision with vertical interrupted vertical mattress sutures. I placed a sterile dressing, then wrapped the proximal hand and distal forearm with an ace wrap.  PLAN OF CARE: Discharge to home after PACU  PATIENT DISPOSITION:  PACU - hemodynamically stable.   Delay start of Pharmacological VTE agent (>24hrs) due to surgical blood loss or risk of bleeding:  yes

## 2017-08-02 NOTE — Anesthesia Procedure Notes (Signed)
Procedure Name: LMA Insertion Date/Time: 08/02/2017 9:18 AM Performed by: Julieta Bellini Pre-anesthesia Checklist: Patient identified, Emergency Drugs available, Suction available and Patient being monitored Patient Re-evaluated:Patient Re-evaluated prior to induction Oxygen Delivery Method: Circle system utilized Preoxygenation: Pre-oxygenation with 100% oxygen Induction Type: IV induction Ventilation: Mask ventilation without difficulty LMA: LMA inserted LMA Size: 4.0 Number of attempts: 1 Placement Confirmation: positive ETCO2 and breath sounds checked- equal and bilateral Tube secured with: Tape Dental Injury: Teeth and Oropharynx as per pre-operative assessment

## 2017-08-02 NOTE — Anesthesia Postprocedure Evaluation (Signed)
Anesthesia Post Note  Patient: [name]  Procedure(s) Performed: CARPAL TUNNEL RELEASE RIGHT (Right )     Patient location during evaluation: PACU Anesthesia Type: General Level of consciousness: awake and alert Pain management: pain level controlled Vital Signs Assessment: post-procedure vital signs reviewed and stable Respiratory status: spontaneous breathing, nonlabored ventilation and respiratory function stable Cardiovascular status: blood pressure returned to baseline and stable Postop Assessment: no apparent nausea or vomiting Anesthetic complications: no    Last Vitals:  Vitals:   08/02/17 1013 08/02/17 1020  BP: 123/72 128/70  Pulse: 85 85  Resp: 12 15  Temp: 36.4 C   SpO2: 96% 98%    Last Pain:  Vitals:   08/02/17 1011  TempSrc:   PainSc: 0-No pain                 Catalina Gravel

## 2017-08-03 ENCOUNTER — Encounter (HOSPITAL_COMMUNITY): Payer: Self-pay | Admitting: Neurosurgery

## 2017-09-12 DIAGNOSIS — R3914 Feeling of incomplete bladder emptying: Secondary | ICD-10-CM | POA: Diagnosis not present

## 2017-09-12 DIAGNOSIS — R8279 Other abnormal findings on microbiological examination of urine: Secondary | ICD-10-CM | POA: Diagnosis not present

## 2017-09-13 ENCOUNTER — Other Ambulatory Visit: Payer: Self-pay | Admitting: Pharmacist

## 2017-09-13 NOTE — Patient Outreach (Signed)
Outreach call to [name] regarding her request for follow up from the [facility] Medication Adherence Campaign. Left a HIPAA compliant message on the patient's voicemail.   Harlow Asa, PharmD, [facility] [phone]

## 2017-10-04 DIAGNOSIS — Z1231 Encounter for screening mammogram for malignant neoplasm of breast: Secondary | ICD-10-CM | POA: Diagnosis not present

## 2017-10-09 ENCOUNTER — Encounter (HOSPITAL_COMMUNITY): Payer: Self-pay | Admitting: Psychiatry

## 2017-10-09 ENCOUNTER — Ambulatory Visit (INDEPENDENT_AMBULATORY_CARE_PROVIDER_SITE_OTHER): Payer: Medicare Other | Admitting: Psychiatry

## 2017-10-09 DIAGNOSIS — Z818 Family history of other mental and behavioral disorders: Secondary | ICD-10-CM | POA: Diagnosis not present

## 2017-10-09 DIAGNOSIS — Z813 Family history of other psychoactive substance abuse and dependence: Secondary | ICD-10-CM | POA: Diagnosis not present

## 2017-10-09 DIAGNOSIS — M542 Cervicalgia: Secondary | ICD-10-CM | POA: Diagnosis not present

## 2017-10-09 DIAGNOSIS — F419 Anxiety disorder, unspecified: Secondary | ICD-10-CM | POA: Diagnosis not present

## 2017-10-09 DIAGNOSIS — Z811 Family history of alcohol abuse and dependence: Secondary | ICD-10-CM

## 2017-10-09 DIAGNOSIS — Z87891 Personal history of nicotine dependence: Secondary | ICD-10-CM

## 2017-10-09 DIAGNOSIS — F319 Bipolar disorder, unspecified: Secondary | ICD-10-CM

## 2017-10-09 MED ORDER — BUSPIRONE HCL 10 MG PO TABS: ORAL_TABLET | ORAL | 0 refills | Status: DC

## 2017-10-09 MED ORDER — LAMOTRIGINE 150 MG PO TABS: 150.0000 mg | ORAL_TABLET | Freq: Every day | ORAL | 0 refills | Status: DC

## 2017-10-09 MED ORDER — AMITRIPTYLINE HCL 50 MG PO TABS: 50.0000 mg | ORAL_TABLET | Freq: Every day | ORAL | 0 refills | Status: DC
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
## 2017-11-04 DIAGNOSIS — L814 Other melanin hyperpigmentation: Secondary | ICD-10-CM | POA: Diagnosis not present

## 2017-11-04 DIAGNOSIS — D1801 Hemangioma of skin and subcutaneous tissue: Secondary | ICD-10-CM | POA: Diagnosis not present

## 2017-11-04 DIAGNOSIS — L821 Other seborrheic keratosis: Secondary | ICD-10-CM | POA: Diagnosis not present

## 2017-11-05 ENCOUNTER — Other Ambulatory Visit: Payer: Self-pay | Admitting: Neurosurgery

## 2017-11-08 DIAGNOSIS — N3020 Other chronic cystitis without hematuria: Secondary | ICD-10-CM | POA: Diagnosis not present

## 2017-11-08 DIAGNOSIS — R3914 Feeling of incomplete bladder emptying: Secondary | ICD-10-CM | POA: Diagnosis not present

## 2017-11-28 ENCOUNTER — Other Ambulatory Visit: Payer: Self-pay

## 2017-11-28 ENCOUNTER — Encounter (HOSPITAL_COMMUNITY): Payer: Self-pay | Admitting: *Deleted

## 2017-11-28 NOTE — Progress Notes (Signed)
Spoke with pt for pre-op call. Pt denies cardiac history. Does have Cerebral Palsy and is non ambulatory. Pt is a type 2 diabetic. Last A1C was 6.6 on 07/30/17. She states her fasting blood sugar is usually between 120-130. Pt instructed not to take Metformin in the AM. Instructed pt to check her blood sugar in the AM when she gets up and every 2 hours until she leaves for the hospital. If blood sugar is 70 or below, treat with 1/2 cup of clear juice (apple or cranberry) and recheck blood sugar 15 minutes after drinking juice. If blood sugar continues to be 70 or below, call the Short Stay department and ask to speak to a nurse.

## 2017-11-29 ENCOUNTER — Encounter (HOSPITAL_COMMUNITY)
Admission: RE | Disposition: A | Discharge status: Home / Self Care | Payer: Self-pay | Source: Ambulatory Visit | Attending: Neurosurgery

## 2017-11-29 ENCOUNTER — Other Ambulatory Visit: Payer: Self-pay

## 2017-11-29 ENCOUNTER — Ambulatory Visit (HOSPITAL_COMMUNITY): Payer: Medicare Other | Admitting: Anesthesiology

## 2017-11-29 ENCOUNTER — Ambulatory Visit (HOSPITAL_COMMUNITY)
Admission: RE | Admit: 2017-11-29 | Discharge: 2017-11-29 | Disposition: A | Discharge status: Home / Self Care | Payer: Medicare Other | Source: Ambulatory Visit | Attending: Neurosurgery | Admitting: Neurosurgery

## 2017-11-29 ENCOUNTER — Encounter (HOSPITAL_COMMUNITY): Payer: Self-pay | Admitting: *Deleted

## 2017-11-29 DIAGNOSIS — Z85828 Personal history of other malignant neoplasm of skin: Secondary | ICD-10-CM | POA: Insufficient documentation

## 2017-11-29 DIAGNOSIS — J449 Chronic obstructive pulmonary disease, unspecified: Secondary | ICD-10-CM | POA: Insufficient documentation

## 2017-11-29 DIAGNOSIS — G5603 Carpal tunnel syndrome, bilateral upper limbs: Secondary | ICD-10-CM | POA: Diagnosis not present

## 2017-11-29 DIAGNOSIS — F319 Bipolar disorder, unspecified: Secondary | ICD-10-CM | POA: Insufficient documentation

## 2017-11-29 DIAGNOSIS — Z87891 Personal history of nicotine dependence: Secondary | ICD-10-CM | POA: Insufficient documentation

## 2017-11-29 DIAGNOSIS — G5602 Carpal tunnel syndrome, left upper limb: Secondary | ICD-10-CM | POA: Insufficient documentation

## 2017-11-29 DIAGNOSIS — E119 Type 2 diabetes mellitus without complications: Secondary | ICD-10-CM | POA: Diagnosis not present

## 2017-11-29 DIAGNOSIS — E785 Hyperlipidemia, unspecified: Secondary | ICD-10-CM | POA: Diagnosis not present

## 2017-11-29 DIAGNOSIS — K589 Irritable bowel syndrome without diarrhea: Secondary | ICD-10-CM | POA: Diagnosis not present

## 2017-11-29 DIAGNOSIS — H8110 Benign paroxysmal vertigo, unspecified ear: Secondary | ICD-10-CM | POA: Diagnosis not present

## 2017-11-29 DIAGNOSIS — K219 Gastro-esophageal reflux disease without esophagitis: Secondary | ICD-10-CM | POA: Diagnosis not present

## 2017-11-29 DIAGNOSIS — G809 Cerebral palsy, unspecified: Secondary | ICD-10-CM | POA: Insufficient documentation

## 2017-11-29 DIAGNOSIS — F419 Anxiety disorder, unspecified: Secondary | ICD-10-CM | POA: Insufficient documentation

## 2017-11-29 DIAGNOSIS — E7800 Pure hypercholesterolemia, unspecified: Secondary | ICD-10-CM | POA: Diagnosis not present

## 2017-11-29 DIAGNOSIS — Z882 Allergy status to sulfonamides status: Secondary | ICD-10-CM | POA: Insufficient documentation

## 2017-11-29 HISTORY — DX: Chronic obstructive pulmonary disease, unspecified: J44.9

## 2017-11-29 HISTORY — PX: CARPAL TUNNEL RELEASE: SHX101

## 2017-11-29 HISTORY — DX: Other specified symptoms and signs involving the circulatory and respiratory systems: R09.89

## 2017-11-29 LAB — BASIC METABOLIC PANEL
ANION GAP: 13 (ref 5–15)
BUN: 15 mg/dL (ref 6–20)
CALCIUM: 9.7 mg/dL (ref 8.9–10.3)
CHLORIDE: 99 mmol/L — AB (ref 101–111)
CO2: 23 mmol/L (ref 22–32)
CREATININE: 0.94 mg/dL (ref 0.44–1.00)
GFR calc non Af Amer: >60 mL/min (ref >60)
Glucose, Bld: 143 mg/dL — ABNORMAL HIGH (ref 65–99)
Potassium: 4.7 mmol/L (ref 3.5–5.1)
SODIUM: 135 mmol/L (ref 135–145)

## 2017-11-29 LAB — HEMOGLOBIN A1C
Hgb A1c MFr Bld: 7.2 % — ABNORMAL HIGH (ref 4.8–5.6)
Mean Plasma Glucose: 159.94 mg/dL

## 2017-11-29 LAB — HEMOGLOBIN: Hemoglobin: 15.6 g/dL — ABNORMAL HIGH (ref 12.0–15.0)

## 2017-11-29 LAB — GLUCOSE, CAPILLARY
GLUCOSE-CAPILLARY: 136 mg/dL — AB (ref 65–99)
Glucose-Capillary: 126 mg/dL — ABNORMAL HIGH (ref 65–99)

## 2017-11-29 SURGERY — CARPAL TUNNEL RELEASE
Anesthesia: Monitor Anesthesia Care | Laterality: Left

## 2017-11-29 MED ORDER — 0.9 % SODIUM CHLORIDE (POUR BTL) OPTIME
TOPICAL | Status: DC | PRN
  Administered 2017-11-29: 1000 mL

## 2017-11-29 MED ORDER — FENTANYL CITRATE (PF) 250 MCG/5ML IJ SOLN
INTRAMUSCULAR | Status: DC | PRN
  Administered 2017-11-29: 50 ug via INTRAVENOUS
  Administered 2017-11-29: 25 ug via INTRAVENOUS
  Administered 2017-11-29: 50 ug via INTRAVENOUS
  Administered 2017-11-29 (×2): 25 ug via INTRAVENOUS

## 2017-11-29 MED ORDER — PROPOFOL 10 MG/ML IV BOLUS
INTRAVENOUS | Status: DC | PRN
  Administered 2017-11-29: 30 mg via INTRAVENOUS

## 2017-11-29 MED ORDER — BACITRACIN ZINC 500 UNIT/GM EX OINT
TOPICAL_OINTMENT | CUTANEOUS | Status: AC
  Filled 2017-11-29: qty 28.35

## 2017-11-29 MED ORDER — CHLORHEXIDINE GLUCONATE CLOTH 2 % EX PADS: 6.0000 | MEDICATED_PAD | Freq: Once | CUTANEOUS | Status: DC

## 2017-11-29 MED ORDER — ONDANSETRON HCL 4 MG/2ML IJ SOLN: 4.0000 mg | Freq: Once | INTRAMUSCULAR | Status: DC | PRN

## 2017-11-29 MED ORDER — PROPOFOL 10 MG/ML IV BOLUS
INTRAVENOUS | Status: AC
  Filled 2017-11-29: qty 20

## 2017-11-29 MED ORDER — MIDAZOLAM HCL 5 MG/5ML IJ SOLN
INTRAMUSCULAR | Status: DC | PRN
  Administered 2017-11-29: 2 mg via INTRAVENOUS

## 2017-11-29 MED ORDER — MIDAZOLAM HCL 2 MG/2ML IJ SOLN
INTRAMUSCULAR | Status: AC
  Filled 2017-11-29: qty 2

## 2017-11-29 MED ORDER — FENTANYL CITRATE (PF) 250 MCG/5ML IJ SOLN
INTRAMUSCULAR | Status: AC
Start: 2017-11-29 — End: 2017-11-29
  Filled 2017-11-29: qty 5

## 2017-11-29 MED ORDER — TRAMADOL HCL 50 MG PO TABS: 50.0000 mg | ORAL_TABLET | Freq: Four times a day (QID) | ORAL | 0 refills | Status: DC | PRN

## 2017-11-29 MED ORDER — ONDANSETRON HCL 4 MG/2ML IJ SOLN
INTRAMUSCULAR | Status: DC | PRN
  Administered 2017-11-29: 4 mg via INTRAVENOUS

## 2017-11-29 MED ORDER — PROPOFOL 500 MG/50ML IV EMUL
INTRAVENOUS | Status: DC | PRN
  Administered 2017-11-29: 50 ug/kg/min via INTRAVENOUS

## 2017-11-29 MED ORDER — FENTANYL CITRATE (PF) 100 MCG/2ML IJ SOLN
INTRAMUSCULAR | Status: AC
  Administered 2017-11-29: 50 ug via INTRAVENOUS
  Filled 2017-11-29: qty 2

## 2017-11-29 MED ORDER — LIDOCAINE-EPINEPHRINE 0.5 %-1:200000 IJ SOLN
INTRAMUSCULAR | Status: AC
  Filled 2017-11-29: qty 1

## 2017-11-29 MED ORDER — CEFAZOLIN SODIUM-DEXTROSE 2-4 GM/100ML-% IV SOLN
2.0000 g | INTRAVENOUS | Status: AC
  Administered 2017-11-29: 2 g via INTRAVENOUS

## 2017-11-29 MED ORDER — LACTATED RINGERS IV SOLN
INTRAVENOUS | Status: DC
  Administered 2017-11-29: 10:00:00 via INTRAVENOUS

## 2017-11-29 MED ORDER — FENTANYL CITRATE (PF) 100 MCG/2ML IJ SOLN
25.0000 ug | INTRAMUSCULAR | Status: DC | PRN
  Administered 2017-11-29 (×2): 50 ug via INTRAVENOUS

## 2017-11-29 MED ORDER — LIDOCAINE-EPINEPHRINE 0.5 %-1:200000 IJ SOLN
INTRAMUSCULAR | Status: DC | PRN
  Administered 2017-11-29: 6 mL
  Administered 2017-11-29: 4 mL

## 2017-11-29 MED ORDER — CEFAZOLIN SODIUM-DEXTROSE 2-4 GM/100ML-% IV SOLN
INTRAVENOUS | Status: AC
  Filled 2017-11-29: qty 100

## 2017-11-29 MED ORDER — LIDOCAINE HCL (CARDIAC) 20 MG/ML IV SOLN
INTRAVENOUS | Status: DC | PRN
  Administered 2017-11-29: 60 mg via INTRATRACHEAL

## 2017-11-29 MED ORDER — BACITRACIN ZINC 500 UNIT/GM EX OINT
TOPICAL_OINTMENT | CUTANEOUS | Status: DC | PRN
  Administered 2017-11-29: 1 via TOPICAL

## 2017-11-29 SURGICAL SUPPLY — 67 items
ADH SKN CLS APL DERMABOND .7 (GAUZE/BANDAGES/DRESSINGS)
BANDAGE ACE 3X5.8 VEL STRL LF (GAUZE/BANDAGES/DRESSINGS) ×3 IMPLANT
BLADE SURG 15 STRL LF DISP TIS (BLADE) ×2 IMPLANT
BLADE SURG 15 STRL SS (BLADE) ×6
BNDG CMPR 75X41 PLY ABS (GAUZE/BANDAGES/DRESSINGS) ×1
BNDG GAUZE ELAST 4 BULKY (GAUZE/BANDAGES/DRESSINGS) ×3 IMPLANT
BNDG STRETCH 4X75 NS LF (GAUZE/BANDAGES/DRESSINGS) ×3 IMPLANT
CABLE BIPOLOR RESECTION CORD (MISCELLANEOUS) ×3 IMPLANT
CARTRIDGE OIL MAESTRO DRILL (MISCELLANEOUS) ×1 IMPLANT
DECANTER SPIKE VIAL GLASS SM (MISCELLANEOUS) ×3 IMPLANT
DERMABOND ADVANCED (GAUZE/BANDAGES/DRESSINGS)
DERMABOND ADVANCED .7 DNX12 (GAUZE/BANDAGES/DRESSINGS) IMPLANT
DIFFUSER DRILL AIR PNEUMATIC (MISCELLANEOUS) ×3 IMPLANT
DRAPE EXTREMITY T 121X128X90 (DRAPE) ×3 IMPLANT
DRAPE HALF SHEET 40X57 (DRAPES) ×3 IMPLANT
DURAPREP 26ML APPLICATOR (WOUND CARE) ×3 IMPLANT
GAUZE SPONGE 4X4 12PLY STRL (GAUZE/BANDAGES/DRESSINGS) ×3 IMPLANT
GAUZE SPONGE 4X4 16PLY XRAY LF (GAUZE/BANDAGES/DRESSINGS) ×3 IMPLANT
GLOVE BIO SURGEON STRL SZ 6.5 (GLOVE) IMPLANT
GLOVE BIO SURGEON STRL SZ7 (GLOVE) IMPLANT
GLOVE BIO SURGEON STRL SZ7.5 (GLOVE) IMPLANT
GLOVE BIO SURGEON STRL SZ8 (GLOVE) IMPLANT
GLOVE BIO SURGEON STRL SZ8.5 (GLOVE) IMPLANT
GLOVE BIO SURGEONS STRL SZ 6.5 (GLOVE)
GLOVE BIOGEL M 8.0 STRL (GLOVE) IMPLANT
GLOVE BIOGEL PI IND STRL 7.0 (GLOVE) IMPLANT
GLOVE BIOGEL PI IND STRL 7.5 (GLOVE) ×4 IMPLANT
GLOVE BIOGEL PI INDICATOR 7.0 (GLOVE) ×2
GLOVE BIOGEL PI INDICATOR 7.5 (GLOVE) ×8
GLOVE ECLIPSE 6.5 STRL STRAW (GLOVE) ×3 IMPLANT
GLOVE ECLIPSE 7.0 STRL STRAW (GLOVE) IMPLANT
GLOVE ECLIPSE 7.5 STRL STRAW (GLOVE) IMPLANT
GLOVE ECLIPSE 8.0 STRL XLNG CF (GLOVE) IMPLANT
GLOVE ECLIPSE 8.5 STRL (GLOVE) IMPLANT
GLOVE EXAM NITRILE LRG STRL (GLOVE) IMPLANT
GLOVE EXAM NITRILE XL STR (GLOVE) IMPLANT
GLOVE EXAM NITRILE XS STR PU (GLOVE) IMPLANT
GLOVE INDICATOR 6.5 STRL GRN (GLOVE) IMPLANT
GLOVE INDICATOR 7.0 STRL GRN (GLOVE) IMPLANT
GLOVE INDICATOR 7.5 STRL GRN (GLOVE) IMPLANT
GLOVE INDICATOR 8.0 STRL GRN (GLOVE) IMPLANT
GLOVE INDICATOR 8.5 STRL (GLOVE) IMPLANT
GLOVE OPTIFIT SS 8.0 STRL (GLOVE) IMPLANT
GLOVE SURG SS PI 6.5 STRL IVOR (GLOVE) ×2 IMPLANT
GOWN STRL REUS W/ TWL LRG LVL3 (GOWN DISPOSABLE) ×2 IMPLANT
GOWN STRL REUS W/ TWL XL LVL3 (GOWN DISPOSABLE) IMPLANT
GOWN STRL REUS W/TWL 2XL LVL3 (GOWN DISPOSABLE) IMPLANT
GOWN STRL REUS W/TWL LRG LVL3 (GOWN DISPOSABLE) ×6
GOWN STRL REUS W/TWL XL LVL3 (GOWN DISPOSABLE)
KIT BASIN OR (CUSTOM PROCEDURE TRAY) ×3 IMPLANT
KIT ROOM TURNOVER OR (KITS) ×3 IMPLANT
NDL HYPO 25X1 1.5 SAFETY (NEEDLE) ×1 IMPLANT
NEEDLE HYPO 25X1 1.5 SAFETY (NEEDLE) ×3 IMPLANT
NS IRRIG 1000ML POUR BTL (IV SOLUTION) ×3 IMPLANT
OIL CARTRIDGE MAESTRO DRILL (MISCELLANEOUS) ×3
PACK SURGICAL SETUP 50X90 (CUSTOM PROCEDURE TRAY) ×3 IMPLANT
PAD ARMBOARD 7.5X6 YLW CONV (MISCELLANEOUS) ×9 IMPLANT
STOCKINETTE 4X48 STRL (DRAPES) ×3 IMPLANT
SUT ETHILON 3 0 PS 1 (SUTURE) ×3 IMPLANT
SYR BULB 3OZ (MISCELLANEOUS) ×3 IMPLANT
SYR CONTROL 10ML LL (SYRINGE) ×3 IMPLANT
TOWEL GREEN STERILE (TOWEL DISPOSABLE) ×3 IMPLANT
TOWEL GREEN STERILE FF (TOWEL DISPOSABLE) ×3 IMPLANT
TUBE CONNECTING 12'X1/4 (SUCTIONS) ×1
TUBE CONNECTING 12X1/4 (SUCTIONS) ×2 IMPLANT
UNDERPAD 30X30 (UNDERPADS AND DIAPERS) ×3 IMPLANT
WATER STERILE IRR 1000ML POUR (IV SOLUTION) ×3 IMPLANT

## 2017-11-29 NOTE — Anesthesia Postprocedure Evaluation (Signed)
Anesthesia Post Note  Patient: [name]  Procedure(s) Performed: CARPAL TUNNEL RELEASE LEFT (Left )     Patient location during evaluation: PACU Anesthesia Type: MAC Level of consciousness: awake and alert Pain management: pain level controlled Vital Signs Assessment: post-procedure vital signs reviewed and stable Respiratory status: spontaneous breathing and respiratory function stable Cardiovascular status: stable Postop Assessment: no apparent nausea or vomiting Anesthetic complications: no    Last Vitals:  Vitals:   11/29/17 1545 11/29/17 1555  BP:  125/73  Pulse:  100  Resp:  20  Temp:  36.6 C  SpO2: 94% 95%    Last Pain:  Vitals:   11/29/17 1545  TempSrc:   PainSc: 3                  Gaither Biehn DANIEL

## 2017-11-29 NOTE — H&P (Signed)
BP 138/83   Pulse 100   Temp 97.9 F (36.6 C) (Oral)   Resp 20   Ht 5\' 7"  (1.702 m)   Wt 90.7 kg (200 lb)   SpO2 99%   BMI 31.32 kg/m  Past medical history also includes lung disease, colitis, diabetes. The knee replacements occurred in 02/2006.   Medications and Allergies: SHE HAS ALLERGIES TO SULFA MEDICATIONS AND TO MONISTAT. BOTH CAUSE ITCHING AND BURNING. Medications are as follows: Myrbetriq, ProAir, Metformin, Lamotrigine, Spironolactone, Atorvastatin, Xenical, Vitamin D3, Triamterine, Gelatin, Elavil, Vitamin C, Tizanidine, Melatonin, Fluticasone, Oxybutynin, Buspirone, and Alendronate. PHYSICAL EXAMINATION: On very limited physical exam she has full strength in the upper extremities. Reflexes are brisk, 2 to 3+ at the biceps and triceps. She did have bilateral Hoffman sign. Proprioception was intact bilaterally. She has good adductors in the right lower extremity, very weak in the left lower extremity. Hip flexors the same.  Pupils equal, round and reactive to light. Full extraocular movements, full visual fields. Hearing intact to voice. Uvula elevates in the midline. Shoulder shrug is normal. Tongues protrudes in the midline. I did not assess her gait. Obviously, I did not assess her Romberg. She has  left carpal tunnel syndrome, and has undergone a right carpal tunnel release previously.  Allergies  Allergen Reactions  . Miconazole Nitrate Hives and Itching  . Sulfonamide Derivatives Hives and Itching   Past Medical History:  Diagnosis Date  . Acromegaly ([place])   . Anxiety   . Arthritis   . Bacterial infection   . Benign paroxysmal positional vertigo 02/24/2013  . Bipolar disorder ([place])   . Bladder infection   . Cancer ([place])    skin cancer  . Cerebral palsy ([place])   . Colitis   . COPD (chronic obstructive pulmonary disease) (HCC)    mild emphysema  . Depression    bi polar  . Diabetes mellitus, type II ([place])   . Dysplasia of cervix   . GERD (gastroesophageal reflux  disease)   . Headache(784.0)   . High cholesterol   . History of chicken pox   . History of measles   . History of mumps   . IBS (irritable bowel syndrome)   . Kidney infection   . Melanoma in situ ([place])   . Neck mass   . Neuromuscular disorder ([place])   . Ovarian cyst   . Poor circulation   . Stenosis, cervical spine 05/16  . Trichomonas   . UTI (urinary tract infection)    current  . Yeast infection    Past Surgical History:  Procedure Laterality Date  . ANTERIOR CERVICAL DECOMP/DISCECTOMY FUSION N/A 05/25/2016   Procedure: Cervical five-six Anterior cervical decompression/diskectomy/fusion;  Surgeon: Ashok Pall, MD;  Location: [facility];  Service: Neurosurgery;  Laterality: N/A;  . APPENDECTOMY    . CARPAL TUNNEL RELEASE    . CARPAL TUNNEL RELEASE Right 08/02/2017   Procedure: CARPAL TUNNEL RELEASE RIGHT;  Surgeon: Ashok Pall, MD;  Location: [facility];  Service: Neurosurgery;  Laterality: Right;  CARPAL TUNNEL RELEASE RIGHT  . COLONOSCOPY WITH PROPOFOL N/A 11/29/2016   Procedure: COLONOSCOPY WITH PROPOFOL;  Surgeon: [name], MD;  [facility];  Service: Endoscopy;  Laterality: N/A;  . ESOPHAGOGASTRODUODENOSCOPY (EGD) WITH PROPOFOL N/A 11/29/2016   Procedure: ESOPHAGOGASTRODUODENOSCOPY (EGD) WITH PROPOFOL;  Surgeon: [name], MD;  [facility];  Service: Endoscopy;  Laterality: N/A;  . JOINT REPLACEMENT    . knee rotation    . LEG TENDON SURGERY    .  OVARIAN CYST REMOVAL    . REPLACEMENT TOTAL KNEE BILATERAL  2006  . rotator cuff surgery     Family History  Problem Relation Age of Onset  . Suicidality Father   . Depression Father   . Alcohol abuse Father   . Emphysema Father        smoked  . Drug abuse Brother   . Alcohol abuse Brother   . Esophageal cancer Brother   . Depression Maternal Aunt   . Alcohol abuse Brother   . Anxiety disorder Daughter   . Depression Daughter   . Suicidality Daughter   . Alcohol abuse Daughter   .  Drug abuse Son   . Alcohol abuse Son   . Emphysema Sister        smoked   Social History   Socioeconomic History  . Marital status: Divorced    Spouse name: Not on file  . Number of children: Not on file  . Years of education: graduate  . Highest education level: Not on file  Social Needs  . Financial resource strain: Not on file  . Food insecurity - worry: Not on file  . Food insecurity - inability: Not on file  . Transportation needs - medical: Not on file  . Transportation needs - non-medical: Not on file  Occupational History  . Occupation: Disabled    Employer: [employer]  Tobacco Use  . Smoking status: Former Smoker    Packs/day: 1.00    Years: 31.00    Pack years: 31.00    Types: Cigarettes    Last attempt to quit: 11/18/1999    Years since quitting: 18.0  . Smokeless tobacco: Never Used  Substance and Sexual Activity  . Alcohol use: No    Alcohol/week: 0.0 oz  . Drug use: No  . Sexual activity: No  Other Topics Concern  . Not on file  Social History Narrative   Right handed, Disabled. Live boyfriend , [name], Caffeine 3-4 cups.  Disabled.   Past Surgical History:  Procedure Laterality Date  . ANTERIOR CERVICAL DECOMP/DISCECTOMY FUSION N/A 05/25/2016   Procedure: Cervical five-six Anterior cervical decompression/diskectomy/fusion;  Surgeon: Ashok Pall, MD;  Location: [facility];  Service: Neurosurgery;  Laterality: N/A;  . APPENDECTOMY    . CARPAL TUNNEL RELEASE    . CARPAL TUNNEL RELEASE Right 08/02/2017   Procedure: CARPAL TUNNEL RELEASE RIGHT;  Surgeon: Ashok Pall, MD;  Location: [facility];  Service: Neurosurgery;  Laterality: Right;  CARPAL TUNNEL RELEASE RIGHT  . COLONOSCOPY WITH PROPOFOL N/A 11/29/2016   Procedure: COLONOSCOPY WITH PROPOFOL;  Surgeon: [name], MD;  [facility];  Service: Endoscopy;  Laterality: N/A;  . ESOPHAGOGASTRODUODENOSCOPY (EGD) WITH PROPOFOL N/A 11/29/2016   Procedure: ESOPHAGOGASTRODUODENOSCOPY (EGD)  WITH PROPOFOL;  Surgeon: [name], MD;  [facility];  Service: Endoscopy;  Laterality: N/A;  . JOINT REPLACEMENT    . knee rotation    . LEG TENDON SURGERY    . OVARIAN CYST REMOVAL    . REPLACEMENT TOTAL KNEE BILATERAL  2006  . rotator cuff surgery

## 2017-11-29 NOTE — Transfer of Care (Signed)
Immediate Anesthesia Transfer of Care Note  Patient: [name]  Procedure(s) Performed: CARPAL TUNNEL RELEASE LEFT (Left )  Patient Location: PACU  Anesthesia Type:General  Level of Consciousness: awake, alert  and oriented  Airway & Oxygen Therapy: Patient Spontanous Breathing and Patient connected to nasal cannula oxygen  Post-op Assessment: Report given to RN and Post -op Vital signs reviewed and stable  Post vital signs: Reviewed and stable  Last Vitals:  Vitals:   11/29/17 0854 11/29/17 1456  BP: 138/83   Pulse: 100   Resp: 20   Temp: 36.6 C 36.5 C  SpO2: 99%     Last Pain:  Vitals:   11/29/17 1456  TempSrc:   PainSc: 0-No pain      Patients Stated Pain Goal: 4 (83/15/17 6160)  Complications: No apparent anesthesia complications

## 2017-11-29 NOTE — Anesthesia Procedure Notes (Signed)
Procedure Name: MAC Date/Time: 11/29/2017 2:02 PM Performed by: Teressa Lower., CRNA Pre-anesthesia Checklist: Patient identified, Emergency Drugs available, Suction available, Patient being monitored and Timeout performed Patient Re-evaluated:Patient Re-evaluated prior to induction Oxygen Delivery Method: Simple face mask Ventilation: Oral airway inserted - appropriate to patient size

## 2017-11-29 NOTE — Anesthesia Preprocedure Evaluation (Addendum)
Anesthesia Evaluation    Reviewed: Allergy & Precautions, Patient's Chart, lab work & pertinent test results  Airway Mallampati: III  TM Distance: >3 FB Neck ROM: Full    Dental  (+) Teeth Intact, Dental Advisory Given, Caps,    Pulmonary COPD,  COPD inhaler, former smoker,    Pulmonary exam normal breath sounds clear to auscultation       Cardiovascular negative cardio ROS Normal cardiovascular exam Rhythm:Regular Rate:Normal  ECG: NSR, rate 81   Neuro/Psych  Headaches, PSYCHIATRIC DISORDERS Anxiety Depression Bipolar Disorder CP  Neuromuscular disease    GI/Hepatic Neg liver ROS, GERD  Medicated and Controlled,IBS   Endo/Other  diabetes, Type 2, Oral Hypoglycemic Agents  Renal/GU negative Renal ROS     Musculoskeletal  (+) Arthritis , Osteoarthritis,  CARPAL TUNNEL SYNDROME BILATERAL   Abdominal (+) + obese,   Peds  Hematology High cholesterol   Anesthesia Other Findings Uses scooter  Reproductive/Obstetrics                            Anesthesia Physical  Anesthesia Plan  ASA: III  Anesthesia Plan: MAC   Post-op Pain Management:    Induction: Intravenous  PONV Risk Score and Plan: 2 and Ondansetron, Treatment may vary due to age or medical condition and Propofol infusion  Airway Management Planned: Natural Airway  Additional Equipment:   Intra-op Plan:   Post-operative Plan:   Informed Consent: I have reviewed the patients History and Physical, chart, labs and discussed the procedure including the risks, benefits and alternatives for the proposed anesthesia with the patient or authorized representative who has indicated his/her understanding and acceptance.   Dental advisory given  Plan Discussed with: CRNA  Anesthesia Plan Comments:        Anesthesia Quick Evaluation

## 2017-11-29 NOTE — Op Note (Signed)
   3:05 PM  PATIENT:  [name]  62 y.o. female with recurrent left carpal tunnel syndrome, admitted for carpal tunnel release.   PRE-OPERATIVE DIAGNOSIS:  Left recurrent Carpal Tunnel Syndrome  POST-OPERATIVE DIAGNOSIS:  Left recurrent Carpal Tunnel Syndrome  PROCEDURE:  Procedure(s): CARPAL TUNNEL RELEASE LEFT, redo  SURGEON: Surgeon(s): Ashok Pall, MD  ANESTHESIA:   local and IV sedation  EBL:  Total I/O In: 300 [I.V.:300] Out: 160 [Urine:150; Blood:10]  COUNT:correct  DICTATION: MACALL MCCROSKEY was taken to the operating room, given IV sedation, and positioned on the operating room table. female had their left upper extremity prepped and draped in a sterile manner. I infiltrated Licodcaine  2/6% 3/785,885 strength epinephrine into the planned incision starting at the proximal palmar crease extending into the hand ~ 1.5cm. I opened the skin with a 15 blade and extended the incision through the skin into the subcutaneous tissue. I used the bipolar cautery to control the subcutaneous bleeding. I dissected sharply through the tissue using forceps also to expose the transverse carpal ligament. I dived the transverse carpal ligament sharply with the 15 blade using the forceps to protect the contents of the carpal tunnel. With the scissors I divided the ligament both proximally and distally to decompress the entire carpal tunnel. I used the scissors to dissect into the forearm to create space to divide the ligament to the proximal palmar crease, and distally into the palm.  I irrigated the wound then closed the incision with vertical interrupted vertical mattress sutures. I placed a sterile dressing, then wrapped the proximal hand and distal forearm with an ace wrap.  PLAN OF CARE: Discharge to home after PACU  PATIENT DISPOSITION:  PACU - hemodynamically stable.   Delay start of Pharmacological VTE agent (>24hrs) due to surgical blood loss or risk of bleeding:  yes

## 2017-11-30 ENCOUNTER — Encounter (HOSPITAL_COMMUNITY): Payer: Self-pay | Admitting: Neurosurgery

## 2017-12-19 DIAGNOSIS — J439 Emphysema, unspecified: Secondary | ICD-10-CM | POA: Diagnosis not present

## 2017-12-19 DIAGNOSIS — R609 Edema, unspecified: Secondary | ICD-10-CM | POA: Diagnosis not present

## 2017-12-19 DIAGNOSIS — E119 Type 2 diabetes mellitus without complications: Secondary | ICD-10-CM | POA: Diagnosis not present

## 2017-12-19 DIAGNOSIS — E782 Mixed hyperlipidemia: Secondary | ICD-10-CM | POA: Diagnosis not present

## 2017-12-19 DIAGNOSIS — J309 Allergic rhinitis, unspecified: Secondary | ICD-10-CM | POA: Diagnosis not present

## 2017-12-19 DIAGNOSIS — K760 Fatty (change of) liver, not elsewhere classified: Secondary | ICD-10-CM | POA: Diagnosis not present

## 2017-12-19 DIAGNOSIS — M810 Age-related osteoporosis without current pathological fracture: Secondary | ICD-10-CM | POA: Diagnosis not present

## 2018-01-07 ENCOUNTER — Ambulatory Visit (INDEPENDENT_AMBULATORY_CARE_PROVIDER_SITE_OTHER): Payer: Medicare Other | Admitting: Psychiatry

## 2018-01-07 ENCOUNTER — Encounter (HOSPITAL_COMMUNITY): Payer: Self-pay | Admitting: Psychiatry

## 2018-01-07 DIAGNOSIS — Z811 Family history of alcohol abuse and dependence: Secondary | ICD-10-CM

## 2018-01-07 DIAGNOSIS — Z813 Family history of other psychoactive substance abuse and dependence: Secondary | ICD-10-CM | POA: Diagnosis not present

## 2018-01-07 DIAGNOSIS — F319 Bipolar disorder, unspecified: Secondary | ICD-10-CM

## 2018-01-07 DIAGNOSIS — Z818 Family history of other mental and behavioral disorders: Secondary | ICD-10-CM | POA: Diagnosis not present

## 2018-01-07 DIAGNOSIS — Z87891 Personal history of nicotine dependence: Secondary | ICD-10-CM | POA: Diagnosis not present

## 2018-01-07 DIAGNOSIS — F419 Anxiety disorder, unspecified: Secondary | ICD-10-CM

## 2018-01-07 DIAGNOSIS — Z736 Limitation of activities due to disability: Secondary | ICD-10-CM

## 2018-01-07 MED ORDER — LAMOTRIGINE 150 MG PO TABS: 150.0000 mg | ORAL_TABLET | Freq: Every day | ORAL | 0 refills | Status: DC

## 2018-01-07 MED ORDER — AMITRIPTYLINE HCL 50 MG PO TABS: 50.0000 mg | ORAL_TABLET | Freq: Every day | ORAL | 0 refills | Status: DC

## 2018-01-07 MED ORDER — BUSPIRONE HCL 10 MG PO TABS: ORAL_TABLET | ORAL | 0 refills | Status: DC
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
## 2018-01-13 DIAGNOSIS — E220 Acromegaly and pituitary gigantism: Secondary | ICD-10-CM | POA: Diagnosis not present

## 2018-01-21 DIAGNOSIS — R3914 Feeling of incomplete bladder emptying: Secondary | ICD-10-CM | POA: Diagnosis not present

## 2018-01-21 DIAGNOSIS — N319 Neuromuscular dysfunction of bladder, unspecified: Secondary | ICD-10-CM | POA: Diagnosis not present

## 2018-01-31 DIAGNOSIS — R339 Retention of urine, unspecified: Secondary | ICD-10-CM | POA: Diagnosis not present

## 2018-02-11 DIAGNOSIS — N3021 Other chronic cystitis with hematuria: Secondary | ICD-10-CM | POA: Diagnosis not present

## 2018-02-11 DIAGNOSIS — R3914 Feeling of incomplete bladder emptying: Secondary | ICD-10-CM | POA: Diagnosis not present

## 2018-02-12 IMAGING — CR DG CERVICAL SPINE COMPLETE 4+V
1 series · 1 of 1 positions shown · non-contrast
Comparison: Cervical spine MRI 04/13/2016.

CLINICAL DATA: ACDF C5-6 due to disc protrusion and spinal
stenosis.

EXAM:
OPERATIVE CERVICAL SPINE - 4+ VIEW

[lat]
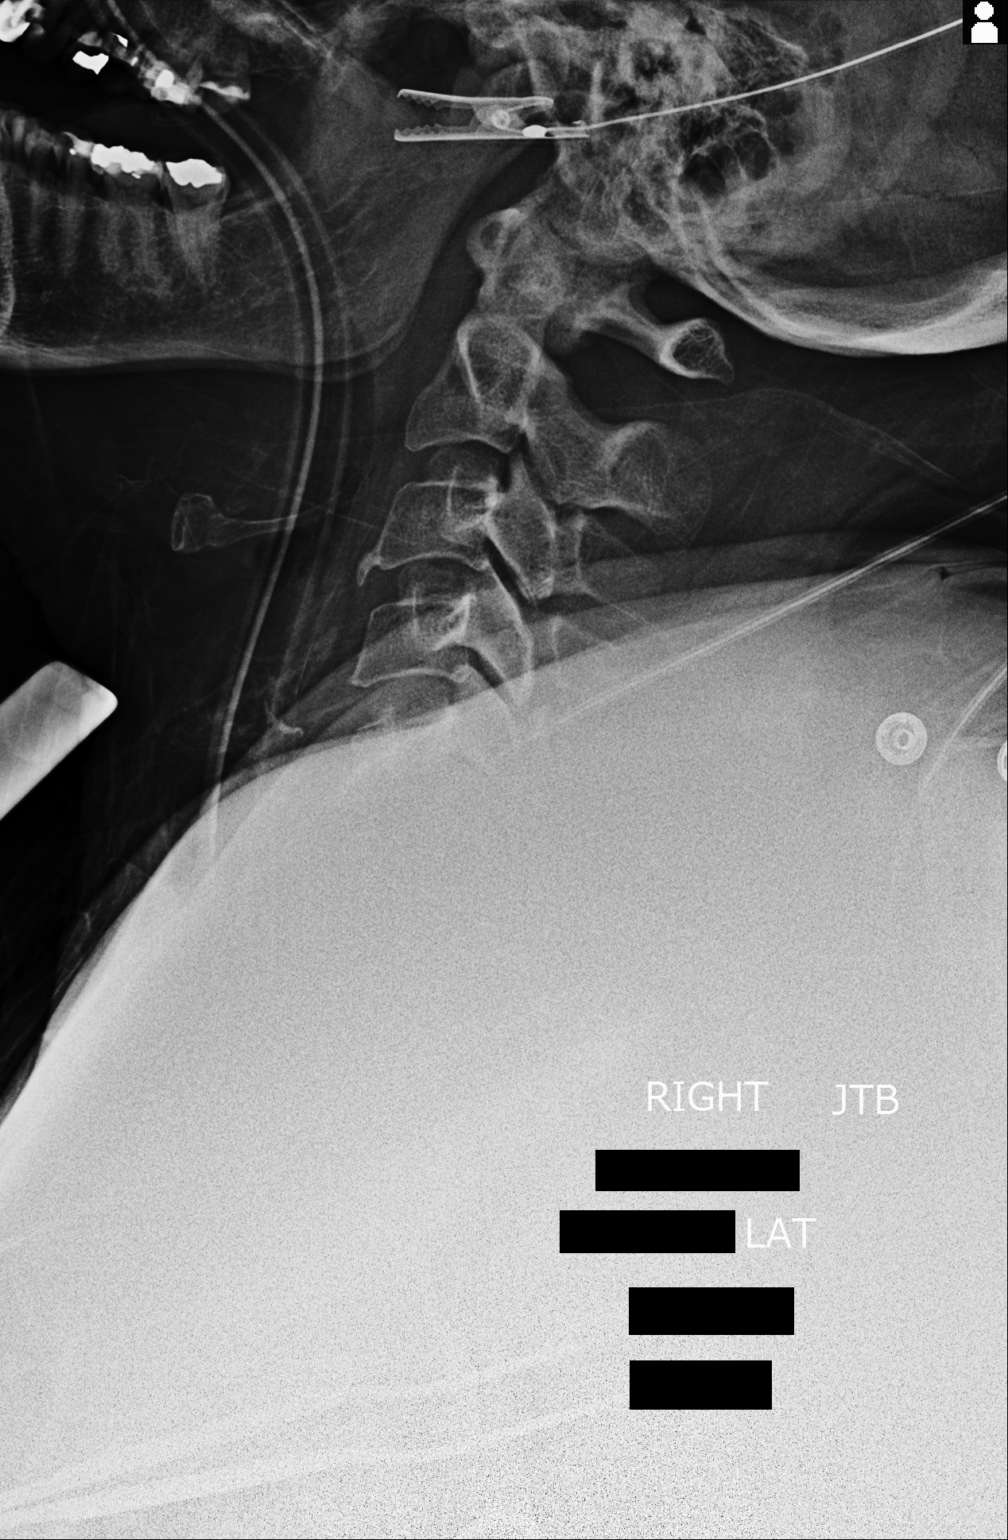

[1 of 1 positions shown; findings below may reference images not displayed]

FINDINGS: Images are submitted for interpretation postoperatively. Initial
image at 3313 hr demonstrates a localizer device on the skin of the
patient's anterior neck at the approximate C4 level. 2nd image at
8814 hr demonstrates a localizer needle with its tip projected over
the C5-6 disc space. 3rd image 5572 hr demonstrates localizer
needles with their tips projected over the C4-5 and C5-6 disc
spaces. 4th image, the completion image, obtained at 8280 hr
demonstrates ACDF at C5-6. C6 is not optimally imaged on this
completion image due to the patient's body habitus.
IMPRESSION: Localizer images confirming the C5-6 level. Completion images
demonstrating ACDF at C5-6, though C6 is suboptimally visualized due
to the patient's body habitus.

## 2018-02-26 DIAGNOSIS — R339 Retention of urine, unspecified: Secondary | ICD-10-CM | POA: Diagnosis not present

## 2018-03-27 ENCOUNTER — Ambulatory Visit (INDEPENDENT_AMBULATORY_CARE_PROVIDER_SITE_OTHER): Payer: Medicare Other | Admitting: Specialist

## 2018-04-02 ENCOUNTER — Ambulatory Visit (INDEPENDENT_AMBULATORY_CARE_PROVIDER_SITE_OTHER): Payer: Medicare Other | Admitting: Orthopaedic Surgery

## 2018-04-02 ENCOUNTER — Encounter (INDEPENDENT_AMBULATORY_CARE_PROVIDER_SITE_OTHER): Payer: Self-pay | Admitting: Orthopaedic Surgery

## 2018-04-02 ENCOUNTER — Ambulatory Visit (INDEPENDENT_AMBULATORY_CARE_PROVIDER_SITE_OTHER): Payer: Medicare Other

## 2018-04-02 DIAGNOSIS — M25522 Pain in left elbow: Secondary | ICD-10-CM

## 2018-04-02 MED ORDER — BUPIVACAINE HCL 0.5 % IJ SOLN
1.0000 mL | INTRAMUSCULAR | Status: AC | PRN
  Administered 2018-04-02: 1 mL via INTRA_ARTICULAR

## 2018-04-02 MED ORDER — METHYLPREDNISOLONE ACETATE 40 MG/ML IJ SUSP
40.0000 mg | INTRAMUSCULAR | Status: AC | PRN
  Administered 2018-04-02: 40 mg via INTRA_ARTICULAR

## 2018-04-02 MED ORDER — LIDOCAINE HCL 1 % IJ SOLN
1.0000 mL | INTRAMUSCULAR | Status: AC | PRN
  Administered 2018-04-02: 1 mL

## 2018-04-02 NOTE — Progress Notes (Signed)
Office Visit Note   Patient: [name]           Date of Birth: 1956-03-01           MRN: [id number] Visit Date: 04/02/2018              Requested by: Helen Hashimoto., MD [address] [place], [place] [id number] PCP: Helen Hashimoto., MD   Assessment & Plan: Visit Diagnoses:  1. Pain in left elbow     Plan: Impression is left elbow medial epicondylitis.  Today, we will inject this with cortisone and provide the patient with flexor stretches.  She will follow-up with [place] as needed for her elbow.  In regards to the power scooter renewal, we filled out the paperwork for that today.  Total face to face encounter time was greater than 25 minutes and over half of this time was spent in counseling and/or coordination of care.  Follow-Up Instructions: Return if symptoms worsen or fail to improve.   Orders:  Orders Placed This Encounter  Procedures  . XR Elbow 2 Views Left   No orders of the defined types were placed in this encounter.     Procedures: Medium Joint Inj: L elbow on 04/02/2018 2:49 PM Indications: pain Details: 25 G needle Medications: 1 mL lidocaine 1 %; 1 mL bupivacaine 0.5 %; 40 mg methylPREDNISolone acetate 40 MG/ML Outcome: tolerated well, no immediate complications Patient was prepped and draped in the usual sterile fashion.       Clinical Data: No additional findings.   Subjective: Chief Complaint  Patient presents with  . Left Elbow - Pain    HPI patient is a 62-year-old female who presents to our clinic today with left elbow pain.  This began a few weeks ago and has not improved.  No known injury or change in activity.  The pain she has is to the medial aspect.  Pain is worse with the extremes of extension of the elbow.  She has not tried any over-the-counter medications.  No numbness, tingling or burning.  She does have a history of C-spine surgical intervention by Dr. Christella Noa.  The other issue she brings up today is the  renewal of her power scooter.  She does have a history of cerebral palsy for which she has severe weakness.  She states she is unable to walk.  Review of Systems as detailed in HPI.  All others reviewed and are negative.   Objective: Vital Signs: There were no vitals taken for this visit.  Physical Exam well-developed well-nourished female no acute distress.  Alert and oriented x3.  Ortho Exam examination of her left elbow reveals no swelling.  Full range of motion.  Increased pain with supination.  Marked tenderness over the medial epicondyle.  She is neurovascularly intact distally.  Specialty Comments:  No specialty comments available.  Imaging: Xr Elbow 2 Views Left  Result Date: 04/02/2018 No structural abnormality    PMFS History: Patient Active Problem List   Diagnosis Date Noted  . Pain in left elbow 04/02/2018  . Chronic right shoulder pain 07/16/2017  . Rotator cuff syndrome, right 05/13/2017  . Cervical radiculopathy 01/14/2017  . Colon cancer screening   . Encounter for screening for gastric cancer    . Osteoarthritis of spine with radiculopathy, cervical region 05/25/2016  . Postmenopausal bleeding 08/04/2015  . Acute respiratory failure ([place]) 07/19/2013  . Nocturnal hypoxemia 07/07/2013  . UTI (lower urinary tract infection) 07/07/2013  .  Hypokalemia 07/07/2013  . Abdominal pain, acute, right lower quadrant 07/07/2013  . Benign paroxysmal positional vertigo 02/24/2013  . Edema of both legs 02/24/2013  . Dysplasia of cervix   . Melanoma in situ ([place])   . Bipolar 1 disorder ([place]) 02/08/2012  . IBS 12/04/2007  . RASH AND OTHER NONSPECIFIC SKIN ERUPTION 12/04/2007  . EMPHYSEMA by CT only 09/05/2007  . NECK MASS 09/05/2007  . 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[place]) 08/13/2007  . Infantile cerebral palsy ([place]) 08/13/2007  . OTITIS EXTERNA, ACUTE 08/13/2007  . IRRITABLE BOWEL SYNDROME, HX OF 08/13/2007  . HYPERLIPIDEMIA 05/10/2007  . DEPRESSION 05/10/2007  . ALLERGIC RHINITIS  05/10/2007   Past Medical History:  Diagnosis Date  . Acromegaly ([place])   . Anxiety   . Arthritis   . Bacterial infection   . Benign paroxysmal positional vertigo 02/24/2013  . Bipolar disorder ([place])   . Bladder infection   . Cancer ([place])    skin cancer  . Cerebral palsy ([place])   . Colitis   . COPD (chronic obstructive pulmonary disease) (HCC)    mild emphysema  . Depression    bi polar  . Diabetes mellitus, type II ([place])   . Dysplasia of cervix   . GERD (gastroesophageal reflux disease)   . Headache(784.0)   . High cholesterol   . History of chicken pox   . History of measles   . History of mumps   . IBS (irritable bowel syndrome)   . Kidney infection   . Melanoma in situ ([place])   . Neck mass   . Neuromuscular disorder ([place])   . Ovarian cyst   . Poor circulation   . Stenosis, cervical spine 05/16  . Trichomonas   . UTI (urinary tract infection)    current  . Yeast infection     Family History  Problem Relation Age of Onset  . Suicidality Father   . Depression Father   . Alcohol abuse Father   . Emphysema Father        smoked  . Drug abuse Brother   . Alcohol abuse Brother   . Esophageal cancer Brother   . Depression Maternal Aunt   . Alcohol abuse Brother   . Anxiety disorder Daughter   . Depression Daughter   . Suicidality Daughter   . Alcohol abuse Daughter   . Drug abuse Son   . Alcohol abuse Son   . Emphysema Sister        smoked    Past Surgical History:  Procedure Laterality Date  . ANTERIOR CERVICAL DECOMP/DISCECTOMY FUSION N/A 05/25/2016   Procedure: Cervical five-six Anterior cervical decompression/diskectomy/fusion;  Surgeon: Ashok Pall, MD;  Location: [facility];  Service: Neurosurgery;  Laterality: N/A;  . APPENDECTOMY    . CARPAL TUNNEL RELEASE    . CARPAL TUNNEL RELEASE Right 08/02/2017   Procedure: CARPAL TUNNEL RELEASE RIGHT;  Surgeon: Ashok Pall, MD;  Location: [facility];  Service: Neurosurgery;  Laterality: Right;  CARPAL TUNNEL  RELEASE RIGHT  . CARPAL TUNNEL RELEASE Left 11/29/2017   Procedure: CARPAL TUNNEL RELEASE LEFT;  Surgeon: Ashok Pall, MD;  Location: [facility];  Service: Neurosurgery;  Laterality: Left;  CARPAL TUNNEL RELEASE LEFT  . COLONOSCOPY WITH PROPOFOL N/A 11/29/2016   Procedure: COLONOSCOPY WITH PROPOFOL;  Surgeon: [name], MD;  [facility];  Service: Endoscopy;  Laterality: N/A;  . ESOPHAGOGASTRODUODENOSCOPY (EGD) WITH PROPOFOL N/A 11/29/2016   Procedure: ESOPHAGOGASTRODUODENOSCOPY (EGD) WITH PROPOFOL;  Surgeon: [name], MD;  [facility];  Service: Endoscopy;  Laterality: N/A;  . JOINT REPLACEMENT    . knee rotation    . LEG TENDON SURGERY    . OVARIAN CYST REMOVAL    . REPLACEMENT TOTAL KNEE BILATERAL  2006  . rotator cuff surgery     Social History   Occupational History  . Occupation: Disabled    Employer: [employer]  Tobacco Use  . Smoking status: Former Smoker    Packs/day: 1.00    Years: 31.00    Pack years: 31.00    Types: Cigarettes    Last attempt to quit: 11/18/1999    Years since quitting: 18.3  . Smokeless tobacco: Never Used  Substance and Sexual Activity  . Alcohol use: No    Alcohol/week: 0.0 oz  . Drug use: No  . Sexual activity: Never

## 2018-04-03 ENCOUNTER — Telehealth (INDEPENDENT_AMBULATORY_CARE_PROVIDER_SITE_OTHER): Payer: Self-pay | Admitting: Orthopaedic Surgery

## 2018-04-03 NOTE — Telephone Encounter (Signed)
Faxed back.

## 2018-04-03 NOTE — Telephone Encounter (Signed)
Sent original to scan 

## 2018-04-03 NOTE — Telephone Encounter (Signed)
Kennyth Lose from [employer] called needing Question #2 corrected on form that was faxed over yesterday. She states Dr. Erlinda Hong needs to cross out the word wheelchair (no whiteout) and initial and date it. Please include office notes and fax over to [phone]. CB # [phone]

## 2018-04-07 ENCOUNTER — Other Ambulatory Visit (HOSPITAL_COMMUNITY): Payer: Self-pay | Admitting: Psychiatry

## 2018-04-07 DIAGNOSIS — R339 Retention of urine, unspecified: Secondary | ICD-10-CM | POA: Diagnosis not present

## 2018-04-07 DIAGNOSIS — F419 Anxiety disorder, unspecified: Secondary | ICD-10-CM

## 2018-04-07 DIAGNOSIS — F319 Bipolar disorder, unspecified: Secondary | ICD-10-CM

## 2018-04-09 ENCOUNTER — Encounter (HOSPITAL_COMMUNITY): Payer: Self-pay | Admitting: Psychiatry

## 2018-04-09 ENCOUNTER — Ambulatory Visit (INDEPENDENT_AMBULATORY_CARE_PROVIDER_SITE_OTHER): Payer: Medicare Other | Admitting: Psychiatry

## 2018-04-09 VITALS — BP 120/72 | HR 92 | Ht 67.0 in | Wt 220.0 lb

## 2018-04-09 DIAGNOSIS — Z91419 Personal history of unspecified adult abuse: Secondary | ICD-10-CM | POA: Diagnosis not present

## 2018-04-09 DIAGNOSIS — Z818 Family history of other mental and behavioral disorders: Secondary | ICD-10-CM

## 2018-04-09 DIAGNOSIS — Z87891 Personal history of nicotine dependence: Secondary | ICD-10-CM | POA: Diagnosis not present

## 2018-04-09 DIAGNOSIS — F419 Anxiety disorder, unspecified: Secondary | ICD-10-CM | POA: Diagnosis not present

## 2018-04-09 DIAGNOSIS — F319 Bipolar disorder, unspecified: Secondary | ICD-10-CM

## 2018-04-09 DIAGNOSIS — Z813 Family history of other psychoactive substance abuse and dependence: Secondary | ICD-10-CM | POA: Diagnosis not present

## 2018-04-09 DIAGNOSIS — Z811 Family history of alcohol abuse and dependence: Secondary | ICD-10-CM

## 2018-04-09 MED ORDER — LAMOTRIGINE 150 MG PO TABS: 150.0000 mg | ORAL_TABLET | Freq: Every day | ORAL | 0 refills | Status: DC

## 2018-04-09 MED ORDER — BUSPIRONE HCL 10 MG PO TABS: ORAL_TABLET | ORAL | 0 refills | Status: DC

## 2018-04-09 MED ORDER — AMITRIPTYLINE HCL 50 MG PO TABS: 50.0000 mg | ORAL_TABLET | Freq: Every day | ORAL | 0 refills | Status: DC
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
## 2018-05-14 DIAGNOSIS — R339 Retention of urine, unspecified: Secondary | ICD-10-CM | POA: Diagnosis not present

## 2018-05-15 DIAGNOSIS — E782 Mixed hyperlipidemia: Secondary | ICD-10-CM | POA: Diagnosis not present

## 2018-05-15 DIAGNOSIS — Z0000 Encounter for general adult medical examination without abnormal findings: Secondary | ICD-10-CM | POA: Diagnosis not present

## 2018-05-15 DIAGNOSIS — E785 Hyperlipidemia, unspecified: Secondary | ICD-10-CM | POA: Diagnosis not present

## 2018-05-15 DIAGNOSIS — E119 Type 2 diabetes mellitus without complications: Secondary | ICD-10-CM | POA: Diagnosis not present

## 2018-05-15 DIAGNOSIS — Z1211 Encounter for screening for malignant neoplasm of colon: Secondary | ICD-10-CM | POA: Diagnosis not present

## 2018-05-15 DIAGNOSIS — Z136 Encounter for screening for cardiovascular disorders: Secondary | ICD-10-CM | POA: Diagnosis not present

## 2018-05-15 DIAGNOSIS — Z79899 Other long term (current) drug therapy: Secondary | ICD-10-CM | POA: Diagnosis not present

## 2018-05-16 ENCOUNTER — Other Ambulatory Visit: Payer: Self-pay | Admitting: Physician Assistant

## 2018-05-16 DIAGNOSIS — E2839 Other primary ovarian failure: Secondary | ICD-10-CM

## 2018-05-16 DIAGNOSIS — Z1239 Encounter for other screening for malignant neoplasm of breast: Secondary | ICD-10-CM

## 2018-05-27 ENCOUNTER — Telehealth (INDEPENDENT_AMBULATORY_CARE_PROVIDER_SITE_OTHER): Payer: Self-pay | Admitting: Orthopaedic Surgery

## 2018-05-27 NOTE — Telephone Encounter (Signed)
[employer]  Kennyth Lose  [phone]  Option 4    Checking on the status of product sheet for medical equipment

## 2018-05-27 NOTE — Telephone Encounter (Signed)
Called her no answer LMOM to return my call.

## 2018-05-28 ENCOUNTER — Ambulatory Visit (INDEPENDENT_AMBULATORY_CARE_PROVIDER_SITE_OTHER): Payer: Self-pay

## 2018-05-28 ENCOUNTER — Ambulatory Visit (INDEPENDENT_AMBULATORY_CARE_PROVIDER_SITE_OTHER): Payer: Medicare Other | Admitting: Orthopaedic Surgery

## 2018-05-28 ENCOUNTER — Encounter (INDEPENDENT_AMBULATORY_CARE_PROVIDER_SITE_OTHER): Payer: Self-pay | Admitting: Orthopaedic Surgery

## 2018-05-28 DIAGNOSIS — M25522 Pain in left elbow: Secondary | ICD-10-CM | POA: Diagnosis not present

## 2018-05-28 DIAGNOSIS — M79645 Pain in left finger(s): Secondary | ICD-10-CM | POA: Insufficient documentation

## 2018-05-28 DIAGNOSIS — M79641 Pain in right hand: Secondary | ICD-10-CM | POA: Insufficient documentation

## 2018-05-28 MED ORDER — TRAMADOL HCL 50 MG PO TABS: ORAL_TABLET | ORAL | 0 refills | Status: DC

## 2018-05-28 MED ORDER — METHYLPREDNISOLONE ACETATE 40 MG/ML IJ SUSP
13.3300 mg | INTRAMUSCULAR | Status: AC | PRN
  Administered 2018-05-28: 13.33 mg

## 2018-05-28 MED ORDER — BUPIVACAINE HCL 0.25 % IJ SOLN
0.3300 mL | INTRAMUSCULAR | Status: AC | PRN
  Administered 2018-05-28: .33 mL

## 2018-05-28 MED ORDER — LIDOCAINE HCL 1 % IJ SOLN
1.0000 mL | INTRAMUSCULAR | Status: AC | PRN
  Administered 2018-05-28: 1 mL
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
## 2018-05-29 NOTE — Telephone Encounter (Signed)
Received a fax I am assuming that is what she needs, this has been faxed and original copy sent to scan.

## 2018-06-06 ENCOUNTER — Telehealth (INDEPENDENT_AMBULATORY_CARE_PROVIDER_SITE_OTHER): Payer: Self-pay | Admitting: Orthopaedic Surgery

## 2018-06-06 NOTE — Telephone Encounter (Signed)
Angelica from [facility] called stating that they need additional clinical information in regards to the knee scooter.  WS#[phone].  Thank you.

## 2018-06-08 ENCOUNTER — Other Ambulatory Visit (HOSPITAL_COMMUNITY): Payer: Self-pay | Admitting: Psychiatry

## 2018-06-08 DIAGNOSIS — F319 Bipolar disorder, unspecified: Secondary | ICD-10-CM

## 2018-06-08 DIAGNOSIS — F419 Anxiety disorder, unspecified: Secondary | ICD-10-CM

## 2018-06-09 NOTE — Telephone Encounter (Signed)
I tried calling her back. No answer. LMVM for her advising that we were returning her call to discuss what additional information she was needing.

## 2018-06-12 DIAGNOSIS — H5713 Ocular pain, bilateral: Secondary | ICD-10-CM | POA: Diagnosis not present

## 2018-06-26 DIAGNOSIS — R339 Retention of urine, unspecified: Secondary | ICD-10-CM | POA: Diagnosis not present

## 2018-07-08 ENCOUNTER — Other Ambulatory Visit (HOSPITAL_COMMUNITY): Payer: Self-pay | Admitting: Psychiatry

## 2018-07-08 DIAGNOSIS — F419 Anxiety disorder, unspecified: Secondary | ICD-10-CM

## 2018-07-08 DIAGNOSIS — F319 Bipolar disorder, unspecified: Secondary | ICD-10-CM

## 2018-07-10 ENCOUNTER — Ambulatory Visit (HOSPITAL_COMMUNITY): Payer: Self-pay | Admitting: Psychiatry

## 2018-07-18 ENCOUNTER — Other Ambulatory Visit (HOSPITAL_COMMUNITY): Payer: Self-pay

## 2018-07-18 DIAGNOSIS — F319 Bipolar disorder, unspecified: Secondary | ICD-10-CM

## 2018-07-18 DIAGNOSIS — F419 Anxiety disorder, unspecified: Secondary | ICD-10-CM

## 2018-07-18 MED ORDER — BUSPIRONE HCL 10 MG PO TABS: ORAL_TABLET | ORAL | 0 refills | Status: DC

## 2018-07-30 DIAGNOSIS — R51 Headache: Secondary | ICD-10-CM | POA: Diagnosis not present

## 2018-07-30 DIAGNOSIS — G629 Polyneuropathy, unspecified: Secondary | ICD-10-CM | POA: Diagnosis not present

## 2018-07-30 DIAGNOSIS — Z23 Encounter for immunization: Secondary | ICD-10-CM | POA: Diagnosis not present

## 2018-07-30 DIAGNOSIS — R5383 Other fatigue: Secondary | ICD-10-CM | POA: Diagnosis not present

## 2018-08-14 DIAGNOSIS — R339 Retention of urine, unspecified: Secondary | ICD-10-CM | POA: Diagnosis not present

## 2018-08-19 ENCOUNTER — Ambulatory Visit (INDEPENDENT_AMBULATORY_CARE_PROVIDER_SITE_OTHER): Payer: Medicare Other | Admitting: Orthopaedic Surgery

## 2018-08-19 ENCOUNTER — Encounter (INDEPENDENT_AMBULATORY_CARE_PROVIDER_SITE_OTHER): Payer: Self-pay | Admitting: Orthopaedic Surgery

## 2018-08-19 DIAGNOSIS — R29898 Other symptoms and signs involving the musculoskeletal system: Secondary | ICD-10-CM

## 2018-08-19 DIAGNOSIS — G809 Cerebral palsy, unspecified: Secondary | ICD-10-CM | POA: Diagnosis not present

## 2018-08-19 DIAGNOSIS — M545 Low back pain, unspecified: Secondary | ICD-10-CM | POA: Insufficient documentation

## 2018-08-19 MED ORDER — TRAMADOL HCL 50 MG PO TABS: ORAL_TABLET | ORAL | 0 refills | Status: DC

## 2018-08-19 NOTE — Progress Notes (Signed)
Patient: [name]           Date of Birth: 10-18-1956           MRN: [id number] Visit Date: 08/19/2018 PCP: Helen Hashimoto., MD   Assessment & Plan: Patient is a 62 year old female who comes in today for a face-to-face encounter for her mobile scooter/chair.  History of cerebral palsy, lumbar and cervical spine arthritis as well as bilateral lower extremity weakness.  She has been using a mobile chair for the past decade or so.  She uses chair for all ADLs as she is unable to walk more than 5 feet with the use of a walker/cane.  New documentation was provided to the patient today for this.  She will follow-up with [place] as needed.    Chief Complaint:  Chief Complaint  Patient presents with  . FACE TO FACE FOR W/C EVAL   Visit Diagnoses:  1. Low back pain, unspecified back pain laterality, unspecified chronicity, unspecified whether sciatica present   2. Cerebral palsy, unspecified type ([place])   3. Weakness of both lower extremities      Follow-Up Instructions: Return if symptoms worsen or fail to improve.   Orders:  No orders of the defined types were placed in this encounter.  Meds ordered this encounter  Medications  . traMADol (ULTRAM) 50 MG tablet    Sig: TAKE 1-2 TABS PO Q8 HOURS PRN PAIN    Dispense:  30 tablet    Refill:  0    Imaging: No results found.  PMFS History: Patient Active Problem List   Diagnosis Date Noted  . Low back pain 08/19/2018  . Weakness of both lower extremities 08/19/2018  . Pain in finger of left hand 05/28/2018  . Pain in right hand 05/28/2018  . Pain in left elbow 04/02/2018  . Chronic right shoulder pain 07/16/2017  . Rotator cuff syndrome, right 05/13/2017  . Cervical radiculopathy 01/14/2017  . Colon cancer screening   . Encounter for screening for gastric cancer    . Osteoarthritis of spine with radiculopathy, cervical region 05/25/2016  . Postmenopausal bleeding 08/04/2015  . Acute respiratory failure ([place])  07/19/2013  . Nocturnal hypoxemia 07/07/2013  . UTI (lower urinary tract infection) 07/07/2013  . Hypokalemia 07/07/2013  . Abdominal pain, acute, right lower quadrant 07/07/2013  . Benign paroxysmal positional vertigo 02/24/2013  . Edema of both legs 02/24/2013  . Dysplasia of cervix   . Melanoma in situ ([place])   . Bipolar 1 disorder ([place]) 02/08/2012  . IBS 12/04/2007  . RASH AND OTHER NONSPECIFIC SKIN ERUPTION 12/04/2007  . EMPHYSEMA by CT only 09/05/2007  . NECK MASS 09/05/2007  . 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[place]) 08/13/2007  . Cerebral palsy ([place]) 08/13/2007  . OTITIS EXTERNA, ACUTE 08/13/2007  . IRRITABLE BOWEL SYNDROME, HX OF 08/13/2007  . HYPERLIPIDEMIA 05/10/2007  . DEPRESSION 05/10/2007  . ALLERGIC RHINITIS 05/10/2007   Past Medical History:  Diagnosis Date  . Acromegaly ([place])   . Anxiety   . Arthritis   . Bacterial infection   . Benign paroxysmal positional vertigo 02/24/2013  . Bipolar disorder ([place])   . Bladder infection   . Cancer ([place])    skin cancer  . Cerebral palsy ([place])   . Colitis   . COPD (chronic obstructive pulmonary disease) (HCC)    mild emphysema  . Depression    bi polar  . Diabetes mellitus, type II ([place])   . Dysplasia of cervix   . GERD (gastroesophageal  reflux disease)   . Headache(784.0)   . High cholesterol   . History of chicken pox   . History of measles   . History of mumps   . IBS (irritable bowel syndrome)   . Kidney infection   . Melanoma in situ ([place])   . Neck mass   . Neuromuscular disorder ([place])   . Ovarian cyst   . Poor circulation   . Stenosis, cervical spine 05/16  . Trichomonas   . UTI (urinary tract infection)    current  . Yeast infection     Family History  Problem Relation Age of Onset  . Suicidality Father   . Depression Father   . Alcohol abuse Father   . Emphysema Father        smoked  . Drug abuse Brother   . Alcohol abuse Brother   . Esophageal cancer Brother   . Depression Maternal Aunt   . Alcohol abuse  Brother   . Anxiety disorder Daughter   . Depression Daughter   . Suicidality Daughter   . Alcohol abuse Daughter   . Drug abuse Son   . Alcohol abuse Son   . Emphysema Sister        smoked    Past Surgical History:  Procedure Laterality Date  . ANTERIOR CERVICAL DECOMP/DISCECTOMY FUSION N/A 05/25/2016   Procedure: Cervical five-six Anterior cervical decompression/diskectomy/fusion;  Surgeon: Ashok Pall, MD;  Location: [facility];  Service: Neurosurgery;  Laterality: N/A;  . APPENDECTOMY    . CARPAL TUNNEL RELEASE    . CARPAL TUNNEL RELEASE Right 08/02/2017   Procedure: CARPAL TUNNEL RELEASE RIGHT;  Surgeon: Ashok Pall, MD;  Location: [facility];  Service: Neurosurgery;  Laterality: Right;  CARPAL TUNNEL RELEASE RIGHT  . CARPAL TUNNEL RELEASE Left 11/29/2017   Procedure: CARPAL TUNNEL RELEASE LEFT;  Surgeon: Ashok Pall, MD;  Location: [facility];  Service: Neurosurgery;  Laterality: Left;  CARPAL TUNNEL RELEASE LEFT  . COLONOSCOPY WITH PROPOFOL N/A 11/29/2016   Procedure: COLONOSCOPY WITH PROPOFOL;  Surgeon: [name], MD;  Location: [facility];  Service: Endoscopy;  Laterality: N/A;  . ESOPHAGOGASTRODUODENOSCOPY (EGD) WITH PROPOFOL N/A 11/29/2016   Procedure: ESOPHAGOGASTRODUODENOSCOPY (EGD) WITH PROPOFOL;  Surgeon: [name], MD;  Location: [facility];  Service: Endoscopy;  Laterality: N/A;  . JOINT REPLACEMENT    . knee rotation    . LEG TENDON SURGERY    . OVARIAN CYST REMOVAL    . REPLACEMENT TOTAL KNEE BILATERAL  2006  . rotator cuff surgery     Social History   Occupational History  . Occupation: Disabled    Employer: [employer]  Tobacco Use  . Smoking status: Former Smoker    Packs/day: 1.00    Years: 31.00    Pack years: 31.00    Types: Cigarettes    Last attempt to quit: 11/18/1999    Years since quitting: 18.7  . Smokeless tobacco: Never Used  Substance and Sexual Activity  . Alcohol use: No    Alcohol/week: 0.0 standard drinks  . Drug use: No  .  Sexual activity: Never

## 2018-08-21 DIAGNOSIS — M4802 Spinal stenosis, cervical region: Secondary | ICD-10-CM | POA: Diagnosis not present

## 2018-08-21 DIAGNOSIS — M542 Cervicalgia: Secondary | ICD-10-CM | POA: Diagnosis not present

## 2018-08-27 ENCOUNTER — Telehealth (INDEPENDENT_AMBULATORY_CARE_PROVIDER_SITE_OTHER): Payer: Self-pay | Admitting: Orthopaedic Surgery

## 2018-08-27 NOTE — Telephone Encounter (Signed)
Pending. Dr Erlinda Hong is out of the office and he needs to fill out paperwork.

## 2018-08-27 NOTE — Telephone Encounter (Signed)
Patient is requesting that the forms be faxed to [employer] regarding her power chair/scooter. Their fax # [phone]

## 2018-08-28 ENCOUNTER — Telehealth (INDEPENDENT_AMBULATORY_CARE_PROVIDER_SITE_OTHER): Payer: Self-pay | Admitting: Orthopaedic Surgery

## 2018-08-29 ENCOUNTER — Ambulatory Visit (HOSPITAL_COMMUNITY): Payer: Self-pay | Admitting: Psychiatry

## 2018-09-01 NOTE — Telephone Encounter (Signed)
Holding for Dr Erlinda Hong until returns to clinic on Tuesday.

## 2018-09-04 DIAGNOSIS — N319 Neuromuscular dysfunction of bladder, unspecified: Secondary | ICD-10-CM | POA: Diagnosis not present

## 2018-09-06 ENCOUNTER — Other Ambulatory Visit (HOSPITAL_COMMUNITY): Payer: Self-pay | Admitting: Psychiatry

## 2018-09-06 DIAGNOSIS — F419 Anxiety disorder, unspecified: Secondary | ICD-10-CM

## 2018-09-06 DIAGNOSIS — F319 Bipolar disorder, unspecified: Secondary | ICD-10-CM

## 2018-09-08 DIAGNOSIS — M4722 Other spondylosis with radiculopathy, cervical region: Secondary | ICD-10-CM | POA: Diagnosis not present

## 2018-09-08 DIAGNOSIS — M5412 Radiculopathy, cervical region: Secondary | ICD-10-CM | POA: Diagnosis not present

## 2018-09-08 NOTE — Telephone Encounter (Signed)
Form placed on Dr Phoebe Sharps office desk. Pending.

## 2018-09-10 ENCOUNTER — Telehealth (INDEPENDENT_AMBULATORY_CARE_PROVIDER_SITE_OTHER): Payer: Self-pay | Admitting: Orthopaedic Surgery

## 2018-09-10 NOTE — Telephone Encounter (Signed)
FAXED TO [facility] DR [facility] TO APPROVE AND FILL OUT

## 2018-09-10 NOTE — Telephone Encounter (Signed)
[employer]  [phone] option [place]  Two forms faxed over for the doctor to complete and fax was sent again today.

## 2018-09-15 ENCOUNTER — Other Ambulatory Visit: Payer: Self-pay | Admitting: Neurosurgery

## 2018-09-15 ENCOUNTER — Other Ambulatory Visit (HOSPITAL_COMMUNITY): Payer: Self-pay | Admitting: Psychiatry

## 2018-09-15 DIAGNOSIS — M4722 Other spondylosis with radiculopathy, cervical region: Secondary | ICD-10-CM

## 2018-09-15 DIAGNOSIS — F419 Anxiety disorder, unspecified: Secondary | ICD-10-CM

## 2018-09-15 DIAGNOSIS — F319 Bipolar disorder, unspecified: Secondary | ICD-10-CM

## 2018-09-17 DIAGNOSIS — K760 Fatty (change of) liver, not elsewhere classified: Secondary | ICD-10-CM | POA: Diagnosis not present

## 2018-09-17 DIAGNOSIS — E119 Type 2 diabetes mellitus without complications: Secondary | ICD-10-CM | POA: Diagnosis not present

## 2018-09-17 DIAGNOSIS — J439 Emphysema, unspecified: Secondary | ICD-10-CM | POA: Diagnosis not present

## 2018-09-17 DIAGNOSIS — E782 Mixed hyperlipidemia: Secondary | ICD-10-CM | POA: Diagnosis not present

## 2018-09-17 DIAGNOSIS — R609 Edema, unspecified: Secondary | ICD-10-CM | POA: Diagnosis not present

## 2018-09-18 ENCOUNTER — Other Ambulatory Visit (HOSPITAL_COMMUNITY): Payer: Self-pay

## 2018-09-18 DIAGNOSIS — F319 Bipolar disorder, unspecified: Secondary | ICD-10-CM

## 2018-09-18 DIAGNOSIS — F419 Anxiety disorder, unspecified: Secondary | ICD-10-CM

## 2018-09-18 MED ORDER — AMITRIPTYLINE HCL 50 MG PO TABS: 50.0000 mg | ORAL_TABLET | Freq: Every day | ORAL | 0 refills | Status: DC

## 2018-09-24 ENCOUNTER — Ambulatory Visit
Admission: RE | Admit: 2018-09-24 | Discharge: 2018-09-24 | Disposition: A | Discharge status: Home / Self Care | Payer: Medicare Other | Source: Ambulatory Visit | Attending: Neurosurgery | Admitting: Neurosurgery

## 2018-09-24 DIAGNOSIS — M4722 Other spondylosis with radiculopathy, cervical region: Secondary | ICD-10-CM

## 2018-09-24 DIAGNOSIS — M4802 Spinal stenosis, cervical region: Secondary | ICD-10-CM | POA: Diagnosis not present

## 2018-10-01 ENCOUNTER — Ambulatory Visit (INDEPENDENT_AMBULATORY_CARE_PROVIDER_SITE_OTHER): Payer: Medicare Other | Admitting: Psychiatry

## 2018-10-01 ENCOUNTER — Encounter

## 2018-10-01 DIAGNOSIS — F419 Anxiety disorder, unspecified: Secondary | ICD-10-CM | POA: Diagnosis not present

## 2018-10-01 DIAGNOSIS — F319 Bipolar disorder, unspecified: Secondary | ICD-10-CM | POA: Diagnosis not present

## 2018-10-01 MED ORDER — LAMOTRIGINE 150 MG PO TABS: ORAL_TABLET | ORAL | 0 refills | Status: DC

## 2018-10-01 MED ORDER — BUSPIRONE HCL 10 MG PO TABS: ORAL_TABLET | ORAL | 0 refills | Status: DC

## 2018-10-01 MED ORDER — AMITRIPTYLINE HCL 50 MG PO TABS: 50.0000 mg | ORAL_TABLET | Freq: Every day | ORAL | 0 refills | Status: DC
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
## 2018-10-06 ENCOUNTER — Other Ambulatory Visit: Payer: Self-pay | Admitting: Physician Assistant

## 2018-10-06 DIAGNOSIS — M810 Age-related osteoporosis without current pathological fracture: Secondary | ICD-10-CM

## 2018-10-10 DIAGNOSIS — R339 Retention of urine, unspecified: Secondary | ICD-10-CM | POA: Diagnosis not present

## 2018-10-17 DIAGNOSIS — G809 Cerebral palsy, unspecified: Secondary | ICD-10-CM | POA: Diagnosis not present

## 2018-10-17 DIAGNOSIS — M545 Low back pain: Secondary | ICD-10-CM | POA: Diagnosis not present

## 2018-10-17 DIAGNOSIS — M6281 Muscle weakness (generalized): Secondary | ICD-10-CM | POA: Diagnosis not present

## 2018-10-17 DIAGNOSIS — R262 Difficulty in walking, not elsewhere classified: Secondary | ICD-10-CM | POA: Diagnosis not present

## 2018-10-20 DIAGNOSIS — M4722 Other spondylosis with radiculopathy, cervical region: Secondary | ICD-10-CM | POA: Diagnosis not present

## 2018-11-03 DIAGNOSIS — M4722 Other spondylosis with radiculopathy, cervical region: Secondary | ICD-10-CM | POA: Diagnosis not present

## 2018-11-03 DIAGNOSIS — R030 Elevated blood-pressure reading, without diagnosis of hypertension: Secondary | ICD-10-CM | POA: Diagnosis not present

## 2018-11-17 DIAGNOSIS — Z96653 Presence of artificial knee joint, bilateral: Secondary | ICD-10-CM | POA: Diagnosis not present

## 2018-11-17 DIAGNOSIS — K589 Irritable bowel syndrome without diarrhea: Secondary | ICD-10-CM | POA: Diagnosis not present

## 2018-11-17 DIAGNOSIS — Z1231 Encounter for screening mammogram for malignant neoplasm of breast: Secondary | ICD-10-CM | POA: Diagnosis not present

## 2018-11-17 DIAGNOSIS — G809 Cerebral palsy, unspecified: Secondary | ICD-10-CM | POA: Diagnosis not present

## 2018-11-17 DIAGNOSIS — M8589 Other specified disorders of bone density and structure, multiple sites: Secondary | ICD-10-CM | POA: Diagnosis not present

## 2018-11-18 DIAGNOSIS — R339 Retention of urine, unspecified: Secondary | ICD-10-CM | POA: Diagnosis not present

## 2018-11-21 ENCOUNTER — Ambulatory Visit (INDEPENDENT_AMBULATORY_CARE_PROVIDER_SITE_OTHER): Payer: Medicare Other | Admitting: Orthopaedic Surgery

## 2018-11-21 ENCOUNTER — Ambulatory Visit (INDEPENDENT_AMBULATORY_CARE_PROVIDER_SITE_OTHER): Payer: Medicare Other

## 2018-11-21 ENCOUNTER — Encounter (INDEPENDENT_AMBULATORY_CARE_PROVIDER_SITE_OTHER): Payer: Self-pay | Admitting: Physician Assistant

## 2018-11-21 DIAGNOSIS — M542 Cervicalgia: Secondary | ICD-10-CM | POA: Insufficient documentation

## 2018-11-21 MED ORDER — METHYLPREDNISOLONE 4 MG PO TBPK: ORAL_TABLET | ORAL | 0 refills | Status: DC

## 2018-11-21 NOTE — Progress Notes (Signed)
Office Visit Note   Patient: [name]           Date of Birth: 1956/10/27           MRN: [id number] Visit Date: 11/21/2018              Requested by: Helen Hashimoto., MD [address] [place], [place] [id number] PCP: Helen Hashimoto., MD   Assessment & Plan: Visit Diagnoses:  1. Neck pain     Plan: Impression is cervical spine radiculopathy.  At this point, we will call in a steroid taper and send the patient to physical therapy.  I will also refer her to Dr. Ernestina Patches for an epidural steroid injection.  Should the patient symptoms return she has been instructed to follow-up with Dr. Christella Noa.  Follow-Up Instructions: Return if symptoms worsen or fail to improve.   Orders:  Orders Placed This Encounter  Procedures  . XR Cervical Spine 2 or 3 views  . Ambulatory referral to Physical Medicine Rehab   Meds ordered this encounter  Medications  . methylPREDNISolone (MEDROL DOSEPAK) 4 MG TBPK tablet    Sig: Take as directed    Dispense:  21 tablet    Refill:  0      Procedures: No procedures performed   Clinical Data: No additional findings.   Subjective: Chief Complaint  Patient presents with  . Neck - Pain    HPI patient is a pleasant 63 year old female who presents our clinic today left-sided neck and periscapular pain.  This is been ongoing for a while.  The pain she has is constant worse with cervical spine flexion and lateral rotation.  She notes occasional numbness to the left thumb.  No significant weakness.  She has had a previous C5-6 ACDF by Dr. Christella Noa.  Recent MRI from 09/24/2018 ordered by Dr. Christella Noa showed moderate left C4, C5 and C7 foraminal stenosis.  She was seen by Dr. Christella Noa following the MRI and was told that this was not significant enough for surgical intervention at the time.  No recent epidural steroid injections or physical therapy.  Review of Systems as detailed in HPI.  All others reviewed and are  negative.   Objective: Vital Signs: There were no vitals taken for this visit.  Physical Exam well-developed and well-nourished female in no acute distress.  Alert and oriented x3.  Ortho Exam examination of her cervical spine reveals no spinous tenderness.  She has mild tenderness over the left parascapular region.  Increased pain with cervical flexion and lateral rotation.  No focal weakness.  Full sensation distally.  Specialty Comments:  No specialty comments available.  Imaging: Xr Cervical Spine 2 Or 3 Views  Result Date: 11/21/2018 Imaging shows previous fusion C5-6 with significant degenerative disc disease at C3-4, 4 5 and C6-7    PMFS History: Patient Active Problem List   Diagnosis Date Noted  . Neck pain 11/21/2018  . Low back pain 08/19/2018  . Weakness of both lower extremities 08/19/2018  . Pain in finger of left hand 05/28/2018  . Pain in right hand 05/28/2018  . Pain in left elbow 04/02/2018  . Chronic right shoulder pain 07/16/2017  . Rotator cuff syndrome, right 05/13/2017  . Cervical radiculopathy 01/14/2017  . Colon cancer screening   . Encounter for screening for gastric cancer    . Osteoarthritis of spine with radiculopathy, cervical region 05/25/2016  . Postmenopausal bleeding 08/04/2015  . Acute respiratory failure ([place]) 07/19/2013  .  Nocturnal hypoxemia 07/07/2013  . UTI (lower urinary tract infection) 07/07/2013  . Hypokalemia 07/07/2013  . Abdominal pain, acute, right lower quadrant 07/07/2013  . Benign paroxysmal positional vertigo 02/24/2013  . Edema of both legs 02/24/2013  . Dysplasia of cervix   . Melanoma in situ ([place])   . Bipolar 1 disorder ([place]) 02/08/2012  . IBS 12/04/2007  . RASH AND OTHER NONSPECIFIC SKIN ERUPTION 12/04/2007  . EMPHYSEMA by CT only 09/05/2007  . NECK MASS 09/05/2007  . 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[place]) 08/13/2007  . Cerebral palsy ([place]) 08/13/2007  . OTITIS EXTERNA, ACUTE 08/13/2007  . IRRITABLE BOWEL SYNDROME, HX OF  08/13/2007  . HYPERLIPIDEMIA 05/10/2007  . DEPRESSION 05/10/2007  . ALLERGIC RHINITIS 05/10/2007   Past Medical History:  Diagnosis Date  . Acromegaly ([place])   . Anxiety   . Arthritis   . Bacterial infection   . Benign paroxysmal positional vertigo 02/24/2013  . Bipolar disorder ([place])   . Bladder infection   . Cancer ([place])    skin cancer  . Cerebral palsy ([place])   . Colitis   . COPD (chronic obstructive pulmonary disease) (HCC)    mild emphysema  . Depression    bi polar  . Diabetes mellitus, type II ([place])   . Dysplasia of cervix   . GERD (gastroesophageal reflux disease)   . Headache(784.0)   . High cholesterol   . History of chicken pox   . History of measles   . History of mumps   . IBS (irritable bowel syndrome)   . Kidney infection   . Melanoma in situ ([place])   . Neck mass   . Neuromuscular disorder ([place])   . Ovarian cyst   . Poor circulation   . Stenosis, cervical spine 05/16  . Trichomonas   . UTI (urinary tract infection)    current  . Yeast infection     Family History  Problem Relation Age of Onset  . Suicidality Father   . Depression Father   . Alcohol abuse Father   . Emphysema Father        smoked  . Drug abuse Brother   . Alcohol abuse Brother   . Esophageal cancer Brother   . Depression Maternal Aunt   . Alcohol abuse Brother   . Anxiety disorder Daughter   . Depression Daughter   . Suicidality Daughter   . Alcohol abuse Daughter   . Drug abuse Son   . Alcohol abuse Son   . Emphysema Sister        smoked    Past Surgical History:  Procedure Laterality Date  . ANTERIOR CERVICAL DECOMP/DISCECTOMY FUSION N/A 05/25/2016   Procedure: Cervical five-six Anterior cervical decompression/diskectomy/fusion;  Surgeon: Ashok Pall, MD;  Location: [facility];  Service: Neurosurgery;  Laterality: N/A;  . APPENDECTOMY    . CARPAL TUNNEL RELEASE    . CARPAL TUNNEL RELEASE Right 08/02/2017   Procedure: CARPAL TUNNEL RELEASE RIGHT;  Surgeon: Ashok Pall, MD;  Location: [facility];  Service: Neurosurgery;  Laterality: Right;  CARPAL TUNNEL RELEASE RIGHT  . CARPAL TUNNEL RELEASE Left 11/29/2017   Procedure: CARPAL TUNNEL RELEASE LEFT;  Surgeon: Ashok Pall, MD;  Location: [facility];  Service: Neurosurgery;  Laterality: Left;  CARPAL TUNNEL RELEASE LEFT  . COLONOSCOPY WITH PROPOFOL N/A 11/29/2016   Procedure: COLONOSCOPY WITH PROPOFOL;  Surgeon: [name], MD;  [facility];  Service: Endoscopy;  Laterality: N/A;  . ESOPHAGOGASTRODUODENOSCOPY (EGD) WITH PROPOFOL N/A 11/29/2016   Procedure: ESOPHAGOGASTRODUODENOSCOPY (EGD)  WITH PROPOFOL;  Surgeon: [name], MD;  [facility];  Service: Endoscopy;  Laterality: N/A;  . JOINT REPLACEMENT    . knee rotation    . LEG TENDON SURGERY    . OVARIAN CYST REMOVAL    . REPLACEMENT TOTAL KNEE BILATERAL  2006  . rotator cuff surgery     Social History   Occupational History  . Occupation: Disabled    Employer: [employer]  Tobacco Use  . Smoking status: Former Smoker    Packs/day: 1.00    Years: 31.00    Pack years: 31.00    Types: Cigarettes    Last attempt to quit: 11/18/1999    Years since quitting: 19.0  . Smokeless tobacco: Never Used  Substance and Sexual Activity  . Alcohol use: No    Alcohol/week: 0.0 standard drinks  . Drug use: No  . Sexual activity: Never

## 2018-12-09 DIAGNOSIS — R339 Retention of urine, unspecified: Secondary | ICD-10-CM | POA: Diagnosis not present

## 2018-12-09 DIAGNOSIS — L820 Inflamed seborrheic keratosis: Secondary | ICD-10-CM | POA: Diagnosis not present

## 2018-12-09 DIAGNOSIS — L720 Epidermal cyst: Secondary | ICD-10-CM | POA: Diagnosis not present

## 2018-12-09 DIAGNOSIS — L821 Other seborrheic keratosis: Secondary | ICD-10-CM | POA: Diagnosis not present

## 2018-12-09 DIAGNOSIS — D485 Neoplasm of uncertain behavior of skin: Secondary | ICD-10-CM | POA: Diagnosis not present

## 2018-12-09 DIAGNOSIS — D1801 Hemangioma of skin and subcutaneous tissue: Secondary | ICD-10-CM | POA: Diagnosis not present

## 2018-12-10 ENCOUNTER — Telehealth (HOSPITAL_COMMUNITY): Payer: Self-pay

## 2018-12-10 ENCOUNTER — Other Ambulatory Visit (HOSPITAL_COMMUNITY): Payer: Self-pay | Admitting: Psychiatry

## 2018-12-10 DIAGNOSIS — F319 Bipolar disorder, unspecified: Secondary | ICD-10-CM

## 2018-12-10 DIAGNOSIS — F419 Anxiety disorder, unspecified: Secondary | ICD-10-CM

## 2018-12-10 MED ORDER — AMITRIPTYLINE HCL 50 MG PO TABS: 50.0000 mg | ORAL_TABLET | Freq: Every day | ORAL | 0 refills | Status: DC

## 2018-12-10 NOTE — Telephone Encounter (Signed)
Received fax refill request for Amitriptyline 50mg 

## 2018-12-19 NOTE — Telephone Encounter (Signed)
Refill was sent in on 12/10/2018

## 2018-12-22 ENCOUNTER — Other Ambulatory Visit (HOSPITAL_COMMUNITY): Payer: Self-pay

## 2018-12-22 DIAGNOSIS — F319 Bipolar disorder, unspecified: Secondary | ICD-10-CM

## 2018-12-22 DIAGNOSIS — F419 Anxiety disorder, unspecified: Secondary | ICD-10-CM

## 2018-12-22 MED ORDER — BUSPIRONE HCL 10 MG PO TABS: ORAL_TABLET | ORAL | 0 refills | Status: DC

## 2018-12-22 NOTE — Progress Notes (Signed)
Patient will be out of tablets on 12-31-2018. Follow up appointment is 01-09-2019

## 2018-12-29 DIAGNOSIS — R339 Retention of urine, unspecified: Secondary | ICD-10-CM | POA: Diagnosis not present

## 2019-01-09 ENCOUNTER — Encounter (HOSPITAL_COMMUNITY): Payer: Self-pay | Admitting: Psychiatry

## 2019-01-09 ENCOUNTER — Other Ambulatory Visit: Payer: Self-pay

## 2019-01-09 ENCOUNTER — Ambulatory Visit (INDEPENDENT_AMBULATORY_CARE_PROVIDER_SITE_OTHER): Payer: Medicare Other | Admitting: Psychiatry

## 2019-01-09 DIAGNOSIS — Z811 Family history of alcohol abuse and dependence: Secondary | ICD-10-CM

## 2019-01-09 DIAGNOSIS — Z818 Family history of other mental and behavioral disorders: Secondary | ICD-10-CM

## 2019-01-09 DIAGNOSIS — F319 Bipolar disorder, unspecified: Secondary | ICD-10-CM

## 2019-01-09 DIAGNOSIS — F419 Anxiety disorder, unspecified: Secondary | ICD-10-CM | POA: Diagnosis not present

## 2019-01-09 MED ORDER — AMITRIPTYLINE HCL 50 MG PO TABS: 50.0000 mg | ORAL_TABLET | Freq: Every day | ORAL | 0 refills | Status: DC

## 2019-01-09 MED ORDER — BUSPIRONE HCL 10 MG PO TABS: ORAL_TABLET | ORAL | 0 refills | Status: DC

## 2019-01-09 MED ORDER — LAMOTRIGINE 150 MG PO TABS: ORAL_TABLET | ORAL | 0 refills | Status: DC
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
## 2019-01-29 DIAGNOSIS — R339 Retention of urine, unspecified: Secondary | ICD-10-CM | POA: Diagnosis not present

## 2019-01-30 DIAGNOSIS — J439 Emphysema, unspecified: Secondary | ICD-10-CM | POA: Diagnosis not present

## 2019-01-30 DIAGNOSIS — E782 Mixed hyperlipidemia: Secondary | ICD-10-CM | POA: Diagnosis not present

## 2019-01-30 DIAGNOSIS — E119 Type 2 diabetes mellitus without complications: Secondary | ICD-10-CM | POA: Diagnosis not present

## 2019-01-30 DIAGNOSIS — R609 Edema, unspecified: Secondary | ICD-10-CM | POA: Diagnosis not present

## 2019-01-30 DIAGNOSIS — K760 Fatty (change of) liver, not elsewhere classified: Secondary | ICD-10-CM | POA: Diagnosis not present

## 2019-02-18 DIAGNOSIS — G629 Polyneuropathy, unspecified: Secondary | ICD-10-CM | POA: Diagnosis not present

## 2019-02-18 DIAGNOSIS — K219 Gastro-esophageal reflux disease without esophagitis: Secondary | ICD-10-CM | POA: Diagnosis not present

## 2019-02-18 DIAGNOSIS — E119 Type 2 diabetes mellitus without complications: Secondary | ICD-10-CM | POA: Diagnosis not present

## 2019-02-18 DIAGNOSIS — R51 Headache: Secondary | ICD-10-CM | POA: Diagnosis not present

## 2019-03-03 ENCOUNTER — Encounter: Payer: Self-pay | Admitting: Neurology

## 2019-03-17 DIAGNOSIS — R339 Retention of urine, unspecified: Secondary | ICD-10-CM | POA: Diagnosis not present

## 2019-04-08 DIAGNOSIS — E782 Mixed hyperlipidemia: Secondary | ICD-10-CM | POA: Diagnosis not present

## 2019-04-08 DIAGNOSIS — K760 Fatty (change of) liver, not elsewhere classified: Secondary | ICD-10-CM | POA: Diagnosis not present

## 2019-04-08 DIAGNOSIS — E119 Type 2 diabetes mellitus without complications: Secondary | ICD-10-CM | POA: Diagnosis not present

## 2019-04-08 DIAGNOSIS — J439 Emphysema, unspecified: Secondary | ICD-10-CM | POA: Diagnosis not present

## 2019-04-17 ENCOUNTER — Encounter (HOSPITAL_COMMUNITY): Payer: Self-pay | Admitting: Psychiatry

## 2019-04-17 ENCOUNTER — Other Ambulatory Visit: Payer: Self-pay

## 2019-04-17 ENCOUNTER — Ambulatory Visit (INDEPENDENT_AMBULATORY_CARE_PROVIDER_SITE_OTHER): Payer: Medicare Other | Admitting: Psychiatry

## 2019-04-17 DIAGNOSIS — F419 Anxiety disorder, unspecified: Secondary | ICD-10-CM | POA: Diagnosis not present

## 2019-04-17 DIAGNOSIS — F319 Bipolar disorder, unspecified: Secondary | ICD-10-CM | POA: Diagnosis not present

## 2019-04-17 MED ORDER — LAMOTRIGINE 150 MG PO TABS: ORAL_TABLET | ORAL | 0 refills | Status: DC

## 2019-04-17 MED ORDER — BUSPIRONE HCL 10 MG PO TABS: ORAL_TABLET | ORAL | 0 refills | Status: DC

## 2019-04-17 MED ORDER — AMITRIPTYLINE HCL 50 MG PO TABS: 50.0000 mg | ORAL_TABLET | Freq: Every day | ORAL | 0 refills | Status: DC
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
## 2019-04-28 DIAGNOSIS — R339 Retention of urine, unspecified: Secondary | ICD-10-CM | POA: Diagnosis not present

## 2019-05-04 ENCOUNTER — Ambulatory Visit: Payer: Medicare Other | Admitting: Neurology

## 2019-05-21 DIAGNOSIS — Z9181 History of falling: Secondary | ICD-10-CM | POA: Diagnosis not present

## 2019-05-21 DIAGNOSIS — E785 Hyperlipidemia, unspecified: Secondary | ICD-10-CM | POA: Diagnosis not present

## 2019-05-21 DIAGNOSIS — Z0000 Encounter for general adult medical examination without abnormal findings: Secondary | ICD-10-CM | POA: Diagnosis not present

## 2019-06-01 NOTE — Progress Notes (Deleted)
NEUROLOGY CONSULTATION NOTE  [name] MRN: [id number] DOB: 10-Jan-1956  Referring provider: Jenean Lindau, MD Primary care provider: Cyndi Bender, PA-C  Reason for consult:  headaches  HISTORY OF PRESENT ILLNESS: [name] is a 63 year old female with cerebral palsy, acromegaly, Bipolar 1 disorder, and COPD who presents for headache.  History supplemented by referring provider note.  Onset:  *** Location:  *** Quality:  *** Intensity:  ***.  *** denies new headache, thunderclap headache or severe headache that wakes *** from sleep. Aura:  *** Premonitory Phase:  *** Postdrome:  *** Associated symptoms:  ***.  *** denies associated unilateral numbness or weakness. Duration:  *** Frequency:  *** Frequency of abortive medication: *** Triggers:  *** Exacerbating factors:  *** Relieving factors:  *** Activity:  ***  MRI/MRA of brain from 03/10/13 personally reviewed and demonstrated tiny nonspecific pervintricular and subcortical white matter changes.  Repeat MRI of brain from 09/08/14 personally reviewed and stable.  For cervical and occipital pain, she had an MRI of the cervical spine on 02/11/15, which showed multilevel left-sided foraminal stenosis, including at C6-7, as well as bilaterally at C5-6, and with moderate central canal stenosis at C5-6.  For cervical radiculitis she is treated by her orthopedist/pain specialist.    Current NSAIDS:  *** Current analgesics:  tramadol Current triptans:  *** Current ergotamine:  *** Current anti-emetic:  *** Current muscle relaxants:  *** Current anti-anxiolytic:  BuSpar Current sleep aide:  melatonin Current Antihypertensive medications:  Spironolactone, Maxzide Current Antidepressant medications:  Amitriptyline 75m Current Anticonvulsant medications:  Lamotrigine 1537mdaily, gabapentin 3009mwice daily Current anti-CGRP:  *** Current Vitamins/Herbal/Supplements:  Melatonin, MVI Current  Antihistamines/Decongestants:  Flonase Other therapy:  *** Hormone/birth control:  *** Other medications:  ***  Past NSAIDS:  naproxen Past analgesics:  Tylenol, Excedrin Migraine Past abortive triptans:  *** Past abortive ergotamine:  *** Past muscle relaxants:  Flexeril, tizanidine Past anti-emetic:  Promethazine, Zofran ODT 8mg14mst antihypertensive medications:  *** Past antidepressant medications:  *** Past anticonvulsant medications:  *** Past anti-CGRP:  *** Past vitamins/Herbal/Supplements:  *** Past antihistamines/decongestants:  Benadryl Other past therapies:  ***  Caffeine:  *** Alcohol:  *** Smoker:  *** Diet:  *** Exercise:  *** Depression:  ***; Anxiety:  *** Other pain:  *** Sleep hygiene:  *** Family history of headache:  ***  PAST MEDICAL HISTORY: Past Medical History:  Diagnosis Date  . Acromegaly (HCC)[place]. Anxiety   . Arthritis   . Bacterial infection   . Benign paroxysmal positional vertigo 02/24/2013  . Bipolar disorder (HCC)[place]. Bladder infection   . Cancer (HCC)[place] skin cancer  . Cerebral palsy (HCC)[place]. Colitis   . COPD (chronic obstructive pulmonary disease) (HCC)    mild emphysema  . Depression    bi polar  . Diabetes mellitus, type II (HCC)[place]. Dysplasia of cervix   . GERD (gastroesophageal reflux disease)   . Headache(784.0)   . High cholesterol   . History of chicken pox   . History of measles   . History of mumps   . IBS (irritable bowel syndrome)   . Kidney infection   . Melanoma in situ (HCC)[place]. Neck mass   . Neuromuscular disorder (HCC)[place]. Ovarian cyst   . Poor circulation   . Stenosis, cervical spine 05/16  . Trichomonas   . UTI (urinary tract infection)    current  .  Yeast infection     PAST SURGICAL HISTORY: Past Surgical History:  Procedure Laterality Date  . ANTERIOR CERVICAL DECOMP/DISCECTOMY FUSION N/A 05/25/2016   Procedure: Cervical five-six Anterior cervical decompression/diskectomy/fusion;  Surgeon:  Ashok Pall, MD;  Location: [facility];  Service: Neurosurgery;  Laterality: N/A;  . APPENDECTOMY    . CARPAL TUNNEL RELEASE    . CARPAL TUNNEL RELEASE Right 08/02/2017   Procedure: CARPAL TUNNEL RELEASE RIGHT;  Surgeon: Ashok Pall, MD;  Location: [facility];  Service: Neurosurgery;  Laterality: Right;  CARPAL TUNNEL RELEASE RIGHT  . CARPAL TUNNEL RELEASE Left 11/29/2017   Procedure: CARPAL TUNNEL RELEASE LEFT;  Surgeon: Ashok Pall, MD;  Location: [facility];  Service: Neurosurgery;  Laterality: Left;  CARPAL TUNNEL RELEASE LEFT  . COLONOSCOPY WITH PROPOFOL N/A 11/29/2016   Procedure: COLONOSCOPY WITH PROPOFOL;  Surgeon: [name], MD;  Location: [facility];  Service: Endoscopy;  Laterality: N/A;  . ESOPHAGOGASTRODUODENOSCOPY (EGD) WITH PROPOFOL N/A 11/29/2016   Procedure: ESOPHAGOGASTRODUODENOSCOPY (EGD) WITH PROPOFOL;  Surgeon: [name], MD;  Location: [facility];  Service: Endoscopy;  Laterality: N/A;  . JOINT REPLACEMENT    . knee rotation    . LEG TENDON SURGERY    . OVARIAN CYST REMOVAL    . REPLACEMENT TOTAL KNEE BILATERAL  2006  . rotator cuff surgery      MEDICATIONS: Current Outpatient Medications on File Prior to Visit  Medication Sig Dispense Refill  . albuterol (PROVENTIL HFA;VENTOLIN HFA) 108 (90 Base) MCG/ACT inhaler Inhale 1-2 puffs into the lungs every 6 (six) hours as needed for wheezing or shortness of breath.    . Alcohol Swabs (ALCOHOL PREP) 70 % PADS     . alendronate (FOSAMAX) 70 MG tablet Take 70 mg by mouth every Monday. Take with a full glass of water on an empty stomach.    [name] amitriptyline (ELAVIL) 50 MG tablet Take 1 tablet (50 mg total) by mouth at bedtime. 90 tablet 0  . AQUALANCE LANCETS 30G MISC     . Ascorbic Acid (VITAMIN C) 500 MG CAPS Take 500 mg by mouth at bedtime.     . Astragalus Extract POWD Take 1,000 mg by mouth daily.    [name] atorvastatin (LIPITOR) 40 MG tablet Take 40 mg by mouth daily.    [name] b complex vitamins tablet Take 1 tablet by  mouth daily.    . Blood Glucose Calibration (OT ULTRA/FASTTK CNTRL SOLN) SOLN     . Blood Glucose Monitoring Suppl (ONE TOUCH ULTRA 2) w/Device KIT     . busPIRone (BUSPAR) 10 MG tablet TAKE 1 TABLET(10 MG) BY MOUTH TWICE DAILY 180 tablet 0  . Calcium Carb-Cholecalciferol (CALCIUM 600+D3 PO) Take 1 tablet by mouth daily.    . cephALEXin (KEFLEX) 250 MG capsule Take 250 mg by mouth at bedtime.   6  . fluticasone (FLONASE) 50 MCG/ACT nasal spray Place 1 spray into both nostrils 2 (two) times daily.   1  . gabapentin (NEURONTIN) 300 MG capsule Take 300 mg by mouth 2 (two) times daily.    [name] lamoTRIgine (LAMICTAL) 150 MG tablet TAKE 1 TABLET(150 MG) BY MOUTH DAILY 90 tablet 0  . Lancet Devices (ADJUSTABLE LANCING DEVICE) MISC     . Melatonin 10 MG TABS Take 10 mg by mouth at bedtime.    . metFORMIN (GLUCOPHAGE-XR) 500 MG 24 hr tablet Take 500 mg by mouth 3 (three) times a week. IN THE MORNING.    . methenamine (HIPREX) 1 g tablet Take  1 g by mouth at bedtime.     . Multiple Vitamin (MULTIVITAMIN WITH MINERALS) TABS tablet Take 1 tablet by mouth daily. Women's Multivitamin.    [name] MYRBETRIQ 25 MG TB24 tablet Take 25 mg by mouth at bedtime.     . nitrofurantoin, macrocrystal-monohydrate, (MACROBID) 100 MG capsule Take 100 mg by mouth at bedtime.    . ONE TOUCH ULTRA TEST test strip     . Polyethyl Glycol-Propyl Glycol (LUBRICANT EYE DROPS) 0.4-0.3 % SOLN Place 1-2 drops into both eyes 3 (three) times daily as needed (for dry eyes.).    [name] ranitidine (ZANTAC) 300 MG tablet Take 300 mg by mouth daily.    [name] spironolactone (ALDACTONE) 100 MG tablet Take 100 mg by mouth daily.   5  . tamsulosin (FLOMAX) 0.4 MG CAPS capsule Take 0.4 mg by mouth at bedtime.  11  . traMADol (ULTRAM) 50 MG tablet Take 1 tablet (50 mg total) by mouth every 6 (six) hours as needed for moderate pain. 30 tablet 0  . triamterene-hydrochlorothiazide (MAXZIDE) 75-50 MG tablet Take 1 tablet by mouth daily.  3   No current  facility-administered medications on file prior to visit.     ALLERGIES: Allergies  Allergen Reactions  . Miconazole Nitrate Hives and Itching  . Sulfonamide Derivatives Hives and Itching    FAMILY HISTORY: Family History  Problem Relation Age of Onset  . Suicidality Father   . Depression Father   . Alcohol abuse Father   . Emphysema Father        smoked  . Drug abuse Brother   . Alcohol abuse Brother   . Esophageal cancer Brother   . Depression Maternal Aunt   . Alcohol abuse Brother   . Anxiety disorder Daughter   . Depression Daughter   . Suicidality Daughter   . Alcohol abuse Daughter   . Drug abuse Son   . Alcohol abuse Son   . Emphysema Sister        smoked    SOCIAL HISTORY: Social History   Socioeconomic History  . Marital status: Divorced    Spouse name: Not on file  . Number of children: Not on file  . Years of education: graduate  . Highest education level: Not on file  Occupational History  . Occupation: Disabled    Employer: [employer]  Social Needs  . Financial resource strain: Not on file  . Food insecurity    Worry: Not on file    Inability: Not on file  . Transportation needs    Medical: Not on file    Non-medical: Not on file  Tobacco Use  . Smoking status: Former Smoker    Packs/day: 1.00    Years: 31.00    Pack years: 31.00    Types: Cigarettes    Quit date: 11/18/1999    Years since quitting: 19.5  . Smokeless tobacco: Never Used  Substance and Sexual Activity  . Alcohol use: No    Alcohol/week: 0.0 standard drinks  . Drug use: No  . Sexual activity: Never  Lifestyle  . Physical activity    Days per week: Not on file    Minutes per session: Not on file  . Stress: Not on file  Relationships  . Social Herbalist on phone: Not on file    Gets together: Not on file    Attends religious service: Not on file    Active member of club or organization: Not on file  Attends meetings of clubs or organizations: Not on  file    Relationship status: Not on file  . Intimate partner violence    Fear of current or ex partner: Not on file    Emotionally abused: Not on file    Physically abused: Not on file    Forced sexual activity: Not on file  Other Topics Concern  . Not on file  Social History Narrative   Right handed, Disabled. Live boyfriend , [name], Caffeine 3-4 cups.  Disabled.    REVIEW OF SYSTEMS: Constitutional: No fevers, chills, or sweats, no generalized fatigue, change in appetite Eyes: No visual changes, double vision, eye pain Ear, nose and throat: No hearing loss, ear pain, nasal congestion, sore throat Cardiovascular: No chest pain, palpitations Respiratory:  No shortness of breath at rest or with exertion, wheezes GastrointestinaI: No nausea, vomiting, diarrhea, abdominal pain, fecal incontinence Genitourinary:  No dysuria, urinary retention or frequency Musculoskeletal:  No neck pain, back pain Integumentary: No rash, pruritus, skin lesions Neurological: as above Psychiatric: No depression, insomnia, anxiety Endocrine: No palpitations, fatigue, diaphoresis, mood swings, change in appetite, change in weight, increased thirst Hematologic/Lymphatic:  No purpura, petechiae. Allergic/Immunologic: no itchy/runny eyes, nasal congestion, recent allergic reactions, rashes  PHYSICAL EXAM: *** General: No acute distress.  Patient appears ***-groomed.  *** Head:  Normocephalic/atraumatic Eyes:  fundi examined but not visualized Neck: supple, no paraspinal tenderness, full range of motion Back: No paraspinal tenderness Heart: regular rate and rhythm Lungs: Clear to auscultation bilaterally. Vascular: No carotid bruits. Neurological Exam: Mental status: alert and oriented to person, place, and time, recent and remote memory intact, fund of knowledge intact, attention and concentration intact, speech fluent and not dysarthric, language intact. Cranial nerves: CN I: not tested CN  II: pupils equal, round and reactive to light, visual fields intact CN III, IV, VI:  full range of motion, no nystagmus, no ptosis CN V: facial sensation intact CN VII: upper and lower face symmetric CN VIII: hearing intact CN IX, X: gag intact, uvula midline CN XI: sternocleidomastoid and trapezius muscles intact CN XII: tongue midline Bulk & Tone: increased tone in lower extremities Motor:  4+/5 both hip flexors, otherwise, 5/5 throughout  Sensation:  Pinprick *** temperature *** and vibration sensation intact.  ***. Deep Tendon Reflexes:  2+ upper extremities, 3+ lower extremities, bilateral Babinski Finger to nose testing:  Without dysmetria.  *** Heel to shin:  Unable to assess Gait:  Nonambulatory.  Sits in motorized scooter.  IMPRESSION: ***  PLAN: ***  Thank you for allowing me to take part in the care of this patient.  Metta Clines, DO  CC: ***

## 2019-06-02 ENCOUNTER — Ambulatory Visit: Payer: Medicare Other | Admitting: Neurology

## 2019-06-04 DIAGNOSIS — R339 Retention of urine, unspecified: Secondary | ICD-10-CM | POA: Diagnosis not present

## 2019-06-29 DIAGNOSIS — R339 Retention of urine, unspecified: Secondary | ICD-10-CM | POA: Diagnosis not present

## 2019-07-01 NOTE — Progress Notes (Signed)
NEUROLOGY CONSULTATION NOTE  [name] MRN: [id number] DOB: 06/04/1956  Referring provider: Cyndi Bender, PA-C Primary care provider: Jenean Lindau, MD  Reason for consult:  headaches  HISTORY OF PRESENT ILLNESS: [name] is a 63 year old female with cerebral palsy, acromegaly, cervical spinal stenosis s/p ACDF, s/p bilateral carpal tunnel surgery, Bipolar 1 disorder, and COPD who presents for headache.  History supplemented by referring provider note.  Onset:  Several years Location:  Retro-orbital, right worse than left  Quality:  Throbbing/burning. Intensity:  8/10.  She denies new headache, thunderclap headache Sometimes has burning on crown.  She does have chronic neck pain s/p surgery. Aura:  none Premonitory Phase:  none Postdrome:  none Associated symptoms:  None.  She denies associated nausea, vomiting, photophobia, phonophobia, visual disturbance or unilateral numbness or weakness. Duration:  Off and on throughout the day Frequency:  1 or 2 days a week. Triggers:  none Exacerbating factors:  none Relieving factors:  no Activity:  Able to function.  MRI/MRA of brain from 03/10/13 personally reviewed and demonstrated tiny nonspecific pervintricular and subcortical white matter changes.  Repeat MRI of brain from 09/08/14 personally reviewed and stable.  For cervical and occipital pain, she had an MRI of the cervical spine on 02/11/15, which showed multilevel left-sided foraminal stenosis, including at C6-7, as well as bilaterally at C5-6, and with moderate central canal stenosis at C5-6.  For cervical radiculitis she is treated by her orthopedist/pain specialist.    Current NSAIDS:  none Current analgesics:  Tramadol (rarely uses) Current triptans:  none Current ergotamine:  none Current anti-emetic:  none Current muscle relaxants:  tizanidine Current anti-anxiolytic:  BuSpar Current sleep aide:  melatonin Current Antihypertensive medications:   Spironolactone, Maxzide Current Antidepressant medications:  Amitriptyline 94m Current Anticonvulsant medications:  Lamotrigine 1540mdaily, gabapentin 30045mwice daily Current anti-CGRP:  none Current Vitamins/Herbal/Supplements:  Melatonin, MVI Current Antihistamines/Decongestants:  Flonase Other therapy:  none  Past NSAIDS:  naproxen Past analgesics:  Tylenol, Excedrin Migraine Past abortive triptans:  none Past abortive ergotamine:  none Past muscle relaxants:  Flexeril Past anti-emetic:  Promethazine, Zofran ODT 8mg41mst antihypertensive medications:  none Past antidepressant medications:  Zoloft Past anticonvulsant medications:  none Past anti-CGRP:  none Past vitamins/Herbal/Supplements:  none Past antihistamines/decongestants:  Benadryl Other past therapies:  none  Caffeine:  Rarely drinks coffee.  Drinks a Coke about twice a daily.  Drinks tea. Diet:  Soda.  No water. Depression:  mild; Anxiety:  yes Other pain:  Neck pain Sleep hygiene:  good Family history of headache:  sister  PAST MEDICAL HISTORY: Past Medical History:  Diagnosis Date  . Acromegaly (HCC)[place]. Anxiety   . Arthritis   . Bacterial infection   . Benign paroxysmal positional vertigo 02/24/2013  . Bipolar disorder (HCC)[place]. Bladder infection   . Cancer (HCC)[place] skin cancer  . Cerebral palsy (HCC)[place]. Colitis   . COPD (chronic obstructive pulmonary disease) (HCC)    mild emphysema  . Depression    bi polar  . Diabetes mellitus, type II (HCC)[place]. Dysplasia of cervix   . GERD (gastroesophageal reflux disease)   . Headache(784.0)   . High cholesterol   . History of chicken pox   . History of measles   . History of mumps   . IBS (irritable bowel syndrome)   . Kidney infection   . Melanoma in situ (HCC)[place]. Neck mass   .  Neuromuscular disorder ([place])   . Ovarian cyst   . Poor circulation   . Stenosis, cervical spine 05/16  . Trichomonas   . UTI (urinary tract infection)    current  .  Yeast infection     PAST SURGICAL HISTORY: Past Surgical History:  Procedure Laterality Date  . ANTERIOR CERVICAL DECOMP/DISCECTOMY FUSION N/A 05/25/2016   Procedure: Cervical five-six Anterior cervical decompression/diskectomy/fusion;  Surgeon: Ashok Pall, MD;  Location: [facility];  Service: Neurosurgery;  Laterality: N/A;  . APPENDECTOMY    . CARPAL TUNNEL RELEASE    . CARPAL TUNNEL RELEASE Right 08/02/2017   Procedure: CARPAL TUNNEL RELEASE RIGHT;  Surgeon: Ashok Pall, MD;  Location: [facility];  Service: Neurosurgery;  Laterality: Right;  CARPAL TUNNEL RELEASE RIGHT  . CARPAL TUNNEL RELEASE Left 11/29/2017   Procedure: CARPAL TUNNEL RELEASE LEFT;  Surgeon: Ashok Pall, MD;  Location: [facility];  Service: Neurosurgery;  Laterality: Left;  CARPAL TUNNEL RELEASE LEFT  . COLONOSCOPY WITH PROPOFOL N/A 11/29/2016   Procedure: COLONOSCOPY WITH PROPOFOL;  Surgeon: [name], MD;  Location: [facility];  Service: Endoscopy;  Laterality: N/A;  . ESOPHAGOGASTRODUODENOSCOPY (EGD) WITH PROPOFOL N/A 11/29/2016   Procedure: ESOPHAGOGASTRODUODENOSCOPY (EGD) WITH PROPOFOL;  Surgeon: [name], MD;  Location: [facility];  Service: Endoscopy;  Laterality: N/A;  . JOINT REPLACEMENT    . knee rotation    . LEG TENDON SURGERY    . OVARIAN CYST REMOVAL    . REPLACEMENT TOTAL KNEE BILATERAL  2006  . rotator cuff surgery      MEDICATIONS: Current Outpatient Medications on File Prior to Visit  Medication Sig Dispense Refill  . albuterol (PROVENTIL HFA;VENTOLIN HFA) 108 (90 Base) MCG/ACT inhaler Inhale 1-2 puffs into the lungs every 6 (six) hours as needed for wheezing or shortness of breath.    . Alcohol Swabs (ALCOHOL PREP) 70 % PADS     . alendronate (FOSAMAX) 70 MG tablet Take 70 mg by mouth every Monday. Take with a full glass of water on an empty stomach.    [name] amitriptyline (ELAVIL) 50 MG tablet Take 1 tablet (50 mg total) by mouth at bedtime. 90 tablet 0  . AQUALANCE LANCETS 30G MISC     .  Ascorbic Acid (VITAMIN C) 500 MG CAPS Take 500 mg by mouth at bedtime.     . Astragalus Extract POWD Take 1,000 mg by mouth daily.    [name] atorvastatin (LIPITOR) 40 MG tablet Take 40 mg by mouth daily.    [name] b complex vitamins tablet Take 1 tablet by mouth daily.    . Blood Glucose Calibration (OT ULTRA/FASTTK CNTRL SOLN) SOLN     . Blood Glucose Monitoring Suppl (ONE TOUCH ULTRA 2) w/Device KIT     . busPIRone (BUSPAR) 10 MG tablet TAKE 1 TABLET(10 MG) BY MOUTH TWICE DAILY 180 tablet 0  . Calcium Carb-Cholecalciferol (CALCIUM 600+D3 PO) Take 1 tablet by mouth daily.    . cephALEXin (KEFLEX) 250 MG capsule Take 250 mg by mouth at bedtime.   6  . fluticasone (FLONASE) 50 MCG/ACT nasal spray Place 1 spray into both nostrils 2 (two) times daily.   1  . gabapentin (NEURONTIN) 300 MG capsule Take 300 mg by mouth 2 (two) times daily.    [name] lamoTRIgine (LAMICTAL) 150 MG tablet TAKE 1 TABLET(150 MG) BY MOUTH DAILY 90 tablet 0  . Lancet Devices (ADJUSTABLE LANCING DEVICE) MISC     . Melatonin 10 MG TABS Take 10 mg by mouth  at bedtime.    . metFORMIN (GLUCOPHAGE-XR) 500 MG 24 hr tablet Take 500 mg by mouth 3 (three) times a week. IN THE MORNING.    . methenamine (HIPREX) 1 g tablet Take 1 g by mouth at bedtime.     . Multiple Vitamin (MULTIVITAMIN WITH MINERALS) TABS tablet Take 1 tablet by mouth daily. Women's Multivitamin.    [name] MYRBETRIQ 25 MG TB24 tablet Take 25 mg by mouth at bedtime.     . nitrofurantoin, macrocrystal-monohydrate, (MACROBID) 100 MG capsule Take 100 mg by mouth at bedtime.    . ONE TOUCH ULTRA TEST test strip     . Polyethyl Glycol-Propyl Glycol (LUBRICANT EYE DROPS) 0.4-0.3 % SOLN Place 1-2 drops into both eyes 3 (three) times daily as needed (for dry eyes.).    [name] ranitidine (ZANTAC) 300 MG tablet Take 300 mg by mouth daily.    [name] spironolactone (ALDACTONE) 100 MG tablet Take 100 mg by mouth daily.   5  . tamsulosin (FLOMAX) 0.4 MG CAPS capsule Take 0.4 mg by mouth at bedtime.  11  .  traMADol (ULTRAM) 50 MG tablet Take 1 tablet (50 mg total) by mouth every 6 (six) hours as needed for moderate pain. 30 tablet 0  . triamterene-hydrochlorothiazide (MAXZIDE) 75-50 MG tablet Take 1 tablet by mouth daily.  3   No current facility-administered medications on file prior to visit.     ALLERGIES: Allergies  Allergen Reactions  . Miconazole Nitrate Hives and Itching  . Sulfonamide Derivatives Hives and Itching    FAMILY HISTORY: Family History  Problem Relation Age of Onset  . Suicidality Father   . Depression Father   . Alcohol abuse Father   . Emphysema Father        smoked  . Drug abuse Brother   . Alcohol abuse Brother   . Esophageal cancer Brother   . Depression Maternal Aunt   . Alcohol abuse Brother   . Anxiety disorder Daughter   . Depression Daughter   . Suicidality Daughter   . Alcohol abuse Daughter   . Drug abuse Son   . Alcohol abuse Son   . Emphysema Sister        smoked    SOCIAL HISTORY: Social History   Socioeconomic History  . Marital status: Divorced    Spouse name: Not on file  . Number of children: Not on file  . Years of education: graduate  . Highest education level: Not on file  Occupational History  . Occupation: Disabled    Employer: [employer]  Social Needs  . Financial resource strain: Not on file  . Food insecurity    Worry: Not on file    Inability: Not on file  . Transportation needs    Medical: Not on file    Non-medical: Not on file  Tobacco Use  . Smoking status: Former Smoker    Packs/day: 1.00    Years: 31.00    Pack years: 31.00    Types: Cigarettes    Quit date: 11/18/1999    Years since quitting: 19.6  . Smokeless tobacco: Never Used  Substance and Sexual Activity  . Alcohol use: No    Alcohol/week: 0.0 standard drinks  . Drug use: No  . Sexual activity: Never  Lifestyle  . Physical activity    Days per week: Not on file    Minutes per session: Not on file  . Stress: Not on file  Relationships   . Social connections  Talks on phone: Not on file    Gets together: Not on file    Attends religious service: Not on file    Active member of club or organization: Not on file    Attends meetings of clubs or organizations: Not on file    Relationship status: Not on file  . Intimate partner violence    Fear of current or ex partner: Not on file    Emotionally abused: Not on file    Physically abused: Not on file    Forced sexual activity: Not on file  Other Topics Concern  . Not on file  Social History Narrative   Right handed, Disabled. Live boyfriend , [name], Caffeine 3-4 cups.  Disabled.    REVIEW OF SYSTEMS: Constitutional: No fevers, chills, or sweats, no generalized fatigue, change in appetite Eyes: No visual changes, double vision, eye pain Ear, nose and throat: No hearing loss, ear pain, nasal congestion, sore throat Cardiovascular: No chest pain, palpitations Respiratory:  No shortness of breath at rest or with exertion, wheezes GastrointestinaI: No nausea, vomiting, diarrhea, abdominal pain, fecal incontinence Genitourinary:  No dysuria, urinary retention or frequency Musculoskeletal:  Neck pain Integumentary: No rash, pruritus, skin lesions Neurological: as above Psychiatric: depression, anxiety Endocrine: No palpitations, fatigue, diaphoresis, mood swings, change in appetite, change in weight, increased thirst Hematologic/Lymphatic:  No purpura, petechiae. Allergic/Immunologic: no itchy/runny eyes, nasal congestion, recent allergic reactions, rashes  PHYSICAL EXAM: Blood pressure 107/67, pulse 94, temperature 98.6 F (37 C), height _0  (1.702 m), weight 225 lb (102.1 kg), SpO2 97 %. General: No acute distress.  Patient appears well-groomed.   Head:  Normocephalic/atraumatic Eyes:  fundi examined but not visualized Neck: supple, paraspinal tenderness, full range of motion Back: No paraspinal tenderness Heart: regular rate and rhythm Lungs: Clear to  auscultation bilaterally. Vascular: No carotid bruits. Neurological Exam: Mental status: alert and oriented to person, place, and time, recent and remote memory intact, fund of knowledge intact, attention and concentration intact, speech fluent and not dysarthric, language intact. Cranial nerves: CN I: not tested CN II: pupils equal, round and reactive to light, visual fields intact CN III, IV, VI:  full range of motion, no nystagmus, no ptosis CN V: facial sensation intact CN VII: upper and lower face symmetric CN VIII: hearing intact CN IX, X: gag intact, uvula midline CN XI: sternocleidomastoid and trapezius muscles intact CN XII: tongue midline Bulk & Tone: increased tone in lower extremities Motor:  5/5 upper extremities, 3+/5 lower extremities Sensation:  temperature and vibration sensation intact.   Deep Tendon Reflexes:  2+ upper extremities, 3+ lower extremities, bilateral Babinski Finger to nose testing:  Without dysmetria.  Heel to shin:  Unable to assess Gait:  Nonambulatory.  Sits in motorized scooter.  IMPRESSION: 1.  Probable chronic tension type headache, not intractable 2.  Cervicalgia  PLAN: 1.  Will titrate tizanidine to 75m three times daily 2.  If headaches still frequent in 8 weeks, will discuss with Dr. AAdele Schilder(her psychiatrist) about possibly increasing amitriptyline. 3.  Limit use of pain relievers to no more than 2 days out of week to prevent risk of rebound or medication-overuse headache. 4.  Follow up in 4 months.  Thank you for allowing me to take part in the care of this patient.  AMetta Clines DO  CC: SJenean Lindau MD  NCyndi Bender PA-C

## 2019-07-02 ENCOUNTER — Encounter: Payer: Self-pay | Admitting: Neurology

## 2019-07-02 ENCOUNTER — Other Ambulatory Visit: Payer: Self-pay

## 2019-07-02 ENCOUNTER — Ambulatory Visit (INDEPENDENT_AMBULATORY_CARE_PROVIDER_SITE_OTHER): Payer: Medicare Other | Admitting: Neurology

## 2019-07-02 VITALS — BP 107/67 | HR 94 | Temp 98.6°F | Ht 67.0 in | Wt 225.0 lb

## 2019-07-02 DIAGNOSIS — M542 Cervicalgia: Secondary | ICD-10-CM | POA: Diagnosis not present

## 2019-07-02 DIAGNOSIS — G44229 Chronic tension-type headache, not intractable: Secondary | ICD-10-CM | POA: Diagnosis not present

## 2019-07-02 MED ORDER — TIZANIDINE HCL 4 MG PO TABS: 4.0000 mg | ORAL_TABLET | Freq: Three times a day (TID) | ORAL | 3 refills | Status: DC

## 2019-07-02 NOTE — Patient Instructions (Signed)
1.  I will have you slowly increase tizanidine 4mg  tablet:  Take 1/2 tablet in morning and 1 tablet at night for one week  Then 1/2 tablet in morning, 1/2 tablet in afternoon, and 1 tablet at night for one week  Then 1 tablet in morning, 1/2 tablet in afternoon, and 1 tablet at night for one week   Then 1 tablet three times daily. 2.  If headaches not improved in 8 weeks, we can consider increasing amitriptyline (I would discuss with Dr. Adele Schilder first) 3.  Limit use of pain relievers to no more than 2 days out of week to prevent risk of rebound or medication-overuse headache. 4.  Follow up in 4 months.

## 2019-07-09 DIAGNOSIS — R609 Edema, unspecified: Secondary | ICD-10-CM | POA: Diagnosis not present

## 2019-07-09 DIAGNOSIS — Z23 Encounter for immunization: Secondary | ICD-10-CM | POA: Diagnosis not present

## 2019-07-09 DIAGNOSIS — J439 Emphysema, unspecified: Secondary | ICD-10-CM | POA: Diagnosis not present

## 2019-07-09 DIAGNOSIS — E782 Mixed hyperlipidemia: Secondary | ICD-10-CM | POA: Diagnosis not present

## 2019-07-09 DIAGNOSIS — K760 Fatty (change of) liver, not elsewhere classified: Secondary | ICD-10-CM | POA: Diagnosis not present

## 2019-07-09 DIAGNOSIS — E118 Type 2 diabetes mellitus with unspecified complications: Secondary | ICD-10-CM | POA: Diagnosis not present

## 2019-07-13 ENCOUNTER — Telehealth: Payer: Self-pay | Admitting: Neurology

## 2019-07-13 NOTE — Telephone Encounter (Signed)
Patient called and said she has gotten conflicting information from different providers on how much tizanidine she is to be taking. She said a recent blood test showed she had too much in her system.

## 2019-07-14 NOTE — Telephone Encounter (Signed)
~  Dr. Tomi Likens what is the titration for this medication. Per you last office note  Will titrate to 4 mg three times a day?

## 2019-07-14 NOTE — Telephone Encounter (Signed)
I gave her a slow titration schedule to increase the tizanidine.  She should be following that schedule.  I don't know what she is referring to.

## 2019-07-14 NOTE — Telephone Encounter (Signed)
Called spoke with patient she was informed of provider response. She will have provider that order blood work to forward to out office. [facility]

## 2019-07-17 ENCOUNTER — Ambulatory Visit (INDEPENDENT_AMBULATORY_CARE_PROVIDER_SITE_OTHER): Payer: Medicare Other | Admitting: Psychiatry

## 2019-07-17 ENCOUNTER — Telehealth: Payer: Self-pay | Admitting: *Deleted

## 2019-07-17 ENCOUNTER — Other Ambulatory Visit: Payer: Self-pay

## 2019-07-17 ENCOUNTER — Encounter (HOSPITAL_COMMUNITY): Payer: Self-pay | Admitting: Psychiatry

## 2019-07-17 DIAGNOSIS — F319 Bipolar disorder, unspecified: Secondary | ICD-10-CM

## 2019-07-17 DIAGNOSIS — F419 Anxiety disorder, unspecified: Secondary | ICD-10-CM

## 2019-07-17 MED ORDER — LAMOTRIGINE 150 MG PO TABS: ORAL_TABLET | ORAL | 0 refills | Status: DC

## 2019-07-17 MED ORDER — AMITRIPTYLINE HCL 50 MG PO TABS: 50.0000 mg | ORAL_TABLET | Freq: Every day | ORAL | 0 refills | Status: DC

## 2019-07-17 MED ORDER — BUSPIRONE HCL 10 MG PO TABS: ORAL_TABLET | ORAL | 0 refills | Status: DC

## 2019-07-17 NOTE — Telephone Encounter (Signed)
MD received bloodwork from [facility].  Per MD - labs show mildly elevated LFT that are stable and from Dr. Georgie Chard standpoint she doesn't need to make changes to her tizantidine dose. Dr. Tomi Likens said if she still has concerns with it she can readdress with her PCP and PCP can contact Dr. Tomi Likens if needed. Patient called and made aware of above and verbalized understanding.
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
## 2019-07-20 DIAGNOSIS — R339 Retention of urine, unspecified: Secondary | ICD-10-CM | POA: Diagnosis not present

## 2019-07-23 DIAGNOSIS — E875 Hyperkalemia: Secondary | ICD-10-CM | POA: Diagnosis not present

## 2019-08-10 DIAGNOSIS — R339 Retention of urine, unspecified: Secondary | ICD-10-CM | POA: Diagnosis not present

## 2019-08-14 ENCOUNTER — Ambulatory Visit (INDEPENDENT_AMBULATORY_CARE_PROVIDER_SITE_OTHER): Payer: Medicare Other | Admitting: Physician Assistant

## 2019-08-14 ENCOUNTER — Encounter: Payer: Self-pay | Admitting: Physician Assistant

## 2019-08-14 VITALS — BP 90/60 | HR 84 | Temp 98.2°F

## 2019-08-14 DIAGNOSIS — K644 Residual hemorrhoidal skin tags: Secondary | ICD-10-CM | POA: Diagnosis not present

## 2019-08-14 DIAGNOSIS — K648 Other hemorrhoids: Secondary | ICD-10-CM | POA: Diagnosis not present

## 2019-08-14 MED ORDER — HYDROCORTISONE 1 % EX OINT: 1.0000 "application " | TOPICAL_OINTMENT | Freq: Two times a day (BID) | CUTANEOUS | 0 refills | Status: DC

## 2019-08-14 NOTE — Progress Notes (Signed)
I agree with the above note, plan 

## 2019-08-14 NOTE — Progress Notes (Signed)
Chief Complaint: Bright red blood per rectum  Review of pertinent gastrointestinal problems: 1. Increased risk for colon and gastric cancer given underlying acromegaly:  Colonoscopy, EGD 04/2012 Dr. Ardis Hughs were both normal, recommended recalls at 5 years. 2. Patient concern for parasitic infection in GI tract: 2015 visit, ava and parasite testing negative, she was still very concerned.  Consultation with ID specialist at [facility] 12/2014"no indication for further lab testing or treatment at this time"    HPI:    [name] is a 63 year old Caucasian female with a past medical history as listed below including acromegaly and COPD, wheelchair-bound, who was referred to me by Helen Hashimoto., MD for a complaint of red blood per rectum.      11/13/2016 office visit with Dr. Ardis Hughs to discuss blood in stools.  At that time it was discussed that she had a history of acromegaly which are slightly increased risk for colon cancer and gastric cancer.  Her last exams had been about 5 years ago.  She was scheduled for repeat colonoscopy and EGD.    11/29/2016 EGD completely normal, repeat in 5 years.  Colonoscopy on the same day with internal and external hemorrhoids and otherwise normal.  Repeat in 5 years.    Today, the patient presents clinic and explains that over the past few months she has seen some red streaks in her bowel movements and has just recently changed to being on the toilet paper when she wipes as well and sometimes in the toilet.  It is always with a bowel movement.  Last night was last time she saw blood.  She has normal soft solid bowel movement every 2 to 3 days.  No rectal pain.  No change in bowel habits.  No weight loss.    Denies fever, chills or symptoms that awaken her from sleep.  Past Medical History:  Diagnosis Date  . Acromegaly ([place])   . Anxiety   . Arthritis   . Bacterial infection   . Benign paroxysmal positional vertigo 02/24/2013  . Bipolar disorder ([place])   . Bladder  infection   . Cancer ([place])    skin cancer  . Cerebral palsy ([place])   . Colitis   . COPD (chronic obstructive pulmonary disease) (H[facility])    mild emphysema  . Depression    bi polar  . Diabetes mellitus, type II ([place])   . Dysplasia of cervix   . GERD (gastroesophageal reflux disease)   . Headache(784.0)   . High cholesterol   . History of chicken pox   . History of measles   . History of mumps   . IBS (irritable bowel syndrome)   . Kidney infection   . Melanoma in situ ([place])   . Neck mass   . Neuromuscular disorder ([place])   . Ovarian cyst   . Poor circulation   . Stenosis, cervical spine 05/16  . Trichomonas   . UTI (urinary tract infection)    current  . Yeast infection     Past Surgical History:  Procedure Laterality Date  . ANTERIOR CERVICAL DECOMP/DISCECTOMY FUSION N/A 05/25/2016   Procedure: Cervical five-six Anterior cervical decompression/diskectomy/fusion;  Surgeon: Ashok Pall, MD;  Location: [facility];  Service: Neurosurgery;  Laterality: N/A;  . APPENDECTOMY    . CARPAL TUNNEL RELEASE    . CARPAL TUNNEL RELEASE Right 08/02/2017   Procedure: CARPAL TUNNEL RELEASE RIGHT;  Surgeon: Ashok Pall, MD;  Location: [facility];  Service: Neurosurgery;  Laterality: Right;  CARPAL  TUNNEL RELEASE RIGHT  . CARPAL TUNNEL RELEASE Left 11/29/2017   Procedure: CARPAL TUNNEL RELEASE LEFT;  Surgeon: Ashok Pall, MD;  Location: [facility];  Service: Neurosurgery;  Laterality: Left;  CARPAL TUNNEL RELEASE LEFT  . COLONOSCOPY WITH PROPOFOL N/A 11/29/2016   Procedure: COLONOSCOPY WITH PROPOFOL;  Surgeon: [name], MD;  Location: [facility];  Service: Endoscopy;  Laterality: N/A;  . ESOPHAGOGASTRODUODENOSCOPY (EGD) WITH PROPOFOL N/A 11/29/2016   Procedure: ESOPHAGOGASTRODUODENOSCOPY (EGD) WITH PROPOFOL;  Surgeon: [name], MD;  Location: [facility];  Service: Endoscopy;  Laterality: N/A;  . JOINT REPLACEMENT    . knee rotation    . LEG TENDON SURGERY    . OVARIAN CYST REMOVAL     . REPLACEMENT TOTAL KNEE BILATERAL  2006  . rotator cuff surgery      Current Outpatient Medications  Medication Sig Dispense Refill  . albuterol (PROVENTIL HFA;VENTOLIN HFA) 108 (90 Base) MCG/ACT inhaler Inhale 1-2 puffs into the lungs every 6 (six) hours as needed for wheezing or shortness of breath.    . Alcohol Swabs (ALCOHOL PREP) 70 % PADS     . alendronate (FOSAMAX) 70 MG tablet Take 70 mg by mouth every Monday. Take with a full glass of water on an empty stomach.    [name] amitriptyline (ELAVIL) 50 MG tablet Take 1 tablet (50 mg total) by mouth at bedtime. 90 tablet 0  . AQUALANCE LANCETS 30G MISC     . Ascorbic Acid (VITAMIN C) 500 MG CAPS Take 500 mg by mouth at bedtime.     [name] atorvastatin (LIPITOR) 40 MG tablet Take 40 mg by mouth daily.    . Blood Glucose Calibration (OT ULTRA/FASTTK CNTRL SOLN) SOLN     . Blood Glucose Monitoring Suppl (ONE TOUCH ULTRA 2) w/Device KIT     . busPIRone (BUSPAR) 10 MG tablet TAKE 1 TABLET(10 MG) BY MOUTH TWICE DAILY 180 tablet 0  . Calcium Carb-Cholecalciferol (CALCIUM 600+D3 PO) Take 1 tablet by mouth daily.    . cephALEXin (KEFLEX) 250 MG capsule Take 250 mg by mouth at bedtime.   6  . esomeprazole (NEXIUM) 40 MG capsule TK ONE C PO D FOR ANTIACID    . fluticasone (FLONASE) 50 MCG/ACT nasal spray Place 1 spray into both nostrils 2 (two) times daily.   1  . furosemide (LASIX) 40 MG tablet Take 1 tablet by mouth daily.    [name] gabapentin (NEURONTIN) 300 MG capsule Take 300 mg by mouth 2 (two) times daily.    [name] glipiZIDE (GLUCOTROL XL) 10 MG 24 hr tablet TK 1 T PO QAM FOR DIABETES    . lamoTRIgine (LAMICTAL) 150 MG tablet TAKE 1 TABLET(150 MG) BY MOUTH DAILY 90 tablet 0  . Lancet Devices (ADJUSTABLE LANCING DEVICE) MISC     . Melatonin 10 MG TABS Take 10 mg by mouth at bedtime.    . metFORMIN (GLUCOPHAGE-XR) 500 MG 24 hr tablet Take 500 mg by mouth daily with breakfast. IN THE MORNING.    . methenamine (HIPREX) 1 g tablet Take 1 g by mouth at bedtime.      . Multiple Vitamin (MULTIVITAMIN WITH MINERALS) TABS tablet Take 1 tablet by mouth daily. Women's Multivitamin.    [name] MYRBETRIQ 25 MG TB24 tablet Take 25 mg by mouth at bedtime.     . nitrofurantoin, macrocrystal-monohydrate, (MACROBID) 100 MG capsule Take 100 mg by mouth at bedtime.    . ONE TOUCH ULTRA TEST test strip     . Polyethyl  Glycol-Propyl Glycol (LUBRICANT EYE DROPS) 0.4-0.3 % SOLN Place 1-2 drops into both eyes 3 (three) times daily as needed (for dry eyes.).    [name] ranitidine (ZANTAC) 300 MG tablet Take 300 mg by mouth daily.    [name] spironolactone (ALDACTONE) 100 MG tablet Take 100 mg by mouth daily.   5  . tamsulosin (FLOMAX) 0.4 MG CAPS capsule Take 0.4 mg by mouth at bedtime.  11  . tiZANidine (ZANAFLEX) 4 MG tablet Take 1 tablet (4 mg total) by mouth 3 (three) times daily. 90 tablet 3  . traMADol (ULTRAM) 50 MG tablet Take 1 tablet (50 mg total) by mouth every 6 (six) hours as needed for moderate pain. 30 tablet 0  . triamterene-hydrochlorothiazide (MAXZIDE) 75-50 MG tablet Take 1 tablet by mouth daily.  3   No current facility-administered medications for this visit.     Allergies as of 08/14/2019 - Review Complete 08/14/2019  Allergen Reaction Noted  . Miconazole nitrate Hives and Itching 11/21/2011  . Sulfonamide derivatives Hives and Itching     Family History  Problem Relation Age of Onset  . Suicidality Father   . Depression Father   . Alcohol abuse Father   . Emphysema Father        smoked  . Drug abuse Brother   . Alcohol abuse Brother   . Esophageal cancer Brother   . Depression Maternal Aunt   . Alcohol abuse Brother   . Anxiety disorder Daughter   . Depression Daughter   . Suicidality Daughter   . Alcohol abuse Daughter   . Drug abuse Son   . Alcohol abuse Son   . Emphysema Sister        smoked    Social History   Socioeconomic History  . Marital status: Divorced    Spouse name: Not on file  . Number of children: Not on file  . Years of  education: graduate  . Highest education level: Not on file  Occupational History  . Occupation: Disabled    Employer: [employer]  Social Needs  . Financial resource strain: Not on file  . Food insecurity    Worry: Not on file    Inability: Not on file  . Transportation needs    Medical: Not on file    Non-medical: Not on file  Tobacco Use  . Smoking status: Former Smoker    Packs/day: 1.00    Years: 31.00    Pack years: 31.00    Types: Cigarettes    Quit date: 11/18/1999    Years since quitting: 19.7  . Smokeless tobacco: Never Used  Substance and Sexual Activity  . Alcohol use: Yes    Alcohol/week: 0.0 standard drinks    Comment: occ.  . Drug use: No  . Sexual activity: Never  Lifestyle  . Physical activity    Days per week: Not on file    Minutes per session: Not on file  . Stress: Not on file  Relationships  . Social Herbalist on phone: Not on file    Gets together: Not on file    Attends religious service: Not on file    Active member of club or organization: Not on file    Attends meetings of clubs or organizations: Not on file    Relationship status: Not on file  . Intimate partner violence    Fear of current or ex partner: Not on file    Emotionally abused: Not on file  Physically abused: Not on file    Forced sexual activity: Not on file  Other Topics Concern  . Not on file  Social History Narrative   Right handed, Disabled. Live boyfriend , [name], Caffeine rare.  Disabled.   One level home. Bachelors degree    Review of Systems:    Constitutional: No weight loss, fever or chills Cardiovascular: No chest pain  Respiratory: No SOB  Gastrointestinal: See HPI and otherwise negative   Physical Exam:  Vital signs: BP 90/60 (BP Location: Left Arm, Patient Position: Sitting, Cuff Size: Large)   Pulse 84   Temp 98.2 F (36.8 C)   Constitutional:   Pleasant overweight Caucasian female appears to be in NAD, Well developed, Well  nourished, alert and cooperative Respiratory: Respirations even and unlabored. Lungs clear to auscultation bilaterally.   No wheezes, crackles, or rhonchi.  Cardiovascular: Normal S1, S2. No MRG. Regular rate and rhythm. No peripheral edema, cyanosis or pallor.  Gastrointestinal:  Soft, nondistended, nontender. No rebound or guarding. Normal bowel sounds. No appreciable masses or hepatomegaly. Rectal:  External: external hemorrhoid; Internal: no mass, brown stool discharge; Anoscopy: Grade II internal hemorrhoids Msk:  Symmetrical without gross deformities. Unable to use legs, wheelchair dependent Psychiatric: Demonstrates good judgement and reason without abnormal affect or behaviors.  RELEVANT LABS AND IMAGING: CBC    Component Value Date/Time   WBC 10.6 (H) 07/30/2017 1117   RBC 5.02 07/30/2017 1117   HGB 15.6 (H) 11/29/2017 0923   HCT 43.2 07/30/2017 1117   PLT 309 07/30/2017 1117   MCV 86.1 07/30/2017 1117   MCH 27.3 07/30/2017 1117   MCHC 31.7 07/30/2017 1117   RDW 13.6 07/30/2017 1117   LYMPHSABS 2.3 12/15/2015 1428   MONOABS 0.6 12/15/2015 1428   EOSABS 0.1 12/15/2015 1428   BASOSABS 0.0 12/15/2015 1428    CMP     Component Value Date/Time   NA 135 11/29/2017 0923   K 4.7 11/29/2017 0923   CL 99 (L) 11/29/2017 0923   CO2 23 11/29/2017 0923   GLUCOSE 143 (H) 11/29/2017 0923   GLUCOSE 105 (H) 09/30/2006 1552   BUN 15 11/29/2017 0923   CREATININE 0.94 11/29/2017 0923   CREATININE 0.69 12/15/2015 1428   CALCIUM 9.7 11/29/2017 0923   PROT 7.1 12/15/2015 1428   ALBUMIN 4.5 12/15/2015 1428   AST 20 12/15/2015 1428   ALT 28 12/15/2015 1428   ALKPHOS 80 12/15/2015 1428   BILITOT 0.4 12/15/2015 1428   GFRNONAA >60 11/29/2017 0923   GFRNONAA >89 12/15/2015 1428   GFRAA >60 11/29/2017 0923   GFRAA >89 12/15/2015 1428    Assessment: 1.  Bleeding internal hemorrhoids: Likely cause of patient's bright red blood per rectum, recent colonoscopy in 2018 with internal and  external hemorrhoids and otherwise normal 2.  External hemorrhoids  Plan: 1.  Discussed with patient that she has had a recent colonoscopy, with visualized bleeding internal hemorrhoids today she does not need a repeat.  Her next surveillance is due in 3 years. 2.  Prescribed Hydrocortisone ointment to be applied Preparation H suppositories twice daily x7 days, patient can repeat for a week if needed.  Also recommend she apply some Hydrocortisone ointment around her rectum for external hemorrhoids. 3.   Did discuss with patient that if she sees a lot more bleeding or if it continues after the steroid ointment or she becomes concerned she should give [place] a call. 4.  Patient to follow in clinic with Dr. Ardis Hughs or myself  as needed in the future.  Ellouise Newer, PA-C [facility] 08/14/2019, 11:24 AM  Cc: Helen Hashimoto., MD

## 2019-08-14 NOTE — Patient Instructions (Addendum)
If you are age 62 or older, your body mass index should be between 23-30. Your There is no height or weight on file to calculate BMI. If this is out of the aforementioned range listed, please consider follow up with your Primary Care Provider.  If you are age 91 or younger, your body mass index should be between 19-25. Your There is no height or weight on file to calculate BMI. If this is out of the aformentioned range listed, please consider follow up with your Primary Care Provider.  Please use the hydrocortisone ointment twice a day, also place some around her rectum.  Please get some prep-h over the counter and use twice a day.   Repeat x 1 week.   It was a pleasure to see you today!  Thank you for choosing me and [facility].    Ellouise Newer, PA-C

## 2019-08-24 ENCOUNTER — Telehealth: Payer: Self-pay | Admitting: Neurology

## 2019-08-24 ENCOUNTER — Telehealth (HOSPITAL_COMMUNITY): Payer: Self-pay

## 2019-08-24 NOTE — Telephone Encounter (Signed)
See below

## 2019-08-24 NOTE — Telephone Encounter (Signed)
Patient called regarding her Amitriptyline 50mg . She stated that she's having headaches and gets very sleepy. She also stated that she spoke with Dr. Tomi Likens who is prescribing her Tizanidine 4mg . He suggested patient call you to increase her Amitriptyline from 50mg  to 75mg . Please review and advise. Thank you.

## 2019-08-24 NOTE — Telephone Encounter (Signed)
Yes, I would discontinue tizanidine (or at least decrease it back to original dose).  Instead, we can increase amitriptyline to 75mg  at bedtime.
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
## 2019-08-24 NOTE — Telephone Encounter (Signed)
Called spoke with patient she was informed of provider response below. She will stop the tizanidine and will increase the amitriptyline 75 mg she states that Dr. Berniece Andreas writes this medication for her she will informed him of the changes. If she needs for our office to send in new rx for increase she will call back for new rx for increase dose.

## 2019-08-25 NOTE — Telephone Encounter (Signed)
We can try Amitriptyline to 75 mg but this may cause sedation too. She should not take with tizanidine.  Please call Amitriptyline 75 at bed time for 30 days to see if she can tolerate the dose and headache improves.

## 2019-08-28 ENCOUNTER — Other Ambulatory Visit (HOSPITAL_COMMUNITY): Payer: Self-pay

## 2019-08-28 MED ORDER — AMITRIPTYLINE HCL 75 MG PO TABS: 75.0000 mg | ORAL_TABLET | Freq: Every day | ORAL | 0 refills | Status: DC

## 2019-08-28 NOTE — Telephone Encounter (Signed)
Done

## 2019-08-31 DIAGNOSIS — R339 Retention of urine, unspecified: Secondary | ICD-10-CM | POA: Diagnosis not present

## 2019-09-04 ENCOUNTER — Ambulatory Visit: Payer: Medicare Other | Admitting: Obstetrics & Gynecology

## 2019-09-30 ENCOUNTER — Telehealth (HOSPITAL_COMMUNITY): Payer: Self-pay

## 2019-09-30 DIAGNOSIS — R339 Retention of urine, unspecified: Secondary | ICD-10-CM | POA: Diagnosis not present

## 2019-09-30 MED ORDER — AMITRIPTYLINE HCL 75 MG PO TABS: 75.0000 mg | ORAL_TABLET | Freq: Every day | ORAL | 0 refills | Status: DC

## 2019-09-30 NOTE — Telephone Encounter (Signed)
Medication refill - Fax received from pt's [facility] for a refill of her prescibed Amitriptyline.  Patient returns 10/15/19.

## 2019-09-30 NOTE — Telephone Encounter (Signed)
Done

## 2019-10-02 ENCOUNTER — Ambulatory Visit: Payer: Medicare Other | Admitting: Obstetrics & Gynecology

## 2019-10-08 DIAGNOSIS — K760 Fatty (change of) liver, not elsewhere classified: Secondary | ICD-10-CM | POA: Diagnosis not present

## 2019-10-08 DIAGNOSIS — R609 Edema, unspecified: Secondary | ICD-10-CM | POA: Diagnosis not present

## 2019-10-08 DIAGNOSIS — J439 Emphysema, unspecified: Secondary | ICD-10-CM | POA: Diagnosis not present

## 2019-10-08 DIAGNOSIS — E782 Mixed hyperlipidemia: Secondary | ICD-10-CM | POA: Diagnosis not present

## 2019-10-08 DIAGNOSIS — E118 Type 2 diabetes mellitus with unspecified complications: Secondary | ICD-10-CM | POA: Diagnosis not present

## 2019-10-12 DIAGNOSIS — E119 Type 2 diabetes mellitus without complications: Secondary | ICD-10-CM | POA: Diagnosis not present

## 2019-10-12 DIAGNOSIS — H2513 Age-related nuclear cataract, bilateral: Secondary | ICD-10-CM | POA: Diagnosis not present

## 2019-10-15 ENCOUNTER — Encounter (HOSPITAL_COMMUNITY): Payer: Self-pay | Admitting: Psychiatry

## 2019-10-15 ENCOUNTER — Other Ambulatory Visit: Payer: Self-pay

## 2019-10-15 ENCOUNTER — Ambulatory Visit (INDEPENDENT_AMBULATORY_CARE_PROVIDER_SITE_OTHER): Payer: Medicare Other | Admitting: Psychiatry

## 2019-10-15 DIAGNOSIS — F419 Anxiety disorder, unspecified: Secondary | ICD-10-CM

## 2019-10-15 DIAGNOSIS — F319 Bipolar disorder, unspecified: Secondary | ICD-10-CM

## 2019-10-15 MED ORDER — LAMOTRIGINE 150 MG PO TABS: ORAL_TABLET | ORAL | 0 refills | Status: DC

## 2019-10-15 MED ORDER — AMITRIPTYLINE HCL 75 MG PO TABS: 75.0000 mg | ORAL_TABLET | Freq: Every day | ORAL | 0 refills | Status: DC

## 2019-10-15 MED ORDER — BUSPIRONE HCL 10 MG PO TABS: ORAL_TABLET | ORAL | 0 refills | Status: DC
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
## 2019-10-19 DIAGNOSIS — R310 Gross hematuria: Secondary | ICD-10-CM | POA: Diagnosis not present

## 2019-10-19 DIAGNOSIS — R3914 Feeling of incomplete bladder emptying: Secondary | ICD-10-CM | POA: Diagnosis not present

## 2019-10-19 DIAGNOSIS — R8279 Other abnormal findings on microbiological examination of urine: Secondary | ICD-10-CM | POA: Diagnosis not present

## 2019-11-01 DIAGNOSIS — R339 Retention of urine, unspecified: Secondary | ICD-10-CM | POA: Diagnosis not present

## 2019-11-09 ENCOUNTER — Encounter: Payer: Self-pay | Admitting: Neurology

## 2019-11-09 NOTE — Progress Notes (Signed)
Due to the COVID-19 crisis, this virtual check-in visit was done via telephone from my office and it was initiated and consent given by this patient and or family.   Virtual Check in Visit The purpose of this virtual visit is to provide medical care while limiting exposure to the novel coronavirus.    Consent was obtained for virtual check in visit and initiated by pt/family:  Yes.   Answered questions that patient had about telehealth interaction:  Yes.   I discussed the limitations, risks, security and privacy concerns of performing an evaluation and management service by telephone. I also discussed with the patient that there may be a patient responsible charge related to this service. The patient expressed understanding and agreed to proceed.  Pt location: Home Physician Location: office Name of referring provider:  Helen Hashimoto., MD I connected with .[name] at patients initiation/request on 11/10/2019 at 10:50 AM EST by telephone and verified that I am speaking with the correct person using two identifiers.  Pt MRN:  [id number] Pt DOB:  July 29, 1956  History of Present Illness:  [name] is a 64 year old female with cerebral palsy, acromegaly, cervical spinal stenosis s/p ACDF, s/p bilateral carpal tunnel surgery, Bipolar 1 disorder, and COPD who follows up for headache.  UPDATE: Increased tizanidine made her sleepy.  Amitriptyline was then increased to 75mg  at bedtime.  Her psychiatrist, Dr. Adele Schilder, was made aware.  Headaches are much improved. Intensity:  moderate Duration:  Very brief (seconds), not requiring acute management Frequency:  Once a month Frequency of abortive medication: none Current NSAIDS:  none Current analgesics:  Tramadol (rarely uses) Current triptans:  none Current ergotamine:  none Current anti-emetic:  none Current muscle relaxants:  none Current anti-anxiolytic:  BuSpar Current sleep aide:  melatonin Current Antihypertensive  medications:  Spironolactone, Maxzide Current Antidepressant medications:  Amitriptyline 75mg  Current Anticonvulsant medications:  Lamotrigine 150mg  daily, gabapentin 300mg  twice daily Current anti-CGRP:  none Current Vitamins/Herbal/Supplements:  Melatonin, MVI Current Antihistamines/Decongestants:  Flonase Other therapy:  none  Caffeine:  Rarely drinks coffee.  Drinks a Coke about twice a daily.  Drinks tea. Diet:  Soda.  No water. Depression:  mild; Anxiety:  yes Other pain:  Neck pain Sleep hygiene:  good  HISTORY: Onset:  Several years Location:  Retro-orbital, right worse than left  Quality:  Throbbing/burning. Initial intensity:  8/10.  She denies new headache, thunderclap headache Sometimes has burning on crown.  She does have chronic neck pain s/p surgery. Aura:  none Premonitory Phase:  none Postdrome:  none Associated symptoms:  None.  She denies associated nausea, vomiting, photophobia, phonophobia, visual disturbance or unilateral numbness or weakness. Initial duration:  Off and on throughout the day Initial frequency:  1 or 2 days a week. Triggers:  none Exacerbating factors:  none Relieving factors:  no Activity:  Able to function.  MRI/MRA of brain from 03/10/13 personally reviewed and demonstrated tiny nonspecific pervintricular and subcortical white matter changes.  Repeat MRI of brain from 09/08/14 personally reviewed and stable.  For cervical and occipital pain, she had an MRI of the cervical spine on 02/11/15, which showed multilevel left-sided foraminal stenosis, including at C6-7, as well as bilaterally at C5-6, and with moderate central canal stenosis at C5-6. For cervical radiculitisshe is treated by her orthopedist/pain specialist.     Past NSAIDS:  naproxen Past analgesics:  Tylenol, Excedrin Migraine Past abortive triptans:  none Past abortive ergotamine:  none Past muscle relaxants:  Flexeril; tizanidine  4mg  three times daily (drowsiness) Past  anti-emetic:  Promethazine, Zofran ODT 8mg  Past antihypertensive medications:  none Past antidepressant medications:  Zoloft Past anticonvulsant medications:  none Past anti-CGRP:  none Past vitamins/Herbal/Supplements:  none Past antihistamines/decongestants:  Benadryl Other past therapies:  none   Family history of headache:  sister   Observations/Objective:   There were no vitals filed for this visit.  Assessment and Plan:   Tension-type headaches, not intractable.  Significantly improved.  1.  Amitriptyline 75mg  at bedtime 2.  Limit use of pain relievers to no more than 2 days out of week to prevent risk of rebound or medication-overuse headache. 3.  Headache diary 4.  Follow up in 5 months.  Follow Up Instructions:    -I discussed the assessment and treatment plan with the patient. The patient was provided an opportunity to ask questions and all were answered. The patient agreed with the plan and demonstrated an understanding of the instructions.   The patient was advised to call back or seek an in-person evaluation if the symptoms worsen or if the condition fails to improve as anticipated.    Total Time spent in visit with the patient was:  8 minutes  Dudley Major, DO

## 2019-11-10 ENCOUNTER — Encounter: Payer: Self-pay | Admitting: Neurology

## 2019-11-10 ENCOUNTER — Telehealth (INDEPENDENT_AMBULATORY_CARE_PROVIDER_SITE_OTHER): Payer: Medicare Other | Admitting: Neurology

## 2019-11-10 ENCOUNTER — Other Ambulatory Visit: Payer: Self-pay

## 2019-11-10 VITALS — Ht 67.0 in | Wt 225.0 lb

## 2019-11-10 DIAGNOSIS — G44219 Episodic tension-type headache, not intractable: Secondary | ICD-10-CM | POA: Diagnosis not present

## 2019-11-11 DIAGNOSIS — H5213 Myopia, bilateral: Secondary | ICD-10-CM | POA: Diagnosis not present

## 2019-11-11 DIAGNOSIS — H524 Presbyopia: Secondary | ICD-10-CM | POA: Diagnosis not present

## 2019-11-11 DIAGNOSIS — H52203 Unspecified astigmatism, bilateral: Secondary | ICD-10-CM | POA: Diagnosis not present

## 2019-11-17 ENCOUNTER — Ambulatory Visit: Payer: Medicare Other | Admitting: Obstetrics & Gynecology

## 2019-11-19 DIAGNOSIS — Z1231 Encounter for screening mammogram for malignant neoplasm of breast: Secondary | ICD-10-CM | POA: Diagnosis not present

## 2019-11-28 ENCOUNTER — Other Ambulatory Visit: Payer: Self-pay

## 2019-11-28 ENCOUNTER — Ambulatory Visit (HOSPITAL_COMMUNITY)
Admission: EM | Admit: 2019-11-28 | Discharge: 2019-11-28 | Disposition: A | Discharge status: Home / Self Care | Payer: Medicare Other | Attending: Family Medicine | Admitting: Family Medicine

## 2019-11-28 ENCOUNTER — Encounter (HOSPITAL_COMMUNITY): Payer: Self-pay

## 2019-11-28 DIAGNOSIS — R0602 Shortness of breath: Secondary | ICD-10-CM

## 2019-11-28 DIAGNOSIS — R05 Cough: Secondary | ICD-10-CM

## 2019-11-28 DIAGNOSIS — R0981 Nasal congestion: Secondary | ICD-10-CM | POA: Insufficient documentation

## 2019-11-28 DIAGNOSIS — Z978 Presence of other specified devices: Secondary | ICD-10-CM | POA: Insufficient documentation

## 2019-11-28 DIAGNOSIS — Z20822 Contact with and (suspected) exposure to covid-19: Secondary | ICD-10-CM | POA: Diagnosis not present

## 2019-11-28 DIAGNOSIS — R82998 Other abnormal findings in urine: Secondary | ICD-10-CM

## 2019-11-28 LAB — POCT URINALYSIS DIP (DEVICE)
Bilirubin Urine: NEGATIVE
Glucose, UA: NEGATIVE mg/dL
Ketones, ur: NEGATIVE mg/dL
Nitrite: NEGATIVE
Protein, ur: 100 mg/dL — AB
Specific Gravity, Urine: 1.02 (ref 1.005–1.030)
Urobilinogen, UA: 0.2 mg/dL (ref 0.0–1.0)
pH: 7 (ref 5.0–8.0)

## 2019-11-28 MED ORDER — CEFUROXIME AXETIL 500 MG PO TABS: 500.0000 mg | ORAL_TABLET | Freq: Two times a day (BID) | ORAL | 0 refills | Status: AC

## 2019-11-28 NOTE — Discharge Instructions (Signed)
Please isolate until your covid testing is back.  Over the counter  medications as needed for symptoms.  Push fluids to ensure adequate hydration and keep secretions thin.  Tylenol and/or ibuprofen as needed for pain or fevers.  You may try some vitamins to help your immune system potentially:  Vitamin C 500mg  twice a day. Zinc 50mg  daily. Vitamin D 5000IU daily.   I will start antibiotics for your urine pending the results of your urine culture.  If your culture is negative you may stop the antibiotics.  If symptoms worsen or do not improve in the next week to return to be seen or to follow up with your PCP.

## 2019-11-28 NOTE — ED Triage Notes (Signed)
Pt c/o urine "being very dark"; pt has foley cath in place at all times.  Denies any abd pain or fevers. Also c/o sinus pressure and congestion.  States family member has tested positive for Covid.  Pt returned to waiting area to await appropriate exam room.
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
## 2019-11-29 LAB — NOVEL CORONAVIRUS, NAA (HOSP ORDER, SEND-OUT TO REF LAB; TAT 18-24 HRS): SARS-CoV-2, NAA: NOT DETECTED

## 2019-11-29 LAB — URINE CULTURE

## 2019-12-08 DIAGNOSIS — R928 Other abnormal and inconclusive findings on diagnostic imaging of breast: Secondary | ICD-10-CM | POA: Diagnosis not present

## 2019-12-08 DIAGNOSIS — N6002 Solitary cyst of left breast: Secondary | ICD-10-CM | POA: Diagnosis not present

## 2019-12-08 DIAGNOSIS — N6001 Solitary cyst of right breast: Secondary | ICD-10-CM | POA: Diagnosis not present

## 2019-12-16 DIAGNOSIS — R339 Retention of urine, unspecified: Secondary | ICD-10-CM | POA: Diagnosis not present

## 2019-12-18 ENCOUNTER — Other Ambulatory Visit: Payer: Self-pay

## 2019-12-18 ENCOUNTER — Ambulatory Visit: Payer: Self-pay

## 2019-12-18 ENCOUNTER — Ambulatory Visit (INDEPENDENT_AMBULATORY_CARE_PROVIDER_SITE_OTHER): Payer: Medicare Other | Admitting: Orthopaedic Surgery

## 2019-12-18 ENCOUNTER — Encounter: Payer: Self-pay | Admitting: Orthopaedic Surgery

## 2019-12-18 VITALS — Ht 67.0 in | Wt 225.0 lb

## 2019-12-18 DIAGNOSIS — S161XXA Strain of muscle, fascia and tendon at neck level, initial encounter: Secondary | ICD-10-CM

## 2019-12-18 MED ORDER — METHOCARBAMOL 500 MG PO TABS: 500.0000 mg | ORAL_TABLET | Freq: Two times a day (BID) | ORAL | 0 refills | Status: DC | PRN

## 2019-12-18 MED ORDER — PREDNISONE 10 MG (21) PO TBPK: ORAL_TABLET | ORAL | 0 refills | Status: DC
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
## 2020-01-01 ENCOUNTER — Telehealth: Payer: Self-pay | Admitting: Orthopaedic Surgery

## 2020-01-01 NOTE — Telephone Encounter (Signed)
Pt called stating she had an appt a couple weeks ago and saw Dr. Ericka Pontiff PA and was told she would receive a call to schedule an appt for an injection in her left shoulder; however she hasn't heard anything back.   [phone]

## 2020-01-01 NOTE — Telephone Encounter (Signed)
neck

## 2020-01-01 NOTE — Telephone Encounter (Signed)
See below, were you wanting an injection in her neck or shoulder?

## 2020-01-04 ENCOUNTER — Other Ambulatory Visit: Payer: Self-pay

## 2020-01-04 DIAGNOSIS — M542 Cervicalgia: Secondary | ICD-10-CM

## 2020-01-04 DIAGNOSIS — S161XXA Strain of muscle, fascia and tendon at neck level, initial encounter: Secondary | ICD-10-CM

## 2020-01-04 NOTE — Telephone Encounter (Signed)
Sent order to [facility] for an injection for patient

## 2020-01-06 ENCOUNTER — Other Ambulatory Visit (HOSPITAL_COMMUNITY)
Admission: RE | Admit: 2020-01-06 | Discharge: 2020-01-06 | Disposition: A | Discharge status: Home / Self Care | Payer: Medicare HMO | Source: Ambulatory Visit | Attending: Obstetrics & Gynecology | Admitting: Obstetrics & Gynecology

## 2020-01-06 ENCOUNTER — Encounter: Payer: Self-pay | Admitting: Obstetrics & Gynecology

## 2020-01-06 ENCOUNTER — Ambulatory Visit (INDEPENDENT_AMBULATORY_CARE_PROVIDER_SITE_OTHER): Payer: Medicare HMO | Admitting: Obstetrics & Gynecology

## 2020-01-06 ENCOUNTER — Other Ambulatory Visit: Payer: Self-pay

## 2020-01-06 VITALS — BP 123/79 | HR 103

## 2020-01-06 DIAGNOSIS — Z1151 Encounter for screening for human papillomavirus (HPV): Secondary | ICD-10-CM | POA: Diagnosis not present

## 2020-01-06 DIAGNOSIS — Z01419 Encounter for gynecological examination (general) (routine) without abnormal findings: Secondary | ICD-10-CM | POA: Diagnosis not present

## 2020-01-06 DIAGNOSIS — Z780 Asymptomatic menopausal state: Secondary | ICD-10-CM | POA: Diagnosis not present

## 2020-01-06 DIAGNOSIS — Z23 Encounter for immunization: Secondary | ICD-10-CM | POA: Diagnosis not present

## 2020-01-06 NOTE — Progress Notes (Signed)
GYNECOLOGY ANNUAL PREVENTATIVE CARE ENCOUNTER NOTE  History:     [name] is a 64 y.o. G3P2 PMP female with cerebral palsy and limited mobility on wheelchair, here for a routine annual gynecologic exam.  Current complaints: none*.   Denies abnormal vaginal bleeding, discharge, pelvic pain, problems with intercourse or other gynecologic concerns.    Gynecologic History No LMP recorded. Patient is postmenopausal. Contraception: post menopausal status Last Pap: 2016. Results were: normal with negative HPV Last mammogram: 11/2019 at [facility]. Results were: normal  Obstetric History OB History  Gravida Para Term Preterm AB Living  3 2       2   SAB TAB Ectopic Multiple Live Births               # Outcome Date GA Lbr Len/2nd Weight Sex Delivery Anes PTL Lv  3 Para           2 Para           1 Gravida             Past Medical History:  Diagnosis Date  . Acromegaly ([place])   . Anxiety   . Arthritis   . Bacterial infection   . Benign paroxysmal positional vertigo 02/24/2013  . Bipolar disorder ([place])   . Bladder infection   . Cancer ([place])    skin cancer  . Cerebral palsy ([place])   . Colitis   . COPD (chronic obstructive pulmonary disease) (HCC)    mild emphysema  . Depression    bi polar  . Diabetes mellitus, type II ([place])   . Dysplasia of cervix   . GERD (gastroesophageal reflux disease)   . Headache(784.0)   . High cholesterol   . History of chicken pox   . History of measles   . History of mumps   . IBS (irritable bowel syndrome)   . Kidney infection   . Melanoma in situ ([place])   . Neck mass   . Neuromuscular disorder ([place])   . Ovarian cyst   . Poor circulation   . Stenosis, cervical spine 05/16  . Trichomonas   . UTI (urinary tract infection)    current  . Yeast infection     Past Surgical History:  Procedure Laterality Date  . ANTERIOR CERVICAL DECOMP/DISCECTOMY FUSION N/A 05/25/2016   Procedure: Cervical five-six Anterior cervical  decompression/diskectomy/fusion;  Surgeon: Ashok Pall, MD;  Location: [facility];  Service: Neurosurgery;  Laterality: N/A;  . APPENDECTOMY    . CARPAL TUNNEL RELEASE    . CARPAL TUNNEL RELEASE Right 08/02/2017   Procedure: CARPAL TUNNEL RELEASE RIGHT;  Surgeon: Ashok Pall, MD;  Location: [facility];  Service: Neurosurgery;  Laterality: Right;  CARPAL TUNNEL RELEASE RIGHT  . CARPAL TUNNEL RELEASE Left 11/29/2017   Procedure: CARPAL TUNNEL RELEASE LEFT;  Surgeon: Ashok Pall, MD;  Location: [facility];  Service: Neurosurgery;  Laterality: Left;  CARPAL TUNNEL RELEASE LEFT  . COLONOSCOPY WITH PROPOFOL N/A 11/29/2016   Procedure: COLONOSCOPY WITH PROPOFOL;  Surgeon: [name], MD;  Location: [facility];  Service: Endoscopy;  Laterality: N/A;  . ESOPHAGOGASTRODUODENOSCOPY (EGD) WITH PROPOFOL N/A 11/29/2016   Procedure: ESOPHAGOGASTRODUODENOSCOPY (EGD) WITH PROPOFOL;  Surgeon: [name], MD;  Location: [facility];  Service: Endoscopy;  Laterality: N/A;  . JOINT REPLACEMENT    . knee rotation    . LEG TENDON SURGERY    . OVARIAN CYST REMOVAL    . REPLACEMENT TOTAL KNEE BILATERAL  2006  .  rotator cuff surgery      Current Outpatient Medications on File Prior to Visit  Medication Sig Dispense Refill  . albuterol (PROVENTIL HFA;VENTOLIN HFA) 108 (90 Base) MCG/ACT inhaler Inhale 1-2 puffs into the lungs every 6 (six) hours as needed for wheezing or shortness of breath.    . Alcohol Swabs (ALCOHOL PREP) 70 % PADS     . alendronate (FOSAMAX) 70 MG tablet Take 70 mg by mouth every Monday. Take with a full glass of water on an empty stomach.    [name] amitriptyline (ELAVIL) 75 MG tablet Take 1 tablet (75 mg total) by mouth at bedtime. 90 tablet 0  . AQUALANCE LANCETS 30G MISC     . Ascorbic Acid (VITAMIN C) 500 MG CAPS Take 500 mg by mouth at bedtime.     [name] atorvastatin (LIPITOR) 40 MG tablet Take 40 mg by mouth daily.    . Blood Glucose Calibration (OT ULTRA/FASTTK CNTRL SOLN) SOLN     . Blood  Glucose Monitoring Suppl (ONE TOUCH ULTRA 2) w/Device KIT     . busPIRone (BUSPAR) 10 MG tablet TAKE 1 TABLET(10 MG) BY MOUTH TWICE DAILY 180 tablet 0  . fluticasone (FLONASE) 50 MCG/ACT nasal spray Place 1 spray into both nostrils 2 (two) times daily.   1  . gabapentin (NEURONTIN) 300 MG capsule Take 300 mg by mouth 2 (two) times daily.    [name] glipiZIDE (GLUCOTROL XL) 10 MG 24 hr tablet TK 1 T PO QAM FOR DIABETES    . lamoTRIgine (LAMICTAL) 150 MG tablet TAKE 1 TABLET(150 MG) BY MOUTH DAILY 90 tablet 0  . Lancet Devices (ADJUSTABLE LANCING DEVICE) MISC     . Melatonin 10 MG TABS Take 10 mg by mouth at bedtime.    . metFORMIN (GLUCOPHAGE-XR) 500 MG 24 hr tablet Take 500 mg by mouth daily with breakfast. IN THE MORNING.    . Multiple Vitamin (MULTIVITAMIN WITH MINERALS) TABS tablet Take 1 tablet by mouth daily. Women's Multivitamin.    [name] MYRBETRIQ 50 MG TB24 tablet Take 50 mg by mouth daily.    . ONE TOUCH ULTRA TEST test strip     . Polyethyl Glycol-Propyl Glycol (LUBRICANT EYE DROPS) 0.4-0.3 % SOLN Place 1-2 drops into both eyes 3 (three) times daily as needed (for dry eyes.).    [name] potassium chloride (KLOR-CON) 10 MEQ tablet Take 10 mEq by mouth daily.    . predniSONE (STERAPRED UNI-PAK 21 TAB) 10 MG (21) TBPK tablet Take as directed 21 tablet 0  . ranitidine (ZANTAC) 300 MG tablet Take 300 mg by mouth daily.    [name] spironolactone (ALDACTONE) 100 MG tablet Take 100 mg by mouth daily.   5  . tamsulosin (FLOMAX) 0.4 MG CAPS capsule Take 0.4 mg by mouth at bedtime.  11  . tiZANidine (ZANAFLEX) 4 MG tablet Take 1 tablet (4 mg total) by mouth 3 (three) times daily. 90 tablet 3  . traMADol (ULTRAM) 50 MG tablet Take 1 tablet (50 mg total) by mouth every 6 (six) hours as needed for moderate pain. 30 tablet 0  . triamterene-hydrochlorothiazide (MAXZIDE) 75-50 MG tablet Take 1 tablet by mouth daily.  3  . Acetylcarnitine HCl (ACETYL-L-CARNITINE HCL) POWD by Does not apply route.    . Calcium  Carb-Cholecalciferol (CALCIUM 600+D3 PO) Take 1 tablet by mouth daily.    . cyanocobalamin 100 MCG tablet Take by mouth.    . esomeprazole (NEXIUM) 40 MG capsule TK ONE C PO D FOR ANTIACID    .  furosemide (LASIX) 40 MG tablet Take 1 tablet by mouth daily.    . hydrocortisone 1 % ointment Apply 1 application topically 2 (two) times daily. (Patient not taking: Reported on 01/06/2020) 30 g 0  . Lysine 500 MG CAPS Take by mouth.    . methenamine (HIPREX) 1 g tablet Take 1 g by mouth at bedtime.     . methocarbamol (ROBAXIN) 500 MG tablet Take 1 tablet (500 mg total) by mouth 2 (two) times daily as needed. (Patient not taking: Reported on 01/06/2020) 20 tablet 0   No current facility-administered medications on file prior to visit.    Allergies  Allergen Reactions  . Miconazole Nitrate Hives and Itching  . Sulfonamide Derivatives Hives and Itching    Social History:  reports that she quit smoking about 20 years ago. Her smoking use included cigarettes. She has a 31.00 pack-year smoking history. She has never used smokeless tobacco. She reports current alcohol use. She reports that she does not use drugs.  Family History  Problem Relation Age of Onset  . Suicidality Father   . Depression Father   . Alcohol abuse Father   . Emphysema Father        smoked  . Drug abuse Brother   . Alcohol abuse Brother   . Esophageal cancer Brother   . Depression Maternal Aunt   . Alcohol abuse Brother   . Anxiety disorder Daughter   . Depression Daughter   . Suicidality Daughter   . Alcohol abuse Daughter   . Drug abuse Son   . Alcohol abuse Son   . Emphysema Sister        smoked    The following portions of the patient's history were reviewed and updated as appropriate: allergies, current medications, past family history, past medical history, past social history, past surgical history and problem list.  Review of Systems Pertinent items noted in HPI and remainder of comprehensive ROS otherwise  negative.  Physical Exam:  BP 123/79   Pulse (!) 103  CONSTITUTIONAL: Well-developed, well-nourished female in no acute distress.  HENT:  Normocephalic, atraumatic, External right and left ear normal. Oropharynx is clear and moist EYES: Conjunctivae and EOM are normal. Pupils are equal, round, and reactive to light. No scleral icterus.  NECK: Normal range of motion, supple, no masses.  Normal thyroid.  SKIN: Skin is warm and dry. No rash noted. Not diaphoretic. No erythema. No pallor. MUSCULOSKELETAL: Has cerebral palsy, wheelchair-bound NEUROLOGIC: Alert and oriented to person, place, and time. Normal reflexes, muscle tone coordination.  PSYCHIATRIC: Normal mood and affect. Normal behavior. Normal judgment and thought content. CARDIOVASCULAR: Normal heart rate noted, regular rhythm RESPIRATORY: Clear to auscultation bilaterally. Effort and breath sounds normal, no problems with respiration noted. BREASTS: Symmetric in size. No masses, tenderness, skin changes, nipple drainage, or lymphadenopathy bilaterally. ABDOMEN: Soft, no distention noted.  No tenderness, rebound or guarding.  PELVIC:  Atrophic external genitalia and urethral meatus with leg bag in place; atrophic vaginal mucosa and cervix. Long Pederson speculum used to be able to visualize the cervix.  No abnormal discharge noted. Pap smear obtained. Not able to palpate uterus or adnexa due to habitus.   Assessment and Plan:      1. Needs flu shot - Flu Vaccine QUAD 36+ mos IM  2. Well woman exam with routine gynecological exam - Cytology - PAP( [place]) Will follow up results of pap smear and manage accordingly. Mammogram is up to date. Routine preventative health maintenance measures  emphasized. Please refer to After Visit Summary for other counseling recommendations.      Verita Schneiders, MD, [facility] for [employer], [place]

## 2020-01-06 NOTE — Patient Instructions (Signed)
Return to clinic for any scheduled appointments or for any gynecologic concerns as needed.    Preventive Care 96-63 Years Old, Female Preventive care refers to visits with your health care provider and lifestyle choices that can promote health and wellness. This includes:  A yearly physical exam. This may also be called an annual well check.  Regular dental visits and eye exams.  Immunizations.  Screening for certain conditions.  Healthy lifestyle choices, such as eating a healthy diet, getting regular exercise, not using drugs or products that contain nicotine and tobacco, and limiting alcohol use. What can I expect for my preventive care visit? Physical exam Your health care provider will check your:  Height and weight. This may be used to calculate body mass index (BMI), which tells if you are at a healthy weight.  Heart rate and blood pressure.  Skin for abnormal spots. Counseling Your health care provider may ask you questions about your:  Alcohol, tobacco, and drug use.  Emotional well-being.  Home and relationship well-being.  Sexual activity.  Eating habits.  Work and work Statistician.  Method of birth control.  Menstrual cycle.  Pregnancy history. What immunizations do I need?  Influenza (flu) vaccine  This is recommended every year. Tetanus, diphtheria, and pertussis (Tdap) vaccine  You may need a Td booster every 10 years. Varicella (chickenpox) vaccine  You may need this if you have not been vaccinated. Zoster (shingles) vaccine  You may need this after age 73. Measles, mumps, and rubella (MMR) vaccine  You may need at least one dose of MMR if you were born in 1957 or later. You may also need a second dose. Pneumococcal conjugate (PCV13) vaccine  You may need this if you have certain conditions and were not previously vaccinated. Pneumococcal polysaccharide (PPSV23) vaccine  You may need one or two doses if you smoke cigarettes or if you  have certain conditions. Meningococcal conjugate (MenACWY) vaccine  You may need this if you have certain conditions. Hepatitis A vaccine  You may need this if you have certain conditions or if you travel or work in places where you may be exposed to hepatitis A. Hepatitis B vaccine  You may need this if you have certain conditions or if you travel or work in places where you may be exposed to hepatitis B. Haemophilus influenzae type b (Hib) vaccine  You may need this if you have certain conditions. Human papillomavirus (HPV) vaccine  If recommended by your health care provider, you may need three doses over 6 months. You may receive vaccines as individual doses or as more than one vaccine together in one shot (combination vaccines). Talk with your health care provider about the risks and benefits of combination vaccines. What tests do I need? Blood tests  Lipid and cholesterol levels. These may be checked every 5 years, or more frequently if you are over 67 years old.  Hepatitis C test.  Hepatitis B test. Screening  Lung cancer screening. You may have this screening every year starting at age 75 if you have a 30-pack-year history of smoking and currently smoke or have quit within the past 15 years.  Colorectal cancer screening. All adults should have this screening starting at age 6 and continuing until age 48. Your health care provider may recommend screening at age 40 if you are at increased risk. You will have tests every 1-10 years, depending on your results and the type of screening test.  Diabetes screening. This is done by checking  your blood sugar (glucose) after you have not eaten for a while (fasting). You may have this done every 1-3 years.  Mammogram. This may be done every 1-2 years. Talk with your health care provider about when you should start having regular mammograms. This may depend on whether you have a family history of breast cancer.  BRCA-related cancer  screening. This may be done if you have a family history of breast, ovarian, tubal, or peritoneal cancers.  Pelvic exam and Pap test. This may be done every 3 years starting at age 47. Starting at age 35, this may be done every 5 years if you have a Pap test in combination with an HPV test. Other tests  Sexually transmitted disease (STD) testing.  Bone density scan. This is done to screen for osteoporosis. You may have this scan if you are at high risk for osteoporosis. Follow these instructions at home: Eating and drinking  Eat a diet that includes fresh fruits and vegetables, whole grains, lean protein, and low-fat dairy.  Take vitamin and mineral supplements as recommended by your health care provider.  Do not drink alcohol if: ? Your health care provider tells you not to drink. ? You are pregnant, may be pregnant, or are planning to become pregnant.  If you drink alcohol: ? Limit how much you have to 0-1 drink a day. ? Be aware of how much alcohol is in your drink. In the [place] one drink equals one 12 oz bottle of beer (355 mL), one 5 oz glass of wine (148 mL), or one 1 oz glass of hard liquor (44 mL). Lifestyle  Take daily care of your teeth and gums.  Stay active. Exercise for at least 30 minutes on 5 or more days each week.  Do not use any products that contain nicotine or tobacco, such as cigarettes, e-cigarettes, and chewing tobacco. If you need help quitting, ask your health care provider.  If you are sexually active, practice safe sex. Use a condom or other form of birth control (contraception) in order to prevent pregnancy and STIs (sexually transmitted infections).  If told by your health care provider, take low-dose aspirin daily starting at age 71. What's next?  Visit your health care provider once a year for a well check visit.  Ask your health care provider how often you should have your eyes and teeth checked.  Stay up to date on all vaccines. This  information is not intended to replace advice given to you by your health care provider. Make sure you discuss any questions you have with your health care provider. Document Revised: 06/26/2018 Document Reviewed: 06/26/2018 Elsevier Patient Education  2020 [employer].

## 2020-01-08 LAB — CYTOLOGY - PAP
Comment: NEGATIVE
Diagnosis: NEGATIVE
High risk HPV: NEGATIVE

## 2020-01-11 DIAGNOSIS — H524 Presbyopia: Secondary | ICD-10-CM | POA: Diagnosis not present

## 2020-01-11 DIAGNOSIS — H52223 Regular astigmatism, bilateral: Secondary | ICD-10-CM | POA: Diagnosis not present

## 2020-01-12 DIAGNOSIS — R32 Unspecified urinary incontinence: Secondary | ICD-10-CM | POA: Diagnosis not present

## 2020-01-12 DIAGNOSIS — R351 Nocturia: Secondary | ICD-10-CM | POA: Diagnosis not present

## 2020-01-12 DIAGNOSIS — R339 Retention of urine, unspecified: Secondary | ICD-10-CM | POA: Diagnosis not present

## 2020-01-19 DIAGNOSIS — J439 Emphysema, unspecified: Secondary | ICD-10-CM | POA: Diagnosis not present

## 2020-01-19 DIAGNOSIS — E118 Type 2 diabetes mellitus with unspecified complications: Secondary | ICD-10-CM | POA: Diagnosis not present

## 2020-01-19 DIAGNOSIS — G629 Polyneuropathy, unspecified: Secondary | ICD-10-CM | POA: Diagnosis not present

## 2020-01-19 DIAGNOSIS — R609 Edema, unspecified: Secondary | ICD-10-CM | POA: Diagnosis not present

## 2020-01-19 DIAGNOSIS — K760 Fatty (change of) liver, not elsewhere classified: Secondary | ICD-10-CM | POA: Diagnosis not present

## 2020-01-19 DIAGNOSIS — E782 Mixed hyperlipidemia: Secondary | ICD-10-CM | POA: Diagnosis not present

## 2020-01-19 DIAGNOSIS — R0600 Dyspnea, unspecified: Secondary | ICD-10-CM | POA: Diagnosis not present

## 2020-01-19 DIAGNOSIS — Z6834 Body mass index (BMI) 34.0-34.9, adult: Secondary | ICD-10-CM | POA: Diagnosis not present

## 2020-01-19 DIAGNOSIS — M810 Age-related osteoporosis without current pathological fracture: Secondary | ICD-10-CM | POA: Diagnosis not present

## 2020-01-21 ENCOUNTER — Ambulatory Visit (INDEPENDENT_AMBULATORY_CARE_PROVIDER_SITE_OTHER): Payer: Medicare HMO | Admitting: Physical Medicine and Rehabilitation

## 2020-01-21 ENCOUNTER — Ambulatory Visit: Payer: Self-pay

## 2020-01-21 ENCOUNTER — Encounter: Payer: Self-pay | Admitting: Physical Medicine and Rehabilitation

## 2020-01-21 ENCOUNTER — Other Ambulatory Visit: Payer: Self-pay

## 2020-01-21 VITALS — BP 117/68 | HR 98

## 2020-01-21 DIAGNOSIS — M4802 Spinal stenosis, cervical region: Secondary | ICD-10-CM

## 2020-01-21 DIAGNOSIS — M5412 Radiculopathy, cervical region: Secondary | ICD-10-CM

## 2020-01-21 DIAGNOSIS — R202 Paresthesia of skin: Secondary | ICD-10-CM

## 2020-01-21 MED ORDER — METHYLPREDNISOLONE ACETATE 80 MG/ML IJ SUSP: 40.0000 mg | Freq: Once | INTRAMUSCULAR | Status: DC

## 2020-01-21 NOTE — Progress Notes (Signed)
.  Numeric Pain Rating Scale and Functional Assessment Average Pain 7   In the last MONTH (on 0-10 scale) has pain interfered with the following?  1. General activity like being  able to carry out your everyday physical activities such as walking, climbing stairs, carrying groceries, or moving a chair?  Rating(7)   +Driver, -BT, -Dye Allergies.   

## 2020-01-22 ENCOUNTER — Encounter: Payer: Self-pay | Admitting: Physical Medicine and Rehabilitation

## 2020-01-22 NOTE — Progress Notes (Signed)
[name] - 64 y.o. female MRN [id number]  Date of birth: 1956-03-24  Office Visit Note: Visit Date: 01/21/2020 PCP: Helen Hashimoto., MD Referred by: Helen Hashimoto., MD  Subjective: Chief Complaint  Patient presents with  . Left Shoulder - Pain  . Left Upper Arm - Pain   HPI: [name] is a 64 y.o. female who comes in today For evaluation management possible intervention for her neck pain and left upper arm pain which is chronic with recent exacerbation in February after a fall.  Patient's case is very complicated by multiple medical issues including bipolar type I, cerebral palsy and diabetes.  The last time I saw the patient was in 2018 we did complete cervical epidural at that time with decent relief of her symptoms.  Prior to that she had had an injection before.  MRI of the cervical spine reviewed below.  Since I have seen her in 2018 she has undergone ACDF by Dr. Ashok Pall along with bilateral carpal tunnel release.  Dr. Erlinda Hong has been following her from a orthopedic standpoint which is included her knees and shoulder etc.  After the fall she was evaluated and felt like she was having more radicular complaints than shoulder complaints.  Today she does have some mechanical complaints of the left shoulder with movement.  She endorses 7 out of 10 pain that radiates into the left upper arm without so much down the arm to the hand although that can happen at times.  She denies any right-sided complaints.  She has a history of ongoing headaches but has not noted anything new with the neck pain and headache.  She has had no new changes in her neurologic function which is already somewhat limited.  Review of Systems  Constitutional: Negative for chills, fever, malaise/fatigue and weight loss.  HENT: Negative for hearing loss and sinus pain.   Eyes: Negative for blurred vision, double vision and photophobia.  Respiratory: Negative for cough and shortness of breath.     Cardiovascular: Negative for chest pain, palpitations and leg swelling.  Gastrointestinal: Negative for abdominal pain, nausea and vomiting.  Genitourinary: Negative for flank pain.  Musculoskeletal: Positive for neck pain. Negative for myalgias.  Skin: Negative for itching and rash.  Neurological: Positive for tingling and headaches. Negative for tremors, focal weakness and weakness.  Endo/Heme/Allergies: Negative.   Psychiatric/Behavioral: Negative for depression.  All other systems reviewed and are negative.  Otherwise per HPI.  Assessment & Plan: Visit Diagnoses:  1. Cervical radiculopathy   2. Spinal stenosis of cervical region   3. Paresthesia of skin     Plan: Findings:  Chronic history of neck pain with cervical spondylosis and cervical stenosis with history of left-sided radicular pain off and on which has been treated with intermittent cervical epidural injection.  Last injection was in 2018.  She did have a recent trauma evaluated from orthopedics and felt to be more related to just exacerbation of radicular pain.  Examination of the upper extremities does not show anything from a red flag standpoint.  She is limited from a neurologic standpoint and does use a motorized scooter.  Today we did find on exam that she is really unable to get up onto an exam table or fluoroscopy table.  We are going to refer her to [facility] imaging as they have more of a facility to get her onto the fluoroscopy table.  I do think she needs a cervical epidural injection diagnostically and hopefully therapeutically  and she has had these in the past.  She is not on any anticoagulation.  Alternatively would recommend follow-up with Dr. Ashok Pall.  She also should follow-up with Dr. Eduard Roux for continued treatment of her left shoulder which I think there is something mechanical there.    Meds & Orders:  Meds ordered this encounter  Medications  . methylPREDNISolone acetate (DEPO-MEDROL) injection  40 mg    Orders Placed This Encounter  Procedures  . Epidural Steroid injection    Follow-up: Return for C7-T1 interlaminar injection at [facility] imaging..   Procedures: No procedures performed  No notes on file   Clinical History: MRI CERVICAL SPINE WITHOUT CONTRAST  TECHNIQUE: Multiplanar, multisequence MR imaging of the cervical spine was performed. No intravenous contrast was administered.  COMPARISON:  Cervical spine MRI 04/29/2017 and earlier.  FINDINGS: Alignment: Improved cervical lordosis since 2017. No spondylolisthesis.  Vertebrae: Mild hardware susceptibility artifact related to C5-C6 ACDF. No marrow edema or evidence of acute osseous abnormality. Visualized bone marrow signal is within normal limits.  Cord: No definite cervical or upper thoracic spinal cord signal abnormality.  Posterior Fossa, vertebral arteries, paraspinal tissues: Cervicomedullary junction is within normal limits. Negative visible posterior fossa. Preserved major vascular flow voids in the neck, the left vertebral artery is dominant and the right is diminutive.  Disc levels:  C2-C3:  Mild facet hypertrophy.  No stenosis.  C3-C4: Circumferential disc bulge. Broad-based posterior disc bulge or protrusion with mild spinal stenosis and mild spinal cord mass effect. This level has progressed since 2017. Foraminal disc and mild facet hypertrophy results in moderate left and mild right C4 foraminal stenosis which appears more stable.  C4-C5: Left eccentric circumferential disc bulge with endplate spurring. Mild spinal stenosis and spinal cord mass effect has increased since 2017. Mild to moderate left C5 foraminal stenosis is stable to mildly increased.  C5-C6: ACDF. Residual left foraminal endplate spurring and stenosis appears stable.  C6-C7: Left foraminal endplate spurring with minor disc bulging. Moderate left and mild right facet and ligament flavum hypertrophy. No  spinal stenosis. Moderate left C7 foraminal stenosis is stable.  C7-T1: Mild left and moderate right facet hypertrophy. Borderline to mild right C8 foraminal stenosis is stable.  No upper thoracic stenosis.  IMPRESSION: 1. Chronic C5-C6 ACDF. 2. Adjacent segment disease at C4-C5 with mild spinal and up to moderate left C5 foraminal stenosis appears increased since 2017. Adjacent segment disease at C6-C7 with moderate left C7 foraminal stenosis in part due to facet hypertrophy appears stable. 3. Chronic C3-C4 disc degeneration with increased mild spinal stenosis and mild cord mass effect since 2017. No associated cord signal abnormality. Moderate left C4 foraminal stenosis at this level appears more stable.   Electronically Signed   By: Genevie Ann M.D.   On: 09/24/2018 16:08   She reports that she quit smoking about 20 years ago. Her smoking use included cigarettes. She has a 31.00 pack-year smoking history. She has never used smokeless tobacco. No results for input(s): HGBA1C, LABURIC in the last 8760 hours.  Objective:  VS:  HT:    WT:   BMI:     BP:117/68  HR:98bpm  TEMP: ( )  RESP:  Physical Exam Vitals and nursing note reviewed.  Constitutional:      General: She is not in acute distress.    Appearance: Normal appearance. She is well-developed. She is obese. She is not ill-appearing.  HENT:     Head: Normocephalic and atraumatic.  Eyes:  Conjunctiva/sclera: Conjunctivae normal.     Pupils: Pupils are equal, round, and reactive to light.  Cardiovascular:     Rate and Rhythm: Normal rate.     Pulses: Normal pulses.  Pulmonary:     Effort: Pulmonary effort is normal.  Musculoskeletal:     Cervical back: Tenderness present. No rigidity.     Right lower leg: No edema.     Left lower leg: No edema.     Comments: Patient sits on motorized scooter.  She has difficulty going from sitting to standing and cannot get herself to exam table without significant assist.   She is somewhat dependent significantly on transfer.  Cervical spine is forward flexed.  She has some impingement of the left shoulder.  She has good strength in both hands.   Lymphadenopathy:     Cervical: No cervical adenopathy.  Skin:    General: Skin is warm and dry.     Findings: No erythema or rash.  Neurological:     General: No focal deficit present.     Mental Status: She is alert and oriented to person, place, and time.     Sensory: No sensory deficit.     Motor: Weakness present. No abnormal muscle tone.     Coordination: Coordination normal.     Gait: Gait abnormal.  Psychiatric:        Mood and Affect: Mood normal.        Behavior: Behavior normal.     Ortho Exam Imaging: No results found.  Past Medical/Family/Surgical/Social History: Medications & Allergies reviewed per EMR, new medications updated. Patient Active Problem List   Diagnosis Date Noted  . Neck pain 11/21/2018  . Low back pain 08/19/2018  . Weakness of both lower extremities 08/19/2018  . Pain in finger of left hand 05/28/2018  . Pain in right hand 05/28/2018  . Pain in left elbow 04/02/2018  . Chronic right shoulder pain 07/16/2017  . Rotator cuff syndrome, right 05/13/2017  . Cervical radiculopathy 01/14/2017  . Colon cancer screening   . Encounter for screening for gastric cancer    . Osteoarthritis of spine with radiculopathy, cervical region 05/25/2016  . Postmenopausal bleeding 08/04/2015  . Acute respiratory failure ([place]) 07/19/2013  . Nocturnal hypoxemia 07/07/2013  . UTI (lower urinary tract infection) 07/07/2013  . Hypokalemia 07/07/2013  . Abdominal pain, acute, right lower quadrant 07/07/2013  . Benign paroxysmal positional vertigo 02/24/2013  . Edema of both legs 02/24/2013  . Dysplasia of cervix   . Melanoma in situ ([place])   . Bipolar 1 disorder ([place]) 02/08/2012  . IBS 12/04/2007  . RASH AND OTHER NONSPECIFIC SKIN ERUPTION 12/04/2007  . EMPHYSEMA by CT only 09/05/2007  . NECK  MASS 09/05/2007  . 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[place]) 08/13/2007  . Cerebral palsy ([place]) 08/13/2007  . OTITIS EXTERNA, ACUTE 08/13/2007  . IRRITABLE BOWEL SYNDROME, HX OF 08/13/2007  . HYPERLIPIDEMIA 05/10/2007  . DEPRESSION 05/10/2007  . ALLERGIC RHINITIS 05/10/2007   Past Medical History:  Diagnosis Date  . Acromegaly ([place])   . Anxiety   . Arthritis   . Bacterial infection   . Benign paroxysmal positional vertigo 02/24/2013  . Bipolar disorder ([place])   . Bladder infection   . Cancer ([place])    skin cancer  . Cerebral palsy ([place])   . Colitis   . COPD (chronic obstructive pulmonary disease) ([facility])    mild emphysema  . Depression    bi polar  . Diabetes mellitus, type II ([place])   .  Dysplasia of cervix   . GERD (gastroesophageal reflux disease)   . Headache(784.0)   . High cholesterol   . History of chicken pox   . History of measles   . History of mumps   . IBS (irritable bowel syndrome)   . Kidney infection   . Melanoma in situ ([place])   . Neck mass   . Neuromuscular disorder ([place])   . Ovarian cyst   . Poor circulation   . Stenosis, cervical spine 05/16  . Trichomonas   . UTI (urinary tract infection)    current  . Yeast infection    Family History  Problem Relation Age of Onset  . Suicidality Father   . Depression Father   . Alcohol abuse Father   . Emphysema Father        smoked  . Drug abuse Brother   . Alcohol abuse Brother   . Esophageal cancer Brother   . Depression Maternal Aunt   . Alcohol abuse Brother   . Anxiety disorder Daughter   . Depression Daughter   . Suicidality Daughter   . Alcohol abuse Daughter   . Drug abuse Son   . Alcohol abuse Son   . Emphysema Sister        smoked   Past Surgical History:  Procedure Laterality Date  . ANTERIOR CERVICAL DECOMP/DISCECTOMY FUSION N/A 05/25/2016   Procedure: Cervical five-six Anterior cervical decompression/diskectomy/fusion;  Surgeon: Ashok Pall, MD;  Location: [place] [facility];  Service: Neurosurgery;  Laterality:  N/A;  . APPENDECTOMY    . CARPAL TUNNEL RELEASE    . CARPAL TUNNEL RELEASE Right 08/02/2017   Procedure: CARPAL TUNNEL RELEASE RIGHT;  Surgeon: Ashok Pall, MD;  Location: [place];  Service: Neurosurgery;  Laterality: Right;  CARPAL TUNNEL RELEASE RIGHT  . CARPAL TUNNEL RELEASE Left 11/29/2017   Procedure: CARPAL TUNNEL RELEASE LEFT;  Surgeon: Ashok Pall, MD;  Location: [place];  Service: Neurosurgery;  Laterality: Left;  CARPAL TUNNEL RELEASE LEFT  . COLONOSCOPY WITH PROPOFOL N/A 11/29/2016   Procedure: COLONOSCOPY WITH PROPOFOL;  Surgeon: [name], MD;  Location: [facility];  Service: Endoscopy;  Laterality: N/A;  . ESOPHAGOGASTRODUODENOSCOPY (EGD) WITH PROPOFOL N/A 11/29/2016   Procedure: ESOPHAGOGASTRODUODENOSCOPY (EGD) WITH PROPOFOL;  Surgeon: [name], MD;  Location: [facility];  Service: Endoscopy;  Laterality: N/A;  . JOINT REPLACEMENT    . knee rotation    . LEG TENDON SURGERY    . OVARIAN CYST REMOVAL    . REPLACEMENT TOTAL KNEE BILATERAL  2006  . rotator cuff surgery     Social History   Occupational History  . Occupation: Disabled    Employer: [employer]  Tobacco Use  . Smoking status: Former Smoker    Packs/day: 1.00    Years: 31.00    Pack years: 31.00    Types: Cigarettes    Quit date: 11/18/1999    Years since quitting: 20.1  . Smokeless tobacco: Never Used  Substance and Sexual Activity  . Alcohol use: Yes    Alcohol/week: 0.0 standard drinks    Comment: occ.  . Drug use: No  . Sexual activity: Never

## 2020-01-28 ENCOUNTER — Other Ambulatory Visit: Payer: Self-pay | Admitting: Physical Medicine and Rehabilitation

## 2020-01-28 ENCOUNTER — Other Ambulatory Visit: Payer: Self-pay | Admitting: Orthopedic Surgery

## 2020-01-28 DIAGNOSIS — M5412 Radiculopathy, cervical region: Secondary | ICD-10-CM

## 2020-01-28 DIAGNOSIS — M4802 Spinal stenosis, cervical region: Secondary | ICD-10-CM

## 2020-02-03 DIAGNOSIS — R32 Unspecified urinary incontinence: Secondary | ICD-10-CM | POA: Diagnosis not present

## 2020-02-03 DIAGNOSIS — R339 Retention of urine, unspecified: Secondary | ICD-10-CM | POA: Diagnosis not present

## 2020-02-03 DIAGNOSIS — R351 Nocturia: Secondary | ICD-10-CM | POA: Diagnosis not present

## 2020-02-09 DIAGNOSIS — H524 Presbyopia: Secondary | ICD-10-CM | POA: Diagnosis not present

## 2020-02-09 DIAGNOSIS — H5213 Myopia, bilateral: Secondary | ICD-10-CM | POA: Diagnosis not present

## 2020-02-09 DIAGNOSIS — H52203 Unspecified astigmatism, bilateral: Secondary | ICD-10-CM | POA: Diagnosis not present

## 2020-02-11 ENCOUNTER — Telehealth: Payer: Self-pay | Admitting: Orthopaedic Surgery

## 2020-02-11 NOTE — Telephone Encounter (Signed)
We were unable to complete the patient's cervical ESI. She was unable to stand to get on the table. We did send an order to [facility] to see if this could be done there. She was scheduled for tomorrow, but [facility] called today stating that they cannot perform this procedure there because they do not have a hoyer lift and the patient cannot be positioned on the table without one. Please advise.

## 2020-02-11 NOTE — Telephone Encounter (Signed)
See message below °

## 2020-02-12 ENCOUNTER — Inpatient Hospital Stay: Admission: RE | Admit: 2020-02-12 | Payer: Medicare HMO | Source: Ambulatory Visit

## 2020-02-12 NOTE — Telephone Encounter (Signed)
Where can we do this?

## 2020-02-12 NOTE — Telephone Encounter (Signed)
See message.

## 2020-02-12 NOTE — Telephone Encounter (Signed)
Can you see if there is another place in town that maybe has a [employer] fast streamer that can do this injection?

## 2020-02-15 ENCOUNTER — Telehealth (HOSPITAL_COMMUNITY): Payer: Self-pay

## 2020-02-15 NOTE — Telephone Encounter (Signed)
Received rx refill request for lamotrigine tablets. Last visit 10/15/19. No upcoming appointments. Attempted to contact pt to set up appointment, left VM. [facility] pharmacy on [address], [address]. Please advise.

## 2020-02-15 NOTE — Telephone Encounter (Signed)
Needs appointment

## 2020-02-22 NOTE — Telephone Encounter (Signed)
I am checking around like crazy to see where I can send this pt. Once I get information I will schedule

## 2020-02-24 NOTE — Telephone Encounter (Signed)
This is going to be sent to IR at [facility] where they have an [employer] fast streamer

## 2020-02-25 ENCOUNTER — Telehealth (HOSPITAL_COMMUNITY): Payer: Self-pay

## 2020-02-25 NOTE — Telephone Encounter (Signed)
error 

## 2020-02-25 NOTE — Telephone Encounter (Signed)
Does she has enough until her appointment?

## 2020-02-25 NOTE — Telephone Encounter (Signed)
Received fax from [facility] requesting refill on patient's Buspirone 10mg . Spoke with patient and verified that [name] is her pharmacy now due to insurance. She has a scheduled appointment for 03/02/20. Thank you.

## 2020-02-29 DIAGNOSIS — R351 Nocturia: Secondary | ICD-10-CM | POA: Diagnosis not present

## 2020-02-29 DIAGNOSIS — R32 Unspecified urinary incontinence: Secondary | ICD-10-CM | POA: Diagnosis not present

## 2020-02-29 DIAGNOSIS — R339 Retention of urine, unspecified: Secondary | ICD-10-CM | POA: Diagnosis not present

## 2020-03-02 ENCOUNTER — Other Ambulatory Visit: Payer: Self-pay

## 2020-03-02 ENCOUNTER — Telehealth (INDEPENDENT_AMBULATORY_CARE_PROVIDER_SITE_OTHER): Payer: Medicare HMO | Admitting: Psychiatry

## 2020-03-02 DIAGNOSIS — F319 Bipolar disorder, unspecified: Secondary | ICD-10-CM

## 2020-03-02 DIAGNOSIS — F419 Anxiety disorder, unspecified: Secondary | ICD-10-CM

## 2020-03-02 MED ORDER — AMITRIPTYLINE HCL 75 MG PO TABS: 75.0000 mg | ORAL_TABLET | Freq: Every day | ORAL | 0 refills | Status: DC

## 2020-03-02 MED ORDER — LAMOTRIGINE 150 MG PO TABS: ORAL_TABLET | ORAL | 0 refills | Status: DC

## 2020-03-02 MED ORDER — BUSPIRONE HCL 10 MG PO TABS: ORAL_TABLET | ORAL | 0 refills | Status: DC
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
## 2020-03-03 ENCOUNTER — Telehealth (HOSPITAL_COMMUNITY): Payer: Self-pay | Admitting: *Deleted

## 2020-03-03 ENCOUNTER — Other Ambulatory Visit (HOSPITAL_COMMUNITY): Payer: Self-pay | Admitting: *Deleted

## 2020-03-03 DIAGNOSIS — C44311 Basal cell carcinoma of skin of nose: Secondary | ICD-10-CM | POA: Diagnosis not present

## 2020-03-03 MED ORDER — BUSPIRONE HCL 10 MG PO TABS: 10.0000 mg | ORAL_TABLET | Freq: Two times a day (BID) | ORAL | 0 refills | Status: AC

## 2020-03-03 NOTE — Telephone Encounter (Signed)
[facility] mail order pharmacy called stating that it is going to take about 2 weeks to get Buspar 10mg  to pt and asked, for pt, a bridge script for the 2 weeks. Rx sent to [facility]. In [facility].

## 2020-03-03 NOTE — Telephone Encounter (Signed)
ok 

## 2020-03-04 ENCOUNTER — Other Ambulatory Visit: Payer: Self-pay

## 2020-03-04 ENCOUNTER — Ambulatory Visit (HOSPITAL_COMMUNITY)
Admission: RE | Admit: 2020-03-04 | Discharge: 2020-03-04 | Disposition: A | Discharge status: Home / Self Care | Payer: Medicare HMO | Source: Ambulatory Visit | Attending: Physical Medicine and Rehabilitation | Admitting: Physical Medicine and Rehabilitation

## 2020-03-04 DIAGNOSIS — M5412 Radiculopathy, cervical region: Secondary | ICD-10-CM | POA: Insufficient documentation

## 2020-03-04 DIAGNOSIS — M4802 Spinal stenosis, cervical region: Secondary | ICD-10-CM | POA: Diagnosis not present

## 2020-03-04 DIAGNOSIS — M50123 Cervical disc disorder at C6-C7 level with radiculopathy: Secondary | ICD-10-CM | POA: Diagnosis not present

## 2020-03-04 HISTORY — PX: IR INJECT/THERA/INC NEEDLE/CATH/PLC EPI/CERV/THOR W/IMG: IMG6128

## 2020-03-04 MED ORDER — METHYLPREDNISOLONE ACETATE 80 MG/ML IJ SUSP
INTRAMUSCULAR | Status: AC
  Filled 2020-03-04: qty 1

## 2020-03-04 MED ORDER — METHYLPREDNISOLONE ACETATE 40 MG/ML IJ SUSP
INTRAMUSCULAR | Status: AC
  Filled 2020-03-04: qty 1

## 2020-03-04 MED ORDER — LIDOCAINE HCL (PF) 1 % IJ SOLN
INTRAMUSCULAR | Status: AC
  Filled 2020-03-04: qty 10

## 2020-03-04 MED ORDER — TRIAMCINOLONE ACETONIDE 40 MG/ML IJ SUSP (RADIOLOGY)
60.0000 mg | Freq: Once | INTRAMUSCULAR | Status: AC
  Administered 2020-03-04: 60 mg via EPIDURAL
  Filled 2020-03-04: qty 2

## 2020-03-04 MED ORDER — IOHEXOL 180 MG/ML  SOLN
20.0000 mL | Freq: Once | INTRAMUSCULAR | Status: AC | PRN
  Administered 2020-03-04: 10:00:00 5 mL

## 2020-03-04 MED ORDER — SODIUM CHLORIDE (PF) 0.9 % IJ SOLN
INTRAMUSCULAR | Status: AC
  Filled 2020-03-04: qty 10

## 2020-03-04 MED ORDER — LIDOCAINE HCL (PF) 1 % IJ SOLN
INTRAMUSCULAR | Status: DC | PRN
  Administered 2020-03-04: 5 mL

## 2020-04-01 ENCOUNTER — Telehealth: Payer: Self-pay | Admitting: Orthopaedic Surgery

## 2020-04-01 DIAGNOSIS — R351 Nocturia: Secondary | ICD-10-CM | POA: Diagnosis not present

## 2020-04-01 DIAGNOSIS — R32 Unspecified urinary incontinence: Secondary | ICD-10-CM | POA: Diagnosis not present

## 2020-04-01 DIAGNOSIS — R339 Retention of urine, unspecified: Secondary | ICD-10-CM | POA: Diagnosis not present

## 2020-04-01 NOTE — Telephone Encounter (Signed)
Made in error

## 2020-04-04 ENCOUNTER — Telehealth: Payer: Self-pay | Admitting: Orthopaedic Surgery

## 2020-04-04 NOTE — Telephone Encounter (Signed)
Received call from [facility] with [employer] called needing the CMN form completed and faxed back. Caren Griffins said #[phone] on the form was incomplete. The number to contact Caren Griffins is [phone]

## 2020-04-07 NOTE — Progress Notes (Signed)
NEUROLOGY FOLLOW UP OFFICE NOTE  [name] [id number]  HISTORY OF PRESENT ILLNESS: [name] is a 64 year old right-handed female with cerebral palsy, acromegaly,cervical spinal stenosis s/p ACDF, s/p bilateral carpal tunnel surgery,Bipolar 1 disorder, and COPD who follows up for headache.  UPDATE: Increase headache frequency. Intensity:  severe Duration:  Very brief (seconds), not requiring acute management Frequency:  occurs several times a day every other day. Frequency of abortive medication: none Current NSAIDS:none Current analgesics: Tramadol (rarely uses) Current triptans:none Current ergotamine:none Current anti-emetic:none Current muscle relaxants:none Current anti-anxiolytic: BuSpar Current sleep aide: melatonin Current Antihypertensive medications: Spironolactone, Maxzide Current Antidepressant medications: Amitriptyline 31m Current Anticonvulsant medications: Lamotrigine 1561mdaily, gabapentin 30054mwice daily Current anti-CGRP:none Current Vitamins/Herbal/Supplements: Melatonin, MVI Current Antihistamines/Decongestants: Flonase Other therapy:none  Caffeine:Rarely drinks coffee. Drinks a Coke about twice a daily. Drinks tea. Diet:Soda. No water. Depression:mild; Anxiety:yes Other pain:Neck pain Sleep hygiene:  She sleeps okay but has daytime drowsiness.  She may fall asleep on her scooter.    HISTORY: Onset:Several years Location:Retro-orbital, right worse than left Quality:Throbbing/burning. Initial intensity:8/10. Shedenies new headache, thunderclap headache Sometimes has burning on crown. She does have chronic neck pain s/p surgery. Aura:none Premonitory Phase:none Postdrome:none Associated symptoms:None. Shedenies associated nausea, vomiting, photophobia, phonophobia, visual disturbance orunilateral numbness or weakness. Initial duration:Off and on throughout the  day Initial frequency:1 or 2 days a week. Triggers:none Exacerbating factors:none Relieving factors:no Activity:Able to function.  MRI/MRA of brain from 03/10/13 personally reviewed and demonstrated tiny nonspecific pervintricular and subcortical white matter changes. Repeat MRI of brain from 09/08/14 personally reviewed and stable. For cervical and occipital pain, she had an MRI of the cervical spine on 02/11/15, which showed multilevel left-sided foraminal stenosis, including at C6-7, as well as bilaterally at C5-6, and with moderate central canal stenosis at C5-6. For cervical radiculitisshe is treated by her orthopedist/pain specialist.    Past NSAIDS: naproxen Past analgesics: Tylenol, Excedrin Migraine Past abortive triptans:none Past abortive ergotamine:none Past muscle relaxants: Flexeril; tizanidine 4mg29mree times daily (drowsiness) Past anti-emetic: Promethazine, Zofran ODT 8mg 71mt antihypertensive medications:none Past antidepressant medications:Zoloft Past anticonvulsant medications:none Past anti-CGRP:none Past vitamins/Herbal/Supplements:none Past antihistamines/decongestants: Benadryl Other past therapies:none   Family history of headache:sister  PAST MEDICAL HISTORY: Past Medical History:  Diagnosis Date  . Acromegaly (HCC) [place] Anxiety   . Arthritis   . Bacterial infection   . Benign paroxysmal positional vertigo 02/24/2013  . Bipolar disorder (HCC) [place] Bladder infection   . [place]  . Cerebral palsy (HCC) [place] Colitis   . COPD (chronic obstructive pulmonary disease) (HCC)    mild emphysema  . Depression    bi polar  . Diabetes mellitus, type II (HCC) [place] Dysplasia of cervix   . GERD (gastroesophageal reflux disease)   . Headache(784.0)   . High cholesterol   . History of chicken pox   . History of measles   . History of mumps   . IBS (irritable bowel syndrome)   . Kidney infection   .  Melanoma in situ (HCC) [place] Neck mass   . Neuromuscular disorder (HCC) [place] Ovarian cyst   . Poor circulation   . Stenosis, cervical spine 05/16  . Trichomonas   . UTI (urinary tract infection)    current  . Yeast infection     MEDICATIONS: Current Outpatient Medications on File Prior to Visit  Medication Sig Dispense Refill  . Acetylcarnitine HCl (  ACETYL-L-CARNITINE HCL) POWD by Does not apply route.    [name] albuterol (PROVENTIL HFA;VENTOLIN HFA) 108 (90 Base) MCG/ACT inhaler Inhale 1-2 puffs into the lungs every 6 (six) hours as needed for wheezing or shortness of breath.    . Alcohol Swabs (ALCOHOL PREP) 70 % PADS     . alendronate (FOSAMAX) 70 MG tablet Take 70 mg by mouth every Monday. Take with a full glass of water on an empty stomach.    [name] amitriptyline (ELAVIL) 75 MG tablet Take 1 tablet (75 mg total) by mouth at bedtime. 90 tablet 0  . AQUALANCE LANCETS 30G MISC     . Ascorbic Acid (VITAMIN C) 500 MG CAPS Take 500 mg by mouth at bedtime.     [name] atorvastatin (LIPITOR) 40 MG tablet Take 40 mg by mouth daily.    . Blood Glucose Calibration (OT ULTRA/FASTTK CNTRL SOLN) SOLN     . Blood Glucose Monitoring Suppl (ONE TOUCH ULTRA 2) w/Device KIT     . busPIRone (BUSPAR) 10 MG tablet TAKE 1 TABLET(10 MG) BY MOUTH TWICE DAILY 180 tablet 0  . Calcium Carb-Cholecalciferol (CALCIUM 600+D3 PO) Take 1 tablet by mouth daily.    . cyanocobalamin 100 MCG tablet Take by mouth.    . esomeprazole (NEXIUM) 40 MG capsule TK ONE C PO D FOR ANTIACID    . fluticasone (FLONASE) 50 MCG/ACT nasal spray Place 1 spray into both nostrils 2 (two) times daily.   1  . furosemide (LASIX) 40 MG tablet Take 1 tablet by mouth daily.    [name] gabapentin (NEURONTIN) 300 MG capsule Take 300 mg by mouth 2 (two) times daily.    [name] glipiZIDE (GLUCOTROL XL) 10 MG 24 hr tablet TK 1 T PO QAM FOR DIABETES    . hydrocortisone 1 % ointment Apply 1 application topically 2 (two) times daily. 30 g 0  . lamoTRIgine (LAMICTAL) 150 MG  tablet TAKE 1 TABLET(150 MG) BY MOUTH DAILY 90 tablet 0  . Lancet Devices (ADJUSTABLE LANCING DEVICE) MISC     . Lysine 500 MG CAPS Take by mouth.    . Melatonin 10 MG TABS Take 10 mg by mouth at bedtime.    . metFORMIN (GLUCOPHAGE-XR) 500 MG 24 hr tablet Take 500 mg by mouth daily with breakfast. IN THE MORNING.    . methenamine (HIPREX) 1 g tablet Take 1 g by mouth at bedtime.     . methocarbamol (ROBAXIN) 500 MG tablet Take 1 tablet (500 mg total) by mouth 2 (two) times daily as needed. 20 tablet 0  . Multiple Vitamin (MULTIVITAMIN WITH MINERALS) TABS tablet Take 1 tablet by mouth daily. Women's Multivitamin.    [name] MYRBETRIQ 50 MG TB24 tablet Take 50 mg by mouth daily.    . ONE TOUCH ULTRA TEST test strip     . Polyethyl Glycol-Propyl Glycol (LUBRICANT EYE DROPS) 0.4-0.3 % SOLN Place 1-2 drops into both eyes 3 (three) times daily as needed (for dry eyes.).    [name] potassium chloride (KLOR-CON) 10 MEQ tablet Take 10 mEq by mouth daily.    . ranitidine (ZANTAC) 300 MG tablet Take 300 mg by mouth daily.    [name] spironolactone (ALDACTONE) 100 MG tablet Take 100 mg by mouth daily.   5  . tamsulosin (FLOMAX) 0.4 MG CAPS capsule Take 0.4 mg by mouth at bedtime.  11  . traMADol (ULTRAM) 50 MG tablet Take 1 tablet (50 mg total) by mouth every 6 (six) hours as needed for moderate pain.  30 tablet 0  . triamterene-hydrochlorothiazide (MAXZIDE) 75-50 MG tablet Take 1 tablet by mouth daily.  3   Current Facility-Administered Medications on File Prior to Visit  Medication Dose Route Frequency Provider Last Rate Last Admin  . methylPREDNISolone acetate (DEPO-MEDROL) injection 40 mg  40 mg Other Once Magnus Sinning, MD        ALLERGIES: Allergies  Allergen Reactions  . Miconazole Nitrate Hives and Itching  . Sulfonamide Derivatives Hives and Itching    FAMILY HISTORY: Family History  Problem Relation Age of Onset  . Suicidality Father   . Depression Father   . Alcohol abuse Father   . Emphysema  Father        smoked  . Drug abuse Brother   . Alcohol abuse Brother   . Esophageal cancer Brother   . Depression Maternal Aunt   . Alcohol abuse Brother   . Anxiety disorder Daughter   . Depression Daughter   . Suicidality Daughter   . Alcohol abuse Daughter   . Drug abuse Son   . Alcohol abuse Son   . Emphysema Sister        smoked   SOCIAL HISTORY: Social History   Socioeconomic History  . Marital status: Divorced    Spouse name: Not on file  . Number of children: Not on file  . Years of education: graduate  . Highest education level: Not on file  Occupational History  . Occupation: Disabled    Employer: [employer]  Tobacco Use  . Smoking status: Former Smoker    Packs/day: 1.00    Years: 31.00    Pack years: 31.00    Types: Cigarettes    Quit date: 11/18/1999    Years since quitting: 20.4  . Smokeless tobacco: Never Used  Vaping Use  . Vaping Use: Never used  Substance and Sexual Activity  . Alcohol use: Yes    Alcohol/week: 0.0 standard drinks    Comment: occ.  . Drug use: No  . Sexual activity: Never  Other Topics Concern  . Not on file  Social History Narrative   Right handed, Disabled. Live boyfriend , [name], Caffeine rare.  Disabled.   One level home. Bachelors degree   Social Determinants of Radio broadcast assistant Strain:   . Difficulty of Paying Living Expenses:   Food Insecurity:   . Worried About Charity fundraiser in the Last Year:   . Arboriculturist in the Last Year:   Transportation Needs:   . Film/video editor (Medical):   [name] Lack of Transportation (Non-Medical):   Physical Activity:   . Days of Exercise per Week:   . Minutes of Exercise per Session:   Stress:   . Feeling of Stress :   Social Connections:   . Frequency of Communication with Friends and Family:   . Frequency of Social Gatherings with Friends and Family:   . Attends Religious Services:   . Active Member of Clubs or Organizations:   . Attends  Archivist Meetings:   [name] Marital Status:   Intimate Partner Violence:   . Fear of Current or Ex-Partner:   . Emotionally Abused:   [name] Physically Abused:   . Sexually Abused:    PHYSICAL EXAM: Blood pressure 118/72, pulse 100, height _0  (1.702 m), weight 220 lb (99.8 kg), SpO2 95 %. General: No acute distress.  Patient appears well-groomed.   Head:  Normocephalic/atraumatic Eyes:  Fundi examined but not visualized Neck:  supple, no paraspinal tenderness, full range of motion Heart:  Regular rate and rhythm Lungs:  Clear to auscultation bilaterally Back: No paraspinal tenderness Neurological Exam: alert and oriented to person, place, and time. Attention span and concentration intact, recent and remote memory intact, fund of knowledge intact.  Speech fluent and not dysarthric, language intact.  CN II-XII intact. Bulk and tone normal, muscle strength 5/5 upper extremities, 2/5 bilateral hip flexion, otherwise 3/5 lower extremities.  Sensation to light touch intact.  Deep tendon reflexes 2+ throughout, toes downgoing.  Finger to nose testing intact.  Nonambulatory.    IMPRESSION: Headaches, unspecified.  Increased frequency lately.  PLAN: 1.  Increase amitriptyline to 19m at bedtime.  Advised to continue limiting use of tramadol due to potential of serotonin syndrome. 2.  Limit use of pain relievers to no more than 2 days out of week to prevent risk of rebound or medication-overuse headache. 3.  Consider sleep study regarding daytime drowsiness. 4.  Follow up in 6 months.    AMetta Clines DO  CC: SJenean Lindau MD

## 2020-04-08 ENCOUNTER — Telehealth: Payer: Self-pay | Admitting: Orthopaedic Surgery

## 2020-04-08 NOTE — Telephone Encounter (Signed)
This was faxed yesterday apparently this wasn't completed all the way. They will be faxing it today .

## 2020-04-08 NOTE — Telephone Encounter (Signed)
Pending fax.

## 2020-04-08 NOTE — Telephone Encounter (Signed)
Caren Griffins from [employer] called.   She faxed over paperwork on the patient's behalf but it wasn't completed all the way. She was telling us to keep an eye out as she is resending it this morning .   Call back: [phone]

## 2020-04-11 ENCOUNTER — Ambulatory Visit (INDEPENDENT_AMBULATORY_CARE_PROVIDER_SITE_OTHER): Payer: Medicare HMO | Admitting: Neurology

## 2020-04-11 ENCOUNTER — Encounter: Payer: Self-pay | Admitting: Neurology

## 2020-04-11 ENCOUNTER — Other Ambulatory Visit: Payer: Self-pay

## 2020-04-11 VITALS — BP 118/72 | HR 100 | Ht 67.0 in | Wt 220.0 lb

## 2020-04-11 DIAGNOSIS — R519 Headache, unspecified: Secondary | ICD-10-CM

## 2020-04-11 MED ORDER — AMITRIPTYLINE HCL 100 MG PO TABS: 100.0000 mg | ORAL_TABLET | Freq: Every day | ORAL | 5 refills | Status: DC

## 2020-04-11 NOTE — Patient Instructions (Signed)
1.  Increased amitriptyline to 100mg  at bedtime.  Please try not to use tramadol if possible due to potential drug interactions (such as serotonin syndrome) 2.  Limit use of pain relievers to no more than 2 days out of week to prevent risk of rebound or medication-overuse headache. 3.  Follow up in 5 months.

## 2020-04-12 DIAGNOSIS — C44311 Basal cell carcinoma of skin of nose: Secondary | ICD-10-CM | POA: Diagnosis not present

## 2020-04-12 DIAGNOSIS — Z85828 Personal history of other malignant neoplasm of skin: Secondary | ICD-10-CM | POA: Diagnosis not present

## 2020-04-13 ENCOUNTER — Telehealth: Payer: Self-pay | Admitting: Dermatology

## 2020-04-13 NOTE — Telephone Encounter (Signed)
Phone call to patient to inform her that she contacted the wrong office regarding pain medication.  Patient has never been seen in our office and she doesn't have any upcoming appointments with any of our Providers.  Patient aware and states she'll look at her information again and call the correct office.

## 2020-04-13 NOTE — Telephone Encounter (Signed)
Said drug for pain called in yesterday (she does not know the name) was called in to [facility]. She said it needs to be at [facility] in [place] and pharmacy told her they couldn't transfer it.

## 2020-04-19 DIAGNOSIS — R6889 Other general symptoms and signs: Secondary | ICD-10-CM | POA: Diagnosis not present

## 2020-04-24 ENCOUNTER — Encounter (HOSPITAL_COMMUNITY): Payer: Self-pay | Admitting: Emergency Medicine

## 2020-04-24 ENCOUNTER — Other Ambulatory Visit: Payer: Self-pay

## 2020-04-24 ENCOUNTER — Ambulatory Visit (HOSPITAL_COMMUNITY)
Admission: EM | Admit: 2020-04-24 | Discharge: 2020-04-24 | Disposition: A | Discharge status: Home / Self Care | Payer: Medicare HMO | Attending: Urgent Care | Admitting: Urgent Care

## 2020-04-24 DIAGNOSIS — R059 Cough, unspecified: Secondary | ICD-10-CM

## 2020-04-24 DIAGNOSIS — R05 Cough: Secondary | ICD-10-CM | POA: Diagnosis not present

## 2020-04-24 DIAGNOSIS — Z20822 Contact with and (suspected) exposure to covid-19: Secondary | ICD-10-CM | POA: Diagnosis not present

## 2020-04-24 DIAGNOSIS — R0981 Nasal congestion: Secondary | ICD-10-CM | POA: Diagnosis not present

## 2020-04-24 DIAGNOSIS — R0602 Shortness of breath: Secondary | ICD-10-CM | POA: Insufficient documentation

## 2020-04-24 DIAGNOSIS — J019 Acute sinusitis, unspecified: Secondary | ICD-10-CM | POA: Diagnosis not present

## 2020-04-24 MED ORDER — AMOXICILLIN 875 MG PO TABS: 875.0000 mg | ORAL_TABLET | Freq: Two times a day (BID) | ORAL | 0 refills | Status: DC

## 2020-04-24 MED ORDER — BENZONATATE 100 MG PO CAPS: 100.0000 mg | ORAL_CAPSULE | Freq: Three times a day (TID) | ORAL | 0 refills | Status: DC | PRN

## 2020-04-24 NOTE — ED Triage Notes (Signed)
Cough for 2 weeks. Suspects a fever. Congestion, sinus pain and pressure.

## 2020-04-24 NOTE — ED Provider Notes (Signed)
[facility]   MRN: [id number] DOB: 1956/09/01  Subjective:   [name] is a 64 y.o. female presenting for 2-week history of persistent worsening sinus pain, sinus congestion, cough and intermittent shortness of breath.  Patient has a history of mild COPD, allergic rhinitis.  She takes albuterol as needed, Flonase daily.  Reports that she is used her albuterol inhaler some over the past couple of weeks.  Has had her Covid vaccination completed, last dose was about a month ago.  She would like to be tested for COVID-19 to make sure she does not have this.   Current Facility-Administered Medications:  .  methylPREDNISolone acetate (DEPO-MEDROL) injection 40 mg, 40 mg, Other, Once, Magnus Sinning, MD  Current Outpatient Medications:  .  Acetylcarnitine HCl (ACETYL-L-CARNITINE HCL) POWD, by Does not apply route., Disp: , Rfl:  .  albuterol (PROVENTIL HFA;VENTOLIN HFA) 108 (90 Base) MCG/ACT inhaler, Inhale 1-2 puffs into the lungs every 6 (six) hours as needed for wheezing or shortness of breath., Disp: , Rfl:  .  Alcohol Swabs (ALCOHOL PREP) 70 % PADS, , Disp: , Rfl:  .  alendronate (FOSAMAX) 70 MG tablet, Take 70 mg by mouth every Monday. Take with a full glass of water on an empty stomach., Disp: , Rfl:  .  amitriptyline (ELAVIL) 100 MG tablet, Take 1 tablet (100 mg total) by mouth at bedtime., Disp: 30 tablet, Rfl: 5 .  AQUALANCE LANCETS 30G MISC, , Disp: , Rfl:  .  Ascorbic Acid (VITAMIN C) 500 MG CAPS, Take 500 mg by mouth at bedtime. , Disp: , Rfl:  .  atorvastatin (LIPITOR) 40 MG tablet, Take 40 mg by mouth daily., Disp: , Rfl:  .  Blood Glucose Calibration (OT ULTRA/FASTTK CNTRL SOLN) SOLN, , Disp: , Rfl:  .  Blood Glucose Monitoring Suppl (ONE TOUCH ULTRA 2) w/Device KIT, , Disp: , Rfl:  .  busPIRone (BUSPAR) 10 MG tablet, TAKE 1 TABLET(10 MG) BY MOUTH TWICE DAILY, Disp: 180 tablet, Rfl: 0 .  Calcium Carb-Cholecalciferol (CALCIUM 600+D3 PO), Take 1 tablet by mouth  daily., Disp: , Rfl:  .  cyanocobalamin 100 MCG tablet, Take by mouth., Disp: , Rfl:  .  esomeprazole (NEXIUM) 40 MG capsule, TK ONE C PO D FOR ANTIACID, Disp: , Rfl:  .  fluticasone (FLONASE) 50 MCG/ACT nasal spray, Place 1 spray into both nostrils 2 (two) times daily. , Disp: , Rfl: 1 .  furosemide (LASIX) 40 MG tablet, Take 1 tablet by mouth daily., Disp: , Rfl:  .  gabapentin (NEURONTIN) 300 MG capsule, Take 300 mg by mouth 2 (two) times daily., Disp: , Rfl:  .  glipiZIDE (GLUCOTROL XL) 10 MG 24 hr tablet, TK 1 T PO QAM FOR DIABETES, Disp: , Rfl:  .  hydrocortisone 1 % ointment, Apply 1 application topically 2 (two) times daily., Disp: 30 g, Rfl: 0 .  lamoTRIgine (LAMICTAL) 150 MG tablet, TAKE 1 TABLET(150 MG) BY MOUTH DAILY, Disp: 90 tablet, Rfl: 0 .  Lancet Devices (ADJUSTABLE LANCING DEVICE) MISC, , Disp: , Rfl:  .  Lysine 500 MG CAPS, Take by mouth. (Patient not taking: Reported on 04/11/2020), Disp: , Rfl:  .  Melatonin 10 MG TABS, Take 10 mg by mouth at bedtime., Disp: , Rfl:  .  metFORMIN (GLUCOPHAGE-XR) 500 MG 24 hr tablet, Take 500 mg by mouth daily with breakfast. IN THE MORNING., Disp: , Rfl:  .  methenamine (HIPREX) 1 g tablet, Take 1 g by mouth at bedtime. ,  Disp: , Rfl:  .  methocarbamol (ROBAXIN) 500 MG tablet, Take 1 tablet (500 mg total) by mouth 2 (two) times daily as needed., Disp: 20 tablet, Rfl: 0 .  Multiple Vitamin (MULTIVITAMIN WITH MINERALS) TABS tablet, Take 1 tablet by mouth daily. Women's Multivitamin., Disp: , Rfl:  .  MYRBETRIQ 50 MG TB24 tablet, Take 50 mg by mouth daily., Disp: , Rfl:  .  ONE TOUCH ULTRA TEST test strip, , Disp: , Rfl:  .  Polyethyl Glycol-Propyl Glycol (LUBRICANT EYE DROPS) 0.4-0.3 % SOLN, Place 1-2 drops into both eyes 3 (three) times daily as needed (for dry eyes.)., Disp: , Rfl:  .  potassium chloride (KLOR-CON) 10 MEQ tablet, Take 10 mEq by mouth daily. (Patient not taking: Reported on 04/11/2020), Disp: , Rfl:  .  ranitidine (ZANTAC) 300 MG  tablet, Take 300 mg by mouth daily., Disp: , Rfl:  .  spironolactone (ALDACTONE) 100 MG tablet, Take 100 mg by mouth daily. , Disp: , Rfl: 5 .  tamsulosin (FLOMAX) 0.4 MG CAPS capsule, Take 0.4 mg by mouth at bedtime., Disp: , Rfl: 11 .  traMADol (ULTRAM) 50 MG tablet, Take 1 tablet (50 mg total) by mouth every 6 (six) hours as needed for moderate pain., Disp: 30 tablet, Rfl: 0 .  triamterene-hydrochlorothiazide (MAXZIDE) 75-50 MG tablet, Take 1 tablet by mouth daily. (Patient not taking: Reported on 04/11/2020), Disp: , Rfl: 3   Allergies  Allergen Reactions  . Miconazole Nitrate Hives and Itching  . Sulfonamide Derivatives Hives and Itching    Past Medical History:  Diagnosis Date  . Acromegaly ([place])   . Anxiety   . Arthritis   . Bacterial infection   . Benign paroxysmal positional vertigo 02/24/2013  . Bipolar disorder ([place])   . Bladder infection   . Cancer ([place])    skin cancer  . Cerebral palsy ([place])   . Colitis   . COPD (chronic obstructive pulmonary disease) (HCC)    mild emphysema  . Depression    bi polar  . Diabetes mellitus, type II ([place])   . Dysplasia of cervix   . GERD (gastroesophageal reflux disease)   . Headache(784.0)   . High cholesterol   . History of chicken pox   . History of measles   . History of mumps   . IBS (irritable bowel syndrome)   . Kidney infection   . Melanoma in situ ([place])   . Neck mass   . Neuromuscular disorder ([place])   . Ovarian cyst   . Poor circulation   . Stenosis, cervical spine 05/16  . Trichomonas   . UTI (urinary tract infection)    current  . Yeast infection      Past Surgical History:  Procedure Laterality Date  . ANTERIOR CERVICAL DECOMP/DISCECTOMY FUSION N/A 05/25/2016   Procedure: Cervical five-six Anterior cervical decompression/diskectomy/fusion;  Surgeon: Ashok Pall, MD;  Location: [facility];  Service: Neurosurgery;  Laterality: N/A;  . APPENDECTOMY    . CARPAL TUNNEL RELEASE    . CARPAL TUNNEL RELEASE Right  08/02/2017   Procedure: CARPAL TUNNEL RELEASE RIGHT;  Surgeon: Ashok Pall, MD;  Location: [facility];  Service: Neurosurgery;  Laterality: Right;  CARPAL TUNNEL RELEASE RIGHT  . CARPAL TUNNEL RELEASE Left 11/29/2017   Procedure: CARPAL TUNNEL RELEASE LEFT;  Surgeon: Ashok Pall, MD;  Location: [facility];  Service: Neurosurgery;  Laterality: Left;  CARPAL TUNNEL RELEASE LEFT  . COLONOSCOPY WITH PROPOFOL N/A 11/29/2016   Procedure: COLONOSCOPY WITH PROPOFOL;  Surgeon:  [name], MD;  Location: [facility];  Service: Endoscopy;  Laterality: N/A;  . ESOPHAGOGASTRODUODENOSCOPY (EGD) WITH PROPOFOL N/A 11/29/2016   Procedure: ESOPHAGOGASTRODUODENOSCOPY (EGD) WITH PROPOFOL;  Surgeon: [name], MD;  Location: [facility];  Service: Endoscopy;  Laterality: N/A;  . IR INJECT/THERA/INC NEEDLE/CATH/PLC EPI/CERV/THOR Howell Rucks  03/04/2020  . JOINT REPLACEMENT    . knee rotation    . LEG TENDON SURGERY    . OVARIAN CYST REMOVAL    . REPLACEMENT TOTAL KNEE BILATERAL  2006  . rotator cuff surgery      Family History  Problem Relation Age of Onset  . Suicidality Father   . Depression Father   . Alcohol abuse Father   . Emphysema Father        smoked  . Drug abuse Brother   . Alcohol abuse Brother   . Esophageal cancer Brother   . Depression Maternal Aunt   . Alcohol abuse Brother   . Anxiety disorder Daughter   . Depression Daughter   . Suicidality Daughter   . Alcohol abuse Daughter   . Drug abuse Son   . Alcohol abuse Son   . Emphysema Sister        smoked    Social History   Tobacco Use  . Smoking status: Former Smoker    Packs/day: 1.00    Years: 31.00    Pack years: 31.00    Types: Cigarettes    Quit date: 11/18/1999    Years since quitting: 20.4  . Smokeless tobacco: Never Used  Vaping Use  . Vaping Use: Never used  Substance Use Topics  . Alcohol use: Yes    Alcohol/week: 0.0 standard drinks    Comment: occ.  . Drug use: No    Review of Systems  Constitutional: Positive  for fever and malaise/fatigue.  HENT: Positive for congestion and sinus pain. Negative for ear pain and sore throat.   Eyes: Negative for discharge and redness.  Respiratory: Positive for cough and shortness of breath. Negative for hemoptysis and wheezing.   Cardiovascular: Negative for chest pain.  Gastrointestinal: Negative for abdominal pain, diarrhea, nausea and vomiting.  Genitourinary: Negative for dysuria, flank pain and hematuria.  Musculoskeletal: Negative for myalgias.  Skin: Negative for rash.  Neurological: Negative for dizziness, weakness and headaches.  Psychiatric/Behavioral: Negative for depression and substance abuse.     Objective:   Vitals: BP 116/62   Pulse 89   Temp 98.8 F (37.1 C) (Oral)   Resp 18   SpO2 97%   Physical Exam Constitutional:      General: She is not in acute distress.    Appearance: Normal appearance. She is well-developed. She is obese. She is not ill-appearing, toxic-appearing or diaphoretic.  HENT:     Head: Normocephalic and atraumatic.     Right Ear: Tympanic membrane and ear canal normal. No drainage or tenderness. No middle ear effusion. Tympanic membrane is not erythematous.     Left Ear: Tympanic membrane and ear canal normal. No drainage or tenderness.  No middle ear effusion. Tympanic membrane is not erythematous.     Nose: Congestion present. No rhinorrhea.     Mouth/Throat:     Mouth: Mucous membranes are moist. No oral lesions.     Pharynx: No pharyngeal swelling, oropharyngeal exudate, posterior oropharyngeal erythema or uvula swelling.     Tonsils: No tonsillar exudate or tonsillar abscesses.  Eyes:     General: No scleral icterus.       Right  eye: No discharge.        Left eye: No discharge.     Extraocular Movements: Extraocular movements intact.     Right eye: Normal extraocular motion.     Left eye: Normal extraocular motion.     Conjunctiva/sclera: Conjunctivae normal.     Pupils: Pupils are equal, round, and  reactive to light.  Cardiovascular:     Rate and Rhythm: Normal rate and regular rhythm.     Pulses: Normal pulses.     Heart sounds: Normal heart sounds. No murmur heard.  No friction rub. No gallop.   Pulmonary:     Effort: Pulmonary effort is normal. No respiratory distress.     Breath sounds: Normal breath sounds. No stridor. No wheezing, rhonchi or rales.  Musculoskeletal:     Cervical back: Normal range of motion and neck supple.  Lymphadenopathy:     Cervical: No cervical adenopathy.  Skin:    General: Skin is warm and dry.     Findings: No rash.  Neurological:     General: No focal deficit present.     Mental Status: She is alert and oriented to person, place, and time.  Psychiatric:        Mood and Affect: Mood normal.        Behavior: Behavior normal.        Thought Content: Thought content normal.        Judgment: Judgment normal.     Assessment and Plan :   PDMP not reviewed this encounter.  1. Acute sinusitis, recurrence not specified, unspecified location   2. Cough   3. Nasal congestion   4. Shortness of breath     Will start empiric treatment for sinusitis with amoxicillin.  Recommended supportive care otherwise including the use of oral antihistamine. Hold Flonase until sx completely resolved. Follow up with PCP. COVID 19 testing pending, x-ray deferred given clear lung sounds and excellent vital signs. Counseled patient on potential for adverse effects with medications prescribed/recommended today, ER and return-to-clinic precautions discussed, patient verbalized understanding.    Jaynee Eagles, PA-C 04/24/20 1521

## 2020-04-24 NOTE — Discharge Instructions (Signed)
Please hold off on using Flonase for now.  Instead use cetirizine/Zyrtec at 10 mg once daily.  After you finish the antibiotic course and have completely resolved symptoms then you can restart a new Flonase nasal spray.

## 2020-04-25 DIAGNOSIS — R351 Nocturia: Secondary | ICD-10-CM | POA: Diagnosis not present

## 2020-04-25 DIAGNOSIS — R339 Retention of urine, unspecified: Secondary | ICD-10-CM | POA: Diagnosis not present

## 2020-04-25 DIAGNOSIS — R32 Unspecified urinary incontinence: Secondary | ICD-10-CM | POA: Diagnosis not present

## 2020-04-25 LAB — SARS CORONAVIRUS 2 (TAT 6-24 HRS): SARS Coronavirus 2: NEGATIVE

## 2020-05-10 DIAGNOSIS — R609 Edema, unspecified: Secondary | ICD-10-CM | POA: Diagnosis not present

## 2020-05-10 DIAGNOSIS — M810 Age-related osteoporosis without current pathological fracture: Secondary | ICD-10-CM | POA: Diagnosis not present

## 2020-05-10 DIAGNOSIS — G629 Polyneuropathy, unspecified: Secondary | ICD-10-CM | POA: Diagnosis not present

## 2020-05-10 DIAGNOSIS — K117 Disturbances of salivary secretion: Secondary | ICD-10-CM | POA: Diagnosis not present

## 2020-05-10 DIAGNOSIS — K760 Fatty (change of) liver, not elsewhere classified: Secondary | ICD-10-CM | POA: Diagnosis not present

## 2020-05-10 DIAGNOSIS — E782 Mixed hyperlipidemia: Secondary | ICD-10-CM | POA: Diagnosis not present

## 2020-05-10 DIAGNOSIS — J439 Emphysema, unspecified: Secondary | ICD-10-CM | POA: Diagnosis not present

## 2020-05-10 DIAGNOSIS — Z6834 Body mass index (BMI) 34.0-34.9, adult: Secondary | ICD-10-CM | POA: Diagnosis not present

## 2020-05-10 DIAGNOSIS — E118 Type 2 diabetes mellitus with unspecified complications: Secondary | ICD-10-CM | POA: Diagnosis not present

## 2020-05-11 DIAGNOSIS — G809 Cerebral palsy, unspecified: Secondary | ICD-10-CM | POA: Diagnosis not present

## 2020-05-11 DIAGNOSIS — G35 Multiple sclerosis: Secondary | ICD-10-CM | POA: Diagnosis not present

## 2020-05-16 DIAGNOSIS — R339 Retention of urine, unspecified: Secondary | ICD-10-CM | POA: Diagnosis not present

## 2020-05-16 DIAGNOSIS — R351 Nocturia: Secondary | ICD-10-CM | POA: Diagnosis not present

## 2020-05-16 DIAGNOSIS — R32 Unspecified urinary incontinence: Secondary | ICD-10-CM | POA: Diagnosis not present

## 2020-06-01 ENCOUNTER — Other Ambulatory Visit (HOSPITAL_COMMUNITY): Payer: Self-pay | Admitting: Psychiatry

## 2020-06-01 DIAGNOSIS — F319 Bipolar disorder, unspecified: Secondary | ICD-10-CM

## 2020-06-01 DIAGNOSIS — F419 Anxiety disorder, unspecified: Secondary | ICD-10-CM

## 2020-06-02 ENCOUNTER — Other Ambulatory Visit: Payer: Self-pay

## 2020-06-02 ENCOUNTER — Telehealth (HOSPITAL_COMMUNITY): Payer: Medicare HMO | Admitting: Psychiatry

## 2020-06-02 DIAGNOSIS — R6889 Other general symptoms and signs: Secondary | ICD-10-CM | POA: Diagnosis not present

## 2020-06-21 ENCOUNTER — Telehealth (HOSPITAL_COMMUNITY): Payer: Self-pay

## 2020-06-21 ENCOUNTER — Other Ambulatory Visit (HOSPITAL_COMMUNITY): Payer: Self-pay | Admitting: *Deleted

## 2020-06-21 DIAGNOSIS — F419 Anxiety disorder, unspecified: Secondary | ICD-10-CM

## 2020-06-21 DIAGNOSIS — F319 Bipolar disorder, unspecified: Secondary | ICD-10-CM

## 2020-06-21 MED ORDER — BUSPIRONE HCL 10 MG PO TABS: ORAL_TABLET | ORAL | 0 refills | Status: DC

## 2020-06-21 NOTE — Telephone Encounter (Signed)
error 

## 2020-06-23 DIAGNOSIS — R339 Retention of urine, unspecified: Secondary | ICD-10-CM | POA: Diagnosis not present

## 2020-06-23 DIAGNOSIS — R351 Nocturia: Secondary | ICD-10-CM | POA: Diagnosis not present

## 2020-06-23 DIAGNOSIS — R32 Unspecified urinary incontinence: Secondary | ICD-10-CM | POA: Diagnosis not present

## 2020-08-08 ENCOUNTER — Telehealth: Payer: Self-pay

## 2020-08-08 NOTE — Telephone Encounter (Signed)
Called patient no answer. Could not leave VM since mailbox was full.   Just need to advise. Dr Rosine Door cannot fill out [employer] form . Mendel Ryder states we have seen her for neck pain. She needs her other Dr. To fill out form.

## 2020-09-05 ENCOUNTER — Telehealth: Payer: Self-pay | Admitting: Orthopaedic Surgery

## 2020-09-05 NOTE — Telephone Encounter (Signed)
Pt called about a SCAT form so she can be approved for SCAT.   Pt states she bought the form in and hasn't heard anything from it..  Dr.Xu was who she last seen she has also seen Dr.Blackman and Dr. Sharol Given in the past.  She said Dr. Sharol Given is who filled out her application previously.

## 2020-09-08 NOTE — Telephone Encounter (Signed)
Mendel Ryder filled out

## 2020-09-11 NOTE — Progress Notes (Deleted)
NEUROLOGY FOLLOW UP OFFICE NOTE  LURDES HALTIWANGER [id number]   Subjective:  Nivea Wojdyla is a 64 year old rightt-handed female with cerebral palsy, acromegaly,cervical spinal stenosis s/p ACDF, s/p bilateral carpal tunnel surgery,Bipolar 1 disorder, and COPD follows up for headache.  UPDATE: Increased amitriptyline in June. Intensity:severe Duration:Very brief (seconds), not requiring acute management Frequency:occurs several times a day every other day. Frequency of abortive medication:none Current NSAIDS:none Current analgesics: Tramadol (rarely uses) Current triptans:none Current ergotamine:none Current anti-emetic:none Current muscle relaxants:none Current anti-anxiolytic: BuSpar Current sleep aide: melatonin Current Antihypertensive medications: Spironolactone, Maxzide Current Antidepressant medications: Amitriptyline141m Current Anticonvulsant medications: Lamotrigine 1577mdaily, gabapentin 3003mwice daily Current anti-CGRP:none Current Vitamins/Herbal/Supplements: Melatonin, MVI Current Antihistamines/Decongestants: Flonase Other therapy:none  Caffeine:Rarely drinks coffee. Drinks a Coke about twice a daily. Drinks tea. Diet:Soda. No water. Depression:mild; Anxiety:yes Other pain:Neck pain Sleep hygiene:  She sleeps okay but has daytime drowsiness.  She may fall asleep on her scooter.    HISTORY: Onset:Several years Location:Retro-orbital, right worse than left Quality:Throbbing/burning. Initial intensity:8/10. Shedenies new headache, thunderclap headache Sometimes has burning on crown. She does have chronic neck pain s/p surgery. Aura:none Premonitory Phase:none Postdrome:none Associated symptoms:None. Shedenies associated nausea, vomiting, photophobia, phonophobia, visual disturbance orunilateral numbness or weakness. Initial duration:Off and on throughout the day Initial  frequency:1 or 2 days a week. Triggers:none Exacerbating factors:none Relieving factors:no Activity:Able to function.  MRI/MRA of brain from 03/10/13 personally reviewed and demonstrated tiny nonspecific pervintricular and subcortical white matter changes. Repeat MRI of brain from 09/08/14 personally reviewed and stable. For cervical and occipital pain, she had an MRI of the cervical spine on 02/11/15, which showed multilevel left-sided foraminal stenosis, including at C6-7, as well as bilaterally at C5-6, and with moderate central canal stenosis at C5-6. For cervical radiculitisshe is treated by her orthopedist/pain specialist.    Past NSAIDS: naproxen Past analgesics: Tylenol, Excedrin Migraine Past abortive triptans:none Past abortive ergotamine:none Past muscle relaxants: Flexeril;tizanidine4mg69mree times daily (drowsiness) Past anti-emetic: Promethazine, Zofran ODT 8mg 13mt antihypertensive medications:none Past antidepressant medications:Zoloft Past anticonvulsant medications:none Past anti-CGRP:none Past vitamins/Herbal/Supplements:none Past antihistamines/decongestants: Benadryl Other past therapies:none   Family history of headache:sister  PAST MEDICAL HISTORY: Past Medical History:  Diagnosis Date  . Acromegaly (HCC) [place] Anxiety   . Arthritis   . Bacterial infection   . Benign paroxysmal positional vertigo 02/24/2013  . Bipolar disorder (HCC) [place] Bladder infection   . Cancer (HCC) [place] cancer  . Cerebral palsy (HCC) [place] Colitis   . COPD (chronic obstructive pulmonary disease) (HCC)    mild emphysema  . Depression    bi polar  . Diabetes mellitus, type II (HCC) [place] Dysplasia of cervix   . GERD (gastroesophageal reflux disease)   . Headache(784.0)   . High cholesterol   . History of chicken pox   . History of measles   . History of mumps   . IBS (irritable bowel syndrome)   . Kidney infection   . Melanoma in  situ (HCC) [place] Neck mass   . Neuromuscular disorder (HCC) [place] Ovarian cyst   . Poor circulation   . Stenosis, cervical spine 05/16  . Trichomonas   . UTI (urinary tract infection)    current  . Yeast infection     MEDICATIONS: Current Outpatient Medications on File Prior to Visit  Medication Sig Dispense Refill  . Acetylcarnitine HCl (ACETYL-L-CARNITINE HCL) POWD by Does not apply route.    .[name]  albuterol (PROVENTIL HFA;VENTOLIN HFA) 108 (90 Base) MCG/ACT inhaler Inhale 1-2 puffs into the lungs every 6 (six) hours as needed for wheezing or shortness of breath.    . Alcohol Swabs (ALCOHOL PREP) 70 % PADS     . alendronate (FOSAMAX) 70 MG tablet Take 70 mg by mouth every Monday. Take with a full glass of water on an empty stomach.    [name] amitriptyline (ELAVIL) 100 MG tablet Take 1 tablet (100 mg total) by mouth at bedtime. 30 tablet 5  . amoxicillin (AMOXIL) 875 MG tablet Take 1 tablet (875 mg total) by mouth 2 (two) times daily. 14 tablet 0  . AQUALANCE LANCETS 30G MISC     . Ascorbic Acid (VITAMIN C) 500 MG CAPS Take 500 mg by mouth at bedtime.     [name] atorvastatin (LIPITOR) 40 MG tablet Take 40 mg by mouth daily.    . benzonatate (TESSALON) 100 MG capsule Take 1-2 capsules (100-200 mg total) by mouth 3 (three) times daily as needed. 60 capsule 0  . Blood Glucose Calibration (OT ULTRA/FASTTK CNTRL SOLN) SOLN     . Blood Glucose Monitoring Suppl (ONE TOUCH ULTRA 2) w/Device KIT     . busPIRone (BUSPAR) 10 MG tablet TAKE 1 TABLET(10 MG) BY MOUTH TWICE DAILY 60 tablet 0  . Calcium Carb-Cholecalciferol (CALCIUM 600+D3 PO) Take 1 tablet by mouth daily.    . cyanocobalamin 100 MCG tablet Take by mouth.    . esomeprazole (NEXIUM) 40 MG capsule TK ONE C PO D FOR ANTIACID    . fluticasone (FLONASE) 50 MCG/ACT nasal spray Place 1 spray into both nostrils 2 (two) times daily.   1  . furosemide (LASIX) 40 MG tablet Take 1 tablet by mouth daily.    [name] gabapentin (NEURONTIN) 300 MG capsule Take 300 mg by  mouth 2 (two) times daily.    [name] glipiZIDE (GLUCOTROL XL) 10 MG 24 hr tablet TK 1 T PO QAM FOR DIABETES    . hydrocortisone 1 % ointment Apply 1 application topically 2 (two) times daily. 30 g 0  . lamoTRIgine (LAMICTAL) 150 MG tablet TAKE 1 TABLET(150 MG) BY MOUTH DAILY 90 tablet 0  . Lancet Devices (ADJUSTABLE LANCING DEVICE) MISC     . Lysine 500 MG CAPS Take by mouth. (Patient not taking: Reported on 04/11/2020)    . Melatonin 10 MG TABS Take 10 mg by mouth at bedtime.    . metFORMIN (GLUCOPHAGE-XR) 500 MG 24 hr tablet Take 500 mg by mouth daily with breakfast. IN THE MORNING.    . methenamine (HIPREX) 1 g tablet Take 1 g by mouth at bedtime.     . methocarbamol (ROBAXIN) 500 MG tablet Take 1 tablet (500 mg total) by mouth 2 (two) times daily as needed. 20 tablet 0  . Multiple Vitamin (MULTIVITAMIN WITH MINERALS) TABS tablet Take 1 tablet by mouth daily. Women's Multivitamin.    [name] MYRBETRIQ 50 MG TB24 tablet Take 50 mg by mouth daily.    . ONE TOUCH ULTRA TEST test strip     . Polyethyl Glycol-Propyl Glycol (LUBRICANT EYE DROPS) 0.4-0.3 % SOLN Place 1-2 drops into both eyes 3 (three) times daily as needed (for dry eyes.).    [name] potassium chloride (KLOR-CON) 10 MEQ tablet Take 10 mEq by mouth daily. (Patient not taking: Reported on 04/11/2020)    . ranitidine (ZANTAC) 300 MG tablet Take 300 mg by mouth daily.    [name] spironolactone (ALDACTONE) 100 MG tablet Take 100 mg by  mouth daily.   5  . tamsulosin (FLOMAX) 0.4 MG CAPS capsule Take 0.4 mg by mouth at bedtime.  11  . traMADol (ULTRAM) 50 MG tablet Take 1 tablet (50 mg total) by mouth every 6 (six) hours as needed for moderate pain. 30 tablet 0  . triamterene-hydrochlorothiazide (MAXZIDE) 75-50 MG tablet Take 1 tablet by mouth daily. (Patient not taking: Reported on 04/11/2020)  3   Current Facility-Administered Medications on File Prior to Visit  Medication Dose Route Frequency Provider Last Rate Last Admin  . methylPREDNISolone acetate  (DEPO-MEDROL) injection 40 mg  40 mg Other Once Magnus Sinning, MD        ALLERGIES: Allergies  Allergen Reactions  . Miconazole Nitrate Hives and Itching  . Sulfonamide Derivatives Hives and Itching    FAMILY HISTORY: Family History  Problem Relation Age of Onset  . Suicidality Father   . Depression Father   . Alcohol abuse Father   . Emphysema Father        smoked  . Drug abuse Brother   . Alcohol abuse Brother   . Esophageal cancer Brother   . Depression Maternal Aunt   . Alcohol abuse Brother   . Anxiety disorder Daughter   . Depression Daughter   . Suicidality Daughter   . Alcohol abuse Daughter   . Drug abuse Son   . Alcohol abuse Son   . Emphysema Sister        smoked   SOCIAL HISTORY: Social History   Socioeconomic History  . Marital status: Divorced    Spouse name: Not on file  . Number of children: Not on file  . Years of education: graduate  . Highest education level: Not on file  Occupational History  . Occupation: Disabled    Employer: [employer]  Tobacco Use  . Smoking status: Former Smoker    Packs/day: 1.00    Years: 31.00    Pack years: 31.00    Types: Cigarettes    Quit date: 11/18/1999    Years since quitting: 20.8  . Smokeless tobacco: Never Used  Vaping Use  . Vaping Use: Never used  Substance and Sexual Activity  . Alcohol use: Yes    Alcohol/week: 0.0 standard drinks    Comment: occ.  . Drug use: No  . Sexual activity: Never  Other Topics Concern  . Not on file  Social History Narrative   Right handed, Disabled. Live boyfriend , [name], Caffeine rare.  Disabled.   One level home. Bachelors degree   Social Determinants of Health   Financial Resource Strain:   . Difficulty of Paying Living Expenses: Not on file  Food Insecurity:   . Worried About Charity fundraiser in the Last Year: Not on file  . Ran Out of Food in the Last Year: Not on file  Transportation Needs:   . Lack of Transportation (Medical): Not on  file  . Lack of Transportation (Non-Medical): Not on file  Physical Activity:   . Days of Exercise per Week: Not on file  . Minutes of Exercise per Session: Not on file  Stress:   . Feeling of Stress : Not on file  Social Connections:   . Frequency of Communication with Friends and Family: Not on file  . Frequency of Social Gatherings with Friends and Family: Not on file  . Attends Religious Services: Not on file  . Active Member of Clubs or Organizations: Not on file  . Attends Archivist Meetings: Not  on file  . Marital Status: Not on file  Intimate Partner Violence:   . Fear of Current or Ex-Partner: Not on file  . Emotionally Abused: Not on file  . Physically Abused: Not on file  . Sexually Abused: Not on file     Objective:  *** General: No acute distress.  Patient appears ***-groomed.   Head:  Normocephalic/atraumatic Eyes:  Fundi examined but not visualized Neck: supple, no paraspinal tenderness, full range of motion Heart:  Regular rate and rhythm Lungs:  Clear to auscultation bilaterally Back: No paraspinal tenderness Neurological Exam: alert and oriented to person, place, and time. Attention span and concentration intact, recent and remote memory intact, fund of knowledge intact.  Speech fluent and not dysarthric, language intact.  CN II-XII intact. Bulk and tone normal, muscle strength 5/5 throughout.  Sensation to light touch, temperature and vibration intact.  Deep tendon reflexes 2+ throughout, toes downgoing.  Finger to nose and heel to shin testing intact.  Gait normal, Romberg negative.   Assessment/Plan:   ***  Metta Clines, DO  CC:  Jenean Lindau, MD

## 2020-09-12 ENCOUNTER — Ambulatory Visit: Payer: Medicare HMO | Admitting: Neurology

## 2020-09-12 ENCOUNTER — Encounter: Payer: Self-pay | Admitting: Neurology

## 2020-09-12 DIAGNOSIS — Z029 Encounter for administrative examinations, unspecified: Secondary | ICD-10-CM

## 2020-09-12 NOTE — Telephone Encounter (Signed)
These SCAT forms were mailed out to patient. Called patient to make sure she received them. Yes she has received SCAT Forms.

## 2020-09-17 ENCOUNTER — Encounter (HOSPITAL_COMMUNITY): Payer: Self-pay | Admitting: *Deleted

## 2020-09-17 ENCOUNTER — Other Ambulatory Visit: Payer: Self-pay

## 2020-09-17 ENCOUNTER — Ambulatory Visit (HOSPITAL_COMMUNITY)
Admission: EM | Admit: 2020-09-17 | Discharge: 2020-09-17 | Disposition: A | Discharge status: Home / Self Care | Payer: Medicare (Managed Care) | Attending: Family Medicine | Admitting: Family Medicine

## 2020-09-17 DIAGNOSIS — T148XXA Other injury of unspecified body region, initial encounter: Secondary | ICD-10-CM | POA: Diagnosis present

## 2020-09-17 DIAGNOSIS — N390 Urinary tract infection, site not specified: Secondary | ICD-10-CM

## 2020-09-17 DIAGNOSIS — R600 Localized edema: Secondary | ICD-10-CM | POA: Diagnosis present

## 2020-09-17 DIAGNOSIS — R8271 Bacteriuria: Secondary | ICD-10-CM

## 2020-09-17 LAB — POCT URINALYSIS DIPSTICK, ED / UC
Bilirubin Urine: NEGATIVE
Glucose, UA: NEGATIVE mg/dL
Ketones, ur: 15 mg/dL — AB
Nitrite: NEGATIVE
Protein, ur: 30 mg/dL — AB
Specific Gravity, Urine: >=1.03 (ref 1.005–1.030)
Urobilinogen, UA: 0.2 mg/dL (ref 0.0–1.0)
pH: 5.5 (ref 5.0–8.0)

## 2020-09-17 MED ORDER — DOXYCYCLINE HYCLATE 100 MG PO TABS: 100.0000 mg | ORAL_TABLET | Freq: Two times a day (BID) | ORAL | 0 refills | Status: DC

## 2020-09-17 NOTE — ED Notes (Signed)
Pt urinary leg bag drained  And drainage port cleaned with alcohol wipes and plan to collect urine sample when urine is present in leg bag.

## 2020-09-17 NOTE — ED Triage Notes (Signed)
Pt reports A UTI  On last month with sepsis and hospital admission. Pt has had 3 PCP  Visits post DC from Hospital and has been told she still has a UTI . Pt has had a Foley Catheter for one year. Pt reports she  Changes  her own foley cath every 2 weeks.

## 2020-09-17 NOTE — ED Notes (Signed)
Left foot wound cleansed and clean dressing placed

## 2020-09-17 NOTE — ED Notes (Signed)
PT also reports she hit the 3rd Lt toe on a door at her house. Loose skin observed on toe.

## 2020-09-18 LAB — URINE CULTURE

## 2020-09-18 NOTE — ED Provider Notes (Addendum)
[facility]    CSN: [id number] Arrival date & time: 09/17/20  1453      History   Chief Complaint Chief Complaint  Patient presents with  . Urinary Tract Infection    HPI [name] is a 64 y.o. female.   Patient presenting today requesting a urinalysis as she states the urine in her foley bag has some mucus mixed in with it. Normal color, odor and has been afebrile and asymptomatic though of note she was admitted about a month ago for UTI sepsis with multiple organisms present. Completed abx upon discharge and has been feeling well overall since aside from the mucus. States she's gone to her PCP several times since d/c for re-check and that the infection has still been present but no abx have been administered so she wanted to come here today to recheck again. Denies fever, weakness, confusion, abdominal pain, N/V. Not trying anything OTC. Changes her own foley catheter every 2 weeks.   Also coming in today for evaluation of a skin tear on left 4th toe where she states she caught the toe on the side of the tub and the skin peeled back. Having swelling, intermittent bleeding and pain in the area. Has been trying to keep it clean and dry. Notes entire foot and ankle swollen but this has been near her baseline since being taken off the diuretics recently. Does have some numbness but she states it's been at her baseline from her diabetic neuropathy.      Past Medical History:  Diagnosis Date  . Acromegaly ([place])   . Anxiety   . Arthritis   . Bacterial infection   . Benign paroxysmal positional vertigo 02/24/2013  . Bipolar disorder ([place])   . Bladder infection   . Cancer ([place])    skin cancer  . Cerebral palsy ([place])   . Colitis   . COPD (chronic obstructive pulmonary disease) (HCC)    mild emphysema  . Depression    bi polar  . Diabetes mellitus, type II ([place])   . Dysplasia of cervix   . GERD (gastroesophageal reflux disease)   . Headache(784.0)   . High  cholesterol   . History of chicken pox   . History of measles   . History of mumps   . IBS (irritable bowel syndrome)   . Kidney infection   . Melanoma in situ ([place])   . Neck mass   . Neuromuscular disorder ([place])   . Ovarian cyst   . Poor circulation   . Stenosis, cervical spine 05/16  . Trichomonas   . UTI (urinary tract infection)    current  . Yeast infection     Patient Active Problem List   Diagnosis Date Noted  . Neck pain 11/21/2018  . Low back pain 08/19/2018  . Weakness of both lower extremities 08/19/2018  . Pain in finger of left hand 05/28/2018  . Pain in right hand 05/28/2018  . Pain in left elbow 04/02/2018  . Chronic right shoulder pain 07/16/2017  . Rotator cuff syndrome, right 05/13/2017  . Cervical radiculopathy 01/14/2017  . Colon cancer screening   . Encounter for screening for gastric cancer   . Osteoarthritis of spine with radiculopathy, cervical region 05/25/2016  . Postmenopausal bleeding 08/04/2015  . Acute respiratory failure ([place]) 07/19/2013  . Nocturnal hypoxemia 07/07/2013  . UTI (lower urinary tract infection) 07/07/2013  . Hypokalemia 07/07/2013  . Abdominal pain, acute, right lower quadrant 07/07/2013  . Benign paroxysmal positional vertigo  02/24/2013  . Edema of both legs 02/24/2013  . Dysplasia of cervix   . Melanoma in situ ([place])   . Bipolar 1 disorder ([place]) 02/08/2012  . IBS 12/04/2007  . RASH AND OTHER NONSPECIFIC SKIN ERUPTION 12/04/2007  . EMPHYSEMA by CT only 09/05/2007  . NECK MASS 09/05/2007  . 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 Cerebral palsy ([place]) 08/13/2007  . OTITIS EXTERNA, ACUTE 08/13/2007  . IRRITABLE BOWEL SYNDROME, HX OF 08/13/2007  . HYPERLIPIDEMIA 05/10/2007  . DEPRESSION 05/10/2007  . ALLERGIC RHINITIS 05/10/2007    Past Surgical History:  Procedure Laterality Date  . ANTERIOR CERVICAL DECOMP/DISCECTOMY FUSION N/A 05/25/2016   Procedure: Cervical five-six Anterior cervical decompression/diskectomy/fusion;   Surgeon: Ashok Pall, MD;  Location: [facility];  Service: Neurosurgery;  Laterality: N/A;  . APPENDECTOMY    . CARPAL TUNNEL RELEASE    . CARPAL TUNNEL RELEASE Right 08/02/2017   Procedure: CARPAL TUNNEL RELEASE RIGHT;  Surgeon: Ashok Pall, MD;  Location: [facility];  Service: Neurosurgery;  Laterality: Right;  CARPAL TUNNEL RELEASE RIGHT  . CARPAL TUNNEL RELEASE Left 11/29/2017   Procedure: CARPAL TUNNEL RELEASE LEFT;  Surgeon: Ashok Pall, MD;  Location: [facility];  Service: Neurosurgery;  Laterality: Left;  CARPAL TUNNEL RELEASE LEFT  . COLONOSCOPY WITH PROPOFOL N/A 11/29/2016   Procedure: COLONOSCOPY WITH PROPOFOL;  Surgeon: [name], MD;  Location: [facility];  Service: Endoscopy;  Laterality: N/A;  . ESOPHAGOGASTRODUODENOSCOPY (EGD) WITH PROPOFOL N/A 11/29/2016   Procedure: ESOPHAGOGASTRODUODENOSCOPY (EGD) WITH PROPOFOL;  Surgeon: [name], MD;  Location: [facility];  Service: Endoscopy;  Laterality: N/A;  . IR INJECT/THERA/INC NEEDLE/CATH/PLC EPI/CERV/THOR Howell Rucks  03/04/2020  . JOINT REPLACEMENT    . knee rotation    . LEG TENDON SURGERY    . OVARIAN CYST REMOVAL    . REPLACEMENT TOTAL KNEE BILATERAL  2006  . rotator cuff surgery      OB History    Gravida  3   Para  2   Term      Preterm      AB      Living  2     SAB      TAB      Ectopic      Multiple      Live Births               Home Medications    Prior to Admission medications   Medication Sig Start Date End Date Taking? Authorizing Provider  albuterol (PROVENTIL HFA;VENTOLIN HFA) 108 (90 Base) MCG/ACT inhaler Inhale 1-2 puffs into the lungs every 6 (six) hours as needed for wheezing or shortness of breath.   Yes [provider]  Alcohol Swabs (ALCOHOL PREP) 70 % PADS  05/05/18  Yes [provider]  alendronate (FOSAMAX) 70 MG tablet Take 70 mg by mouth every Monday. Take with a full glass of water on an empty stomach.   Yes [provider]  amitriptyline  (ELAVIL) 100 MG tablet Take 1 tablet (100 mg total) by mouth at bedtime. 04/11/20  Yes Pieter Partridge, DO  AQUALANCE LANCETS 30G MISC  04/25/18  Yes [provider]  Ascorbic Acid (VITAMIN C) 500 MG CAPS Take 500 mg by mouth at bedtime.    Yes [provider]  atorvastatin (LIPITOR) 40 MG tablet Take 40 mg by mouth daily.   Yes [provider]  Blood Glucose Calibration (OT ULTRA/FASTTK CNTRL SOLN) SOLN  05/05/18  Yes [provider]  Blood  Glucose Monitoring Suppl (ONE TOUCH ULTRA 2) w/Device KIT  05/05/18  Yes [provider]  busPIRone (BUSPAR) 10 MG tablet TAKE 1 TABLET(10 MG) BY MOUTH TWICE DAILY 06/21/20  Yes Arfeen, Arlyce Harman, MD  Calcium Carb-Cholecalciferol (CALCIUM 600+D3 PO) Take 1 tablet by mouth daily.   Yes [provider]  esomeprazole (NEXIUM) 40 MG capsule TK ONE C PO D FOR ANTIACID 06/05/19  Yes [provider]  fluticasone (FLONASE) 50 MCG/ACT nasal spray Place 1 spray into both nostrils 2 (two) times daily.  03/29/16  Yes [provider]  gabapentin (NEURONTIN) 300 MG capsule Take 300 mg by mouth 2 (two) times daily. 03/01/17  Yes [provider]  glipiZIDE (GLUCOTROL XL) 10 MG 24 hr tablet TK 1 T PO QAM FOR DIABETES 04/08/19  Yes [provider]  lamoTRIgine (LAMICTAL) 150 MG tablet TAKE 1 TABLET(150 MG) BY MOUTH DAILY 03/02/20  Yes Arfeen, Arlyce Harman, MD  Lancet Devices (ADJUSTABLE LANCING DEVICE) MISC  05/05/18  Yes [provider]  Melatonin 10 MG TABS Take 10 mg by mouth at bedtime.   Yes [provider]  metFORMIN (GLUCOPHAGE-XR) 500 MG 24 hr tablet Take 500 mg by mouth daily with breakfast. IN THE MORNING.   Yes [provider]  ONE TOUCH ULTRA TEST test strip  05/05/18  Yes [provider]  ranitidine (ZANTAC) 300 MG tablet Take 300 mg by mouth daily.   Yes [provider]  spironolactone (ALDACTONE) 100 MG tablet Take 100 mg by mouth daily.  01/28/15  Yes [provider]  Acetylcarnitine HCl (ACETYL-L-CARNITINE HCL) POWD by Does not apply route.    [provider]  amoxicillin (AMOXIL) 875 MG tablet Take 1 tablet (875 mg total) by mouth 2 (two) times daily. 04/24/20   Jaynee Eagles, PA-C  benzonatate (TESSALON) 100 MG capsule Take 1-2 capsules (100-200 mg total) by mouth 3 (three) times daily as needed. 04/24/20   Jaynee Eagles, PA-C  cyanocobalamin 100 MCG tablet Take by mouth.    [provider]  doxycycline (VIBRA-TABS) 100 MG tablet Take 1 tablet (100 mg total) by mouth 2 (two) times daily. 09/17/20   Volney American, PA-C  furosemide (LASIX) 40 MG tablet Take 1 tablet by mouth daily. 08/10/19   [provider]  hydrocortisone 1 % ointment Apply 1 application topically 2 (two) times daily. 08/14/19   Levin Erp, PA  Lysine 500 MG CAPS Take by mouth. Patient not taking: Reported on 04/11/2020    [provider]  methenamine (HIPREX) 1 g tablet Take 1 g by mouth at bedtime.     [provider]  methocarbamol (ROBAXIN) 500 MG tablet Take 1 tablet (500 mg total) by mouth 2 (two) times daily as needed. 12/18/19   Aundra Dubin, PA-C  Multiple Vitamin (MULTIVITAMIN WITH MINERALS) TABS tablet Take 1 tablet by mouth daily. Women's Multivitamin.    [provider]  MYRBETRIQ 50 MG TB24 tablet Take 50 mg by mouth daily. 11/10/19   [provider]  ondansetron (ZOFRAN) 4 MG tablet Take 4 mg by mouth every 8 (eight) hours as needed. 09/08/20   [provider]  Polyethyl Glycol-Propyl Glycol (LUBRICANT EYE DROPS) 0.4-0.3 % SOLN Place 1-2 drops into both eyes 3 (three) times daily as needed (for dry eyes.).    [provider]  potassium chloride (KLOR-CON) 10 MEQ tablet Take 10 mEq by mouth daily. Patient not taking: Reported on 04/11/2020 10/09/19   [provider]  potassium chloride (KLOR-CON) 10 MEQ tablet Take 10 mEq by mouth every morning.    [provider]  Propylene Glycol-Glycerin 1-0.3 % SOLN Apply 1 drop to eye in the morning and at bedtime.    [provider]  risedronate (ACTONEL) 150 MG tablet Take 150 mg by mouth every morning. 06/09/20   [provider]  tamsulosin (FLOMAX) 0.4 MG CAPS capsule Take 0.4 mg by mouth at bedtime. 03/06/16   [provider]  traMADol (ULTRAM) 50 MG tablet Take 1 tablet (50 mg total) by mouth every 6 (six) hours as needed for moderate pain. 11/29/17   Ashok Pall, MD  triamterene-hydrochlorothiazide (MAXZIDE) 75-50 MG tablet Take 1 tablet by mouth daily. Patient not taking: Reported on 04/11/2020 03/27/16   [provider]    Family History Family History  Problem Relation Age of Onset  . Suicidality Father   . Depression Father   . Alcohol abuse Father   . Emphysema Father        smoked  . Drug abuse Brother   . Alcohol abuse Brother   . Esophageal cancer Brother   . Depression Maternal Aunt   . Alcohol abuse Brother   . Anxiety disorder Daughter   . Depression Daughter   . Suicidality Daughter   . Alcohol abuse Daughter   . Drug abuse Son   . Alcohol abuse Son   . Emphysema Sister        smoked    Social History Social History   Tobacco Use  . Smoking status: Former Smoker    Packs/day: 1.00    Years: 31.00    Pack years: 31.00    Types: Cigarettes    Quit date: 11/18/1999    Years since quitting: 20.8  . Smokeless tobacco: Never Used  Vaping Use  . Vaping Use: Never used  Substance Use Topics  . Alcohol use: Yes    Alcohol/week: 0.0 standard drinks    Comment: occ.  . Drug use: No     Allergies   Miconazole nitrate and Sulfonamide derivatives   Review of Systems Review of Systems PER HPI    Physical Exam Triage Vital Signs ED Triage Vitals  Enc Vitals Group     BP 09/17/20 1552 (!) 100/56     Pulse Rate 09/17/20 1551 89     Resp 09/17/20 1551 18     Temp 09/17/20 1555 (!) 97.4 F (36.3 C)     Temp Source 09/17/20  1551 Oral     SpO2 09/17/20 1551 100 %     Weight --      Height 09/17/20 1524 _0  (1.702 m)     Head Circumference --      Peak Flow --      Pain Score 09/17/20 1524 0     Pain Loc --      Pain Edu? --      Excl. in [place]? --    No data found.  Updated Vital Signs BP (!) 100/56 (BP Location: Right Arm)   Pulse 89   Temp (!) 97.4 F (36.3 C) (Oral)   Resp 18   Ht _1  (1.702 m)   SpO2 100%   BMI 34.46 kg/m   Visual Acuity Right Eye Distance:   Left Eye Distance:   Bilateral Distance:    Right Eye Near:   Left Eye Near:    Bilateral Near:     Physical Exam Vitals and nursing note reviewed.  Constitutional:  General: She is not in acute distress.    Appearance: Normal appearance. She is not ill-appearing or diaphoretic.     Comments: In motorized scooter, she states partially due to her hx of cerebral palsy but also due to foot discomfort  HENT:     Head: Atraumatic.  Eyes:     Extraocular Movements: Extraocular movements intact.     Conjunctiva/sclera: Conjunctivae normal.  Cardiovascular:     Rate and Rhythm: Normal rate and regular rhythm.     Heart sounds: Normal heart sounds.     Comments: Weak pedal pulses palpable due to amount of swelling present. Cap refill distally around 2 seconds Pulmonary:     Effort: Pulmonary effort is normal.     Breath sounds: Normal breath sounds.  Abdominal:     General: Bowel sounds are normal. There is no distension.     Palpations: Abdomen is soft.     Tenderness: There is no abdominal tenderness. There is no guarding.  Musculoskeletal:        General: Swelling (2+ edema left lower leg particularly at ankle and foot diffusely) present. Normal range of motion.     Cervical back: Normal range of motion and neck supple.  Skin:    General: Skin is warm and dry.     Coloration: Skin is not pale.     Findings: Erythema (rubor of left lower extremity which she states is at her baseline) present.     Comments: Skin tear  with flap on dorsal surface of left 4th toe, no active bleeding, discharge, swelling beyond amount of generalized swelling present  Neurological:     Mental Status: She is alert and oriented to person, place, and time.     Sensory: Sensory deficit (at baseline for patient) present.  Psychiatric:        Mood and Affect: Mood normal.        Thought Content: Thought content normal.        Judgment: Judgment normal.       UC Treatments / Results  Labs (all labs ordered are listed, but only abnormal results are displayed) Labs Reviewed  POCT URINALYSIS DIPSTICK, ED / UC - Abnormal; Notable for the following components:      Result Value   Ketones, ur 15 (*)    Hgb urine dipstick TRACE (*)    Protein, ur 30 (*)    Leukocytes,Ua SMALL (*)    All other components within normal limits  URINE CULTURE    EKG   Radiology No results found.  Procedures Procedures (including critical care time)  Medications Ordered in UC Medications - No data to display  Initial Impression / Assessment and Plan / UC Course  I have reviewed the triage vital signs and the nursing notes.  Pertinent labs & imaging results that were available during my care of the patient were reviewed by me and considered in my medical decision making (see chart for details).     Overall, patient is well appearing today and declines weakness, fevers or other generalized sxs. She is mildly hypotensive in triage but states she does not feel dizzy or lethargic. She is afebrile with otherwise stable vital signs, U/A without signs convincing of UTI though given her trace leukocytes will send out for culture. Will start on doxycycline for her foot wound and discussed strict wound care instructions and home care techniques to help reduce swelling and encourage better wound healing. Strict PCP f/u in the next 4-5 days strongly  recommended given her risk factors for poor wound healing including diabetes, poor mobility, and apparent  peripheral vascular dz. ER precautions given for worsening sxs. She is agreeable to plan.   Final Clinical Impressions(s) / UC Diagnoses   Final diagnoses:  Bacteriuria  Abrasion  Lower extremity edema   Discharge Instructions   None    ED Prescriptions    Medication Sig Dispense Auth. Provider   doxycycline (VIBRA-TABS) 100 MG tablet Take 1 tablet (100 mg total) by mouth 2 (two) times daily. 14 tablet Volney American, [place]     PDMP not reviewed this encounter.   Volney American, [place] 09/18/20 [place], [place], [place] 09/18/20 1238

## 2020-09-29 ENCOUNTER — Other Ambulatory Visit: Payer: Self-pay

## 2020-09-29 ENCOUNTER — Telehealth (INDEPENDENT_AMBULATORY_CARE_PROVIDER_SITE_OTHER): Payer: Medicare (Managed Care) | Admitting: Psychiatry

## 2020-09-29 ENCOUNTER — Encounter (HOSPITAL_COMMUNITY): Payer: Self-pay | Admitting: Psychiatry

## 2020-09-29 DIAGNOSIS — F319 Bipolar disorder, unspecified: Secondary | ICD-10-CM | POA: Diagnosis not present

## 2020-09-29 DIAGNOSIS — F419 Anxiety disorder, unspecified: Secondary | ICD-10-CM

## 2020-09-29 MED ORDER — BUSPIRONE HCL 10 MG PO TABS: ORAL_TABLET | ORAL | 0 refills | Status: DC

## 2020-09-29 MED ORDER — LAMOTRIGINE 150 MG PO TABS: ORAL_TABLET | ORAL | 0 refills | Status: DC
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
## 2020-11-07 DIAGNOSIS — H524 Presbyopia: Secondary | ICD-10-CM | POA: Diagnosis not present

## 2020-11-07 DIAGNOSIS — R6889 Other general symptoms and signs: Secondary | ICD-10-CM | POA: Diagnosis not present

## 2020-11-07 DIAGNOSIS — E1136 Type 2 diabetes mellitus with diabetic cataract: Secondary | ICD-10-CM | POA: Diagnosis not present

## 2020-11-07 DIAGNOSIS — H52203 Unspecified astigmatism, bilateral: Secondary | ICD-10-CM | POA: Diagnosis not present

## 2020-11-07 DIAGNOSIS — H2513 Age-related nuclear cataract, bilateral: Secondary | ICD-10-CM | POA: Diagnosis not present

## 2020-11-07 DIAGNOSIS — H5213 Myopia, bilateral: Secondary | ICD-10-CM | POA: Diagnosis not present

## 2020-11-11 DIAGNOSIS — R6889 Other general symptoms and signs: Secondary | ICD-10-CM | POA: Diagnosis not present

## 2020-11-11 DIAGNOSIS — R3914 Feeling of incomplete bladder emptying: Secondary | ICD-10-CM | POA: Diagnosis not present

## 2020-11-11 DIAGNOSIS — N319 Neuromuscular dysfunction of bladder, unspecified: Secondary | ICD-10-CM | POA: Diagnosis not present

## 2020-12-02 DIAGNOSIS — E1140 Type 2 diabetes mellitus with diabetic neuropathy, unspecified: Secondary | ICD-10-CM | POA: Diagnosis not present

## 2020-12-02 DIAGNOSIS — G809 Cerebral palsy, unspecified: Secondary | ICD-10-CM | POA: Diagnosis not present

## 2020-12-02 DIAGNOSIS — E7800 Pure hypercholesterolemia, unspecified: Secondary | ICD-10-CM | POA: Diagnosis not present

## 2020-12-02 DIAGNOSIS — Z993 Dependence on wheelchair: Secondary | ICD-10-CM | POA: Diagnosis not present

## 2020-12-02 DIAGNOSIS — R6889 Other general symptoms and signs: Secondary | ICD-10-CM | POA: Diagnosis not present

## 2020-12-02 DIAGNOSIS — R7989 Other specified abnormal findings of blood chemistry: Secondary | ICD-10-CM | POA: Diagnosis not present

## 2020-12-16 DIAGNOSIS — R6889 Other general symptoms and signs: Secondary | ICD-10-CM | POA: Diagnosis not present

## 2020-12-16 DIAGNOSIS — R338 Other retention of urine: Secondary | ICD-10-CM | POA: Diagnosis not present

## 2020-12-16 DIAGNOSIS — R3914 Feeling of incomplete bladder emptying: Secondary | ICD-10-CM | POA: Diagnosis not present

## 2020-12-18 ENCOUNTER — Other Ambulatory Visit (HOSPITAL_COMMUNITY): Payer: Self-pay | Admitting: Psychiatry

## 2020-12-18 DIAGNOSIS — F319 Bipolar disorder, unspecified: Secondary | ICD-10-CM

## 2020-12-18 DIAGNOSIS — F419 Anxiety disorder, unspecified: Secondary | ICD-10-CM

## 2020-12-28 ENCOUNTER — Other Ambulatory Visit: Payer: Self-pay

## 2020-12-28 ENCOUNTER — Telehealth (INDEPENDENT_AMBULATORY_CARE_PROVIDER_SITE_OTHER): Payer: Medicare HMO | Admitting: Psychiatry

## 2020-12-28 DIAGNOSIS — F419 Anxiety disorder, unspecified: Secondary | ICD-10-CM

## 2020-12-28 DIAGNOSIS — F319 Bipolar disorder, unspecified: Secondary | ICD-10-CM

## 2020-12-28 MED ORDER — LAMOTRIGINE 150 MG PO TABS: ORAL_TABLET | ORAL | 0 refills | Status: DC

## 2020-12-28 MED ORDER — BUSPIRONE HCL 10 MG PO TABS
ORAL_TABLET | ORAL | 0 refills | Status: DC
Start: 2020-12-28 — End: 2021-05-26

## 2020-12-28 MED ORDER — AMITRIPTYLINE HCL 100 MG PO TABS
100.0000 mg | ORAL_TABLET | Freq: Every day | ORAL | 0 refills | Status: DC
Start: 2020-12-28 — End: 2021-05-26
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
## 2020-12-30 ENCOUNTER — Telehealth (HOSPITAL_COMMUNITY): Payer: Self-pay | Admitting: *Deleted

## 2020-12-30 DIAGNOSIS — R6889 Other general symptoms and signs: Secondary | ICD-10-CM | POA: Diagnosis not present

## 2020-12-30 DIAGNOSIS — N3941 Urge incontinence: Secondary | ICD-10-CM | POA: Diagnosis not present

## 2020-12-30 DIAGNOSIS — R3914 Feeling of incomplete bladder emptying: Secondary | ICD-10-CM | POA: Diagnosis not present

## 2020-12-30 DIAGNOSIS — N319 Neuromuscular dysfunction of bladder, unspecified: Secondary | ICD-10-CM | POA: Diagnosis not present

## 2020-12-30 NOTE — Telephone Encounter (Signed)
Writer has left several messages concerning getting med reconciliation done. Pt encouraged to call nurse back with medicaiton information.

## 2021-01-05 DIAGNOSIS — M810 Age-related osteoporosis without current pathological fracture: Secondary | ICD-10-CM | POA: Diagnosis not present

## 2021-01-05 DIAGNOSIS — E782 Mixed hyperlipidemia: Secondary | ICD-10-CM | POA: Diagnosis not present

## 2021-01-05 DIAGNOSIS — E118 Type 2 diabetes mellitus with unspecified complications: Secondary | ICD-10-CM | POA: Diagnosis not present

## 2021-01-05 DIAGNOSIS — R609 Edema, unspecified: Secondary | ICD-10-CM | POA: Diagnosis not present

## 2021-01-05 DIAGNOSIS — J439 Emphysema, unspecified: Secondary | ICD-10-CM | POA: Diagnosis not present

## 2021-01-05 DIAGNOSIS — Z6834 Body mass index (BMI) 34.0-34.9, adult: Secondary | ICD-10-CM | POA: Diagnosis not present

## 2021-01-05 DIAGNOSIS — R6889 Other general symptoms and signs: Secondary | ICD-10-CM | POA: Diagnosis not present

## 2021-01-05 DIAGNOSIS — K760 Fatty (change of) liver, not elsewhere classified: Secondary | ICD-10-CM | POA: Diagnosis not present

## 2021-01-05 DIAGNOSIS — Z1231 Encounter for screening mammogram for malignant neoplasm of breast: Secondary | ICD-10-CM | POA: Diagnosis not present

## 2021-01-05 DIAGNOSIS — G629 Polyneuropathy, unspecified: Secondary | ICD-10-CM | POA: Diagnosis not present

## 2021-01-06 ENCOUNTER — Other Ambulatory Visit: Payer: Self-pay | Admitting: Physician Assistant

## 2021-01-06 DIAGNOSIS — M810 Age-related osteoporosis without current pathological fracture: Secondary | ICD-10-CM

## 2021-01-06 DIAGNOSIS — Z1231 Encounter for screening mammogram for malignant neoplasm of breast: Secondary | ICD-10-CM

## 2021-02-02 DIAGNOSIS — R339 Retention of urine, unspecified: Secondary | ICD-10-CM | POA: Diagnosis not present

## 2021-02-02 DIAGNOSIS — R351 Nocturia: Secondary | ICD-10-CM | POA: Diagnosis not present

## 2021-02-02 DIAGNOSIS — R32 Unspecified urinary incontinence: Secondary | ICD-10-CM | POA: Diagnosis not present

## 2021-02-08 DIAGNOSIS — R3914 Feeling of incomplete bladder emptying: Secondary | ICD-10-CM | POA: Diagnosis not present

## 2021-02-08 DIAGNOSIS — N319 Neuromuscular dysfunction of bladder, unspecified: Secondary | ICD-10-CM | POA: Diagnosis not present

## 2021-02-10 ENCOUNTER — Other Ambulatory Visit (HOSPITAL_COMMUNITY): Payer: Self-pay | Admitting: Psychiatry

## 2021-02-10 DIAGNOSIS — F319 Bipolar disorder, unspecified: Secondary | ICD-10-CM

## 2021-02-28 DIAGNOSIS — R3914 Feeling of incomplete bladder emptying: Secondary | ICD-10-CM | POA: Diagnosis not present

## 2021-02-28 DIAGNOSIS — N3021 Other chronic cystitis with hematuria: Secondary | ICD-10-CM | POA: Diagnosis not present

## 2021-03-25 ENCOUNTER — Other Ambulatory Visit (HOSPITAL_COMMUNITY): Payer: Self-pay | Admitting: Psychiatry

## 2021-03-25 DIAGNOSIS — F419 Anxiety disorder, unspecified: Secondary | ICD-10-CM

## 2021-03-25 DIAGNOSIS — F319 Bipolar disorder, unspecified: Secondary | ICD-10-CM

## 2021-03-30 ENCOUNTER — Other Ambulatory Visit: Payer: Self-pay

## 2021-03-30 ENCOUNTER — Telehealth (HOSPITAL_COMMUNITY): Payer: Medicare HMO | Admitting: Psychiatry

## 2021-04-04 ENCOUNTER — Telehealth (HOSPITAL_COMMUNITY): Payer: Self-pay | Admitting: *Deleted

## 2021-04-04 DIAGNOSIS — N319 Neuromuscular dysfunction of bladder, unspecified: Secondary | ICD-10-CM | POA: Diagnosis not present

## 2021-04-04 NOTE — Telephone Encounter (Signed)
Pt called stating that she now has complete medication list. Pt emailed to Probation officer. List is in your box, then will be scanned to chart. FYI.

## 2021-04-04 NOTE — Telephone Encounter (Signed)
She needs to be seen in person. Can you schedule her any Thursday afternoon appointment.

## 2021-04-10 DIAGNOSIS — N3021 Other chronic cystitis with hematuria: Secondary | ICD-10-CM | POA: Diagnosis not present

## 2021-04-10 DIAGNOSIS — R3914 Feeling of incomplete bladder emptying: Secondary | ICD-10-CM | POA: Diagnosis not present

## 2021-04-18 DIAGNOSIS — M4722 Other spondylosis with radiculopathy, cervical region: Secondary | ICD-10-CM | POA: Diagnosis not present

## 2021-04-20 ENCOUNTER — Other Ambulatory Visit: Payer: Self-pay | Admitting: Neurosurgery

## 2021-04-20 DIAGNOSIS — G40A09 Absence epileptic syndrome, not intractable, without status epilepticus: Secondary | ICD-10-CM

## 2021-04-25 DIAGNOSIS — D692 Other nonthrombocytopenic purpura: Secondary | ICD-10-CM | POA: Diagnosis not present

## 2021-04-25 DIAGNOSIS — L570 Actinic keratosis: Secondary | ICD-10-CM | POA: Diagnosis not present

## 2021-04-25 DIAGNOSIS — L821 Other seborrheic keratosis: Secondary | ICD-10-CM | POA: Diagnosis not present

## 2021-04-25 DIAGNOSIS — L814 Other melanin hyperpigmentation: Secondary | ICD-10-CM | POA: Diagnosis not present

## 2021-04-25 DIAGNOSIS — Z85828 Personal history of other malignant neoplasm of skin: Secondary | ICD-10-CM | POA: Diagnosis not present

## 2021-04-25 DIAGNOSIS — L738 Other specified follicular disorders: Secondary | ICD-10-CM | POA: Diagnosis not present

## 2021-04-25 DIAGNOSIS — D225 Melanocytic nevi of trunk: Secondary | ICD-10-CM | POA: Diagnosis not present

## 2021-04-26 DIAGNOSIS — K760 Fatty (change of) liver, not elsewhere classified: Secondary | ICD-10-CM | POA: Diagnosis not present

## 2021-04-26 DIAGNOSIS — E782 Mixed hyperlipidemia: Secondary | ICD-10-CM | POA: Diagnosis not present

## 2021-04-26 DIAGNOSIS — R609 Edema, unspecified: Secondary | ICD-10-CM | POA: Diagnosis not present

## 2021-04-26 DIAGNOSIS — E118 Type 2 diabetes mellitus with unspecified complications: Secondary | ICD-10-CM | POA: Diagnosis not present

## 2021-04-26 DIAGNOSIS — G629 Polyneuropathy, unspecified: Secondary | ICD-10-CM | POA: Diagnosis not present

## 2021-04-26 DIAGNOSIS — M810 Age-related osteoporosis without current pathological fracture: Secondary | ICD-10-CM | POA: Diagnosis not present

## 2021-04-26 DIAGNOSIS — K219 Gastro-esophageal reflux disease without esophagitis: Secondary | ICD-10-CM | POA: Diagnosis not present

## 2021-04-26 DIAGNOSIS — J439 Emphysema, unspecified: Secondary | ICD-10-CM | POA: Diagnosis not present

## 2021-04-27 ENCOUNTER — Telehealth (INDEPENDENT_AMBULATORY_CARE_PROVIDER_SITE_OTHER): Payer: Medicare Other | Admitting: Psychiatry

## 2021-04-27 ENCOUNTER — Other Ambulatory Visit: Payer: Self-pay

## 2021-04-27 DIAGNOSIS — F419 Anxiety disorder, unspecified: Secondary | ICD-10-CM | POA: Diagnosis not present

## 2021-04-27 DIAGNOSIS — F319 Bipolar disorder, unspecified: Secondary | ICD-10-CM
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
## 2021-05-04 DIAGNOSIS — N3021 Other chronic cystitis with hematuria: Secondary | ICD-10-CM | POA: Diagnosis not present

## 2021-05-04 DIAGNOSIS — R3914 Feeling of incomplete bladder emptying: Secondary | ICD-10-CM | POA: Diagnosis not present

## 2021-05-10 DIAGNOSIS — G35 Multiple sclerosis: Secondary | ICD-10-CM | POA: Diagnosis not present

## 2021-05-10 DIAGNOSIS — G809 Cerebral palsy, unspecified: Secondary | ICD-10-CM | POA: Diagnosis not present

## 2021-05-12 ENCOUNTER — Other Ambulatory Visit: Payer: Self-pay

## 2021-05-12 ENCOUNTER — Ambulatory Visit
Admission: RE | Admit: 2021-05-12 | Discharge: 2021-05-12 | Disposition: A | Discharge status: Home / Self Care | Payer: Medicare Other | Source: Ambulatory Visit | Attending: Neurosurgery | Admitting: Neurosurgery

## 2021-05-12 DIAGNOSIS — R569 Unspecified convulsions: Secondary | ICD-10-CM | POA: Diagnosis not present

## 2021-05-12 DIAGNOSIS — G40A09 Absence epileptic syndrome, not intractable, without status epilepticus: Secondary | ICD-10-CM

## 2021-05-12 DIAGNOSIS — H538 Other visual disturbances: Secondary | ICD-10-CM | POA: Diagnosis not present

## 2021-05-12 DIAGNOSIS — R519 Headache, unspecified: Secondary | ICD-10-CM | POA: Diagnosis not present

## 2021-05-22 ENCOUNTER — Telehealth (HOSPITAL_COMMUNITY): Payer: Self-pay

## 2021-05-22 NOTE — Telephone Encounter (Signed)
Patient stopped in regarding her medications. She's out of all of them. Buspirone '10mg'$  #180 0 refills 1 tab po BID, Lamotrigine '150mg'$  #90 0 refills 1 tab po qd, and Amitriptyline '100mg'$  1 tab po qhs #90 0 refills were all sent in on 12/28/20. Patient has followup scheduled for 06/20/21. Patient is out doing errands and wants to pick them up today. Therapist, nutritional do a verbal order on her medications or do you want them to be escribed? Please review and advise. Thank you

## 2021-05-23 NOTE — Telephone Encounter (Signed)
Please see my last note and called her a 30-day supply until her next appointment.  She must see in person for next visit.  Please call her PCP to get recent blood work results and current list of medication.

## 2021-05-25 NOTE — Telephone Encounter (Signed)
Fax received from Dr. Marylyn Ishih[facility].  Patient may have absence seizures and they ordered a CT head.

## 2021-05-26 ENCOUNTER — Other Ambulatory Visit (HOSPITAL_COMMUNITY): Payer: Self-pay

## 2021-05-26 DIAGNOSIS — F419 Anxiety disorder, unspecified: Secondary | ICD-10-CM

## 2021-05-26 DIAGNOSIS — F319 Bipolar disorder, unspecified: Secondary | ICD-10-CM

## 2021-05-26 MED ORDER — LAMOTRIGINE 150 MG PO TABS: ORAL_TABLET | ORAL | 0 refills | Status: DC

## 2021-05-26 MED ORDER — BUSPIRONE HCL 10 MG PO TABS: ORAL_TABLET | ORAL | 0 refills | Status: DC

## 2021-05-26 MED ORDER — AMITRIPTYLINE HCL 100 MG PO TABS: 100.0000 mg | ORAL_TABLET | Freq: Every day | ORAL | 0 refills | Status: DC

## 2021-06-01 ENCOUNTER — Telehealth (HOSPITAL_COMMUNITY): Payer: Self-pay

## 2021-06-01 DIAGNOSIS — R3914 Feeling of incomplete bladder emptying: Secondary | ICD-10-CM | POA: Diagnosis not present

## 2021-06-01 DIAGNOSIS — N3021 Other chronic cystitis with hematuria: Secondary | ICD-10-CM | POA: Diagnosis not present

## 2021-06-01 NOTE — Telephone Encounter (Signed)
[place] to get bloodwork results and medications as requested. Writer put them in your box

## 2021-06-20 ENCOUNTER — Encounter (HOSPITAL_COMMUNITY): Payer: Self-pay | Admitting: Psychiatry

## 2021-06-20 ENCOUNTER — Other Ambulatory Visit: Payer: Self-pay

## 2021-06-20 ENCOUNTER — Telehealth (INDEPENDENT_AMBULATORY_CARE_PROVIDER_SITE_OTHER): Payer: Medicare Other | Admitting: Psychiatry

## 2021-06-20 DIAGNOSIS — F419 Anxiety disorder, unspecified: Secondary | ICD-10-CM | POA: Diagnosis not present

## 2021-06-20 DIAGNOSIS — F319 Bipolar disorder, unspecified: Secondary | ICD-10-CM | POA: Diagnosis not present

## 2021-06-20 MED ORDER — AMITRIPTYLINE HCL 100 MG PO TABS: 100.0000 mg | ORAL_TABLET | Freq: Every day | ORAL | 0 refills | Status: DC

## 2021-06-20 MED ORDER — LAMOTRIGINE 150 MG PO TABS: ORAL_TABLET | ORAL | 0 refills | Status: DC

## 2021-06-20 MED ORDER — BUSPIRONE HCL 10 MG PO TABS: ORAL_TABLET | ORAL | 0 refills | Status: DC
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
## 2021-06-22 NOTE — Progress Notes (Signed)
NEUROLOGY FOLLOW UP OFFICE NOTE  [name] [id number]  Assessment/Plan:   Recurrent spells of decreased attention - will evaluate for seizures. Given associated palpitations, consider cardiac etiology as well.  MRI of brain with and without contrast EEG.  If unremarkable, plan for 72 hour ambulatory EEG Follow up after testing.  Further recommendations pending results.   Subjective:  [name] is a 65 year old right-handed female with cerebral palsy, acromegaly, cervical spinal stenosis s/p ACDF, s/p bilateral carpal tunnel surgery, Bipolar 1 disorder, and COPD whom I previously saw for headache follows up for possible seizures.   UPDATE: She reports that she would sometimes zone out at school.  She just thought it was ADD.  Over the years, she would continue to have similar mild brief spells of inattention.  She also will sometimes have spells in which she seems unresponsive and her eyes close and head falls to her chest, like she has nodded off.  Lasts a few minutes.  No convulsions, facial droop, drooling, or incontinence.  It lasts a few minutes.  It can occur while she is moving on her scooter.  It occurs at least once a week.  She does feel that her heart is racing, not sure if it occurs before, during or after the event.  Over the past year, these spells have been more noticeable to her family.  She does not drive.  CT Head on 05/12/2021 personally reviewed showed remote left orbital blowout fracture but brain normal.  Current anti-anxiolytic:  BuSpar Current sleep aide:  melatonin Current Antidepressant medications:  Amitriptyline 136m Current Anticonvulsant medications:  Lamotrigine 1582mdaily  MRI/MRA of brain from 03/10/13 personally reviewed and demonstrated tiny nonspecific pervintricular and subcortical white matter changes.  Repeat MRI of brain from 09/08/14 personally reviewed and stable.  For cervical and occipital pain, she had an MRI of the cervical spine on  02/11/15, which showed multilevel left-sided foraminal stenosis, including at C6-7, as well as bilaterally at C5-6, and with moderate central canal stenosis at C5-6.  For cervical radiculitis she is treated by her orthopedist/pain specialist.       Past NSAIDS:  naproxen, tramadol Past analgesics:  Tylenol, Excedrin Migraine Past abortive triptans:  none Past abortive ergotamine:  none Past muscle relaxants:  Flexeril; tizanidine 67m38mhree times daily (drowsiness) Past anti-emetic:  Promethazine, Zofran ODT 8mg40mst antihypertensive medications:  none Past antidepressant medications:  Zoloft Past anticonvulsant medications:  gabapentin Past anti-CGRP:  none Past vitamins/Herbal/Supplements:  none Past antihistamines/decongestants:  Benadryl Other past therapies:  none     Family history of headache:  sister  PAST MEDICAL HISTORY: 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HCC)[place] Anxiety    Arthritis    Bacterial infection    Benign paroxysmal positional vertigo 02/24/2013   Bipolar disorder (HCC)[place] Bladder infection    Cancer (HCC)[place] skin cancer   Cerebral palsy (HCC)    Colitis    COPD (chronic obstructive pulmonary disease) (HCC)    mild emphysema   Depression    bi polar   Diabetes mellitus, type II (HCC)[place] Dysplasia of cervix    GERD (gastroesophageal reflux disease)    Headache(784.0)    High cholesterol    History of chicken pox    History of measles    History of mumps    IBS (irritable bowel syndrome)    Kidney infection    Melanoma in situ (  [place])    Neck mass    Neuromuscular disorder ([place])    Ovarian cyst    Poor circulation    Stenosis, cervical spine 05/16   Trichomonas    UTI (urinary tract infection)    current   Yeast infection     MEDICATIONS: Current Outpatient Medications on File Prior to Visit  Medication Sig Dispense Refill   Acetylcarnitine HCl (ACETYL-L-CARNITINE HCL) POWD by Does not apply route.     albuterol (PROVENTIL  HFA;VENTOLIN HFA) 108 (90 Base) MCG/ACT inhaler Inhale 1-2 puffs into the lungs every 6 (six) hours as needed for wheezing or shortness of breath.     Alcohol Swabs (ALCOHOL PREP) 70 % PADS  (Patient not taking: Reported on 12/28/2020)     alendronate (FOSAMAX) 70 MG tablet Take 70 mg by mouth every Monday. Take with a full glass of water on an empty stomach.     amitriptyline (ELAVIL) 100 MG tablet Take 1 tablet (100 mg total) by mouth at bedtime. 90 tablet 0   amoxicillin (AMOXIL) 875 MG tablet Take 1 tablet (875 mg total) by mouth 2 (two) times daily. (Patient not taking: No sig reported) 14 tablet 0   AQUALANCE LANCETS 30G MISC      Ascorbic Acid (VITAMIN C) 500 MG CAPS Take 500 mg by mouth at bedtime.      atorvastatin (LIPITOR) 40 MG tablet Take 40 mg by mouth daily.     Blood Glucose Calibration (OT ULTRA/FASTTK CNTRL SOLN) SOLN      Blood Glucose Monitoring Suppl (ONE TOUCH ULTRA 2) w/Device KIT      busPIRone (BUSPAR) 10 MG tablet TAKE 1 TABLET(10 MG) BY MOUTH TWICE DAILY 180 tablet 0   Calcium Carb-Cholecalciferol (CALCIUM 600+D3 PO) Take 1 tablet by mouth daily.     cyanocobalamin 100 MCG tablet Take by mouth.     esomeprazole (NEXIUM) 40 MG capsule TK ONE C PO D FOR ANTIACID     fluticasone (FLONASE) 50 MCG/ACT nasal spray Place 1 spray into both nostrils 2 (two) times daily.   1   furosemide (LASIX) 40 MG tablet Take 1 tablet by mouth daily.     glipiZIDE (GLUCOTROL XL) 10 MG 24 hr tablet TK 1 T PO QAM FOR DIABETES     hydrocortisone 1 % ointment Apply 1 application topically 2 (two) times daily. 30 g 0   lamoTRIgine (LAMICTAL) 150 MG tablet TAKE 1 TABLET(150 MG) BY MOUTH DAILY 90 tablet 0   Lancet Devices (ADJUSTABLE LANCING DEVICE) MISC      Lysine 500 MG CAPS Take by mouth. (Patient not taking: Reported on 04/11/2020)     Melatonin 10 MG TABS Take 10 mg by mouth at bedtime.     metFORMIN (GLUCOPHAGE-XR) 500 MG 24 hr tablet Take 500 mg by mouth daily with breakfast. IN THE MORNING.      methenamine (HIPREX) 1 g tablet Take 1 g by mouth at bedtime.      methocarbamol (ROBAXIN) 500 MG tablet Take 1 tablet (500 mg total) by mouth 2 (two) times daily as needed. 20 tablet 0   ONE TOUCH ULTRA TEST test strip      Polyethyl Glycol-Propyl Glycol (LUBRICANT EYE DROPS) 0.4-0.3 % SOLN Place 1-2 drops into both eyes 3 (three) times daily as needed (for dry eyes.).     potassium chloride (KLOR-CON) 10 MEQ tablet Take 10 mEq by mouth every morning.     Propylene Glycol-Glycerin 1-0.3 % SOLN Apply 1 drop to eye in  the morning and at bedtime.     ranitidine (ZANTAC) 300 MG tablet Take 300 mg by mouth daily.     risedronate (ACTONEL) 150 MG tablet Take 150 mg by mouth every morning.     spironolactone (ALDACTONE) 100 MG tablet Take 100 mg by mouth daily.   5   tamsulosin (FLOMAX) 0.4 MG CAPS capsule Take 0.4 mg by mouth at bedtime.  11   No current facility-administered medications on file prior to visit.    ALLERGIES: Allergies  Allergen Reactions   Miconazole Nitrate Hives and Itching   Sulfonamide Derivatives Hives and Itching    FAMILY HISTORY: Family History  Problem Relation Age of Onset   Suicidality Father    Depression Father    Alcohol abuse Father    Emphysema Father        smoked   Drug abuse Brother    Alcohol abuse Brother    Esophageal cancer Brother    Depression Maternal Aunt    Alcohol abuse Brother    Anxiety disorder Daughter    Depression Daughter    Suicidality Daughter    Alcohol abuse Daughter    Drug abuse Son    Alcohol abuse Son    Emphysema Sister        smoked      Objective:  Blood pressure 112/74, pulse 74, resp. rate 18, height _0  (1.727 m), SpO2 98 %. General: No acute distress.  Patient appears well-groomed.   Head:  Normocephalic/atraumatic Eyes:  Fundi examined but not visualized Neck: supple, no paraspinal tenderness, full range of motion Heart:  Regular rate and rhythm Lungs:  Clear to auscultation bilaterally Back: No  paraspinal tenderness Neurological Exam: alert and oriented to person, place, and time.  Speech fluent and not dysarthric, language intact.  CN II-XII intact. Bulk and tone normal, muscle strength 5/5 upper extremities, 2/5 bilateral hip flexion, otherwise 3/5 lower extremities.  Sensation to light touch intact.  Deep tendon reflexes 2+ throughout, toes downgoing.  Finger to nose testing intact.  Nonambulatory.       Metta Clines, DO  CC: Jenean Lindau, MD

## 2021-06-23 ENCOUNTER — Encounter: Payer: Self-pay | Admitting: Neurology

## 2021-06-23 ENCOUNTER — Ambulatory Visit (INDEPENDENT_AMBULATORY_CARE_PROVIDER_SITE_OTHER): Payer: Medicare Other | Admitting: Neurology

## 2021-06-23 ENCOUNTER — Other Ambulatory Visit: Payer: Self-pay

## 2021-06-23 VITALS — BP 112/74 | HR 74 | Resp 18 | Ht 68.0 in

## 2021-06-23 DIAGNOSIS — R6889 Other general symptoms and signs: Secondary | ICD-10-CM

## 2021-06-23 NOTE — Patient Instructions (Addendum)
Check MRI of brain with and without contrast Check routine EEG.  If unremarkable, plan for 72 hour ambulatory EEG Follow up after testing.  Further recommendations pending results We have sent a referral to [facility] for your MRI and they will call you directly to schedule your appointment. They are located at [place]. If you need to contact them directly please call [phone].

## 2021-06-26 ENCOUNTER — Other Ambulatory Visit: Payer: Self-pay

## 2021-06-27 DIAGNOSIS — N390 Urinary tract infection, site not specified: Secondary | ICD-10-CM | POA: Diagnosis not present

## 2021-06-29 DIAGNOSIS — N3021 Other chronic cystitis with hematuria: Secondary | ICD-10-CM | POA: Diagnosis not present

## 2021-06-29 DIAGNOSIS — R3914 Feeling of incomplete bladder emptying: Secondary | ICD-10-CM | POA: Diagnosis not present

## 2021-07-20 ENCOUNTER — Other Ambulatory Visit: Payer: Self-pay

## 2021-07-20 ENCOUNTER — Ambulatory Visit (INDEPENDENT_AMBULATORY_CARE_PROVIDER_SITE_OTHER): Payer: Medicare Other | Admitting: Neurology

## 2021-07-20 DIAGNOSIS — R6889 Other general symptoms and signs: Secondary | ICD-10-CM

## 2021-07-21 ENCOUNTER — Telehealth: Payer: Self-pay

## 2021-07-21 DIAGNOSIS — N319 Neuromuscular dysfunction of bladder, unspecified: Secondary | ICD-10-CM | POA: Diagnosis not present

## 2021-07-21 DIAGNOSIS — R6889 Other general symptoms and signs: Secondary | ICD-10-CM

## 2021-07-21 DIAGNOSIS — N3020 Other chronic cystitis without hematuria: Secondary | ICD-10-CM | POA: Diagnosis not present

## 2021-07-21 NOTE — Telephone Encounter (Signed)
Tried call pt, no answer. LMOVM to call the office back.

## 2021-07-21 NOTE — Telephone Encounter (Signed)
-----   Message from Pieter Partridge, DO sent at 07/21/2021  7:06 AM EDT ----- EEG normal.  Proceed with 72 hour ambulatory eeg

## 2021-07-21 NOTE — Procedures (Signed)
ELECTROENCEPHALOGRAM REPORT  Date of Study: 07/20/2021  Patient's Name: [name] MRN: [id number] Date of Birth: [birth year]   Clinical History: 65 year old female with cerebral palsy, acromegaly, cervical spinal stenosis s/p ACDF, s/p bilateral carpal tunnel surgery, Bipolar 1 disorder, and COPD who presents for staring spells.  Medications: Lamotrigine Amitriptyline Buspirone Methenamine Melatonin Risedronate Spironolactone Metformin Glipizide Atorvastatin Furosemide   Technical Summary: A multichannel digital EEG recording measured by the international 10-20 system with electrodes applied with paste and impedances below 5000 ohms performed in our laboratory with EKG monitoring in an awake and drowsy patient.  Photic stimulation was performed.  The digital EEG was referentially recorded, reformatted, and digitally filtered in a variety of bipolar and referential montages for optimal display.    Description: The patient is awake and drowsy during the recording.  During maximal wakefulness, there is a symmetric, medium voltage 11 Hz posterior dominant rhythm that attenuates with eye opening.  The record is symmetric.  During drowsiness, there is an increase in theta slowing of the background. Stage 2 sleep was not seen.  Photic stimulation did not elicit any abnormalities.  There were no epileptiform discharges or electrographic seizures seen.    EKG lead was unremarkable.  Impression: This awake and drowsy EEG is normal.    Clinical Correlation: A normal EEG does not exclude a clinical diagnosis of epilepsy.  If further clinical questions remain, prolonged EEG may be helpful.  Clinical correlation is advised.   Metta Clines, DO

## 2021-07-26 NOTE — Telephone Encounter (Signed)
Patient advised of her EEG results. Advised Ambulatory EEG 72 hour ordered [name] will call the patient.

## 2021-07-28 DIAGNOSIS — K760 Fatty (change of) liver, not elsewhere classified: Secondary | ICD-10-CM | POA: Diagnosis not present

## 2021-07-28 DIAGNOSIS — G629 Polyneuropathy, unspecified: Secondary | ICD-10-CM | POA: Diagnosis not present

## 2021-07-28 DIAGNOSIS — E782 Mixed hyperlipidemia: Secondary | ICD-10-CM | POA: Diagnosis not present

## 2021-07-28 DIAGNOSIS — M810 Age-related osteoporosis without current pathological fracture: Secondary | ICD-10-CM | POA: Diagnosis not present

## 2021-07-28 DIAGNOSIS — J439 Emphysema, unspecified: Secondary | ICD-10-CM | POA: Diagnosis not present

## 2021-07-28 DIAGNOSIS — E118 Type 2 diabetes mellitus with unspecified complications: Secondary | ICD-10-CM | POA: Diagnosis not present

## 2021-07-28 DIAGNOSIS — Z23 Encounter for immunization: Secondary | ICD-10-CM | POA: Diagnosis not present

## 2021-07-28 DIAGNOSIS — R609 Edema, unspecified: Secondary | ICD-10-CM | POA: Diagnosis not present

## 2021-07-31 DIAGNOSIS — N319 Neuromuscular dysfunction of bladder, unspecified: Secondary | ICD-10-CM | POA: Diagnosis not present

## 2021-07-31 DIAGNOSIS — R3914 Feeling of incomplete bladder emptying: Secondary | ICD-10-CM | POA: Diagnosis not present

## 2021-08-04 ENCOUNTER — Other Ambulatory Visit: Payer: Self-pay

## 2021-08-04 ENCOUNTER — Ambulatory Visit (INDEPENDENT_AMBULATORY_CARE_PROVIDER_SITE_OTHER): Payer: Medicare Other | Admitting: Neurology

## 2021-08-04 DIAGNOSIS — R6889 Other general symptoms and signs: Secondary | ICD-10-CM | POA: Diagnosis not present

## 2021-08-08 NOTE — Progress Notes (Signed)
ELECTROENCEPHALOGRAM REPORT  Dates of Recording: 08/04/2021 at 12:55 PM to 08/07/2021 at 8:31 AM  Patient's Name: [name] MRN: [id number] Date of Birth: December 07, 1955  Referring Provider: Jenean Lindau, MD  Procedure: 64-hour and 16 minute ambulatory EEG  History: 65 year old female with history of cerebral palsy and Bipolar disorder who presents for recurrent spells of inattention since childhood  Medications: amitriptyline, lamotrigine,methocarbamol  Technical Summary: This is a 64-hour and 16 minute multichannel digital EEG recording measured by the international 10-20 system with electrodes applied with paste and impedances below 5000 ohms performed as portable with EKG monitoring.  The digital EEG was referentially recorded, reformatted, and digitally filtered in a variety of bipolar and referential montages for optimal display.    DESCRIPTION OF RECORDING: During maximal wakefulness, the background activity consisted of a symmetric 10Hz  posterior dominant rhythm which was reactive to eye opening.  There were no epileptiform discharges or focal slowing seen in wakefulness.  During the recording, the patient progresses through wakefulness, drowsiness, and Stage 2 sleep.  Again, there were no epileptiform discharges seen.  Events:  There were no electrographic seizures seen.  EKG lead was unremarkable.  IMPRESSION: This 64-hour and 16 minute ambulatory EEG study is normal.    CLINICAL CORRELATION: A normal EEG does not exclude a clinical diagnosis of epilepsy.  If further clinical questions remain, inpatient video EEG monitoring may be helpful.   Metta Clines, DO

## 2021-08-09 ENCOUNTER — Telehealth: Payer: Self-pay

## 2021-08-09 NOTE — Telephone Encounter (Signed)
Pt advised pt follow up with her PCP.

## 2021-08-09 NOTE — Telephone Encounter (Signed)
Telephone call to pt, advised of EEG results.  Pt wanted to know what the next steps will be.  Please advise.

## 2021-08-09 NOTE — Telephone Encounter (Signed)
-----   Message from Pieter Partridge, DO sent at 08/08/2021  4:10 PM EDT ----- EEG is normal

## 2021-08-11 ENCOUNTER — Ambulatory Visit: Payer: Medicare Other

## 2021-08-15 ENCOUNTER — Ambulatory Visit: Payer: Medicare Other

## 2021-08-16 DIAGNOSIS — N3021 Other chronic cystitis with hematuria: Secondary | ICD-10-CM | POA: Diagnosis not present

## 2021-08-16 DIAGNOSIS — R3914 Feeling of incomplete bladder emptying: Secondary | ICD-10-CM | POA: Diagnosis not present

## 2021-08-17 DIAGNOSIS — N3021 Other chronic cystitis with hematuria: Secondary | ICD-10-CM | POA: Diagnosis not present

## 2021-08-17 DIAGNOSIS — R3914 Feeling of incomplete bladder emptying: Secondary | ICD-10-CM | POA: Diagnosis not present

## 2021-08-21 ENCOUNTER — Other Ambulatory Visit (HOSPITAL_COMMUNITY): Payer: Self-pay | Admitting: Psychiatry

## 2021-08-21 DIAGNOSIS — F319 Bipolar disorder, unspecified: Secondary | ICD-10-CM

## 2021-08-21 DIAGNOSIS — F419 Anxiety disorder, unspecified: Secondary | ICD-10-CM

## 2021-08-23 ENCOUNTER — Ambulatory Visit: Payer: Medicare Other

## 2021-08-25 ENCOUNTER — Other Ambulatory Visit (HOSPITAL_COMMUNITY): Payer: Self-pay | Admitting: Psychiatry

## 2021-08-25 DIAGNOSIS — F419 Anxiety disorder, unspecified: Secondary | ICD-10-CM

## 2021-08-25 DIAGNOSIS — F319 Bipolar disorder, unspecified: Secondary | ICD-10-CM

## 2021-09-08 DIAGNOSIS — G35 Multiple sclerosis: Secondary | ICD-10-CM | POA: Diagnosis not present

## 2021-09-08 DIAGNOSIS — G809 Cerebral palsy, unspecified: Secondary | ICD-10-CM | POA: Diagnosis not present

## 2021-09-14 ENCOUNTER — Ambulatory Visit (HOSPITAL_BASED_OUTPATIENT_CLINIC_OR_DEPARTMENT_OTHER): Payer: Medicare Other | Admitting: Psychiatry

## 2021-09-14 ENCOUNTER — Other Ambulatory Visit: Payer: Self-pay

## 2021-09-14 ENCOUNTER — Encounter (HOSPITAL_COMMUNITY): Payer: Self-pay | Admitting: Psychiatry

## 2021-09-14 DIAGNOSIS — F319 Bipolar disorder, unspecified: Secondary | ICD-10-CM | POA: Diagnosis not present

## 2021-09-14 DIAGNOSIS — F419 Anxiety disorder, unspecified: Secondary | ICD-10-CM

## 2021-09-14 MED ORDER — AMITRIPTYLINE HCL 100 MG PO TABS: 100.0000 mg | ORAL_TABLET | Freq: Every day | ORAL | 0 refills | Status: DC

## 2021-09-14 MED ORDER — BUSPIRONE HCL 10 MG PO TABS: ORAL_TABLET | ORAL | 0 refills | Status: DC

## 2021-09-14 MED ORDER — LAMOTRIGINE 150 MG PO TABS: ORAL_TABLET | ORAL | 0 refills | Status: DC
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
## 2021-09-20 ENCOUNTER — Ambulatory Visit: Payer: Medicare Other | Admitting: Neurology

## 2021-10-02 ENCOUNTER — Other Ambulatory Visit: Payer: Self-pay

## 2021-10-02 ENCOUNTER — Ambulatory Visit (INDEPENDENT_AMBULATORY_CARE_PROVIDER_SITE_OTHER): Payer: Medicare Other | Admitting: Podiatry

## 2021-10-02 DIAGNOSIS — M79675 Pain in left toe(s): Secondary | ICD-10-CM

## 2021-10-02 DIAGNOSIS — M79674 Pain in right toe(s): Secondary | ICD-10-CM

## 2021-10-02 DIAGNOSIS — B351 Tinea unguium: Secondary | ICD-10-CM | POA: Diagnosis not present

## 2021-10-02 DIAGNOSIS — I7300 Raynaud's syndrome without gangrene: Secondary | ICD-10-CM

## 2021-10-02 DIAGNOSIS — E1159 Type 2 diabetes mellitus with other circulatory complications: Secondary | ICD-10-CM | POA: Diagnosis not present

## 2021-10-02 NOTE — Progress Notes (Signed)
  Subjective:  Patient ID: [name], female    DOB: 01/23/1956,  MRN: [id number]  Chief Complaint  Patient presents with   Diabetes     NP [facility]  feet swollen  nails trimmed Jeoffrey Massed spot on R foot     65 y.o. female presents with the above complaint. History confirmed with patient.  Her feet are swollen and often feel cold.  She is a type II diabetic her A1c is 7.2%.  She is unable to trim her own nails and they are painful.  Objective:  Physical Exam: warm, good protective and good capillary refill, normal sensory exam, and unable to palpate pulses secondary to edema she does have cool temperature to the toes and there is pallor and cyanosis with delayed capillary fill time. Left Foot: dystrophic yellowed discolored nail plates with subungual debris Right Foot: dystrophic yellowed discolored nail plates with subungual debris  Assessment:   1. Pain due to onychomycosis of toenails of both feet   2. Raynaud's phenomenon without gangrene   3. Type 2 diabetes mellitus with other circulatory complication, without long-term current use of insulin ([place])      Plan:  Patient was evaluated and treated and all questions answered.  Suspect her pulses are nonpalpable secondary to her edema she may have a component of Raynaud's.  I recommended noninvasive vascular testing to evaluate this and I sent her a referral for this at [facility]  Patient educated on diabetes. Discussed proper diabetic foot care and discussed risks and complications of disease. Educated patient in depth on reasons to return to the office immediately should he/she discover anything concerning or new on the feet. All questions answered. Discussed proper shoes as well.   Discussed the etiology and treatment options for the condition in detail with the patient. Educated patient on the topical and oral treatment options for mycotic nails. Recommended debridement of the nails today. Sharp and mechanical  debridement performed of all painful and mycotic nails today. Nails debrided in length and thickness using a nail nipper to level of comfort. Discussed treatment options including appropriate shoe gear. Follow up as needed for painful nails.    Return in about 3 months (around 12/31/2021) for at risk diabetic foot care.

## 2021-10-03 ENCOUNTER — Other Ambulatory Visit: Payer: Self-pay | Admitting: Podiatry

## 2021-10-03 DIAGNOSIS — I7300 Raynaud's syndrome without gangrene: Secondary | ICD-10-CM

## 2021-10-17 ENCOUNTER — Ambulatory Visit (HOSPITAL_COMMUNITY)
Admission: RE | Admit: 2021-10-17 | Payer: Medicare Other | Source: Ambulatory Visit | Attending: Podiatry | Admitting: Podiatry

## 2021-10-25 DIAGNOSIS — N3020 Other chronic cystitis without hematuria: Secondary | ICD-10-CM | POA: Diagnosis not present

## 2021-10-29 DIAGNOSIS — I2510 Atherosclerotic heart disease of native coronary artery without angina pectoris: Secondary | ICD-10-CM

## 2021-10-29 HISTORY — DX: Atherosclerotic heart disease of native coronary artery without angina pectoris: I25.10

## 2021-11-02 ENCOUNTER — Ambulatory Visit: Payer: Medicare Other

## 2021-11-02 DIAGNOSIS — J439 Emphysema, unspecified: Secondary | ICD-10-CM | POA: Diagnosis not present

## 2021-11-02 DIAGNOSIS — M810 Age-related osteoporosis without current pathological fracture: Secondary | ICD-10-CM | POA: Diagnosis not present

## 2021-11-02 DIAGNOSIS — R609 Edema, unspecified: Secondary | ICD-10-CM | POA: Diagnosis not present

## 2021-11-02 DIAGNOSIS — K219 Gastro-esophageal reflux disease without esophagitis: Secondary | ICD-10-CM | POA: Diagnosis not present

## 2021-11-02 DIAGNOSIS — E220 Acromegaly and pituitary gigantism: Secondary | ICD-10-CM | POA: Diagnosis not present

## 2021-11-02 DIAGNOSIS — Z79899 Other long term (current) drug therapy: Secondary | ICD-10-CM | POA: Diagnosis not present

## 2021-11-02 DIAGNOSIS — E118 Type 2 diabetes mellitus with unspecified complications: Secondary | ICD-10-CM | POA: Diagnosis not present

## 2021-11-02 DIAGNOSIS — K760 Fatty (change of) liver, not elsewhere classified: Secondary | ICD-10-CM | POA: Diagnosis not present

## 2021-11-02 DIAGNOSIS — E782 Mixed hyperlipidemia: Secondary | ICD-10-CM | POA: Diagnosis not present

## 2021-11-02 DIAGNOSIS — G629 Polyneuropathy, unspecified: Secondary | ICD-10-CM | POA: Diagnosis not present

## 2021-11-03 ENCOUNTER — Ambulatory Visit (HOSPITAL_COMMUNITY)
Admission: RE | Admit: 2021-11-03 | Payer: Commercial Managed Care - HMO | Source: Ambulatory Visit | Attending: Podiatry | Admitting: Podiatry

## 2021-11-03 DIAGNOSIS — R69 Illness, unspecified: Secondary | ICD-10-CM | POA: Diagnosis not present

## 2021-11-06 DIAGNOSIS — R3914 Feeling of incomplete bladder emptying: Secondary | ICD-10-CM | POA: Diagnosis not present

## 2021-11-06 DIAGNOSIS — N3021 Other chronic cystitis with hematuria: Secondary | ICD-10-CM | POA: Diagnosis not present

## 2021-11-09 DIAGNOSIS — H524 Presbyopia: Secondary | ICD-10-CM | POA: Diagnosis not present

## 2021-11-09 DIAGNOSIS — H5213 Myopia, bilateral: Secondary | ICD-10-CM | POA: Diagnosis not present

## 2021-11-09 DIAGNOSIS — H2513 Age-related nuclear cataract, bilateral: Secondary | ICD-10-CM | POA: Diagnosis not present

## 2021-11-09 DIAGNOSIS — E1136 Type 2 diabetes mellitus with diabetic cataract: Secondary | ICD-10-CM | POA: Diagnosis not present

## 2021-11-09 DIAGNOSIS — H52203 Unspecified astigmatism, bilateral: Secondary | ICD-10-CM | POA: Diagnosis not present

## 2021-11-10 ENCOUNTER — Ambulatory Visit (HOSPITAL_COMMUNITY)
Admission: RE | Admit: 2021-11-10 | Discharge: 2021-11-10 | Disposition: A | Discharge status: Home / Self Care | Payer: Medicare Other | Source: Ambulatory Visit | Attending: Cardiology | Admitting: Cardiology

## 2021-11-10 ENCOUNTER — Other Ambulatory Visit: Payer: Self-pay

## 2021-11-10 DIAGNOSIS — I7300 Raynaud's syndrome without gangrene: Secondary | ICD-10-CM | POA: Diagnosis not present

## 2021-11-14 DIAGNOSIS — R3914 Feeling of incomplete bladder emptying: Secondary | ICD-10-CM | POA: Diagnosis not present

## 2021-11-14 DIAGNOSIS — N319 Neuromuscular dysfunction of bladder, unspecified: Secondary | ICD-10-CM | POA: Diagnosis not present

## 2021-11-15 ENCOUNTER — Other Ambulatory Visit (HOSPITAL_COMMUNITY): Payer: Self-pay | Admitting: Psychiatry

## 2021-11-15 DIAGNOSIS — F419 Anxiety disorder, unspecified: Secondary | ICD-10-CM

## 2021-11-15 DIAGNOSIS — F319 Bipolar disorder, unspecified: Secondary | ICD-10-CM

## 2021-11-22 IMAGING — XA IR INJECT/[PERSON_NAME]/INC NEEDLE/CATH/PLC EPI/CERV/THOR W/IMG
5 series · 13 of 13 positions shown · non-contrast
Comparison: none

CLINICAL DATA: Left upper extremity radiculopathy. C5-6 fusion.
Adjacent level disease at C6-7 on the left. Displacement of the C6-7
cervical disc.

[Series 1: fl (-) angio · 2 of 2 slices shown (1 of 5)]
[im 1/2]
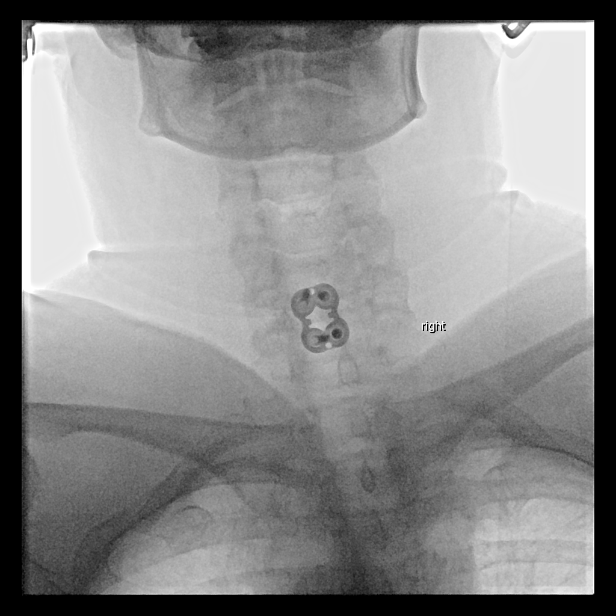
[im 2/2]
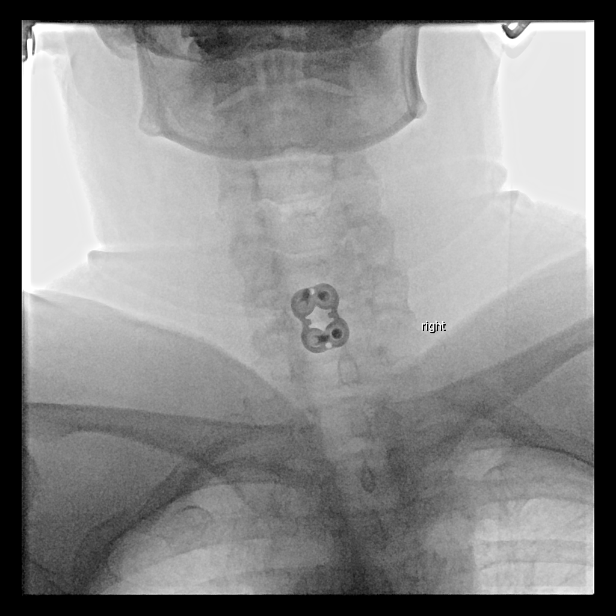

[Series 2: fl (-) angio · 2 of 2 slices shown (2 of 5)]
[im 1/2]
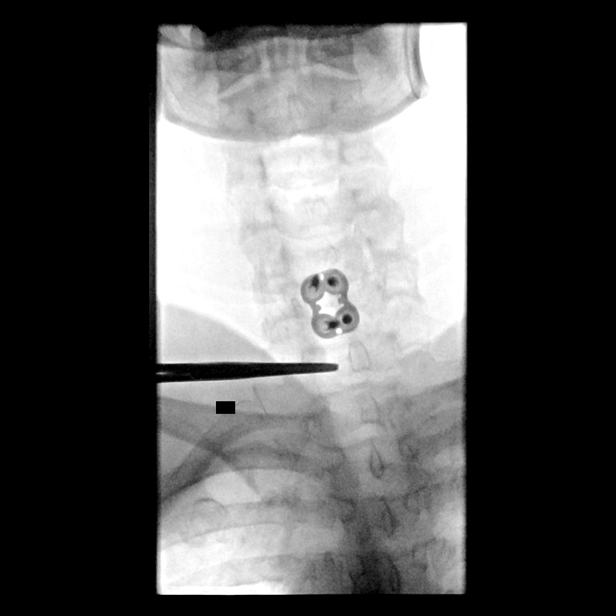
[im 2/2]
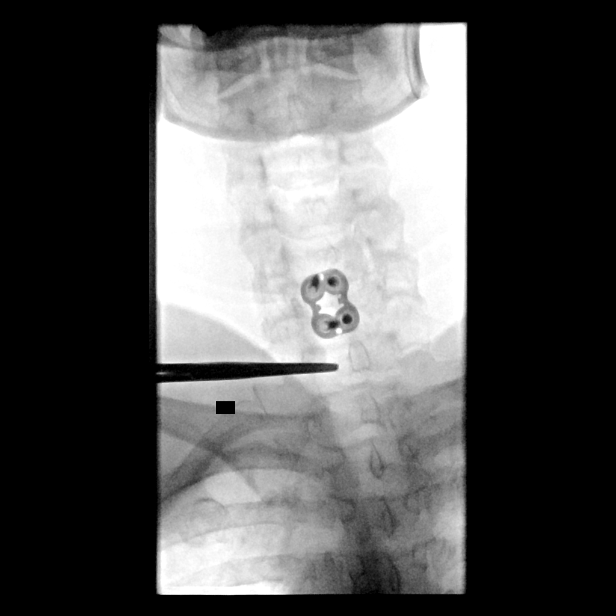

[Series 3: fl (-) angio · 2 of 2 slices shown (3 of 5)]
[im 1/2]
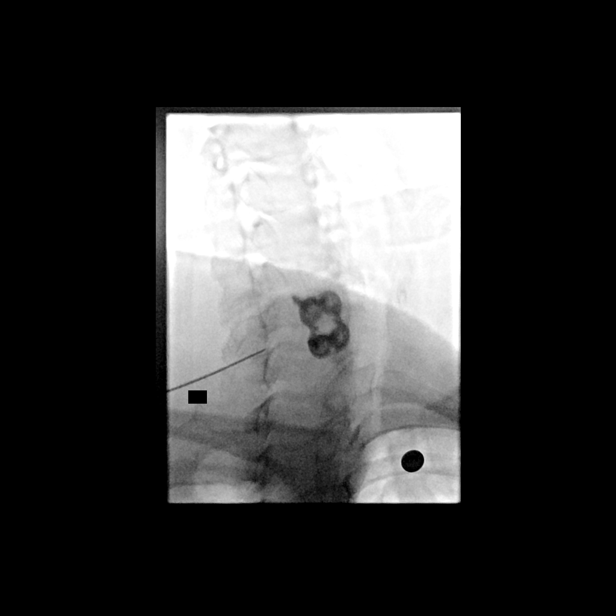
[im 2/2]
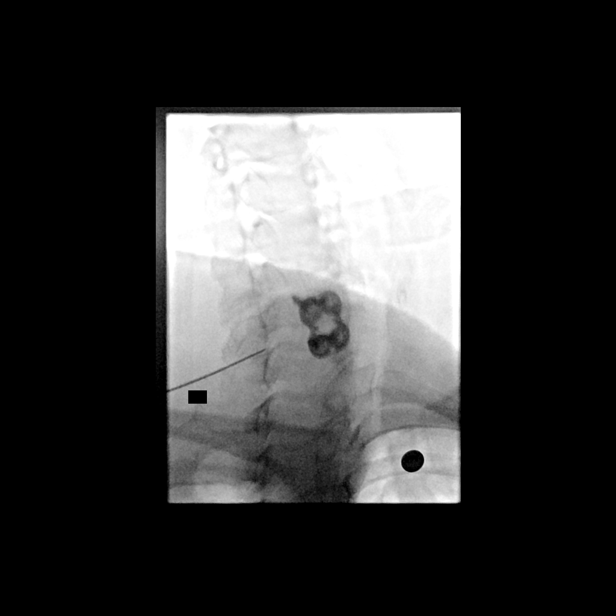

[Series 4: fl (-) angio · 2 of 2 slices shown (4 of 5)]
[im 1/2]
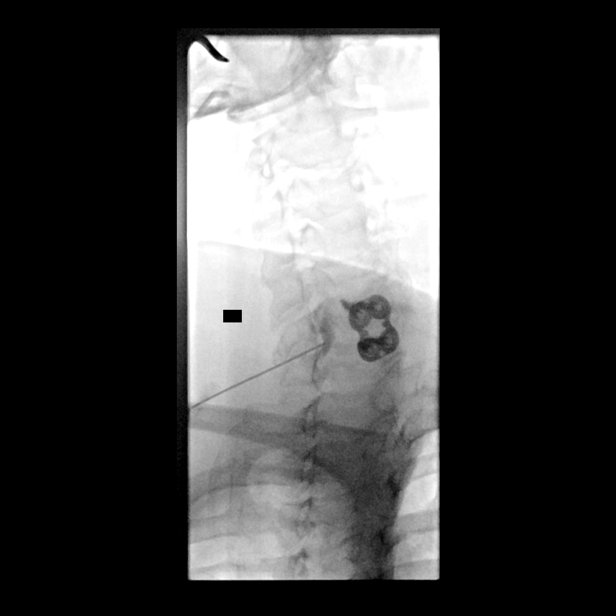
[im 2/2]
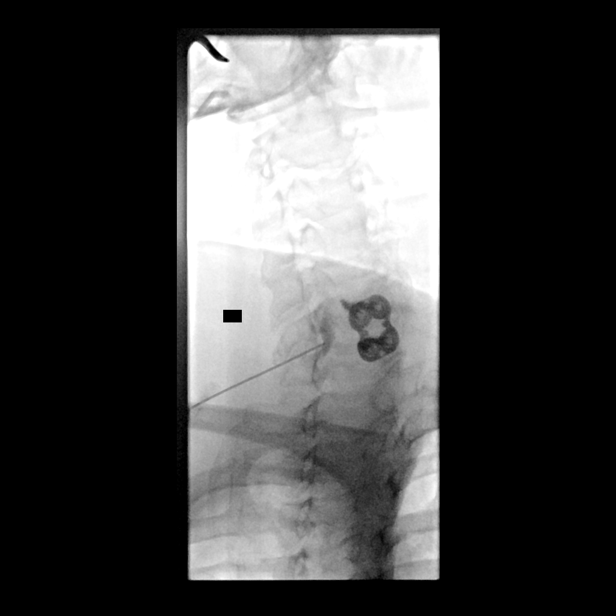

[Series 5: fl (-) angio · 5 of 5 slices shown (5 of 5)]
[im 1/5]
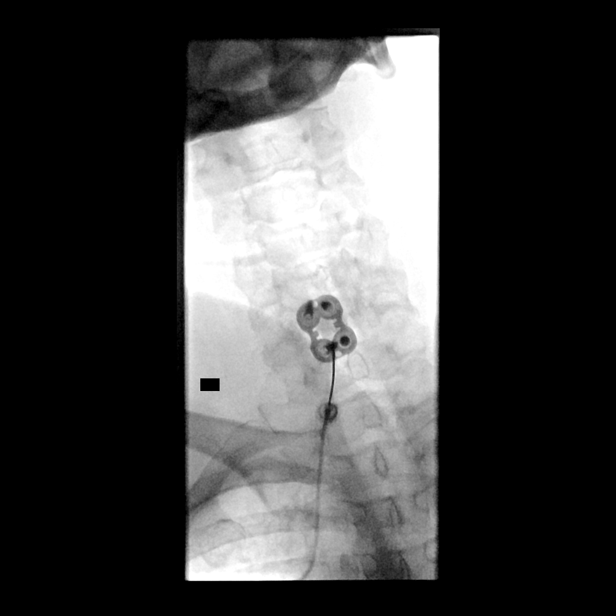
[im 2/5]
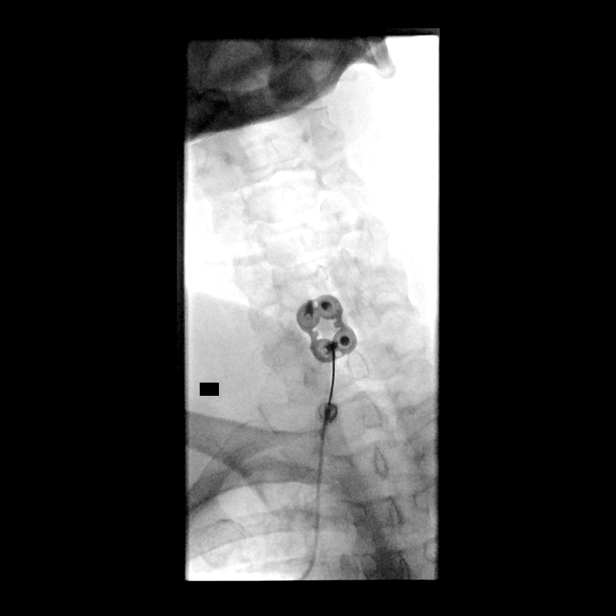
[im 3/5]
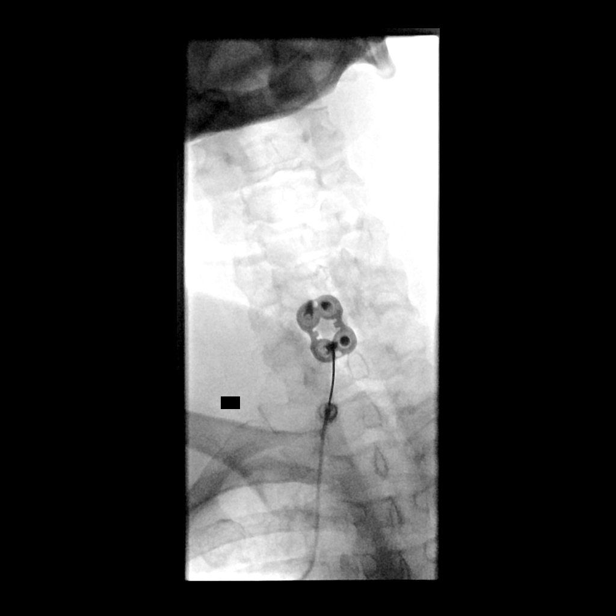
[im 4/5]
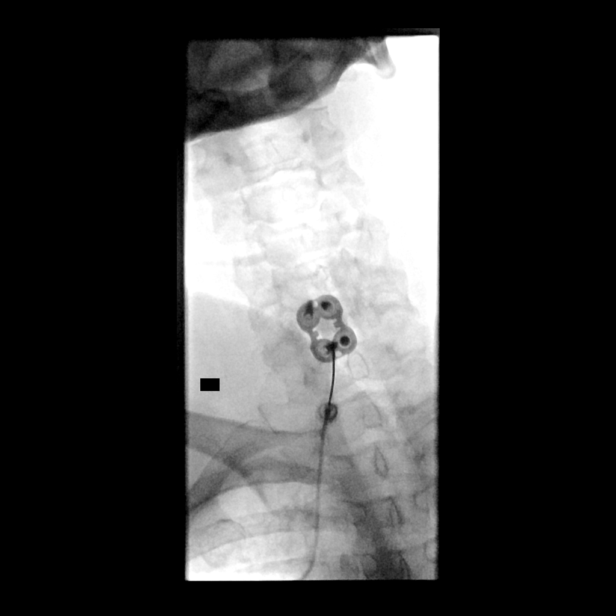
[im 5/5]
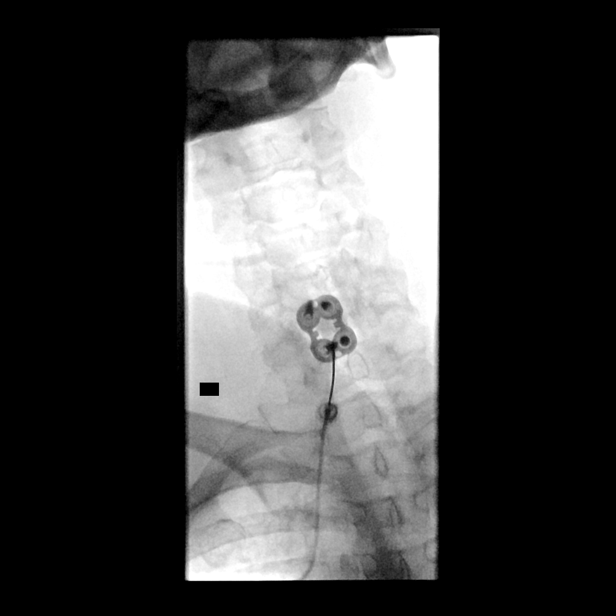

[13 of 13 positions shown; findings below may reference images not displayed]

FLUOROSCOPY TIME:  Radiation Exposure Index (as provided by the
fluoroscopic device): 2.08 uGy*m2

PROCEDURE:
CERVICAL EPIDURAL INJECTION

An interlaminar approach was performed on the left at C6-7. A 20
gauge epidural needle was advanced using loss-of-resistance
technique.

DIAGNOSTIC EPIDURAL INJECTION

Injection of Omnipaque 350 shows a good epidural pattern with spread
above and below the level of needle placement, primarily on the
left. No vascular opacification is seen. THERAPEUTIC

EPIDURAL INJECTION

1.5 ml of Kenalog 40 mixed with 1 ml of 1% Lidocaine and 2 ml of
normal saline were then instilled. The procedure was well-tolerated,
and the patient was discharged thirty minutes following the
injection in good condition.
IMPRESSION: Technically successful first epidural injection on the left at C6-7.

## 2021-11-23 DIAGNOSIS — R69 Illness, unspecified: Secondary | ICD-10-CM | POA: Diagnosis not present

## 2021-12-04 ENCOUNTER — Telehealth (HOSPITAL_COMMUNITY): Payer: Self-pay | Admitting: *Deleted

## 2021-12-04 NOTE — Telephone Encounter (Signed)
She can still see neurologist while waiting for her appointment on February 9.

## 2021-12-04 NOTE — Telephone Encounter (Signed)
Pt called stating that despite Lamictal being increased to 150 mg qd she is having seizures again. Pt states that she has "silent seizures". Writer advised pt make an appointment with neurology although pt says that she doesn't f/u with Dr. Tomi Likens anymore. Writer asked if we could make a referral to [facility] however pt prefers to wait to see you on 12/07/21.

## 2021-12-04 NOTE — Telephone Encounter (Signed)
Ok,I don't treat seizures. Will discuss on her appointment. If she had a seizures than she need to call 911.

## 2021-12-04 NOTE — Telephone Encounter (Signed)
I did advise that however pt does not want to and would not say why except that "I'm done with them". FYI.

## 2021-12-05 DIAGNOSIS — R3914 Feeling of incomplete bladder emptying: Secondary | ICD-10-CM | POA: Diagnosis not present

## 2021-12-05 DIAGNOSIS — N3021 Other chronic cystitis with hematuria: Secondary | ICD-10-CM | POA: Diagnosis not present

## 2021-12-07 ENCOUNTER — Other Ambulatory Visit (HOSPITAL_COMMUNITY): Payer: Self-pay | Admitting: *Deleted

## 2021-12-07 ENCOUNTER — Encounter (HOSPITAL_COMMUNITY): Payer: Self-pay | Admitting: Psychiatry

## 2021-12-07 ENCOUNTER — Other Ambulatory Visit: Payer: Self-pay

## 2021-12-07 ENCOUNTER — Telehealth (HOSPITAL_COMMUNITY): Payer: Self-pay | Admitting: *Deleted

## 2021-12-07 ENCOUNTER — Ambulatory Visit (HOSPITAL_BASED_OUTPATIENT_CLINIC_OR_DEPARTMENT_OTHER): Payer: Medicare Other | Admitting: Psychiatry

## 2021-12-07 DIAGNOSIS — F419 Anxiety disorder, unspecified: Secondary | ICD-10-CM

## 2021-12-07 DIAGNOSIS — F319 Bipolar disorder, unspecified: Secondary | ICD-10-CM

## 2021-12-07 DIAGNOSIS — Z79899 Other long term (current) drug therapy: Secondary | ICD-10-CM

## 2021-12-07 MED ORDER — BUSPIRONE HCL 10 MG PO TABS: ORAL_TABLET | ORAL | 0 refills | Status: DC

## 2021-12-07 MED ORDER — AMITRIPTYLINE HCL 100 MG PO TABS: 100.0000 mg | ORAL_TABLET | Freq: Every day | ORAL | 0 refills | Status: DC

## 2021-12-07 MED ORDER — LAMOTRIGINE 200 MG PO TABS: ORAL_TABLET | ORAL | 0 refills | Status: DC
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
## 2021-12-07 NOTE — Telephone Encounter (Signed)
VM left for pt advising that lab work has been ordered for pt and which [facility] would she prefer to o to. Pt lives in [place]. Pt encouraged to call us back at the main office number or nurse direct line. Will fax orders when pt returns call.

## 2021-12-08 ENCOUNTER — Other Ambulatory Visit (HOSPITAL_COMMUNITY): Payer: Self-pay | Admitting: *Deleted

## 2021-12-08 DIAGNOSIS — F319 Bipolar disorder, unspecified: Secondary | ICD-10-CM

## 2021-12-08 MED ORDER — LAMOTRIGINE 200 MG PO TABS: ORAL_TABLET | ORAL | 0 refills | Status: DC

## 2021-12-11 DIAGNOSIS — E220 Acromegaly and pituitary gigantism: Secondary | ICD-10-CM | POA: Diagnosis not present

## 2021-12-19 ENCOUNTER — Telehealth (HOSPITAL_COMMUNITY): Payer: Self-pay | Admitting: *Deleted

## 2021-12-19 NOTE — Telephone Encounter (Signed)
Received fax from [facility] regarding pharmacy is out of stock of Elavil 100 mg tablets. Pharmacy is recommending these covered alternatives: Remeron 20 mg tabs; Celexa 40 mg tabs; Wellbutrin 100 mg tabs. Pt next appointment scheduled for 03/08/22. Please review and advise.

## 2021-12-20 ENCOUNTER — Other Ambulatory Visit: Payer: Medicare Other

## 2021-12-20 ENCOUNTER — Telehealth (HOSPITAL_COMMUNITY): Payer: Self-pay | Admitting: *Deleted

## 2021-12-20 NOTE — Telephone Encounter (Signed)
Writer left VM for pt to call nurse back on advise which local pharmacy to send Elavil Rx as [facility] mail order is out of stock.

## 2021-12-20 NOTE — Telephone Encounter (Signed)
Yes of course. I will ask her preference and send.

## 2021-12-20 NOTE — Telephone Encounter (Signed)
I prefer Elavil as she is on for a while. Can she try a different pharmacy?

## 2021-12-21 ENCOUNTER — Other Ambulatory Visit (HOSPITAL_COMMUNITY): Payer: Self-pay | Admitting: *Deleted

## 2021-12-21 DIAGNOSIS — F419 Anxiety disorder, unspecified: Secondary | ICD-10-CM

## 2021-12-21 MED ORDER — AMITRIPTYLINE HCL 100 MG PO TABS: 100.0000 mg | ORAL_TABLET | Freq: Every day | ORAL | 2 refills | Status: DC

## 2021-12-25 ENCOUNTER — Encounter: Payer: Self-pay | Admitting: Physician Assistant

## 2021-12-25 ENCOUNTER — Ambulatory Visit (INDEPENDENT_AMBULATORY_CARE_PROVIDER_SITE_OTHER): Payer: Medicare Other | Admitting: Physician Assistant

## 2021-12-25 ENCOUNTER — Other Ambulatory Visit: Payer: Self-pay

## 2021-12-25 ENCOUNTER — Ambulatory Visit: Payer: Self-pay

## 2021-12-25 DIAGNOSIS — M542 Cervicalgia: Secondary | ICD-10-CM

## 2021-12-25 DIAGNOSIS — M7542 Impingement syndrome of left shoulder: Secondary | ICD-10-CM | POA: Diagnosis not present

## 2021-12-25 MED ORDER — METHYLPREDNISOLONE ACETATE 40 MG/ML IJ SUSP
40.0000 mg | INTRAMUSCULAR | Status: AC | PRN
  Administered 2021-12-25: 40 mg via INTRA_ARTICULAR

## 2021-12-25 MED ORDER — LIDOCAINE HCL 1 % IJ SOLN
5.0000 mL | INTRAMUSCULAR | Status: AC | PRN
  Administered 2021-12-25: 5 mL

## 2021-12-25 NOTE — Progress Notes (Signed)
Office Visit Note   Patient: [name]           Date of Birth: February 22, 1956           MRN: [id number] Visit Date: 12/25/2021              Requested by: Helen Hashimoto., MD [address] [place],  [place] [address] PCP: Helen Hashimoto., MD  Chief Complaint  Patient presents with   Neck - Pain      HPI: Patient is a pleasant 66 year old woman who presents today with left-sided shoulder and neck pain.  She is status post a cervical fusion at C5-6 a couple years ago.  She denies any numbing or tingling.  She has difficulty focusing on whether her pain comes from her shoulder or neck.  She does have 1 sore spot over the [facility] joint on her left shoulder.  This was x-rayed a couple years ago and showed some AC arthropathy.  She also said she fell 2 weeks ago and has a little knot on her right shoulder.  It is not very painful and is improving.  She uses a power scooter for ambulation  Assessment & Plan: Visit Diagnoses:  1. Neck pain     Plan: She does have a lot of arthritis at the C4-5 level above her fusion.  Certainly this could be the cause of her pain.  However she does have some tenderness over the [facility] joint.  We will go forward with an injection today if she does not get any relief in a week could consider referral for evaluation of her cervical spine.  She is continuing to improve from her fall and does not really have much pain in the right shoulder but certainly if this did not continue to improve I recommend evaluation and x-ray  Follow-Up Instructions: No follow-ups on file.   Ortho Exam  Patient is alert, oriented, no adenopathy, well-dressed, normal affect, normal respiratory effort. Examination of her left shoulder she is tender over the [facility] to palpation.  She has good forward elevation internal rotation behind her back excellent grip strength good abduction resisted external and internal rotation.  No palpable deformity in her spine.  Cannot really  appreciate any deformity in her right shoulder.  She has full range of motion of her right shoulder not painful mild impingement findings  Imaging: No results found. No images are attached to the encounter.  Labs: Lab Results  Component Value Date   HGBA1C 7.2 (H) 11/29/2017   HGBA1C 6.6 (H) 07/30/2017   HGBA1C 6.9 (H) 05/02/2016   REPTSTATUS 09/18/2020 FINAL 09/17/2020   CULT MULTIPLE SPECIES PRESENT, SUGGEST RECOLLECTION (A) 09/17/2020   LABORGA CITROBACTER FREUNDII 07/07/2013     Lab Results  Component Value Date   ALBUMIN 4.5 12/15/2015   ALBUMIN 3.9 11/18/2013   ALBUMIN 3.9 07/07/2013    Lab Results  Component Value Date   MG 1.9 07/08/2013   No results found for: VD25OH  No results found for: PREALBUMIN CBC EXTENDED Latest Ref Rng & Units 11/29/2017 07/30/2017 08/24/2016  WBC 4.0 - 10.5 K/uL - 10.6(H) -  RBC 3.87 - 5.11 MIL/uL - 5.02 -  HGB 12.0 - 15.0 g/dL 15.6(H) 13.7 16.0(H)  HCT 36.0 - 46.0 % - 43.2 47.0(H)  PLT 150 - 400 K/uL - 309 -  NEUTROABS 1.7 - 7.7 K/uL - - -  LYMPHSABS 0.7 - 4.0 K/uL - - -     There is  no height or weight on file to calculate BMI.  Orders:  Orders Placed This Encounter  Procedures   XR Cervical Spine 2 or 3 views   No orders of the defined types were placed in this encounter.    Procedures: Large Joint Inj: L subacromial bursa on 12/25/2021 2:15 PM Indications: diagnostic evaluation and pain Details: 25 G 1.5 in needle, posterior approach  Arthrogram: No  Medications: 5 mL lidocaine 1 %; 40 mg methylPREDNISolone acetate 40 MG/ML Outcome: tolerated well, no immediate complications Procedure, treatment alternatives, risks and benefits explained, specific risks discussed. Consent was given by the patient.     Clinical Data: No additional findings.  ROS:  All other systems negative, except as noted in the HPI. Review of Systems  Objective: Vital Signs: There were no vitals taken for this visit.  Specialty  Comments:  No specialty comments available.  PMFS History: Patient Active Problem List   Diagnosis Date Noted   Neck pain 11/21/2018   Low back pain 08/19/2018   Weakness of both lower extremities 08/19/2018   Pain in finger of left hand 05/28/2018   Pain in right hand 05/28/2018   Pain in left elbow 04/02/2018   Chronic right shoulder pain 07/16/2017   Rotator cuff syndrome, right 05/13/2017   Cervical radiculopathy 01/14/2017   Colon cancer screening    Encounter for screening for gastric cancer    Osteoarthritis of spine with radiculopathy, cervical region 05/25/2016   Postmenopausal bleeding 08/04/2015   Acute respiratory failure ([place]) 07/19/2013   Nocturnal hypoxemia 07/07/2013   UTI (lower urinary tract infection) 07/07/2013   Hypokalemia 07/07/2013   Abdominal pain, acute, right lower quadrant 07/07/2013   Benign paroxysmal positional vertigo 02/24/2013   Edema of both legs 02/24/2013   Dysplasia of cervix    Melanoma in situ [facility])    Bipolar 1 disorder ([place]) 02/08/2012   IBS 12/04/2007   RASH AND OTHER NONSPECIFIC SKIN ERUPTION 12/04/2007   EMPHYSEMA by CT only 09/05/2007   NECK MASS 09/05/2007   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[place]) 08/13/2007   Cerebral palsy ([place]) 08/13/2007   OTITIS EXTERNA, ACUTE 08/13/2007   IRRITABLE BOWEL SYNDROME, HX OF 08/13/2007   HYPERLIPIDEMIA 05/10/2007   DEPRESSION 05/10/2007   ALLERGIC RHINITIS 05/10/2007   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[place])    Anxiety    Arthritis    Bacterial infection    Benign paroxysmal positional vertigo 02/24/2013   Bipolar disorder ([place])    Bladder infection    Cancer ([place])    skin cancer   Cerebral palsy ([facility])    Colitis    COPD (chronic obstructive pulmonary disease) ([place])    mild emphysema   Depression    bi polar   Diabetes mellitus, type II ([place])    Dysplasia of cervix    GERD (gastroesophageal reflux disease)    Headache(784.0)    High cholesterol    History of chicken pox     History of measles    History of mumps    IBS (irritable bowel syndrome)    Kidney infection    Melanoma in situ ([place])    Neck mass    Neuromuscular disorder ([place])    Ovarian cyst    Poor circulation    Stenosis, cervical spine 05/16   Trichomonas    UTI (urinary tract infection)    current   Yeast infection     Family History  Problem Relation Age of Onset   Suicidality Father  Depression Father    Alcohol abuse Father    Emphysema Father        smoked   Drug abuse Brother    Alcohol abuse Brother    Esophageal cancer Brother    Depression Maternal Aunt    Alcohol abuse Brother    Anxiety disorder Daughter    Depression Daughter    Suicidality Daughter    Alcohol abuse Daughter    Drug abuse Son    Alcohol abuse Son    Emphysema Sister        smoked    Past Surgical History:  Procedure Laterality Date   ANTERIOR CERVICAL DECOMP/DISCECTOMY FUSION N/A 05/25/2016   Procedure: Cervical five-six Anterior cervical decompression/diskectomy/fusion;  Surgeon: Ashok Pall, MD;  Location: [facility];  Service: Neurosurgery;  Laterality: N/A;   APPENDECTOMY     CARPAL TUNNEL RELEASE     CARPAL TUNNEL RELEASE Right 08/02/2017   Procedure: CARPAL TUNNEL RELEASE RIGHT;  Surgeon: Ashok Pall, MD;  Location: [facility];  Service: Neurosurgery;  Laterality: Right;  CARPAL TUNNEL RELEASE RIGHT   CARPAL TUNNEL RELEASE Left 11/29/2017   Procedure: CARPAL TUNNEL RELEASE LEFT;  Surgeon: Ashok Pall, MD;  Location: [facility];  Service: Neurosurgery;  Laterality: Left;  CARPAL TUNNEL RELEASE LEFT   COLONOSCOPY WITH PROPOFOL N/A 11/29/2016   Procedure: COLONOSCOPY WITH PROPOFOL;  Surgeon: [name], MD;  Location: [facility];  Service: Endoscopy;  Laterality: N/A;   ESOPHAGOGASTRODUODENOSCOPY (EGD) WITH PROPOFOL N/A 11/29/2016   Procedure: ESOPHAGOGASTRODUODENOSCOPY (EGD) WITH PROPOFOL;  Surgeon: [name], MD;  Location: [facility];  Service: Endoscopy;  Laterality: N/A;   IR  INJECT/THERA/INC NEEDLE/CATH/PLC EPI/[facility]ERV/[facility]  03/04/2020   JOINT REPLACEMENT     knee rotation     LEG TENDON SURGERY     OVARIAN CYST REMOVAL     REPLACEMENT TOTAL KNEE BILATERAL  2006   rotator cuff surgery     Social History   Occupational History   Occupation: Disabled    Employer: [employer]  Tobacco Use   Smoking status: Former    Packs/day: 1.00    Years: 31.00    Pack years: 31.00    Types: Cigarettes    Quit date: 11/18/1999    Years since quitting: 22.1   Smokeless tobacco: Never  Vaping Use   Vaping Use: Never used  Substance and Sexual Activity   Alcohol use: Yes    Alcohol/week: 0.0 standard drinks    Comment: occ.   Drug use: No   Sexual activity: Never

## 2022-01-01 ENCOUNTER — Ambulatory Visit: Payer: Medicaid Other | Admitting: Podiatry

## 2022-01-03 DIAGNOSIS — R3914 Feeling of incomplete bladder emptying: Secondary | ICD-10-CM | POA: Diagnosis not present

## 2022-01-03 DIAGNOSIS — N3021 Other chronic cystitis with hematuria: Secondary | ICD-10-CM | POA: Diagnosis not present

## 2022-01-04 ENCOUNTER — Encounter: Payer: Self-pay | Admitting: Gastroenterology

## 2022-01-15 DIAGNOSIS — S91109A Unspecified open wound of unspecified toe(s) without damage to nail, initial encounter: Secondary | ICD-10-CM | POA: Diagnosis not present

## 2022-01-15 DIAGNOSIS — G629 Polyneuropathy, unspecified: Secondary | ICD-10-CM | POA: Diagnosis not present

## 2022-01-15 DIAGNOSIS — R609 Edema, unspecified: Secondary | ICD-10-CM | POA: Diagnosis not present

## 2022-01-16 ENCOUNTER — Ambulatory Visit: Payer: Medicaid Other | Admitting: Podiatry

## 2022-01-17 DIAGNOSIS — N390 Urinary tract infection, site not specified: Secondary | ICD-10-CM | POA: Diagnosis not present

## 2022-01-30 ENCOUNTER — Encounter: Payer: Self-pay | Admitting: Podiatry

## 2022-01-30 ENCOUNTER — Ambulatory Visit (INDEPENDENT_AMBULATORY_CARE_PROVIDER_SITE_OTHER): Payer: Medicare Other | Admitting: Podiatry

## 2022-01-30 DIAGNOSIS — M79675 Pain in left toe(s): Secondary | ICD-10-CM | POA: Diagnosis not present

## 2022-01-30 DIAGNOSIS — M79674 Pain in right toe(s): Secondary | ICD-10-CM | POA: Diagnosis not present

## 2022-01-30 DIAGNOSIS — B351 Tinea unguium: Secondary | ICD-10-CM

## 2022-01-30 DIAGNOSIS — E1159 Type 2 diabetes mellitus with other circulatory complications: Secondary | ICD-10-CM

## 2022-01-30 NOTE — Progress Notes (Signed)
This patient returns to my office for at risk foot care.  This patient requires this care by a professional since this patient will be at risk due to having diabetes with angiopathy. She presents to the office in motorized whellchair. ?This patient is unable to cut nails himself since the patient cannot reach his nails.These nails are painful walking and wearing shoes.  This patient presents for at risk foot care today. ? ?General Appearance  Alert, conversant and in no acute stress. ? ?Vascular  Dorsalis pedis and posterior tibial  pulses are palpable  bilaterally.  Capillary return is within normal limits  bilaterally. Temperature is within normal limits  bilaterally. ? ?Neurologic  Senn-Weinstein monofilament wire test within normal limits  bilaterally. Muscle power within normal limits bilaterally. ? ?Nails Thick disfigured discolored nails with subungual debris  from hallux to fifth toes bilaterally. No evidence of bacterial infection or drainage bilaterally. ? ?Orthopedic  No limitations of motion  feet .  No crepitus or effusions noted.  No bony pathology or digital deformities noted. ? ?Skin  normotropic skin with no porokeratosis noted bilaterally.  No signs of infections or ulcers noted.    ? ?Onychomycosis  Pain in right toes  Pain in left toes ? ?Consent was obtained for treatment procedures.   Mechanical debridement of nails 1-5  bilaterally performed with a nail nipper.  Filed with dremel without incident.  ? ? ?Return office visit     4 months                 Told patient to return for periodic foot care and evaluation due to potential at risk complications. ? ? ?Gardiner Barefoot DPM   ?

## 2022-02-08 ENCOUNTER — Other Ambulatory Visit (HOSPITAL_COMMUNITY): Payer: Self-pay | Admitting: Psychiatry

## 2022-02-08 DIAGNOSIS — F319 Bipolar disorder, unspecified: Secondary | ICD-10-CM

## 2022-02-09 DIAGNOSIS — Z20822 Contact with and (suspected) exposure to covid-19: Secondary | ICD-10-CM | POA: Diagnosis not present

## 2022-02-09 DIAGNOSIS — J439 Emphysema, unspecified: Secondary | ICD-10-CM | POA: Diagnosis not present

## 2022-02-09 DIAGNOSIS — R3914 Feeling of incomplete bladder emptying: Secondary | ICD-10-CM | POA: Diagnosis not present

## 2022-02-09 DIAGNOSIS — K760 Fatty (change of) liver, not elsewhere classified: Secondary | ICD-10-CM | POA: Diagnosis not present

## 2022-02-09 DIAGNOSIS — G629 Polyneuropathy, unspecified: Secondary | ICD-10-CM | POA: Diagnosis not present

## 2022-02-09 DIAGNOSIS — E782 Mixed hyperlipidemia: Secondary | ICD-10-CM | POA: Diagnosis not present

## 2022-02-09 DIAGNOSIS — K219 Gastro-esophageal reflux disease without esophagitis: Secondary | ICD-10-CM | POA: Diagnosis not present

## 2022-02-09 DIAGNOSIS — E220 Acromegaly and pituitary gigantism: Secondary | ICD-10-CM | POA: Diagnosis not present

## 2022-02-09 DIAGNOSIS — R609 Edema, unspecified: Secondary | ICD-10-CM | POA: Diagnosis not present

## 2022-02-09 DIAGNOSIS — N3021 Other chronic cystitis with hematuria: Secondary | ICD-10-CM | POA: Diagnosis not present

## 2022-02-09 DIAGNOSIS — M810 Age-related osteoporosis without current pathological fracture: Secondary | ICD-10-CM | POA: Diagnosis not present

## 2022-02-09 DIAGNOSIS — E118 Type 2 diabetes mellitus with unspecified complications: Secondary | ICD-10-CM | POA: Diagnosis not present

## 2022-03-05 DIAGNOSIS — N3021 Other chronic cystitis with hematuria: Secondary | ICD-10-CM | POA: Diagnosis not present

## 2022-03-05 DIAGNOSIS — R3914 Feeling of incomplete bladder emptying: Secondary | ICD-10-CM | POA: Diagnosis not present

## 2022-03-08 ENCOUNTER — Telehealth (HOSPITAL_COMMUNITY): Payer: Self-pay | Admitting: *Deleted

## 2022-03-08 ENCOUNTER — Ambulatory Visit (HOSPITAL_BASED_OUTPATIENT_CLINIC_OR_DEPARTMENT_OTHER): Payer: Medicare Other | Admitting: Psychiatry

## 2022-03-08 ENCOUNTER — Encounter (HOSPITAL_COMMUNITY): Payer: Self-pay | Admitting: Psychiatry

## 2022-03-08 DIAGNOSIS — F319 Bipolar disorder, unspecified: Secondary | ICD-10-CM | POA: Diagnosis not present

## 2022-03-08 DIAGNOSIS — F419 Anxiety disorder, unspecified: Secondary | ICD-10-CM

## 2022-03-08 MED ORDER — BUSPIRONE HCL 10 MG PO TABS: ORAL_TABLET | ORAL | 0 refills | Status: DC

## 2022-03-08 MED ORDER — AMITRIPTYLINE HCL 100 MG PO TABS: 100.0000 mg | ORAL_TABLET | Freq: Every day | ORAL | 0 refills | Status: DC

## 2022-03-08 MED ORDER — LAMOTRIGINE 200 MG PO TABS: ORAL_TABLET | ORAL | 0 refills | Status: DC
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
## 2022-03-08 NOTE — Telephone Encounter (Signed)
VSS ON VISIT TODAY: ? ?BP 116/75 ? ?PULSE 90 ? ?SATS 92 % ? ?RESP 20 ? ?PT STATED THAT SHE IS UNABLE TO STAND ON SCALE. NO WEIGHT TAKEN. ?

## 2022-03-30 ENCOUNTER — Encounter (HOSPITAL_COMMUNITY): Payer: Self-pay | Admitting: *Deleted

## 2022-03-30 ENCOUNTER — Other Ambulatory Visit: Payer: Self-pay

## 2022-03-30 ENCOUNTER — Ambulatory Visit (HOSPITAL_COMMUNITY)
Admission: EM | Admit: 2022-03-30 | Discharge: 2022-03-30 | Disposition: A | Discharge status: Home / Self Care | Payer: Medicare Other | Attending: Physician Assistant | Admitting: Physician Assistant

## 2022-03-30 DIAGNOSIS — R319 Hematuria, unspecified: Secondary | ICD-10-CM | POA: Diagnosis not present

## 2022-03-30 LAB — POCT URINALYSIS DIPSTICK, ED / UC
Bilirubin Urine: NEGATIVE
Glucose, UA: NEGATIVE mg/dL
Hgb urine dipstick: NEGATIVE
Ketones, ur: NEGATIVE mg/dL
Nitrite: NEGATIVE
Protein, ur: NEGATIVE mg/dL
Specific Gravity, Urine: 1.015 (ref 1.005–1.030)
Urobilinogen, UA: 0.2 mg/dL (ref 0.0–1.0)
pH: 7.5 (ref 5.0–8.0)

## 2022-03-30 MED ORDER — CIPROFLOXACIN HCL 500 MG PO TABS: 500.0000 mg | ORAL_TABLET | Freq: Two times a day (BID) | ORAL | 0 refills | Status: DC

## 2022-03-30 NOTE — ED Triage Notes (Signed)
Pt has a Foley cath. Ansd has seen blood in her urine.

## 2022-03-30 NOTE — Discharge Instructions (Signed)
Take antibiotic as prescribed Follow up with urologist Return for evaluation if you develop worsening symptoms.

## 2022-03-30 NOTE — ED Provider Notes (Signed)
[place]    CSN: [id number] Arrival date & time: 03/30/22  1700      History   Chief Complaint Chief Complaint  Patient presents with   Hematuria    HPI [name] is a 66 y.o. female.   Pt with foley catheter in place presents with hematuria and lower abdominal discomfort bladder spasm and burning around catheter.  She is followed by urology and has an appointment next week.  Denies fever, chill, abdominal pain, flank pain.  She has taken nothing for her sx.    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[place])    Anxiety    Arthritis    Bacterial infection    Benign paroxysmal positional vertigo 02/24/2013   Bipolar disorder ([place])    Bladder infection    Cancer ([place])    skin cancer   Cerebral palsy (HCC)    Colitis    COPD (chronic obstructive pulmonary disease) (HCC)    mild emphysema   Depression    bi polar   Diabetes mellitus, type II ([place])    Dysplasia of cervix    GERD (gastroesophageal reflux disease)    Headache(784.0)    High cholesterol    History of chicken pox    History of measles    History of mumps    IBS (irritable bowel syndrome)    Kidney infection    Melanoma in situ ([place])    Neck mass    Neuromuscular disorder (HCC)    Ovarian cyst    Poor circulation    Stenosis, cervical spine 05/16   Trichomonas    UTI (urinary tract infection)    current   Yeast infection     Patient Active Problem List   Diagnosis Date Noted   Neck pain 11/21/2018   Low back pain 08/19/2018   Weakness of both lower extremities 08/19/2018   Pain in finger of left hand 05/28/2018   Pain in right hand 05/28/2018   Pain in left elbow 04/02/2018   Chronic right shoulder pain 07/16/2017   Rotator cuff syndrome, right 05/13/2017   Cervical radiculopathy 01/14/2017   Colon cancer screening    Encounter for screening for gastric cancer    Osteoarthritis of spine with radiculopathy, cervical region 05/25/2016   Postmenopausal bleeding  08/04/2015   Acute respiratory failure ([place]) 07/19/2013   Nocturnal hypoxemia 07/07/2013   UTI (lower urinary tract infection) 07/07/2013   Hypokalemia 07/07/2013   Abdominal pain, acute, right lower quadrant 07/07/2013   Benign paroxysmal positional vertigo 02/24/2013   Edema of both legs 02/24/2013   Dysplasia of cervix    Melanoma in situ [facility])    Bipolar 1 disorder ([place]) 02/08/2012   IBS 12/04/2007   RASH AND OTHER NONSPECIFIC SKIN ERUPTION 12/04/2007   EMPHYSEMA by CT only 09/05/2007   NECK MASS 09/05/2007   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[place]) 08/13/2007   Cerebral palsy ([place]) 08/13/2007   OTITIS EXTERNA, ACUTE 08/13/2007   IRRITABLE BOWEL SYNDROME, HX OF 08/13/2007   HYPERLIPIDEMIA 05/10/2007   DEPRESSION 05/10/2007   ALLERGIC RHINITIS 05/10/2007    Past Surgical History:  Procedure Laterality Date   ANTERIOR CERVICAL DECOMP/DISCECTOMY FUSION N/A 05/25/2016   Procedure: Cervical five-six Anterior cervical decompression/diskectomy/fusion;  Surgeon: Ashok Pall, MD;  Location: [place] [facility];  Service: Neurosurgery;  Laterality: N/A;   APPENDECTOMY     CARPAL TUNNEL RELEASE     CARPAL TUNNEL RELEASE Right 08/02/2017   Procedure: CARPAL TUNNEL RELEASE RIGHT;  Surgeon: Ashok Pall,  MD;  Location: [place];  Service: Neurosurgery;  Laterality: Right;  CARPAL TUNNEL RELEASE RIGHT   CARPAL TUNNEL RELEASE Left 11/29/2017   Procedure: CARPAL TUNNEL RELEASE LEFT;  Surgeon: Ashok Pall, MD;  Location: [place];  Service: Neurosurgery;  Laterality: Left;  CARPAL TUNNEL RELEASE LEFT   COLONOSCOPY WITH PROPOFOL N/A 11/29/2016   Procedure: COLONOSCOPY WITH PROPOFOL;  Surgeon: [name], MD;  Location: [facility];  Service: Endoscopy;  Laterality: N/A;   ESOPHAGOGASTRODUODENOSCOPY (EGD) WITH PROPOFOL N/A 11/29/2016   Procedure: ESOPHAGOGASTRODUODENOSCOPY (EGD) WITH PROPOFOL;  Surgeon: [name], MD;  Location: [facility];  Service: Endoscopy;  Laterality: N/A;   IR INJECT/THERA/INC NEEDLE/CATH/PLC  EPI/CERV/THOR [facility]  03/04/2020   JOINT REPLACEMENT     knee rotation     LEG TENDON SURGERY     OVARIAN CYST REMOVAL     REPLACEMENT TOTAL KNEE BILATERAL  2006   rotator cuff surgery      OB History     Gravida  3   Para  2   Term      Preterm      AB      Living  2      SAB      IAB      Ectopic      Multiple      Live Births               Home Medications    Prior to Admission medications   Medication Sig Start Date End Date Taking? Authorizing Provider  ciprofloxacin (CIPRO) 500 MG tablet Take 1 tablet (500 mg total) by mouth every 12 (twelve) hours. 03/30/22  Yes Ward, Lenise Arena, PA-C  Acetylcarnitine HCl (ACETYL-L-CARNITINE HCL) POWD by Does not apply route.    [provider]  albuterol (PROVENTIL HFA;VENTOLIN HFA) 108 (90 Base) MCG/ACT inhaler Inhale 1-2 puffs into the lungs every 6 (six) hours as needed for wheezing or shortness of breath.    [provider]  alendronate (FOSAMAX) 70 MG tablet Take 70 mg by mouth every Monday. Take with a full glass of water on an empty stomach.    [provider]  amitriptyline (ELAVIL) 100 MG tablet Take 1 tablet (100 mg total) by mouth at bedtime. 03/08/22   [name], MD  AQUALANCE LANCETS 30G MISC  04/25/18   [provider]  Ascorbic Acid (VITAMIN C) 500 MG CAPS Take 500 mg by mouth at bedtime.     [provider]  atorvastatin (LIPITOR) 40 MG tablet 1 tablet    [provider]  Blood Glucose Calibration (OT ULTRA/FASTTK CNTRL SOLN) SOLN  05/05/18   [provider]  Blood Glucose Monitoring Suppl (ONE TOUCH ULTRA 2) w/Device KIT  05/05/18   [provider]  busPIRone (BUSPAR) 10 MG tablet TAKE 1 TABLET(10 MG) BY MOUTH TWICE DAILY 03/08/22   Arfeen, Arlyce Harman, MD  Calcium Carb-Cholecalciferol (CALCIUM 600+D3 PO) Take 1 tablet by mouth daily.    [provider]  cyanocobalamin 100 MCG tablet Take by mouth.    [provider]   esomeprazole (NEXIUM) 40 MG capsule TK ONE C PO D FOR ANTIACID 06/05/19   [provider]  fluticasone (FLONASE) 50 MCG/ACT nasal spray 2 sprays by Each Nare route daily.    [provider]  Fluticasone Propionate, Inhal, 50 MCG/ACT AEPB Inhale into the lungs.    [provider]  folic acid (FOLVITE) 701 MCG tablet 1 tablet    [provider]  furosemide (LASIX) 40 MG tablet Take 1 tablet by mouth daily. 08/10/19   [provider]  gabapentin (NEURONTIN) 300 MG capsule Take 300 mg by mouth 2 (two) times daily. 07/28/21   [provider]  glipiZIDE (GLUCOTROL XL) 10 MG 24 hr tablet TK 1 T PO QAM FOR DIABETES 04/08/19   [provider]  hydrocortisone 1 % ointment Apply 1 application topically 2 (two) times daily. 08/14/19   Levin Erp, PA  lamoTRIgine (LAMICTAL) 200 MG tablet TAKE 1 TABLET(200 MG) BY MOUTH DAILY. 03/08/22   [name], MD  Lancet Devices (ADJUSTABLE LANCING DEVICE) MISC  05/05/18   [provider]  Lysine 500 MG CAPS Take by mouth.    [provider]  Melatonin 10 MG TABS Take 1 tablet by mouth at bedtime.    [provider]  metFORMIN (GLUCOPHAGE-XR) 500 MG 24 hr tablet Take 500 mg by mouth daily with breakfast. IN THE MORNING.    [provider]  methenamine (HIPREX) 1 g tablet Take 1 g by mouth at bedtime.     [provider]  mirabegron ER (MYRBETRIQ) 25 MG TB24 tablet Take by mouth.    [provider]  ONE TOUCH ULTRA TEST test strip  05/05/18   [provider]  orlistat (XENICAL) 120 MG capsule Take by mouth.    [provider]  pilocarpine (SALAGEN) 5 MG tablet Take by mouth.    [provider]  Polyethyl Glycol-Propyl Glycol 0.4-0.3 % SOLN Place 1-2 drops into both eyes 3 (three) times daily as needed (for dry eyes.).    [provider]  potassium chloride (KLOR-CON) 10 MEQ tablet Take 10 mEq by mouth daily.  05/16/21   [provider]  Propylene Glycol-Glycerin 1-0.3 % SOLN Apply 1 drop to eye in the morning and at bedtime.    [provider]  ranitidine (ZANTAC) 300 MG tablet Take 300 mg by mouth daily.    [provider]  risedronate (ACTONEL) 150 MG tablet Take 150 mg by mouth every morning. 06/09/20   [provider]  spironolactone (ALDACTONE) 100 MG tablet Take 100 mg by mouth daily.  01/28/15   [provider]  tamsulosin (FLOMAX) 0.4 MG CAPS capsule Take 0.4 mg by mouth at bedtime. 03/06/16   [provider]  triamterene-hydrochlorothiazide (MAXZIDE) 75-50 MG tablet Take by mouth.    [provider]    Family History Family History  Problem Relation Age of Onset   Suicidality Father    Depression Father    Alcohol abuse Father    Emphysema Father        smoked   Drug abuse Brother    Alcohol abuse Brother    Esophageal cancer Brother    Depression Maternal Aunt    Alcohol abuse Brother    Anxiety disorder Daughter    Depression Daughter    Suicidality Daughter    Alcohol abuse Daughter    Drug abuse Son    Alcohol abuse Son    Emphysema Sister        smoked    Social History Social History   Tobacco Use   Smoking status: Former    Packs/day: 1.00    Years: 31.00    Pack years: 31.00    Types: Cigarettes    Quit date: 11/18/1999    Years since quitting: 22.3   Smokeless tobacco: Never  Vaping Use   Vaping Use: Never used  Substance  Use Topics   Alcohol use: Yes    Alcohol/week: 0.0 standard drinks    Comment: occ.   Drug use: No     Allergies   Miconazole nitrate and Sulfonamide derivatives   Review of Systems Review of Systems  Constitutional:  Negative for chills and fever.  HENT:  Negative for ear pain and sore throat.   Eyes:  Negative for pain and visual disturbance.  Respiratory:  Negative for cough and shortness of breath.   Cardiovascular:  Negative for chest pain and palpitations.   Gastrointestinal:  Negative for abdominal pain and vomiting.  Genitourinary:  Positive for dysuria and hematuria. Negative for flank pain.  Musculoskeletal:  Negative for arthralgias and back pain.  Skin:  Negative for color change and rash.  Neurological:  Negative for seizures and syncope.  All other systems reviewed and are negative.   Physical Exam Triage Vital Signs ED Triage Vitals  Enc Vitals Group     BP 03/30/22 1724 113/68     Pulse Rate 03/30/22 1724 91     Resp 03/30/22 1724 18     Temp 03/30/22 1724 98.8 F (37.1 C)     Temp src --      SpO2 03/30/22 1724 94 %     Weight --      Height --      Head Circumference --      Peak Flow --      Pain Score 03/30/22 1721 6     Pain Loc --      Pain Edu? --      Excl. in [place]? --    No data found.  Updated Vital Signs BP 113/68   Pulse 91   Temp 98.8 F (37.1 C)   Resp 18   SpO2 94%   Visual Acuity Right Eye Distance:   Left Eye Distance:   Bilateral Distance:    Right Eye Near:   Left Eye Near:    Bilateral Near:     Physical Exam Vitals and nursing note reviewed.  Constitutional:      General: She is not in acute distress.    Appearance: She is well-developed.  HENT:     Head: Normocephalic and atraumatic.  Eyes:     Conjunctiva/sclera: Conjunctivae normal.  Cardiovascular:     Rate and Rhythm: Normal rate and regular rhythm.     Heart sounds: No murmur heard. Pulmonary:     Effort: Pulmonary effort is normal. No respiratory distress.     Breath sounds: Normal breath sounds.  Abdominal:     Palpations: Abdomen is soft.     Tenderness: There is no abdominal tenderness.  Musculoskeletal:        General: No swelling.     Cervical back: Neck supple.  Skin:    General: Skin is warm and dry.     Capillary Refill: Capillary refill takes less than 2 seconds.  Neurological:     Mental Status: She is alert.  Psychiatric:        Mood and Affect: Mood normal.     UC Treatments / Results   Labs (all labs ordered are listed, but only abnormal results are displayed) Labs Reviewed  POCT URINALYSIS DIPSTICK, ED / UC - Abnormal; Notable for the following components:      Result Value   Leukocytes,Ua LARGE (*)    All other components within normal limits    EKG   Radiology No results found.  Procedures Procedures (including critical  care time)  Medications Ordered in UC Medications - No data to display  Initial Impression / Assessment and Plan / UC Course  I have reviewed the triage vital signs and the nursing notes.  Pertinent labs & imaging results that were available during my care of the patient were reviewed by me and considered in my medical decision making (see chart for details).    Pt with hematuria and discomfort with foley cath in place, will cover for UTI given sx and catheter.  She will keep her appointment with urology next week. ED precautions given. Vitals wnl, stable for discharge.  Final Clinical Impressions(s) / UC Diagnoses   Final diagnoses:  Hematuria, unspecified type     Discharge Instructions      Take antibiotic as prescribed Follow up with urologist Return for evaluation if you develop worsening symptoms.    ED Prescriptions     Medication Sig Dispense Auth. Provider   ciprofloxacin (CIPRO) 500 MG tablet Take 1 tablet (500 mg total) by mouth every 12 (twelve) hours. 10 tablet Ward, Lenise Arena, PA-C      PDMP not reviewed this encounter.   Ward, Lenise Arena, PA-C 03/30/22 [phone]

## 2022-04-03 DIAGNOSIS — R3914 Feeling of incomplete bladder emptying: Secondary | ICD-10-CM | POA: Diagnosis not present

## 2022-04-03 DIAGNOSIS — N3021 Other chronic cystitis with hematuria: Secondary | ICD-10-CM | POA: Diagnosis not present

## 2022-04-11 DIAGNOSIS — N319 Neuromuscular dysfunction of bladder, unspecified: Secondary | ICD-10-CM | POA: Diagnosis not present

## 2022-04-11 DIAGNOSIS — N130 Hydronephrosis with ureteropelvic junction obstruction: Secondary | ICD-10-CM | POA: Diagnosis not present

## 2022-04-12 ENCOUNTER — Other Ambulatory Visit (HOSPITAL_COMMUNITY): Payer: Self-pay | Admitting: Psychiatry

## 2022-04-12 DIAGNOSIS — F419 Anxiety disorder, unspecified: Secondary | ICD-10-CM

## 2022-05-15 DIAGNOSIS — N3021 Other chronic cystitis with hematuria: Secondary | ICD-10-CM | POA: Diagnosis not present

## 2022-05-15 DIAGNOSIS — R3914 Feeling of incomplete bladder emptying: Secondary | ICD-10-CM | POA: Diagnosis not present

## 2022-05-16 DIAGNOSIS — R3914 Feeling of incomplete bladder emptying: Secondary | ICD-10-CM | POA: Diagnosis not present

## 2022-05-16 DIAGNOSIS — N3021 Other chronic cystitis with hematuria: Secondary | ICD-10-CM | POA: Diagnosis not present

## 2022-06-07 ENCOUNTER — Encounter (HOSPITAL_COMMUNITY): Payer: Self-pay | Admitting: Psychiatry

## 2022-06-07 ENCOUNTER — Ambulatory Visit (HOSPITAL_BASED_OUTPATIENT_CLINIC_OR_DEPARTMENT_OTHER): Payer: Medicare Other | Admitting: Psychiatry

## 2022-06-07 DIAGNOSIS — F419 Anxiety disorder, unspecified: Secondary | ICD-10-CM

## 2022-06-07 DIAGNOSIS — F319 Bipolar disorder, unspecified: Secondary | ICD-10-CM | POA: Diagnosis not present

## 2022-06-07 MED ORDER — BUSPIRONE HCL 10 MG PO TABS: ORAL_TABLET | ORAL | 0 refills | Status: DC

## 2022-06-07 MED ORDER — LAMOTRIGINE 200 MG PO TABS: ORAL_TABLET | ORAL | 0 refills | Status: DC

## 2022-06-07 MED ORDER — AMITRIPTYLINE HCL 100 MG PO TABS: 100.0000 mg | ORAL_TABLET | Freq: Every day | ORAL | 0 refills | Status: DC
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
## 2022-06-18 DIAGNOSIS — N3021 Other chronic cystitis with hematuria: Secondary | ICD-10-CM | POA: Diagnosis not present

## 2022-06-18 DIAGNOSIS — R3914 Feeling of incomplete bladder emptying: Secondary | ICD-10-CM | POA: Diagnosis not present

## 2022-06-19 DIAGNOSIS — G629 Polyneuropathy, unspecified: Secondary | ICD-10-CM | POA: Diagnosis not present

## 2022-06-19 DIAGNOSIS — R404 Transient alteration of awareness: Secondary | ICD-10-CM | POA: Diagnosis not present

## 2022-06-19 DIAGNOSIS — K760 Fatty (change of) liver, not elsewhere classified: Secondary | ICD-10-CM | POA: Diagnosis not present

## 2022-06-19 DIAGNOSIS — R609 Edema, unspecified: Secondary | ICD-10-CM | POA: Diagnosis not present

## 2022-06-19 DIAGNOSIS — K219 Gastro-esophageal reflux disease without esophagitis: Secondary | ICD-10-CM | POA: Diagnosis not present

## 2022-06-19 DIAGNOSIS — E118 Type 2 diabetes mellitus with unspecified complications: Secondary | ICD-10-CM | POA: Diagnosis not present

## 2022-06-19 DIAGNOSIS — M810 Age-related osteoporosis without current pathological fracture: Secondary | ICD-10-CM | POA: Diagnosis not present

## 2022-06-19 DIAGNOSIS — E782 Mixed hyperlipidemia: Secondary | ICD-10-CM | POA: Diagnosis not present

## 2022-07-05 DIAGNOSIS — G809 Cerebral palsy, unspecified: Secondary | ICD-10-CM | POA: Diagnosis not present

## 2022-07-05 DIAGNOSIS — N390 Urinary tract infection, site not specified: Secondary | ICD-10-CM | POA: Diagnosis not present

## 2022-08-02 DIAGNOSIS — R3914 Feeling of incomplete bladder emptying: Secondary | ICD-10-CM | POA: Diagnosis not present

## 2022-08-07 ENCOUNTER — Ambulatory Visit: Payer: Medicare Other | Admitting: Physician Assistant

## 2022-08-08 ENCOUNTER — Other Ambulatory Visit (HOSPITAL_COMMUNITY): Payer: Self-pay | Admitting: Psychiatry

## 2022-08-08 DIAGNOSIS — F419 Anxiety disorder, unspecified: Secondary | ICD-10-CM

## 2022-08-09 ENCOUNTER — Ambulatory Visit (INDEPENDENT_AMBULATORY_CARE_PROVIDER_SITE_OTHER): Payer: Medicare Other

## 2022-08-09 ENCOUNTER — Encounter: Payer: Self-pay | Admitting: Physician Assistant

## 2022-08-09 ENCOUNTER — Ambulatory Visit (INDEPENDENT_AMBULATORY_CARE_PROVIDER_SITE_OTHER): Payer: Medicare Other | Admitting: Physician Assistant

## 2022-08-09 ENCOUNTER — Telehealth: Payer: Self-pay

## 2022-08-09 ENCOUNTER — Ambulatory Visit: Payer: Self-pay

## 2022-08-09 DIAGNOSIS — M25512 Pain in left shoulder: Secondary | ICD-10-CM | POA: Diagnosis not present

## 2022-08-09 DIAGNOSIS — G8929 Other chronic pain: Secondary | ICD-10-CM

## 2022-08-09 DIAGNOSIS — M25511 Pain in right shoulder: Secondary | ICD-10-CM | POA: Diagnosis not present

## 2022-08-09 NOTE — Addendum Note (Signed)
Addended by: Georgette Dover on: 08/09/2022 01:17 PM   Modules accepted: Orders

## 2022-08-09 NOTE — Patient Outreach (Signed)
  Care Coordination   08/09/2022 Name: [name] MRN: [id number] DOB: Nov 02, 1955   Care Coordination Outreach Attempts:  An unsuccessful telephone outreach was attempted today to offer the patient information about available care coordination services as a benefit of their health plan.   Follow Up Plan:  Additional outreach attempts will be made to offer the patient care coordination information and services.   Encounter Outcome:  No Answer  Care Coordination Interventions Activated:  No   Care Coordination Interventions:  No, not indicated    Tomasa Rand, RN, BSN, CEN [facility] [employer] [phone]

## 2022-08-09 NOTE — Progress Notes (Signed)
Office Visit Note   Patient: [name]           Date of Birth: 02/22/1956           MRN: [id number] Visit Date: 08/09/2022              Requested by: Helen Hashimoto., MD [address] [place],  [place] [id number] PCP: Helen Hashimoto., MD  Chief Complaint  Patient presents with   Right Shoulder - Pain   Left Shoulder - Pain      HPI: [name] is a pleasant 66 year old woman who I have seen in the past for shoulder pain.  She has a history of a cervical fusion and has had neck problems in the past.  She comes in today complaining of left greater than right shoulder pain after a fall recently.  She is also noticed a little knot on her right shoulder.  She thinks the fall was about 3 months ago.  She has pain with range of motion in both shoulders.  Denies any really neck symptoms or any radicular symptoms.  She uses a power scooter for ambulation  Assessment & Plan: Visit Diagnoses:  1. Acute pain of left shoulder   2. Acute pain of right shoulder   3. Chronic pain of both shoulders     Plan: Her right shoulder is not very symptomatic for her today it demonstrates good motion and strength.  She has no radicular findings in either arm and her strength is good.  On the left side she does have some impingement findings with a positive empty can test.  She does have good strength 5 out of 5 with resisted abduction external and internal rotation good grip strength distally.  I do think she has some rotator cuff tendinitis.  Had a long discussion.  I did give her an injection in February which helped was not long-lasting.  We did discuss physical therapy I think this would be a good option for her I will refer her to for occupational therapy for the rotator cuff.  If she does not ultimately get improvement she will contact me and I will order an MRI  Follow-Up Instructions: No follow-ups on file.   Ortho Exam  Patient is alert, oriented, no adenopathy,  well-dressed, normal affect, normal respiratory effort. Examination of her right shoulder full range of motion she does have some tenderness over the [facility] joint.  No ear ecchymosis no tenderness over the clavicle no impingement findings.  Left shoulder she is slightly stiff with forward elevation and has some pain.  Slightly less internal rotation behind her back.  She has 5 out of 5 strength and excellent grip strength.  She is distally neurovascularly intact.  Positive empty can test  Imaging: No results found. No images are attached to the encounter.  Labs: Lab Results  Component Value Date   HGBA1C 7.2 (H) 11/29/2017   HGBA1C 6.6 (H) 07/30/2017   HGBA1C 6.9 (H) 05/02/2016   REPTSTATUS 09/18/2020 FINAL 09/17/2020   CULT MULTIPLE SPECIES PRESENT, SUGGEST RECOLLECTION (A) 09/17/2020   LABORGA CITROBACTER FREUNDII 07/07/2013     Lab Results  Component Value Date   ALBUMIN 4.5 12/15/2015   ALBUMIN 3.9 11/18/2013   ALBUMIN 3.9 07/07/2013    Lab Results  Component Value Date   MG 1.9 07/08/2013   No results found for: "VD25OH"  No results found for: "PREALBUMIN"    Latest Ref Rng & Units 11/29/2017  9:23 AM 07/30/2017   11:17 AM 08/24/2016    4:19 PM  CBC EXTENDED  WBC 4.0 - 10.5 K/uL  10.6    RBC 3.87 - 5.11 MIL/uL  5.02    Hemoglobin 12.0 - 15.0 g/dL 15.6  13.7  16.0   HCT 36.0 - 46.0 %  43.2  47.0   Platelets 150 - 400 K/uL  309       There is no height or weight on file to calculate BMI.  Orders:  Orders Placed This Encounter  Procedures   XR Shoulder Right   XR Shoulder Left   No orders of the defined types were placed in this encounter.    Procedures: No procedures performed  Clinical Data: No additional findings.  ROS:  All other systems negative, except as noted in the HPI. Review of Systems  All other systems reviewed and are negative.   Objective: Vital Signs: There were no vitals taken for this visit.  Specialty Comments:  No specialty  comments available.  PMFS History: Patient Active Problem List   Diagnosis Date Noted   Bilateral shoulder pain 08/09/2022   Neck pain 11/21/2018   Low back pain 08/19/2018   Weakness of both lower extremities 08/19/2018   Pain in finger of left hand 05/28/2018   Pain in right hand 05/28/2018   Pain in left elbow 04/02/2018   Chronic right shoulder pain 07/16/2017   Rotator cuff syndrome, right 05/13/2017   Cervical radiculopathy 01/14/2017   Colon cancer screening    Encounter for screening for gastric cancer    Osteoarthritis of spine with radiculopathy, cervical region 05/25/2016   Postmenopausal bleeding 08/04/2015   Acute respiratory failure ([place]) 07/19/2013   Nocturnal hypoxemia 07/07/2013   UTI (lower urinary tract infection) 07/07/2013   Hypokalemia 07/07/2013   Abdominal pain, acute, right lower quadrant 07/07/2013   Benign paroxysmal positional vertigo 02/24/2013   Edema of both legs 02/24/2013   Dysplasia of cervix    Melanoma in situ [facility])    Bipolar 1 disorder ([place]) 02/08/2012   IBS 12/04/2007   RASH AND OTHER NONSPECIFIC SKIN ERUPTION 12/04/2007   EMPHYSEMA by CT only 09/05/2007   NECK MASS 09/05/2007   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[place]) 08/13/2007   Cerebral palsy ([place]) 08/13/2007   OTITIS EXTERNA, ACUTE 08/13/2007   IRRITABLE BOWEL SYNDROME, HX OF 08/13/2007   HYPERLIPIDEMIA 05/10/2007   DEPRESSION 05/10/2007   ALLERGIC RHINITIS 05/10/2007   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[place])    Anxiety    Arthritis    Bacterial infection    Benign paroxysmal positional vertigo 02/24/2013   Bipolar disorder ([place])    Bladder infection    Cancer ([place])    skin cancer   Cerebral palsy (HCC)    Colitis    COPD (chronic obstructive pulmonary disease) ([place])    mild emphysema   Depression    bi polar   Diabetes mellitus, type II ([place])    Dysplasia of cervix    GERD (gastroesophageal reflux disease)    Headache(784.0)    High cholesterol    History of chicken  pox    History of measles    History of mumps    IBS (irritable bowel syndrome)    Kidney infection    Melanoma in situ ([place])    Neck mass    Neuromuscular disorder (HCC)    Ovarian cyst    Poor circulation    Stenosis, cervical spine 05/16   Trichomonas  UTI (urinary tract infection)    current   Yeast infection     Family History  Problem Relation Age of Onset   Suicidality Father    Depression Father    Alcohol abuse Father    Emphysema Father        smoked   Drug abuse Brother    Alcohol abuse Brother    Esophageal cancer Brother    Depression Maternal Aunt    Alcohol abuse Brother    Anxiety disorder Daughter    Depression Daughter    Suicidality Daughter    Alcohol abuse Daughter    Drug abuse Son    Alcohol abuse Son    Emphysema Sister        smoked    Past Surgical History:  Procedure Laterality Date   ANTERIOR CERVICAL DECOMP/DISCECTOMY FUSION N/A 05/25/2016   Procedure: Cervical five-six Anterior cervical decompression/diskectomy/fusion;  Surgeon: Ashok Pall, MD;  Location: [place] [facility];  Service: Neurosurgery;  Laterality: N/A;   APPENDECTOMY     CARPAL TUNNEL RELEASE     CARPAL TUNNEL RELEASE Right 08/02/2017   Procedure: CARPAL TUNNEL RELEASE RIGHT;  Surgeon: Ashok Pall, MD;  Location: [place];  Service: Neurosurgery;  Laterality: Right;  CARPAL TUNNEL RELEASE RIGHT   CARPAL TUNNEL RELEASE Left 11/29/2017   Procedure: CARPAL TUNNEL RELEASE LEFT;  Surgeon: Ashok Pall, MD;  Location: [place];  Service: Neurosurgery;  Laterality: Left;  CARPAL TUNNEL RELEASE LEFT   COLONOSCOPY WITH PROPOFOL N/A 11/29/2016   Procedure: COLONOSCOPY WITH PROPOFOL;  Surgeon: [name], MD;  [facility];  Service: Endoscopy;  Laterality: N/A;   ESOPHAGOGASTRODUODENOSCOPY (EGD) WITH PROPOFOL N/A 11/29/2016   Procedure: ESOPHAGOGASTRODUODENOSCOPY (EGD) WITH PROPOFOL;  Surgeon: [name], MD;  [facility];  Service: Endoscopy;  Laterality: N/A;    IR INJECT/THERA/INC NEEDLE/CATH/PLC EPI/CERV/THOR [facility]  03/04/2020   JOINT REPLACEMENT     knee rotation     LEG TENDON SURGERY     OVARIAN CYST REMOVAL     REPLACEMENT TOTAL KNEE BILATERAL  2006   rotator cuff surgery     Social History   Occupational History   Occupation: Disabled    Employer: [employer]  Tobacco Use   Smoking status: Former    Packs/day: 1.00    Years: 31.00    Total pack years: 31.00    Types: Cigarettes    Quit date: 11/18/1999    Years since quitting: 22.7   Smokeless tobacco: Never  Vaping Use   Vaping Use: Never used  Substance and Sexual Activity   Alcohol use: Yes    Alcohol/week: 0.0 standard drinks of alcohol    Comment: occ.   Drug use: No   Sexual activity: Never

## 2022-08-13 ENCOUNTER — Telehealth: Payer: Self-pay

## 2022-08-13 NOTE — Patient Outreach (Signed)
  Care Coordination   08/13/2022 Name: [name] MRN: [id number] DOB: [birth year]   Care Coordination Outreach Attempts:  A second unsuccessful outreach was attempted today to offer the patient with information about available care coordination services as a benefit of their health plan.     Follow Up Plan:  Additional outreach attempts will be made to offer the patient care coordination information and services.   Encounter Outcome:  No Answer  Care Coordination Interventions Activated:  No   Care Coordination Interventions:  No, not indicated    Tomasa Rand, RN, BSN, CEN [facility] [employer] [phone]

## 2022-08-21 DIAGNOSIS — S9032XA Contusion of left foot, initial encounter: Secondary | ICD-10-CM | POA: Diagnosis not present

## 2022-08-21 DIAGNOSIS — Z23 Encounter for immunization: Secondary | ICD-10-CM | POA: Diagnosis not present

## 2022-08-21 DIAGNOSIS — R609 Edema, unspecified: Secondary | ICD-10-CM | POA: Diagnosis not present

## 2022-08-23 DIAGNOSIS — N390 Urinary tract infection, site not specified: Secondary | ICD-10-CM | POA: Diagnosis not present

## 2022-08-27 ENCOUNTER — Telehealth: Payer: Self-pay

## 2022-08-27 NOTE — Patient Outreach (Signed)
  Care Coordination   Initial Visit Note   08/27/2022 Name: [name] MRN: [id number] DOB: 1956-10-17  [name] is a 66 y.o. year old female who sees Cyndi Bender, [place] for primary care. I spoke with  [name] by phone today.  What matters to the patients health and wellness today?  Placed call to patient to review and offer [facility] care coordination program. Patient reports she is doing well. Recovering from a UTI.  Denies any needs at this time.    SDOH assessments and interventions completed:  No     Care Coordination Interventions Activated:  No  Care Coordination Interventions:  No, not indicated   Follow up plan: No further intervention required.   Encounter Outcome:  Pt. Refused   Tomasa Rand, RN, BSN, CEN [facility] [employer] [phone]

## 2022-08-31 DIAGNOSIS — R3914 Feeling of incomplete bladder emptying: Secondary | ICD-10-CM | POA: Diagnosis not present

## 2022-09-06 ENCOUNTER — Encounter (HOSPITAL_COMMUNITY): Payer: Self-pay | Admitting: Psychiatry

## 2022-09-06 ENCOUNTER — Telehealth (HOSPITAL_BASED_OUTPATIENT_CLINIC_OR_DEPARTMENT_OTHER): Payer: Medicare Other | Admitting: Psychiatry

## 2022-09-06 DIAGNOSIS — F319 Bipolar disorder, unspecified: Secondary | ICD-10-CM

## 2022-09-06 DIAGNOSIS — F419 Anxiety disorder, unspecified: Secondary | ICD-10-CM

## 2022-09-06 MED ORDER — LAMOTRIGINE 200 MG PO TABS: ORAL_TABLET | ORAL | 0 refills | Status: DC

## 2022-09-06 MED ORDER — AMITRIPTYLINE HCL 100 MG PO TABS: 100.0000 mg | ORAL_TABLET | Freq: Every day | ORAL | 0 refills | Status: DC
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
## 2022-09-10 DIAGNOSIS — E118 Type 2 diabetes mellitus with unspecified complications: Secondary | ICD-10-CM | POA: Diagnosis not present

## 2022-09-18 ENCOUNTER — Other Ambulatory Visit: Payer: Self-pay | Admitting: Physician Assistant

## 2022-09-18 DIAGNOSIS — M25512 Pain in left shoulder: Secondary | ICD-10-CM

## 2022-09-18 DIAGNOSIS — M25511 Pain in right shoulder: Secondary | ICD-10-CM

## 2022-09-19 DIAGNOSIS — K219 Gastro-esophageal reflux disease without esophagitis: Secondary | ICD-10-CM | POA: Diagnosis not present

## 2022-09-19 DIAGNOSIS — E118 Type 2 diabetes mellitus with unspecified complications: Secondary | ICD-10-CM | POA: Diagnosis not present

## 2022-09-19 DIAGNOSIS — R609 Edema, unspecified: Secondary | ICD-10-CM | POA: Diagnosis not present

## 2022-09-19 DIAGNOSIS — G629 Polyneuropathy, unspecified: Secondary | ICD-10-CM | POA: Diagnosis not present

## 2022-09-19 DIAGNOSIS — K760 Fatty (change of) liver, not elsewhere classified: Secondary | ICD-10-CM | POA: Diagnosis not present

## 2022-09-19 DIAGNOSIS — Z9181 History of falling: Secondary | ICD-10-CM | POA: Diagnosis not present

## 2022-09-19 DIAGNOSIS — E782 Mixed hyperlipidemia: Secondary | ICD-10-CM | POA: Diagnosis not present

## 2022-09-19 DIAGNOSIS — M810 Age-related osteoporosis without current pathological fracture: Secondary | ICD-10-CM | POA: Diagnosis not present

## 2022-09-19 DIAGNOSIS — R404 Transient alteration of awareness: Secondary | ICD-10-CM | POA: Diagnosis not present

## 2022-09-19 DIAGNOSIS — N319 Neuromuscular dysfunction of bladder, unspecified: Secondary | ICD-10-CM | POA: Diagnosis not present

## 2022-09-19 DIAGNOSIS — N390 Urinary tract infection, site not specified: Secondary | ICD-10-CM | POA: Diagnosis not present

## 2022-09-24 DIAGNOSIS — R350 Frequency of micturition: Secondary | ICD-10-CM | POA: Diagnosis not present

## 2022-09-25 ENCOUNTER — Encounter: Payer: Medicare Other | Admitting: Occupational Therapy

## 2022-10-01 DIAGNOSIS — R3914 Feeling of incomplete bladder emptying: Secondary | ICD-10-CM | POA: Diagnosis not present

## 2022-10-01 DIAGNOSIS — N3021 Other chronic cystitis with hematuria: Secondary | ICD-10-CM | POA: Diagnosis not present

## 2022-10-02 ENCOUNTER — Ambulatory Visit: Payer: Medicare Other

## 2022-10-03 ENCOUNTER — Ambulatory Visit: Payer: Medicare Other | Admitting: Physical Therapy

## 2022-10-04 ENCOUNTER — Other Ambulatory Visit (HOSPITAL_COMMUNITY): Payer: Self-pay | Admitting: *Deleted

## 2022-10-04 ENCOUNTER — Other Ambulatory Visit (HOSPITAL_COMMUNITY): Payer: Self-pay | Admitting: Psychiatry

## 2022-10-04 DIAGNOSIS — F319 Bipolar disorder, unspecified: Secondary | ICD-10-CM

## 2022-10-04 DIAGNOSIS — F419 Anxiety disorder, unspecified: Secondary | ICD-10-CM

## 2022-10-04 MED ORDER — BUSPIRONE HCL 10 MG PO TABS: ORAL_TABLET | ORAL | 1 refills | Status: DC

## 2022-10-04 NOTE — Therapy (Incomplete)
OUTPATIENT PHYSICAL THERAPY SHOULDER EVALUATION   Patient Name: [name] MRN: [id number] DOB:[birth year], 66 y.o., female Today's Date: 10/04/2022  END OF SESSION:   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[place])    Anxiety    Arthritis    Bacterial infection    Benign paroxysmal positional vertigo 02/24/2013   Bipolar disorder ([place])    Bladder infection    Cancer ([place])    skin cancer   Cerebral palsy ([facility])    Colitis    COPD (chronic obstructive pulmonary disease) ([place])    mild emphysema   Depression    bi polar   Diabetes mellitus, type II ([place])    Dysplasia of cervix    GERD (gastroesophageal reflux disease)    Headache(784.0)    High cholesterol    History of chicken pox    History of measles    History of mumps    IBS (irritable bowel syndrome)    Kidney infection    Melanoma in situ ([place])    Neck mass    Neuromuscular disorder ([facility])    Ovarian cyst    Poor circulation    Stenosis, cervical spine 05/16   Trichomonas    UTI (urinary tract infection)    current   Yeast infection    Past Surgical History:  Procedure Laterality Date   ANTERIOR CERVICAL DECOMP/DISCECTOMY FUSION N/A 05/25/2016   Procedure: Cervical five-six Anterior cervical decompression/diskectomy/fusion;  Surgeon: Ashok Pall, MD;  [facility];  Service: Neurosurgery;  Laterality: N/A;   APPENDECTOMY     CARPAL TUNNEL RELEASE     CARPAL TUNNEL RELEASE Right 08/02/2017   Procedure: CARPAL TUNNEL RELEASE RIGHT;  Surgeon: Ashok Pall, MD;  Location: [place];  Service: Neurosurgery;  Laterality: Right;  CARPAL TUNNEL RELEASE RIGHT   CARPAL TUNNEL RELEASE Left 11/29/2017   Procedure: CARPAL TUNNEL RELEASE LEFT;  Surgeon: Ashok Pall, MD;  Location: [place];  Service: Neurosurgery;  Laterality: Left;  CARPAL TUNNEL RELEASE LEFT   COLONOSCOPY WITH PROPOFOL N/A 11/29/2016   Procedure: COLONOSCOPY WITH PROPOFOL;  Surgeon: [name], MD;  [facility];  Service:  Endoscopy;  Laterality: N/A;   ESOPHAGOGASTRODUODENOSCOPY (EGD) WITH PROPOFOL N/A 11/29/2016   Procedure: ESOPHAGOGASTRODUODENOSCOPY (EGD) WITH PROPOFOL;  Surgeon: [name], MD;  [facility];  Service: Endoscopy;  Laterality: N/A;   IR INJECT/THERA/INC NEEDLE/CATH/PLC EPI/CERV/THOR [facility]  03/04/2020   JOINT REPLACEMENT     knee rotation     LEG TENDON SURGERY     OVARIAN CYST REMOVAL     REPLACEMENT TOTAL KNEE BILATERAL  2006   rotator cuff surgery     Patient Active Problem List   Diagnosis Date Noted   Bilateral shoulder pain 08/09/2022   Neck pain 11/21/2018   Low back pain 08/19/2018   Weakness of both lower extremities 08/19/2018   Pain in finger of left hand 05/28/2018   Pain in right hand 05/28/2018   Pain in left elbow 04/02/2018   Chronic right shoulder pain 07/16/2017   Rotator cuff syndrome, right 05/13/2017   Cervical radiculopathy 01/14/2017   Colon cancer screening    Encounter for screening for gastric cancer    Osteoarthritis of spine with radiculopathy, cervical region 05/25/2016   Postmenopausal bleeding 08/04/2015   Acute respiratory failure ([place]) 07/19/2013   Nocturnal hypoxemia 07/07/2013   UTI (lower urinary tract infection) 07/07/2013   Hypokalemia 07/07/2013   Abdominal pain, acute, right lower quadrant 07/07/2013   Benign paroxysmal  positional vertigo 02/24/2013   Edema of both legs 02/24/2013   Dysplasia of cervix    Melanoma in situ [facility])    Bipolar 1 disorder ([place]) 02/08/2012   IBS 12/04/2007   RASH AND OTHER NONSPECIFIC SKIN ERUPTION 12/04/2007   EMPHYSEMA by CT only 09/05/2007   NECK MASS 09/05/2007   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[place]) 08/13/2007   Cerebral palsy ([place]) 08/13/2007   OTITIS EXTERNA, ACUTE 08/13/2007   IRRITABLE BOWEL SYNDROME, HX OF 08/13/2007   HYPERLIPIDEMIA 05/10/2007   DEPRESSION 05/10/2007   ALLERGIC RHINITIS 05/10/2007    PCP: Cyndi Bender, PA-C  REFERRING PROVIDER: Persons, Bevely Palmer, PA  REFERRING DIAG: [phone]  (ICD-10-CM) - Acute pain of left shoulder; M25.511 (ICD-10-CM) - Acute pain of right shoulder.  THERAPY DIAG:  No diagnosis found.  Rationale for Evaluation and Treatment: Rehabilitation  ONSET DATE: ***  SUBJECTIVE:                                                                                                                                                                                      SUBJECTIVE STATEMENT: ***  PERTINENT HISTORY: ***  PAIN:  Are you having pain? Yes: NPRS scale: ***/10 Pain location: *** Pain description: *** Aggravating factors: *** Relieving factors: ***  PRECAUTIONS: {Therapy precautions:24002}  WEIGHT BEARING RESTRICTIONS: {Yes ***/No:24003}  FALLS:  Has patient fallen in last 6 months? {fallsyesno:27318}  LIVING ENVIRONMENT: Lives with: {[facility] lives with:25569::"lives with their family"} Lives in: {Lives in:25570} Stairs: {opstairs:27293} Has following equipment at home: {Assistive devices:23999}  OCCUPATION: ***  PLOF: {PLOF:24004}  PATIENT GOALS:***  NEXT MD VISIT:   OBJECTIVE:   DIAGNOSTIC FINDINGS:  ***  PATIENT SURVEYS:  {rehab surveys:24030:a}  COGNITION: Overall cognitive status: {cognition:24006}     SENSATION: {sensation:27233}  POSTURE: ***  UPPER EXTREMITY ROM:   {AROM/PROM:27142} ROM Right eval Left eval  Shoulder flexion    Shoulder extension    Shoulder abduction    Shoulder adduction    Shoulder internal rotation    Shoulder external rotation    Elbow flexion    Elbow extension    Wrist flexion    Wrist extension    Wrist ulnar deviation    Wrist radial deviation    Wrist pronation    Wrist supination    (Blank rows = not tested)  UPPER EXTREMITY MMT:  MMT Right eval Left eval  Shoulder flexion    Shoulder extension    Shoulder abduction    Shoulder adduction    Shoulder internal rotation    Shoulder external rotation    Middle trapezius    Lower trapezius    Elbow flexion     Elbow extension  Wrist flexion    Wrist extension    Wrist ulnar deviation    Wrist radial deviation    Wrist pronation    Wrist supination    Grip strength (lbs)    (Blank rows = not tested)  SHOULDER SPECIAL TESTS: Impingement tests: {shoulder impingement test:25231:a} SLAP lesions: {SLAP lesions:25232} Instability tests: {shoulder instability test:25233} Rotator cuff assessment: {rotator cuff assessment:25234} Biceps assessment: {biceps assessment:25235}  JOINT MOBILITY TESTING:  ***  PALPATION:  ***   TODAY'S TREATMENT:                                                                                                                                         DATE: ***   PATIENT EDUCATION: Education details: *** Person educated: {Person educated:25204} Education method: {Education Method:25205} Education comprehension: {Education Comprehension:25206}  HOME EXERCISE PROGRAM: ***  ASSESSMENT:  CLINICAL IMPRESSION: Patient is a 66 y.o. female who was seen today for physical therapy evaluation and treatment for M25.512 (ICD-10-CM) - Acute pain of left shoulder; M25.511 (ICD-10-CM) - Acute pain of right shoulder. .  OBJECTIVE IMPAIRMENTS: {opptimpairments:25111}.   ACTIVITY LIMITATIONS: {activitylimitations:27494}  PARTICIPATION LIMITATIONS: {participationrestrictions:25113}  PERSONAL FACTORS: {Personal factors:25162} are also affecting patient's functional outcome.   REHAB POTENTIAL: {rehabpotential:25112}  CLINICAL DECISION MAKING: {clinical decision making:25114}  EVALUATION COMPLEXITY: {Evaluation complexity:25115}   GOALS: Goals reviewed with patient? {yes/no:20286}  SHORT TERM GOALS: Target date: ***  *** Baseline: Goal status: {GOALSTATUS:25110}  2.  *** Baseline:  Goal status: {GOALSTATUS:25110}  3.  *** Baseline:  Goal status: {GOALSTATUS:25110}  4.  *** Baseline:  Goal status: {GOALSTATUS:25110}  5.  *** Baseline:  Goal status:  {GOALSTATUS:25110}  6.  *** Baseline:  Goal status: {GOALSTATUS:25110}  LONG TERM GOALS: Target date: ***  *** Baseline:  Goal status: {GOALSTATUS:25110}  2.  *** Baseline:  Goal status: {GOALSTATUS:25110}  3.  *** Baseline:  Goal status: {GOALSTATUS:25110}  4.  *** Baseline:  Goal status: {GOALSTATUS:25110}  5.  *** Baseline:  Goal status: {GOALSTATUS:25110}  6.  *** Baseline:  Goal status: {GOALSTATUS:25110}  PLAN:  PT FREQUENCY: {rehab frequency:25116}  PT DURATION: {rehab duration:25117}  PLANNED INTERVENTIONS: {rehab planned interventions:25118::"Therapeutic exercises","Therapeutic activity","Neuromuscular re-education","Balance training","Gait training","Patient/Family education","Self Care","Joint mobilization"}  PLAN FOR NEXT SESSION: ***   Gabriell Casimir, PT 10/04/2022, 9:09 PM

## 2022-10-05 ENCOUNTER — Ambulatory Visit: Payer: Medicare Other

## 2022-10-05 NOTE — Therapy (Signed)
OUTPATIENT PHYSICAL THERAPY SHOULDER EVALUATION   Patient Name: [name] MRN: [id number] DOB:12/05/1955, 66 y.o., female Today's Date: 10/05/2022  END OF SESSION:   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[place])    Anxiety    Arthritis    Bacterial infection    Benign paroxysmal positional vertigo 02/24/2013   Bipolar disorder ([place])    Bladder infection    Cancer ([place])    skin cancer   Cerebral palsy (H[facility])    Colitis    COPD (chronic obstructive pulmonary disease) ([place])    mild emphysema   Depression    bi polar   Diabetes mellitus, type II ([place])    Dysplasia of cervix    GERD (gastroesophageal reflux disease)    Headache(784.0)    High cholesterol    History of chicken pox    History of measles    History of mumps    IBS (irritable bowel syndrome)    Kidney infection    Melanoma in situ ([place])    Neck mass    Neuromuscular disorder (H[facility])    Ovarian cyst    Poor circulation    Stenosis, cervical spine 05/16   Trichomonas    UTI (urinary tract infection)    current   Yeast infection    Past Surgical History:  Procedure Laterality Date   ANTERIOR CERVICAL DECOMP/DISCECTOMY FUSION N/A 05/25/2016   Procedure: Cervical five-six Anterior cervical decompression/diskectomy/fusion;  Surgeon: Ashok Pall, MD;  [facility];  Service: Neurosurgery;  Laterality: N/A;   APPENDECTOMY     CARPAL TUNNEL RELEASE     CARPAL TUNNEL RELEASE Right 08/02/2017   Procedure: CARPAL TUNNEL RELEASE RIGHT;  Surgeon: Ashok Pall, MD;  Location: [place];  Service: Neurosurgery;  Laterality: Right;  CARPAL TUNNEL RELEASE RIGHT   CARPAL TUNNEL RELEASE Left 11/29/2017   Procedure: CARPAL TUNNEL RELEASE LEFT;  Surgeon: Ashok Pall, MD;  Location: [place];  Service: Neurosurgery;  Laterality: Left;  CARPAL TUNNEL RELEASE LEFT   COLONOSCOPY WITH PROPOFOL N/A 11/29/2016   Procedure: COLONOSCOPY WITH PROPOFOL;  Surgeon: [name], MD;  [facility];  Service:  Endoscopy;  Laterality: N/A;   ESOPHAGOGASTRODUODENOSCOPY (EGD) WITH PROPOFOL N/A 11/29/2016   Procedure: ESOPHAGOGASTRODUODENOSCOPY (EGD) WITH PROPOFOL;  Surgeon: [name], MD;  [facility];  Service: Endoscopy;  Laterality: N/A;   IR INJECT/THERA/INC NEEDLE/CATH/PLC EPI/CERV/THOR [facility]  03/04/2020   JOINT REPLACEMENT     knee rotation     LEG TENDON SURGERY     OVARIAN CYST REMOVAL     REPLACEMENT TOTAL KNEE BILATERAL  2006   rotator cuff surgery     Patient Active Problem List   Diagnosis Date Noted   Bilateral shoulder pain 08/09/2022   Neck pain 11/21/2018   Low back pain 08/19/2018   Weakness of both lower extremities 08/19/2018   Pain in finger of left hand 05/28/2018   Pain in right hand 05/28/2018   Pain in left elbow 04/02/2018   Chronic right shoulder pain 07/16/2017   Rotator cuff syndrome, right 05/13/2017   Cervical radiculopathy 01/14/2017   Colon cancer screening    Encounter for screening for gastric cancer    Osteoarthritis of spine with radiculopathy, cervical region 05/25/2016   Postmenopausal bleeding 08/04/2015   Acute respiratory failure ([place]) 07/19/2013   Nocturnal hypoxemia 07/07/2013   UTI (lower urinary tract infection) 07/07/2013   Hypokalemia 07/07/2013   Abdominal pain, acute, right lower quadrant 07/07/2013   Benign paroxysmal  positional vertigo 02/24/2013   Edema of both legs 02/24/2013   Dysplasia of cervix    Melanoma in situ [facility])    Bipolar 1 disorder ([place]) 02/08/2012   IBS 12/04/2007   RASH AND OTHER NONSPECIFIC SKIN ERUPTION 12/04/2007   EMPHYSEMA by CT only 09/05/2007   NECK MASS 09/05/2007   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  Rationale for Evaluation and Treatment: Rehabilitation  ONSET DATE: ***  SUBJECTIVE:                                                                                                                                                                                      SUBJECTIVE STATEMENT: ***  PERTINENT HISTORY: Anxiety, hx cancer, cerebral palsy, BPPV, COPD, DM2, GERD  PAIN:  Are you having pain? {OPRCPAIN:27236}  PRECAUTIONS: {Therapy precautions:24002}  WEIGHT BEARING RESTRICTIONS: {Yes ***/No:24003}  FALLS:  Has patient fallen in last 6 months? {fallsyesno:27318}  LIVING ENVIRONMENT: Lives with: {[facility] lives with:25569::"lives with their family"} Lives in: {Lives in:[address]70} Stairs: {opstairs:27293} Has following equipment at home: {Assistive devices:23999}  OCCUPATION: ***  PLOF: {PLOF:24004}  PATIENT GOALS:***  NEXT MD VISIT:   OBJECTIVE:   DIAGNOSTIC FINDINGS:  XR unremarkable for acute issues B shoulder, some degenerative changes (see EPIC for details)  PATIENT SURVEYS:  FOTO:   COGNITION: Overall cognitive status: Within functional limits for tasks assessed     SENSATION: Light touch intact B UE  POSTURE: ***  UPPER EXTREMITY ROM:  {AROM/PROM:27142} ROM Right eval Left eval  Shoulder flexion    Shoulder abduction    Shoulder internal rotation    Shoulder external rotation    Elbow flexion    Elbow extension    Wrist flexion    Wrist extension     (Blank rows = not tested) Comments:   UPPER EXTREMITY MMT:  MMT Right eval Left eval  Shoulder flexion    Shoulder extension    Shoulder abduction    Shoulder extension    Shoulder internal rotation    Shoulder external rotation    Elbow flexion    Elbow extension    Grip strength     (Blank rows = not tested)   SHOULDER SPECIAL  TESTS: Impingement  tests: {shoulder impingement test:25231:a} SLAP lesions: {SLAP lesions:25232} Instability tests: {shoulder instability test:25233} Rotator cuff assessment: {rotator cuff assessment:25234} Biceps assessment: {biceps assessment:25235}  JOINT MOBILITY TESTING:  ***  PALPATION:  ***   TODAY'S TREATMENT:                                                                                                                                         [facility] Adult PT Treatment:                                                DATE: 10/08/22 Therapeutic Exercise: *** Manual Therapy: *** Neuromuscular re-ed: *** Therapeutic Activity: *** Modalities: *** Self Care: ***  PATIENT EDUCATION: Education details: Pt education on PT impairments, prognosis, and POC. Informed consent. Rationale for interventions, safe/appropriate HEP performance Person educated: Patient Education method: Explanation, Demonstration, Tactile cues, Verbal cues, and Handouts Education comprehension: verbalized understanding, returned demonstration, verbal cues required, tactile cues required, and needs further education    HOME EXERCISE PROGRAM: ***  ASSESSMENT:  CLINICAL IMPRESSION: Patient is a 66 y.o. woman who was seen today for physical therapy evaluation and treatment for bilateral shoulder pain.   OBJECTIVE IMPAIRMENTS: {opptimpairments:25111}.   ACTIVITY LIMITATIONS: {activitylimitations:27494}  PARTICIPATION LIMITATIONS: {participationrestrictions:25113}  PERSONAL FACTORS: {Personal factors:25162} are also affecting patient's functional outcome.   REHAB POTENTIAL: {rehabpotential:25112}  CLINICAL DECISION MAKING: {clinical decision making:25114}  EVALUATION COMPLEXITY: {Evaluation complexity:25115}   GOALS: Goals reviewed with patient? {yes/no:20286}  SHORT TERM GOALS: Target date: ***  Pt will demonstrate appropriate understanding and performance of initially prescribed HEP in order  to facilitate improved independence with management of symptoms.  Baseline: HEP provided on eval Goal status: INITIAL   2. Pt will score greater than or equal to *** on FOTO in order to demonstrate improved perception of function due to symptoms.  Baseline: ***  Goal status: INITIAL     LONG TERM GOALS: Target date: ***  Pt will score *** on FOTO in order to demonstrate improved perception of function due to symptoms. Baseline: *** Goal status: INITIAL  2.  Pt will demonstrate at least *** degrees of active shoulder elevation in order to demonstrate improved tolerance to functional movement patterns such as ***.  Baseline: see ROM chart above Goal status: INITIAL  3.  Pt will demonstrate at least 4+/5 shoulder *** MMT for improved symmetry of UE strength and improved tolerance to functional movements.  Baseline: see MMT chart above Goal status: INITIAL  4. Pt will report/demonstrate ability to perform *** with less than 2 point increase in pain on NPS in order to indicative improved tolerance/independence with functional tasks such as ***.   Baseline: ***  Goal status: INITIAL   PLAN:  PT FREQUENCY: {rehab frequency:25116}  PT DURATION: {rehab duration:25117}  PLANNED INTERVENTIONS: {rehab planned interventions:25118::"Therapeutic exercises","Therapeutic activity","Neuromuscular re-education","Balance training","Gait training","Patient/Family education","Self Care","Joint mobilization"}  PLAN FOR NEXT SESSION: Progress ROM/strengthening exercises as able/appropriate, review HEP.    Leeroy Cha PT, DPT 10/05/2022 2:39 PM

## 2022-10-08 ENCOUNTER — Encounter: Payer: Self-pay | Admitting: Physical Therapy

## 2022-10-08 ENCOUNTER — Ambulatory Visit: Payer: Medicare Other | Attending: Physician Assistant | Admitting: Physical Therapy

## 2022-10-08 ENCOUNTER — Other Ambulatory Visit: Payer: Self-pay

## 2022-10-08 DIAGNOSIS — G8929 Other chronic pain: Secondary | ICD-10-CM | POA: Diagnosis not present

## 2022-10-08 DIAGNOSIS — M25612 Stiffness of left shoulder, not elsewhere classified: Secondary | ICD-10-CM | POA: Diagnosis not present

## 2022-10-08 DIAGNOSIS — M25512 Pain in left shoulder: Secondary | ICD-10-CM | POA: Diagnosis not present

## 2022-10-08 DIAGNOSIS — M6281 Muscle weakness (generalized): Secondary | ICD-10-CM | POA: Insufficient documentation

## 2022-10-08 DIAGNOSIS — M25511 Pain in right shoulder: Secondary | ICD-10-CM | POA: Diagnosis not present

## 2022-10-08 DIAGNOSIS — M25611 Stiffness of right shoulder, not elsewhere classified: Secondary | ICD-10-CM | POA: Insufficient documentation

## 2022-10-15 NOTE — Therapy (Incomplete)
OUTPATIENT PHYSICAL THERAPY TREATMENT NOTE   Patient Name: [name] MRN: [id number] DOB:[birth year], 66 y.o., female Today's Date: 10/15/2022  PCP: Cyndi Bender, PA-C     REFERRING PROVIDER: Persons, Bevely Palmer, PA  END OF SESSION:    Past Medical History:  Diagnosis Date   Acromegaly [facility])    Anxiety    Arthritis    Bacterial infection    Benign paroxysmal positional vertigo 02/24/2013   Bipolar disorder ([place])    Bladder infection    Cancer ([place])    skin cancer   Cerebral palsy (HCC)    Colitis    COPD (chronic obstructive pulmonary disease) ([place])    mild emphysema   Depression    bi polar   Diabetes mellitus, type II ([place])    Dysplasia of cervix    GERD (gastroesophageal reflux disease)    Headache(784.0)    High cholesterol    History of chicken pox    History of measles    History of mumps    IBS (irritable bowel syndrome)    Kidney infection    Melanoma in situ ([place])    Neck mass    Neuromuscular disorder (HCC)    Ovarian cyst    Poor circulation    Stenosis, cervical spine 05/16   Trichomonas    UTI (urinary tract infection)    current   Yeast infection    Past Surgical History:  Procedure Laterality Date   ANTERIOR CERVICAL DECOMP/DISCECTOMY FUSION N/A 05/25/2016   Procedure: Cervical five-six Anterior cervical decompression/diskectomy/fusion;  Surgeon: Ashok Pall, MD;  [facility];  Service: Neurosurgery;  Laterality: N/A;   APPENDECTOMY     CARPAL TUNNEL RELEASE     CARPAL TUNNEL RELEASE Right 08/02/2017   Procedure: CARPAL TUNNEL RELEASE RIGHT;  Surgeon: Ashok Pall, MD;  Location: [place];  Service: Neurosurgery;  Laterality: Right;  CARPAL TUNNEL RELEASE RIGHT   CARPAL TUNNEL RELEASE Left 11/29/2017   Procedure: CARPAL TUNNEL RELEASE LEFT;  Surgeon: Ashok Pall, MD;  Location: [place];  Service: Neurosurgery;  Laterality: Left;  CARPAL TUNNEL RELEASE LEFT   COLONOSCOPY WITH PROPOFOL N/A 11/29/2016   Procedure: COLONOSCOPY WITH  PROPOFOL;  Surgeon: [name], MD;  [facility];  Service: Endoscopy;  Laterality: N/A;   ESOPHAGOGASTRODUODENOSCOPY (EGD) WITH PROPOFOL N/A 11/29/2016   Procedure: ESOPHAGOGASTRODUODENOSCOPY (EGD) WITH PROPOFOL;  Surgeon: [name], MD;  [facility];  Service: Endoscopy;  Laterality: N/A;   IR INJECT/THERA/INC NEEDLE/CATH/PLC EPI/CERV/THOR [facility]  03/04/2020   JOINT REPLACEMENT     knee rotation     LEG TENDON SURGERY     OVARIAN CYST REMOVAL     REPLACEMENT TOTAL KNEE BILATERAL  2006   rotator cuff surgery     Patient Active Problem List   Diagnosis Date Noted   Bilateral shoulder pain 08/09/2022   Neck pain 11/21/2018   Low back pain 08/19/2018   Weakness of both lower extremities 08/19/2018   Pain in finger of left hand 05/28/2018   Pain in right hand 05/28/2018   Pain in left elbow 04/02/2018   Chronic right shoulder pain 07/16/2017   Rotator cuff syndrome, right 05/13/2017   Cervical radiculopathy 01/14/2017   Colon cancer screening    Encounter for screening for gastric cancer    Osteoarthritis of spine with radiculopathy, cervical region 05/25/2016   Postmenopausal bleeding 08/04/2015   Acute respiratory failure ([place]) 07/19/2013   Nocturnal hypoxemia 07/07/2013   UTI (lower urinary tract infection) 07/07/2013  Hypokalemia 07/07/2013   Abdominal pain, acute, right lower quadrant 07/07/2013   Benign paroxysmal positional vertigo 02/24/2013   Edema of both legs 02/24/2013   Dysplasia of cervix    Melanoma in situ [facility])    Bipolar 1 disorder ([place]) 02/08/2012   IBS 12/04/2007   RASH AND OTHER NONSPECIFIC SKIN ERUPTION 12/04/2007   EMPHYSEMA by CT only 09/05/2007   NECK MASS 09/05/2007   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[place]) 08/13/2007   Cerebral palsy ([place]) 08/13/2007   OTITIS EXTERNA, ACUTE 08/13/2007   IRRITABLE BOWEL SYNDROME, HX OF 08/13/2007   HYPERLIPIDEMIA 05/10/2007   DEPRESSION 05/10/2007   ALLERGIC RHINITIS 05/10/2007    REFERRING DIAG:   M25.512 (ICD-10-CM) - Acute pain of left shoulder  M25.511 (ICD-10-CM) - Acute pain of right shoulder    THERAPY DIAG:  No diagnosis found.  Rationale for Evaluation and Treatment Rehabilitation  PERTINENT HISTORY: Anxiety, hx cancer, cerebral palsy, BPPV, COPD, DM2, GERD Not currently following up re: recent hx of skin cancer Denies cardiac issues, previous neck surgery  PRECAUTIONS: fall risk, osteoporosis  PATIENT GOALS: improve transfers, reduce pain   SUBJECTIVE:                                                                                                                                                                                      SUBJECTIVE STATEMENT:  ***   PAIN:  Are you having pain: none  Location: L>R shoulder pain, although is variable How would you describe your pain? Sharp pain Best in past week: 0/10 Worst in past week: 5/10 calms quickly Aggravating factors: pulling up to adjust prior to transfers, during transfers (lateral)  Easing factors: heat    OBJECTIVE: (objective measures completed at initial evaluation unless otherwise dated)   DIAGNOSTIC FINDINGS:  XR unremarkable for acute issues B shoulder, some degenerative changes (see EPIC for details)   PATIENT SURVEYS:  FOTO: *** 10/16/22   COGNITION: Overall cognitive status: Within functional limits for tasks assessed                                  SENSATION: Light touch intact B UE   POSTURE: Significant UT elevation L>R, forward head posture and rounded shoulders, trunk lean to R   UPPER EXTREMITY ROM:   Active ROM Right eval Left eval  Shoulder flexion 110 98  Shoulder abduction      Shoulder internal rotation      Shoulder external rotation C7 C5  Elbow flexion      Elbow extension      Wrist flexion      Wrist extension       (  Blank rows = not tested) Comments:    UPPER EXTREMITY MMT:   MMT Right eval Left eval  Shoulder flexion 4- p! 4- p!  Shoulder extension       Shoulder abduction 4- p! 4- p!  Shoulder extension      Shoulder internal rotation      Shoulder external rotation      Elbow flexion      Elbow extension      Grip strength       (Blank rows = not tested)      PALPATION:  Notable tightness B periscapular musculature, tenderness along supraspinatus B. Tenderness along AC joint on R.  Pt mentions "lump" on RUE that has been present for about a year - relatively firm mass noted, tender to touch. No erythema. Present along superior aspect of mid clavicle.             TODAY'S TREATMENT:                                                                                                   [facility] Adult PT Treatment:                                                DATE: 10/16/22 Therapeutic Exercise: *** Manual Therapy: *** Neuromuscular re-ed: *** Therapeutic Activity: *** Modalities: *** Self Care: ***                                        [facility] Adult PT Treatment:                                                DATE: 10/08/22 Therapeutic Exercise: Scapular retraction x10 cues for form and HEP Shoulder shrugs x10 cues for form and HEP   PATIENT EDUCATION: Education details: Pt education on PT impairments, prognosis, and POC. Informed consent. Rationale for interventions, safe/appropriate HEP performance. Follow up with provider over pt concern re: "lump" Person educated: Patient Education method: Explanation, Demonstration, Tactile cues, Verbal cues, and Handouts Education comprehension: verbalized understanding, returned demonstration, verbal cues required, tactile cues required, and needs further education     HOME EXERCISE PROGRAM: Access Code: [email or web address] URL: [email or web address] Date: 10/08/2022 Prepared by: Enis Slipper   Exercises - Seated Scapular Retraction  - 1 x daily - 7 x weekly - 3 sets - 10 reps - Seated Shoulder Shrug  - 1 x daily - 7 x weekly - 3 sets - 10 reps   ASSESSMENT:   CLINICAL  IMPRESSION: 10/15/2022 ***   Eval: Patient is a 66 y.o. woman who was seen today for physical therapy evaluation and treatment for bilateral shoulder pain ongoing for several years but worsening over past year. Pt notes  a "lump" on R shoulder - see above for examination. Pt denies any other red flags, states it has been present for about a year without changing in size/irritability. However, given location, medical history, and tenderness to touch, did communicate with referring provider via secure chart to notify them and encouraged pt to call for follow up.  Pt reports most difficulty with lateral scoot transfers and endorses multiple falls due to pain/weakness. On exam pt demonstrates limitations in [facility] strength/mobility, postural deficits that are likely contributing to symptoms. Mild transient discomfort w/ HEP near end of sets but denies any increase in resting pain, education on appropriate performance and modification at home as needed. No adverse events, pt tolerates session well overall, significant time spent discussing PT exam findings and pt concern over "lump". Recommend skilled PT to address aforementioned deficits to minimize fall risk and maximize functional tolerance. Pt departs today's session in no acute distress, all voiced questions/concerns addressed appropriately from PT perspective.     OBJECTIVE IMPAIRMENTS: decreased activity tolerance, decreased endurance, decreased mobility, decreased ROM, decreased strength, hypomobility, impaired flexibility, impaired UE functional use, improper body mechanics, postural dysfunction, and pain.    ACTIVITY LIMITATIONS: sitting, transfers, bathing, toileting, dressing, reach over head, and hygiene/grooming   PARTICIPATION LIMITATIONS: meal prep and cleaning   PERSONAL FACTORS: Time since onset of injury/illness/exacerbation and 3+ comorbidities: Anxiety, hx cancer, cerebral palsy, COPD, DM2, GERD  are also affecting patient's functional  outcome.    REHAB POTENTIAL: Fair given chronicity and comorbidities   CLINICAL DECISION MAKING: Evolving/moderate complexity   EVALUATION COMPLEXITY: Moderate     GOALS: Goals reviewed with patient? No   SHORT TERM GOALS: Target date: 11/05/2022     Pt will demonstrate appropriate understanding and performance of initially prescribed HEP in order to facilitate improved independence with management of symptoms.  Baseline: HEP provided on eval Goal status: INITIAL    2. Pt will improve greater than 1x MCID on FOTO in order to demonstrate improved perception of function due to symptoms.            Baseline: deferred on eval d/t time constraints            Goal status: INITIAL        LONG TERM GOALS: Target date: 12/03/2022     Pt will improve greater than 2x MCID on FOTO in order to demonstrate improved perception of function due to symptoms. Baseline: deferred on eval given time constraints Goal status: INITIAL   2.  Pt will demonstrate at least 140 degrees of active shoulder elevation bilaterally in order to demonstrate improved tolerance to functional movement patterns such as upper body dressing and ADLs.  Baseline: see ROM chart above Goal status: INITIAL   3.  Pt will demonstrate at least 4+/5 shoulder flex/abduction MMT for improved symmetry of UE strength and improved tolerance to functional movements such as transfers. Baseline: see MMT chart above Goal status: INITIAL   4. Pt will report/demonstrate ability to perform lateral scoot transfers with less than 2 point increase in pain on NPS in order to indicate improved safety w/ mobility and reduced fall risk            Baseline: up to 5/10 pain with transfers, 6 falls in past six months reported            Goal status: INITIAL    PLAN:   PT FREQUENCY: 1-2x/week   PT DURATION: 8 weeks   PLANNED INTERVENTIONS: Therapeutic exercises, Therapeutic activity, Neuromuscular  re-education, Balance training, Gait training,  Patient/Family education, Self Care, Joint mobilization, DME instructions, Cryotherapy, Taping, Manual therapy, and Re-evaluation   PLAN FOR NEXT SESSION: Progress ROM/strengthening exercises as able/appropriate, review HEP.   Leeroy Cha PT, DPT 10/15/2022 12:17 PM

## 2022-10-16 ENCOUNTER — Ambulatory Visit: Payer: Medicare Other | Admitting: Physical Therapy

## 2022-10-19 DIAGNOSIS — R350 Frequency of micturition: Secondary | ICD-10-CM | POA: Diagnosis not present

## 2022-10-19 DIAGNOSIS — N319 Neuromuscular dysfunction of bladder, unspecified: Secondary | ICD-10-CM | POA: Diagnosis not present

## 2022-10-19 DIAGNOSIS — N3020 Other chronic cystitis without hematuria: Secondary | ICD-10-CM | POA: Diagnosis not present

## 2022-10-19 DIAGNOSIS — E118 Type 2 diabetes mellitus with unspecified complications: Secondary | ICD-10-CM | POA: Diagnosis not present

## 2022-10-19 NOTE — Therapy (Incomplete)
OUTPATIENT PHYSICAL THERAPY TREATMENT NOTE   Patient Name: [name] MRN: [id number] DOB:03-21-1956, 66 y.o., female Today's Date: 10/19/2022  PCP: Cyndi Bender, PA-C     REFERRING PROVIDER: Persons, Bevely Palmer, PA  END OF SESSION:    Past Medical History:  Diagnosis Date   Acromegaly [facility])    Anxiety    Arthritis    Bacterial infection    Benign paroxysmal positional vertigo 02/24/2013   Bipolar disorder ([place])    Bladder infection    Cancer ([place])    skin cancer   Cerebral palsy (HCC)    Colitis    COPD (chronic obstructive pulmonary disease) ([place])    mild emphysema   Depression    bi polar   Diabetes mellitus, type II ([place])    Dysplasia of cervix    GERD (gastroesophageal reflux disease)    Headache(784.0)    High cholesterol    History of chicken pox    History of measles    History of mumps    IBS (irritable bowel syndrome)    Kidney infection    Melanoma in situ ([place])    Neck mass    Neuromuscular disorder (HCC)    Ovarian cyst    Poor circulation    Stenosis, cervical spine 05/16   Trichomonas    UTI (urinary tract infection)    current   Yeast infection    Past Surgical History:  Procedure Laterality Date   ANTERIOR CERVICAL DECOMP/DISCECTOMY FUSION N/A 05/25/2016   Procedure: Cervical five-six Anterior cervical decompression/diskectomy/fusion;  Surgeon: Ashok Pall, MD;  [facility];  Service: Neurosurgery;  Laterality: N/A;   APPENDECTOMY     CARPAL TUNNEL RELEASE     CARPAL TUNNEL RELEASE Right 08/02/2017   Procedure: CARPAL TUNNEL RELEASE RIGHT;  Surgeon: Ashok Pall, MD;  Location: [place];  Service: Neurosurgery;  Laterality: Right;  CARPAL TUNNEL RELEASE RIGHT   CARPAL TUNNEL RELEASE Left 11/29/2017   Procedure: CARPAL TUNNEL RELEASE LEFT;  Surgeon: Ashok Pall, MD;  Location: [place];  Service: Neurosurgery;  Laterality: Left;  CARPAL TUNNEL RELEASE LEFT   COLONOSCOPY WITH PROPOFOL N/A 11/29/2016   Procedure: COLONOSCOPY WITH  PROPOFOL;  Surgeon: [name], MD;  [facility];  Service: Endoscopy;  Laterality: N/A;   ESOPHAGOGASTRODUODENOSCOPY (EGD) WITH PROPOFOL N/A 11/29/2016   Procedure: ESOPHAGOGASTRODUODENOSCOPY (EGD) WITH PROPOFOL;  Surgeon: [name], MD;  [facility];  Service: Endoscopy;  Laterality: N/A;   IR INJECT/THERA/INC NEEDLE/CATH/PLC EPI/CERV/THOR [facility]  03/04/2020   JOINT REPLACEMENT     knee rotation     LEG TENDON SURGERY     OVARIAN CYST REMOVAL     REPLACEMENT TOTAL KNEE BILATERAL  2006   rotator cuff surgery     Patient Active Problem List   Diagnosis Date Noted   Bilateral shoulder pain 08/09/2022   Neck pain 11/21/2018   Low back pain 08/19/2018   Weakness of both lower extremities 08/19/2018   Pain in finger of left hand 05/28/2018   Pain in right hand 05/28/2018   Pain in left elbow 04/02/2018   Chronic right shoulder pain 07/16/2017   Rotator cuff syndrome, right 05/13/2017   Cervical radiculopathy 01/14/2017   Colon cancer screening    Encounter for screening for gastric cancer    Osteoarthritis of spine with radiculopathy, cervical region 05/25/2016   Postmenopausal bleeding 08/04/2015   Acute respiratory failure ([place]) 07/19/2013   Nocturnal hypoxemia 07/07/2013   UTI (lower urinary tract infection) 07/07/2013  Hypokalemia 07/07/2013   Abdominal pain, acute, right lower quadrant 07/07/2013   Benign paroxysmal positional vertigo 02/24/2013   Edema of both legs 02/24/2013   Dysplasia of cervix    Melanoma in situ [facility])    Bipolar 1 disorder ([place]) 02/08/2012   IBS 12/04/2007   RASH AND OTHER NONSPECIFIC SKIN ERUPTION 12/04/2007   EMPHYSEMA by CT only 09/05/2007   NECK MASS 09/05/2007   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  Rationale for Evaluation and Treatment Rehabilitation  PERTINENT HISTORY: Anxiety, hx cancer, cerebral palsy, BPPV, COPD, DM2, GERD Not currently following up re: recent hx of skin cancer Denies cardiac issues, previous neck surgery  PRECAUTIONS: fall risk, osteoporosis  PATIENT GOALS: improve transfers, reduce pain   SUBJECTIVE:                                                                                                                                                                                      SUBJECTIVE STATEMENT:  ***   PAIN:  Are you having pain: none  Location: L>R shoulder pain, although is variable How would you describe your pain? Sharp pain Best in past week: 0/10 Worst in past week: 5/10 calms quickly Aggravating factors: pulling up to adjust prior to transfers, during transfers (lateral)  Easing factors: heat    OBJECTIVE: (objective measures completed at initial evaluation unless otherwise dated)   DIAGNOSTIC FINDINGS:  XR unremarkable for acute issues B shoulder, some degenerative changes (see EPIC for details)   PATIENT SURVEYS:  FOTO: *** 10/16/22   COGNITION: Overall cognitive status: Within functional limits for tasks assessed                                  SENSATION: Light touch intact B UE   POSTURE: Significant UT elevation L>R, forward head posture and rounded shoulders, trunk lean to R   UPPER EXTREMITY ROM:   Active ROM Right eval Left eval  Shoulder flexion 110 98  Shoulder abduction      Shoulder internal rotation      Shoulder external rotation C7 C5  Elbow flexion      Elbow extension      Wrist flexion      Wrist extension       (  Blank rows = not tested) Comments:    UPPER EXTREMITY MMT:   MMT Right eval Left eval  Shoulder flexion 4- p! 4- p!  Shoulder extension       Shoulder abduction 4- p! 4- p!  Shoulder extension      Shoulder internal rotation      Shoulder external rotation      Elbow flexion      Elbow extension      Grip strength       (Blank rows = not tested)      PALPATION:  Notable tightness B periscapular musculature, tenderness along supraspinatus B. Tenderness along AC joint on R.  Pt mentions "lump" on RUE that has been present for about a year - relatively firm mass noted, tender to touch. No erythema. Present along superior aspect of mid clavicle.             TODAY'S TREATMENT:                                                                                                   [place] [facility] Adult PT Treatment:                                                DATE: 10/23/22 Therapeutic Exercise: *** Manual Therapy: *** Neuromuscular re-ed: *** Therapeutic Activity: *** Modalities: *** Self Care: ***                                        [facility] Adult PT Treatment:                                                DATE: 10/08/22 Therapeutic Exercise: Scapular retraction x10 cues for form and HEP Shoulder shrugs x10 cues for form and HEP   PATIENT EDUCATION: Education details: Pt education on PT impairments, prognosis, and POC. Informed consent. Rationale for interventions, safe/appropriate HEP performance. Follow up with provider over pt concern re: "lump" Person educated: Patient Education method: Explanation, Demonstration, Tactile cues, Verbal cues, and Handouts Education comprehension: verbalized understanding, returned demonstration, verbal cues required, tactile cues required, and needs further education     HOME EXERCISE PROGRAM: Access Code: [email or web address] URL: [email or web address] Date: 10/08/2022 Prepared by: Enis Slipper   Exercises - Seated Scapular Retraction  - 1 x daily - 7 x weekly - 3 sets - 10 reps - Seated Shoulder Shrug  - 1 x daily - 7 x weekly - 3 sets - 10 reps   ASSESSMENT:   CLINICAL  IMPRESSION: 10/19/2022 ***   Eval: Patient is a 66 y.o. woman who was seen today for physical therapy evaluation and treatment for bilateral shoulder pain ongoing for several years but worsening over past year. Pt notes  a "lump" on R shoulder - see above for examination. Pt denies any other red flags, states it has been present for about a year without changing in size/irritability. However, given location, medical history, and tenderness to touch, did communicate with referring provider via secure chart to notify them and encouraged pt to call for follow up.  Pt reports most difficulty with lateral scoot transfers and endorses multiple falls due to pain/weakness. On exam pt demonstrates limitations in [facility] strength/mobility, postural deficits that are likely contributing to symptoms. Mild transient discomfort w/ HEP near end of sets but denies any increase in resting pain, education on appropriate performance and modification at home as needed. No adverse events, pt tolerates session well overall, significant time spent discussing PT exam findings and pt concern over "lump". Recommend skilled PT to address aforementioned deficits to minimize fall risk and maximize functional tolerance. Pt departs today's session in no acute distress, all voiced questions/concerns addressed appropriately from PT perspective.     OBJECTIVE IMPAIRMENTS: decreased activity tolerance, decreased endurance, decreased mobility, decreased ROM, decreased strength, hypomobility, impaired flexibility, impaired UE functional use, improper body mechanics, postural dysfunction, and pain.    ACTIVITY LIMITATIONS: sitting, transfers, bathing, toileting, dressing, reach over head, and hygiene/grooming   PARTICIPATION LIMITATIONS: meal prep and cleaning   PERSONAL FACTORS: Time since onset of injury/illness/exacerbation and 3+ comorbidities: Anxiety, hx cancer, cerebral palsy, COPD, DM2, GERD  are also affecting patient's functional  outcome.    REHAB POTENTIAL: Fair given chronicity and comorbidities   CLINICAL DECISION MAKING: Evolving/moderate complexity   EVALUATION COMPLEXITY: Moderate     GOALS: Goals reviewed with patient? No   SHORT TERM GOALS: Target date: 11/05/2022     Pt will demonstrate appropriate understanding and performance of initially prescribed HEP in order to facilitate improved independence with management of symptoms.  Baseline: HEP provided on eval Goal status: INITIAL    2. Pt will improve greater than 1x MCID on FOTO in order to demonstrate improved perception of function due to symptoms.            Baseline: deferred on eval d/t time constraints            Goal status: INITIAL        LONG TERM GOALS: Target date: 12/03/2022     Pt will improve greater than 2x MCID on FOTO in order to demonstrate improved perception of function due to symptoms. Baseline: deferred on eval given time constraints Goal status: INITIAL   2.  Pt will demonstrate at least 140 degrees of active shoulder elevation bilaterally in order to demonstrate improved tolerance to functional movement patterns such as upper body dressing and ADLs.  Baseline: see ROM chart above Goal status: INITIAL   3.  Pt will demonstrate at least 4+/5 shoulder flex/abduction MMT for improved symmetry of UE strength and improved tolerance to functional movements such as transfers. Baseline: see MMT chart above Goal status: INITIAL   4. Pt will report/demonstrate ability to perform lateral scoot transfers with less than 2 point increase in pain on NPS in order to indicate improved safety w/ mobility and reduced fall risk            Baseline: up to 5/10 pain with transfers, 6 falls in past six months reported            Goal status: INITIAL    PLAN:   PT FREQUENCY: 1-2x/week   PT DURATION: 8 weeks   PLANNED INTERVENTIONS: Therapeutic exercises, Therapeutic activity, Neuromuscular  re-education, Balance training, Gait training,  Patient/Family education, Self Care, Joint mobilization, DME instructions, Cryotherapy, Taping, Manual therapy, and Re-evaluation   PLAN FOR NEXT SESSION: Progress ROM/strengthening exercises as able/appropriate, review HEP.   Leeroy Cha PT, DPT 10/19/2022 8:54 AM

## 2022-10-23 ENCOUNTER — Ambulatory Visit: Payer: Medicare Other | Admitting: Physical Therapy

## 2022-10-24 ENCOUNTER — Other Ambulatory Visit (HOSPITAL_COMMUNITY): Payer: Self-pay | Admitting: Psychiatry

## 2022-10-24 DIAGNOSIS — F319 Bipolar disorder, unspecified: Secondary | ICD-10-CM

## 2022-10-24 DIAGNOSIS — F419 Anxiety disorder, unspecified: Secondary | ICD-10-CM

## 2022-10-30 ENCOUNTER — Ambulatory Visit: Payer: 59 | Attending: Physician Assistant | Admitting: Physical Therapy

## 2022-10-30 ENCOUNTER — Encounter: Payer: Self-pay | Admitting: Physical Therapy

## 2022-10-30 DIAGNOSIS — M6281 Muscle weakness (generalized): Secondary | ICD-10-CM | POA: Diagnosis not present

## 2022-10-30 DIAGNOSIS — M25611 Stiffness of right shoulder, not elsewhere classified: Secondary | ICD-10-CM | POA: Insufficient documentation

## 2022-10-30 DIAGNOSIS — M25512 Pain in left shoulder: Secondary | ICD-10-CM | POA: Insufficient documentation

## 2022-10-30 DIAGNOSIS — M25612 Stiffness of left shoulder, not elsewhere classified: Secondary | ICD-10-CM | POA: Insufficient documentation

## 2022-10-30 DIAGNOSIS — M25511 Pain in right shoulder: Secondary | ICD-10-CM | POA: Diagnosis not present

## 2022-10-30 DIAGNOSIS — G8929 Other chronic pain: Secondary | ICD-10-CM | POA: Diagnosis not present

## 2022-10-30 NOTE — Therapy (Addendum)
OUTPATIENT PHYSICAL THERAPY TREATMENT NOTE + NO VISIT DISCHARGE (see below)   Patient Name: [name] MRN: [id number] DOB:19-Jul-1956, 67 y.o., female Today's Date: 10/30/2022  PCP: Cyndi Bender, PA-C     REFERRING PROVIDER: Persons, Bevely Palmer, [place]  END OF SESSION:   PT End of Session - 10/30/22 1313     Visit Number 2    Number of Visits 13   1-2x/week   Date for PT Re-Evaluation 12/03/22    Authorization Type [employer] MDC/MCD    Progress Note Due on Visit 10    PT Start Time 1320    PT Stop Time 1400    PT Time Calculation (min) 40 min    Activity Tolerance Patient tolerated treatment well;No increased pain    Behavior During Therapy [facility] for tasks assessed/performed             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)    Dysplasia of cervix    GERD (gastroesophageal reflux disease)    Headache(784.0)    High cholesterol    History of chicken pox    History of measles    History of mumps    IBS (irritable bowel syndrome)    Kidney infection    Melanoma in situ ([place])    Neck mass    Neuromuscular disorder (HCC)    Ovarian cyst    Poor circulation    Stenosis, cervical spine 05/16   Trichomonas    UTI (urinary tract infection)    current   Yeast infection    Past Surgical History:  Procedure Laterality Date   ANTERIOR CERVICAL DECOMP/DISCECTOMY FUSION N/A 05/25/2016   Procedure: Cervical five-six Anterior cervical decompression/diskectomy/fusion;  Surgeon: Ashok Pall, MD;  Location: [facility];  Service: Neurosurgery;  Laterality: N/A;   APPENDECTOMY     CARPAL TUNNEL RELEASE     CARPAL  TUNNEL RELEASE Right 08/02/2017   Procedure: CARPAL TUNNEL RELEASE RIGHT;  Surgeon: Ashok Pall, MD;  Location: [place];  Service: Neurosurgery;  Laterality: Right;  CARPAL TUNNEL RELEASE RIGHT   CARPAL TUNNEL RELEASE Left 11/29/2017   Procedure: CARPAL TUNNEL RELEASE LEFT;  Surgeon: Ashok Pall, MD;  Location: [place];  Service: Neurosurgery;  Laterality: Left;  CARPAL TUNNEL RELEASE LEFT   COLONOSCOPY WITH PROPOFOL N/A 11/29/2016   Procedure: COLONOSCOPY WITH PROPOFOL;  Surgeon: [name], MD;  Location: [facility];  Service: Endoscopy;  Laterality: N/A;   ESOPHAGOGASTRODUODENOSCOPY (EGD) WITH PROPOFOL N/A 11/29/2016   Procedure: ESOPHAGOGASTRODUODENOSCOPY (EGD) WITH PROPOFOL;  Surgeon: [name], MD;  Location: [facility];  Service: Endoscopy;  Laterality: N/A;   IR INJECT/THERA/INC NEEDLE/CATH/PLC EPI/CERV/THOR [facility]  03/04/2020   JOINT REPLACEMENT     knee rotation     LEG TENDON SURGERY     OVARIAN CYST REMOVAL     REPLACEMENT TOTAL KNEE BILATERAL  2006   rotator cuff surgery     Patient Active Problem List   Diagnosis Date Noted   Bilateral shoulder pain 08/09/2022   Neck pain  11/21/2018   Low back pain 08/19/2018   Weakness of both lower extremities 08/19/2018   Pain in finger of left hand 05/28/2018   Pain in right hand 05/28/2018   Pain in left elbow 04/02/2018   Chronic right shoulder pain 07/16/2017   Rotator cuff syndrome, right 05/13/2017   Cervical radiculopathy 01/14/2017   Colon cancer screening    Encounter for screening for gastric cancer    Osteoarthritis of spine with radiculopathy, cervical region 05/25/2016   Postmenopausal bleeding 08/04/2015   Acute respiratory failure ([place]) 07/19/2013   Nocturnal hypoxemia 07/07/2013   UTI (lower urinary tract infection) 07/07/2013   Hypokalemia 07/07/2013   Abdominal pain, acute, right lower quadrant 07/07/2013   Benign paroxysmal positional vertigo 02/24/2013   Edema of both legs 02/24/2013   Dysplasia of  cervix    Melanoma in situ [facility])    Bipolar 1 disorder ([place]) 02/08/2012   IBS 12/04/2007   RASH AND OTHER NONSPECIFIC SKIN ERUPTION 12/04/2007   EMPHYSEMA by CT only 09/05/2007   NECK MASS 09/05/2007   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[place]) 08/13/2007   Cerebral palsy ([place]) 08/13/2007   OTITIS EXTERNA, ACUTE 08/13/2007   IRRITABLE BOWEL SYNDROME, HX OF 08/13/2007   HYPERLIPIDEMIA 05/10/2007   DEPRESSION 05/10/2007   ALLERGIC RHINITIS 05/10/2007    REFERRING DIAG:  M25.512 (ICD-10-CM) - Acute pain of left shoulder  M25.511 (ICD-10-CM) - Acute pain of right shoulder    THERAPY DIAG:  Chronic left shoulder pain  Chronic right shoulder pain  Stiffness of right shoulder, not elsewhere classified  Stiffness of left shoulder, not elsewhere classified  Muscle weakness (generalized)  Rationale for Evaluation and Treatment Rehabilitation  PERTINENT HISTORY: Anxiety, hx cancer, cerebral palsy, BPPV, COPD, DM2, GERD Not currently following up re: recent hx of skin cancer Denies cardiac issues, previous neck surgery  PRECAUTIONS: fall risk, osteoporosis  PATIENT GOALS: improve transfers, reduce pain   SUBJECTIVE:                                                                                                                                                                                      SUBJECTIVE STATEMENT:    Pt arrives w/o significant pain, notes that she has had to miss appointments due to difficulty w/ transportation. Reports limited adherence to HEP although she notes shoulders have been doing much better since initial evaluation. PAIN:  Are you having pain: none  Location: L>R shoulder pain, although is variable How would you describe your pain? Sharp pain Best in past week: 0/10 Worst in past week: 5/10 calms quickly Aggravating factors: pulling up to adjust prior to transfers, during transfers (lateral)  Easing factors: heat  OBJECTIVE: (objective measures completed at initial  evaluation unless otherwise dated)   DIAGNOSTIC FINDINGS:  XR unremarkable for acute issues B shoulder, some degenerative changes (see EPIC for details)   PATIENT SURVEYS:  FOTO: 56, 60 predicted 10/30/22   COGNITION: Overall cognitive status: Within functional limits for tasks assessed                                  SENSATION: Light touch intact B UE   POSTURE: Significant UT elevation L>R, forward head posture and rounded shoulders, trunk lean to R   UPPER EXTREMITY ROM:   Active ROM Right eval Left eval R/L AROM 10/30/22  Shoulder flexion 110 98 162/135  Shoulder abduction       Shoulder internal rotation       Shoulder external rotation C7 C5   Elbow flexion       Elbow extension       Wrist flexion       Wrist extension        (Blank rows = not tested) Comments:    UPPER EXTREMITY MMT:   MMT Right eval Left eval  Shoulder flexion 4- p! 4- p!  Shoulder extension      Shoulder abduction 4- p! 4- p!  Shoulder extension      Shoulder internal rotation      Shoulder external rotation      Elbow flexion      Elbow extension      Grip strength       (Blank rows = not tested)      PALPATION:  Notable tightness B periscapular musculature, tenderness along supraspinatus B. Tenderness along AC joint on R.  Pt mentions "lump" on RUE that has been present for about a year - relatively firm mass noted, tender to touch. No erythema. Present along superior aspect of mid clavicle.             TODAY'S TREATMENT:                                                                                                   [facility] Adult PT Treatment:                                                DATE: 10/30/22 Therapeutic Exercise: Scap retraction x10 Shoulder shrugs x10 Scap retraction + double ER RTB 2x8 RTB row seated, anchored by PT 2x8 Seated finger ladders 8 laps each UE cues for form and elbow positioning  HEP review/updates  Therapeutic Activity: FOTO + education [place] assessment  + education                                    [facility] Adult PT Treatment:  DATE: 10/08/22 Therapeutic Exercise: Scapular retraction x10 cues for form and HEP Shoulder shrugs x10 cues for form and HEP   PATIENT EDUCATION: Education details: HEP review, rationale for interventions Person educated: Patient Education method: Explanation, Demonstration, Tactile cues, Verbal cues, and Handouts Education comprehension: verbalized understanding, returned demonstration, verbal cues required, tactile cues required, and needs further education     HOME EXERCISE PROGRAM: Access Code: [email or web address] URL: [email or web address] Date: 10/30/2022 Prepared by: Enis Slipper  Exercises - Seated Scapular Retraction  - 1 x daily - 7 x weekly - 3 sets - 10 reps - Seated Shoulder Shrug  - 1 x daily - 7 x weekly - 3 sets - 10 reps - Shoulder External Rotation and Scapular Retraction with Resistance  - 1 x daily - 7 x weekly - 2 sets - 8 reps   ASSESSMENT:   CLINICAL IMPRESSION: 10/30/2022 Pt arrives w/o significant change in symptoms. Session initiated w/ FOTO which was scored at 39. Pt does endorse improvement in symptoms since initial evaluation which is supported by significant increase in B [place] AROM as above. Followed with HEP review and progression for addition of theraband exercises. Pt denies any overt increases in pain as session goes on,  cues as above. Remains limited by reduced [place] mobility and strength B, recommend skilled PT to address. No adverse events, pt reports no resting pain but does endorse muscular fatigue. Pt departs today's session in no acute distress, all voiced questions/concerns addressed appropriately from PT perspective.    Eval: Patient is a 67 y.o. woman who was seen today for physical therapy evaluation and treatment for bilateral shoulder pain ongoing for several years but worsening over past year. Pt notes a "lump" on R shoulder  - see above for examination. Pt denies any other red flags, states it has been present for about a year without changing in size/irritability. However, given location, medical history, and tenderness to touch, did communicate with referring provider via secure chart to notify them and encouraged pt to call for follow up.  Pt reports most difficulty with lateral scoot transfers and endorses multiple falls due to pain/weakness. On exam pt demonstrates limitations in [facility] strength/mobility, postural deficits that are likely contributing to symptoms. Mild transient discomfort w/ HEP near end of sets but denies any increase in resting pain, education on appropriate performance and modification at home as needed. No adverse events, pt tolerates session well overall, significant time spent discussing PT exam findings and pt concern over "lump". Recommend skilled PT to address aforementioned deficits to minimize fall risk and maximize functional tolerance. Pt departs today's session in no acute distress, all voiced questions/concerns addressed appropriately from PT perspective.     OBJECTIVE IMPAIRMENTS: decreased activity tolerance, decreased endurance, decreased mobility, decreased ROM, decreased strength, hypomobility, impaired flexibility, impaired UE functional use, improper body mechanics, postural dysfunction, and pain.    ACTIVITY LIMITATIONS: sitting, transfers, bathing, toileting, dressing, reach over head, and hygiene/grooming   PARTICIPATION LIMITATIONS: meal prep and cleaning   PERSONAL FACTORS: Time since onset of injury/illness/exacerbation and 3+ comorbidities: Anxiety, hx cancer, cerebral palsy, COPD, DM2, GERD  are also affecting patient's functional outcome.    REHAB POTENTIAL: Fair given chronicity and comorbidities   CLINICAL DECISION MAKING: Evolving/moderate complexity   EVALUATION COMPLEXITY: Moderate     GOALS: Goals reviewed with patient? No   SHORT TERM GOALS: Target date:  11/05/2022     Pt will demonstrate appropriate understanding and performance of initially prescribed HEP in order to facilitate improved  independence with management of symptoms.  Baseline: HEP provided on eval Goal status: INITIAL    2. Pt will improve greater than 1x MCID on FOTO in order to demonstrate improved perception of function due to symptoms.            Baseline: deferred on eval d/t time constraints            Goal status: INITIAL        LONG TERM GOALS: Target date: 12/03/2022     Pt will improve greater than 2x MCID on FOTO in order to demonstrate improved perception of function due to symptoms. Baseline: deferred on eval given time constraints Goal status: INITIAL   2.  Pt will demonstrate at least 140 degrees of active shoulder elevation bilaterally in order to demonstrate improved tolerance to functional movement patterns such as upper body dressing and ADLs.  Baseline: see ROM chart above Goal status: INITIAL   3.  Pt will demonstrate at least 4+/5 shoulder flex/abduction MMT for improved symmetry of UE strength and improved tolerance to functional movements such as transfers. Baseline: see MMT chart above Goal status: INITIAL   4. Pt will report/demonstrate ability to perform lateral scoot transfers with less than 2 point increase in pain on NPS in order to indicate improved safety w/ mobility and reduced fall risk            Baseline: up to 5/10 pain with transfers, 6 falls in past six months reported            Goal status: INITIAL    PLAN:   PT FREQUENCY: 1-2x/week   PT DURATION: 8 weeks   PLANNED INTERVENTIONS: Therapeutic exercises, Therapeutic activity, Neuromuscular re-education, Balance training, Gait training, Patient/Family education, Self Care, Joint mobilization, DME instructions, Cryotherapy, Taping, Manual therapy, and Re-evaluation   PLAN FOR NEXT SESSION: Progress ROM/strengthening exercises as able/appropriate, review HEP.   Leeroy Cha  PT, DPT 10/30/2022 2:02 PM    Addendum:   PHYSICAL THERAPY DISCHARGE SUMMARY  Visits from Start of Care: 2  Current functional level related to goals / functional outcomes: Unable to assess    Remaining deficits: Unknown - see above for most recent assessment    Education / Equipment: Unable to assess    Patient unable to agree to discharge due to lack of follow up. Patient goals were  unable to be assessed . Patient is being discharged due to not returning since the last visit.   Leeroy Cha PT, DPT 01/01/2023 2:09 PM

## 2022-11-05 ENCOUNTER — Telehealth (HOSPITAL_COMMUNITY): Payer: Self-pay

## 2022-11-06 ENCOUNTER — Encounter: Payer: Medicare Other | Admitting: Physical Therapy

## 2022-11-06 NOTE — Telephone Encounter (Signed)
I did not see any message.

## 2022-11-08 DIAGNOSIS — N3021 Other chronic cystitis with hematuria: Secondary | ICD-10-CM | POA: Diagnosis not present

## 2022-11-08 DIAGNOSIS — R3914 Feeling of incomplete bladder emptying: Secondary | ICD-10-CM | POA: Diagnosis not present

## 2022-11-09 DIAGNOSIS — N3021 Other chronic cystitis with hematuria: Secondary | ICD-10-CM | POA: Diagnosis not present

## 2022-11-09 DIAGNOSIS — R3914 Feeling of incomplete bladder emptying: Secondary | ICD-10-CM | POA: Diagnosis not present

## 2022-11-29 DIAGNOSIS — N3021 Other chronic cystitis with hematuria: Secondary | ICD-10-CM | POA: Diagnosis not present

## 2022-11-29 DIAGNOSIS — R3914 Feeling of incomplete bladder emptying: Secondary | ICD-10-CM | POA: Diagnosis not present

## 2022-11-30 DIAGNOSIS — R3914 Feeling of incomplete bladder emptying: Secondary | ICD-10-CM | POA: Diagnosis not present

## 2022-11-30 DIAGNOSIS — N3021 Other chronic cystitis with hematuria: Secondary | ICD-10-CM | POA: Diagnosis not present

## 2022-12-04 DIAGNOSIS — R3914 Feeling of incomplete bladder emptying: Secondary | ICD-10-CM | POA: Diagnosis not present

## 2022-12-04 DIAGNOSIS — N3021 Other chronic cystitis with hematuria: Secondary | ICD-10-CM | POA: Diagnosis not present

## 2022-12-06 ENCOUNTER — Ambulatory Visit (HOSPITAL_BASED_OUTPATIENT_CLINIC_OR_DEPARTMENT_OTHER): Payer: 59 | Admitting: Psychiatry

## 2022-12-06 ENCOUNTER — Encounter (HOSPITAL_COMMUNITY): Payer: Self-pay | Admitting: Psychiatry

## 2022-12-06 DIAGNOSIS — F419 Anxiety disorder, unspecified: Secondary | ICD-10-CM | POA: Diagnosis not present

## 2022-12-06 DIAGNOSIS — F319 Bipolar disorder, unspecified: Secondary | ICD-10-CM

## 2022-12-06 MED ORDER — LAMOTRIGINE 200 MG PO TABS: ORAL_TABLET | ORAL | 0 refills | Status: DC

## 2022-12-06 MED ORDER — AMITRIPTYLINE HCL 100 MG PO TABS: 100.0000 mg | ORAL_TABLET | Freq: Every day | ORAL | 0 refills | Status: DC

## 2022-12-06 MED ORDER — BUSPIRONE HCL 10 MG PO TABS: ORAL_TABLET | ORAL | 2 refills | Status: DC
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
## 2022-12-19 DIAGNOSIS — M810 Age-related osteoporosis without current pathological fracture: Secondary | ICD-10-CM | POA: Diagnosis not present

## 2022-12-19 DIAGNOSIS — R404 Transient alteration of awareness: Secondary | ICD-10-CM | POA: Diagnosis not present

## 2022-12-19 DIAGNOSIS — E782 Mixed hyperlipidemia: Secondary | ICD-10-CM | POA: Diagnosis not present

## 2022-12-19 DIAGNOSIS — R609 Edema, unspecified: Secondary | ICD-10-CM | POA: Diagnosis not present

## 2022-12-19 DIAGNOSIS — K760 Fatty (change of) liver, not elsewhere classified: Secondary | ICD-10-CM | POA: Diagnosis not present

## 2022-12-19 DIAGNOSIS — N390 Urinary tract infection, site not specified: Secondary | ICD-10-CM | POA: Diagnosis not present

## 2022-12-19 DIAGNOSIS — G629 Polyneuropathy, unspecified: Secondary | ICD-10-CM | POA: Diagnosis not present

## 2022-12-19 DIAGNOSIS — N319 Neuromuscular dysfunction of bladder, unspecified: Secondary | ICD-10-CM | POA: Diagnosis not present

## 2022-12-19 DIAGNOSIS — E118 Type 2 diabetes mellitus with unspecified complications: Secondary | ICD-10-CM | POA: Diagnosis not present

## 2022-12-19 DIAGNOSIS — K219 Gastro-esophageal reflux disease without esophagitis: Secondary | ICD-10-CM | POA: Diagnosis not present

## 2023-01-03 ENCOUNTER — Ambulatory Visit (INDEPENDENT_AMBULATORY_CARE_PROVIDER_SITE_OTHER): Payer: 59 | Admitting: Orthopedic Surgery

## 2023-01-03 ENCOUNTER — Ambulatory Visit (INDEPENDENT_AMBULATORY_CARE_PROVIDER_SITE_OTHER): Payer: 59

## 2023-01-03 ENCOUNTER — Telehealth: Payer: Self-pay | Admitting: Physical Medicine and Rehabilitation

## 2023-01-03 DIAGNOSIS — M542 Cervicalgia: Secondary | ICD-10-CM

## 2023-01-03 DIAGNOSIS — M545 Low back pain, unspecified: Secondary | ICD-10-CM

## 2023-01-03 MED ORDER — GABAPENTIN 300 MG PO CAPS: 300.0000 mg | ORAL_CAPSULE | Freq: Three times a day (TID) | ORAL | 3 refills | Status: DC

## 2023-01-03 MED ORDER — METHOCARBAMOL 500 MG PO TABS: 500.0000 mg | ORAL_TABLET | Freq: Three times a day (TID) | ORAL | 0 refills | Status: DC | PRN

## 2023-01-03 NOTE — Telephone Encounter (Signed)
Patient advised she is in severe pain attempted to transfer call to Triage but Triage busy and she got routed back to me. I sent call to [place] after speaking to [place].
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
## 2023-01-09 ENCOUNTER — Encounter: Payer: Self-pay | Admitting: Orthopedic Surgery
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
## 2023-01-11 ENCOUNTER — Telehealth: Payer: Self-pay

## 2023-01-11 NOTE — Patient Outreach (Signed)
  Care Coordination   Initial Visit Note   01/11/2023 Name: ANA SCHMUHL MRN: [id number] DOB: 02-03-1956  GENEEN LEDESMA is a 67 y.o. year old female who sees Cyndi Bender, [place] for primary care. I spoke with  [name] by phone today.  What matters to the patients health and wellness today?  Placed call to patient to review and offer [facility] care coordination program. Patient reports that she is managing her DM well. Reports that she had a recent fall.     Goals Addressed               This Visit's Progress     COMPLETED: Fall prevention (pt-stated)        Interventions Today    Flowsheet Row Most Recent Value  Chronic Disease   Chronic disease during today's visit Diabetes, Other  [falls]  General Interventions   General Interventions Discussed/Reviewed General Interventions Discussed  Safety Interventions   Safety Discussed/Reviewed Home Safety  [reviewed recent fall 2 weeks ago. Reviewed that patient did have follow up and has no fractures.  Reviewed safety and fall prevention in the home,  Encouraged patient to call MD for continued concerns.]              SDOH assessments and interventions completed:  No     Care Coordination Interventions:  Yes, provided   Follow up plan: No further intervention required.   Encounter Outcome:  Pt. Visit Completed   Tomasa Rand, RN, BSN, CEN [place] Coordinator [phone]

## 2023-01-28 DIAGNOSIS — R569 Unspecified convulsions: Secondary | ICD-10-CM | POA: Diagnosis not present

## 2023-01-28 DIAGNOSIS — M4802 Spinal stenosis, cervical region: Secondary | ICD-10-CM | POA: Diagnosis not present

## 2023-01-28 DIAGNOSIS — Z981 Arthrodesis status: Secondary | ICD-10-CM | POA: Diagnosis not present

## 2023-01-28 DIAGNOSIS — R269 Unspecified abnormalities of gait and mobility: Secondary | ICD-10-CM | POA: Diagnosis not present

## 2023-02-01 DIAGNOSIS — R3914 Feeling of incomplete bladder emptying: Secondary | ICD-10-CM | POA: Diagnosis not present

## 2023-02-01 DIAGNOSIS — N3021 Other chronic cystitis with hematuria: Secondary | ICD-10-CM | POA: Diagnosis not present

## 2023-02-07 DIAGNOSIS — G35 Multiple sclerosis: Secondary | ICD-10-CM | POA: Diagnosis not present

## 2023-02-07 DIAGNOSIS — G809 Cerebral palsy, unspecified: Secondary | ICD-10-CM | POA: Diagnosis not present

## 2023-02-13 DIAGNOSIS — M269 Dentofacial anomaly, unspecified: Secondary | ICD-10-CM | POA: Diagnosis not present

## 2023-02-13 DIAGNOSIS — R569 Unspecified convulsions: Secondary | ICD-10-CM | POA: Diagnosis not present

## 2023-02-13 DIAGNOSIS — M4802 Spinal stenosis, cervical region: Secondary | ICD-10-CM | POA: Diagnosis not present

## 2023-02-13 DIAGNOSIS — Z981 Arthrodesis status: Secondary | ICD-10-CM | POA: Diagnosis not present

## 2023-02-19 DIAGNOSIS — M810 Age-related osteoporosis without current pathological fracture: Secondary | ICD-10-CM | POA: Diagnosis not present

## 2023-02-19 DIAGNOSIS — G809 Cerebral palsy, unspecified: Secondary | ICD-10-CM | POA: Diagnosis not present

## 2023-02-19 DIAGNOSIS — K219 Gastro-esophageal reflux disease without esophagitis: Secondary | ICD-10-CM | POA: Diagnosis not present

## 2023-02-19 DIAGNOSIS — R404 Transient alteration of awareness: Secondary | ICD-10-CM | POA: Diagnosis not present

## 2023-02-19 DIAGNOSIS — J309 Allergic rhinitis, unspecified: Secondary | ICD-10-CM | POA: Diagnosis not present

## 2023-02-19 DIAGNOSIS — E118 Type 2 diabetes mellitus with unspecified complications: Secondary | ICD-10-CM | POA: Diagnosis not present

## 2023-02-19 DIAGNOSIS — N319 Neuromuscular dysfunction of bladder, unspecified: Secondary | ICD-10-CM | POA: Diagnosis not present

## 2023-02-19 DIAGNOSIS — R609 Edema, unspecified: Secondary | ICD-10-CM | POA: Diagnosis not present

## 2023-02-19 DIAGNOSIS — G629 Polyneuropathy, unspecified: Secondary | ICD-10-CM | POA: Diagnosis not present

## 2023-02-19 DIAGNOSIS — R262 Difficulty in walking, not elsewhere classified: Secondary | ICD-10-CM | POA: Diagnosis not present

## 2023-02-21 ENCOUNTER — Other Ambulatory Visit (HOSPITAL_COMMUNITY): Payer: Self-pay | Admitting: Psychiatry

## 2023-02-21 ENCOUNTER — Telehealth (HOSPITAL_COMMUNITY): Payer: Self-pay | Admitting: *Deleted

## 2023-02-21 DIAGNOSIS — F419 Anxiety disorder, unspecified: Secondary | ICD-10-CM

## 2023-02-21 DIAGNOSIS — F319 Bipolar disorder, unspecified: Secondary | ICD-10-CM

## 2023-02-21 MED ORDER — LAMOTRIGINE 200 MG PO TABS: ORAL_TABLET | ORAL | 0 refills | Status: DC

## 2023-02-21 MED ORDER — AMITRIPTYLINE HCL 100 MG PO TABS: 100.0000 mg | ORAL_TABLET | Freq: Every day | ORAL | 0 refills | Status: DC

## 2023-02-21 MED ORDER — BUSPIRONE HCL 10 MG PO TABS: ORAL_TABLET | ORAL | 0 refills | Status: DC

## 2023-02-21 NOTE — Telephone Encounter (Signed)
Patient was given 90-day prescription on February 2.  She should have enough medication until May 2.  Not sure why she is saying that she is out for 2 months.  I have given another 30-day supply until her appointment on May 23.  Please call the patient.

## 2023-02-21 NOTE — Addendum Note (Signed)
Addended by: Kathryne Sharper T on: 02/21/2023 12:18 PM   Modules accepted: Orders

## 2023-02-21 NOTE — Telephone Encounter (Signed)
Patient called front to schedule appt  Last appt: 12/06/22 Next appt: 03/21/23  Patient states he's been out medication for the last 2 months & requested refills until 03/21/23 appt

## 2023-02-21 NOTE — Telephone Encounter (Signed)
Done. LVM

## 2023-02-22 ENCOUNTER — Other Ambulatory Visit (HOSPITAL_COMMUNITY): Payer: Self-pay | Admitting: Psychiatry

## 2023-02-22 DIAGNOSIS — F319 Bipolar disorder, unspecified: Secondary | ICD-10-CM

## 2023-02-22 DIAGNOSIS — F419 Anxiety disorder, unspecified: Secondary | ICD-10-CM

## 2023-03-01 DIAGNOSIS — G9389 Other specified disorders of brain: Secondary | ICD-10-CM | POA: Diagnosis not present

## 2023-03-01 DIAGNOSIS — R569 Unspecified convulsions: Secondary | ICD-10-CM | POA: Diagnosis not present

## 2023-03-01 DIAGNOSIS — R9401 Abnormal electroencephalogram [EEG]: Secondary | ICD-10-CM | POA: Diagnosis not present

## 2023-03-01 DIAGNOSIS — Z79899 Other long term (current) drug therapy: Secondary | ICD-10-CM | POA: Diagnosis not present

## 2023-03-01 DIAGNOSIS — G809 Cerebral palsy, unspecified: Secondary | ICD-10-CM | POA: Diagnosis not present

## 2023-03-04 ENCOUNTER — Encounter (HOSPITAL_COMMUNITY): Payer: Self-pay | Admitting: Emergency Medicine

## 2023-03-04 ENCOUNTER — Ambulatory Visit (HOSPITAL_COMMUNITY)
Admission: EM | Admit: 2023-03-04 | Discharge: 2023-03-04 | Disposition: A | Discharge status: Home / Self Care | Payer: 59 | Attending: Physician Assistant | Admitting: Physician Assistant

## 2023-03-04 DIAGNOSIS — N390 Urinary tract infection, site not specified: Secondary | ICD-10-CM

## 2023-03-04 DIAGNOSIS — R319 Hematuria, unspecified: Secondary | ICD-10-CM

## 2023-03-04 LAB — POCT URINALYSIS DIP (MANUAL ENTRY)
Glucose, UA: NEGATIVE mg/dL
Nitrite, UA: POSITIVE — AB
Protein Ur, POC: >=300 mg/dL — AB
Spec Grav, UA: 1.015 (ref 1.010–1.025)
Urobilinogen, UA: 1 E.U./dL
pH, UA: >=9 — AB (ref 5.0–8.0)

## 2023-03-04 MED ORDER — CEPHALEXIN 500 MG PO CAPS: 500.0000 mg | ORAL_CAPSULE | Freq: Four times a day (QID) | ORAL | 0 refills | Status: AC

## 2023-03-04 NOTE — ED Provider Notes (Signed)
[facility]    CSN: [id number] Arrival date & time: 03/04/23  1750      History   Chief Complaint Chief Complaint  Patient presents with   Dysuria    HPI [name] is a 67 y.o. female.   Complains of symptoms of urinary tract infection.  Patient reports that she has had urosepsis in the past and wants to get on a antibiotic quickly.  Patient denies any fever or chills she denies any back pain she has not had any abdominal pain.   Dysuria   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HCC)    Anxiety    Arthritis    Bacterial infection    Benign paroxysmal positional vertigo 02/24/2013   Bipolar disorder (HCC)    Bladder infection    Cancer (HCC)    skin cancer   Cerebral palsy (HCC)    Colitis    COPD (chronic obstructive pulmonary disease) (HCC)    mild emphysema   Depression    bi polar   Diabetes mellitus, type II (HCC)    Dysplasia of cervix    GERD (gastroesophageal reflux disease)    Headache(784.0)    High cholesterol    History of chicken pox    History of measles    History of mumps    IBS (irritable bowel syndrome)    Kidney infection    Melanoma in situ (HCC)    Neck mass    Neuromuscular disorder (HCC)    Ovarian cyst    Poor circulation    Stenosis, cervical spine 05/16   Trichomonas    UTI (urinary tract infection)    current   Yeast infection     Patient Active Problem List   Diagnosis Date Noted   Bilateral shoulder pain 08/09/2022   Neck pain 11/21/2018   Low back pain 08/19/2018   Weakness of both lower extremities 08/19/2018   Pain in finger of left hand 05/28/2018   Pain in right hand 05/28/2018   Pain in left elbow 04/02/2018   Chronic right shoulder pain 07/16/2017   Rotator cuff syndrome, right 05/13/2017   Cervical radiculopathy 01/14/2017   Colon cancer screening    Encounter for screening for gastric cancer    Osteoarthritis of spine with radiculopathy, cervical region 05/25/2016    Postmenopausal bleeding 08/04/2015   Acute respiratory failure (HCC) 07/19/2013   Nocturnal hypoxemia 07/07/2013   UTI (lower urinary tract infection) 07/07/2013   Hypokalemia 07/07/2013   Abdominal pain, acute, right lower quadrant 07/07/2013   Benign paroxysmal positional vertigo 02/24/2013   Edema of both legs 02/24/2013   Dysplasia of cervix    Melanoma in situ [facility])    Bipolar 1 disorder (HCC) 02/08/2012   IBS 12/04/2007   RASH AND OTHER NONSPECIFIC SKIN ERUPTION 12/04/2007   EMPHYSEMA by CT only 09/05/2007   NECK MASS 09/05/2007   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HCC) 08/13/2007   Cerebral palsy (HCC) 08/13/2007   OTITIS EXTERNA, ACUTE 08/13/2007   IRRITABLE BOWEL SYNDROME, HX OF 08/13/2007   HYPERLIPIDEMIA 05/10/2007   DEPRESSION 05/10/2007   ALLERGIC RHINITIS 05/10/2007    Past Surgical History:  Procedure Laterality Date   ANTERIOR CERVICAL DECOMP/DISCECTOMY FUSION N/A 05/25/2016   Procedure: Cervical five-six Anterior cervical decompression/diskectomy/fusion;  Surgeon: Coletta Memos, MD;  Location: [facility];  Service: Neurosurgery;  Laterality: N/A;   APPENDECTOMY     CARPAL TUNNEL RELEASE     CARPAL TUNNEL RELEASE Right 08/02/2017   Procedure: CARPAL TUNNEL  RELEASE RIGHT;  Surgeon: Coletta Memos, MD;  Location: [facility] OR;  Service: Neurosurgery;  Laterality: Right;  CARPAL TUNNEL RELEASE RIGHT   CARPAL TUNNEL RELEASE Left 11/29/2017   Procedure: CARPAL TUNNEL RELEASE LEFT;  Surgeon: Coletta Memos, MD;  Location: [facility] OR;  Service: Neurosurgery;  Laterality: Left;  CARPAL TUNNEL RELEASE LEFT   COLONOSCOPY WITH PROPOFOL N/A 11/29/2016   Procedure: COLONOSCOPY WITH PROPOFOL;  Surgeon: Rachael Fee, MD;  Location: [facility];  Service: Endoscopy;  Laterality: N/A;   ESOPHAGOGASTRODUODENOSCOPY (EGD) WITH PROPOFOL N/A 11/29/2016   Procedure: ESOPHAGOGASTRODUODENOSCOPY (EGD) WITH PROPOFOL;  Surgeon: Rachael Fee, MD;  Location: [facility];  Service: Endoscopy;  Laterality: N/A;   IR  INJECT/THERA/INC NEEDLE/CATH/PLC EPI/CERV/THOR [facility]  03/04/2020   JOINT REPLACEMENT     knee rotation     LEG TENDON SURGERY     OVARIAN CYST REMOVAL     REPLACEMENT TOTAL KNEE BILATERAL  2006   rotator cuff surgery      OB History     Gravida  3   Para  2   Term      Preterm      AB      Living  2      SAB      IAB      Ectopic      Multiple      Live Births               Home Medications    Prior to Admission medications   Medication Sig Start Date End Date Taking? Authorizing Provider  cephALEXin (KEFLEX) 500 MG capsule Take 1 capsule (500 mg total) by mouth 4 (four) times daily for 10 days. 03/04/23 03/14/23 Yes Elson Areas, PA-C  Acetylcarnitine HCl (ACETYL-L-CARNITINE HCL) POWD by Does not apply route.    [provider]  albuterol (PROVENTIL HFA;VENTOLIN HFA) 108 (90 Base) MCG/ACT inhaler Inhale 1-2 puffs into the lungs every 6 (six) hours as needed for wheezing or shortness of breath.    [provider]  alendronate (FOSAMAX) 70 MG tablet Take 70 mg by mouth every Monday. Take with a full glass of water on an empty stomach.    [provider]  amitriptyline (ELAVIL) 100 MG tablet Take 1 tablet (100 mg total) by mouth at bedtime. 02/21/23   Cleotis Nipper, MD  AQUALANCE LANCETS 30G MISC  04/25/18   [provider]  Ascorbic Acid (VITAMIN C) 500 MG CAPS Take 500 mg by mouth at bedtime.     [provider]  atorvastatin (LIPITOR) 40 MG tablet 1 tablet    [provider]  Blood Glucose Calibration (OT ULTRA/FASTTK CNTRL SOLN) SOLN  05/05/18   [provider]  Blood Glucose Monitoring Suppl (ONE TOUCH ULTRA 2) w/Device KIT  05/05/18   [provider]  busPIRone (BUSPAR) 10 MG tablet TAKE 1 TABLET(10 MG) BY MOUTH TWICE DAILY 02/21/23   Arfeen, Phillips Grout, MD  Calcium Carb-Cholecalciferol (CALCIUM 600+D3 PO) Take 1 tablet by mouth daily.    [provider]  cyanocobalamin 100 MCG tablet  Take by mouth.    [provider]  esomeprazole (NEXIUM) 40 MG capsule TK ONE C PO D FOR ANTIACID 06/05/19   [provider]  fluticasone (FLONASE) 50 MCG/ACT nasal spray 2 sprays by Each Nare route daily.    [provider]  Fluticasone Propionate, Inhal, 50 MCG/ACT AEPB Inhale into the lungs.    [provider]  folic acid (FOLVITE)  400 MCG tablet 1 tablet    [provider]  furosemide (LASIX) 40 MG tablet Take 1 tablet by mouth daily. 08/10/19   [provider]  gabapentin (NEURONTIN) 300 MG capsule Take 300 mg by mouth 2 (two) times daily. 07/28/21   [provider]  gabapentin (NEURONTIN) 300 MG capsule Take 1 capsule (300 mg total) by mouth 3 (three) times daily. 3 times a day when necessary neuropathy pain 01/03/23   Nadara Mustard, MD  glipiZIDE (GLUCOTROL XL) 10 MG 24 hr tablet TK 1 T PO QAM FOR DIABETES 04/08/19   [provider]  hydrocortisone 1 % ointment Apply 1 application topically 2 (two) times daily. 08/14/19   Unk Lightning, PA  lamoTRIgine (LAMICTAL) 200 MG tablet TAKE 1 TABLET(200 MG) BY MOUTH DAILY. 02/21/23   Cleotis Nipper, MD  Lancet Devices (ADJUSTABLE LANCING DEVICE) MISC  05/05/18   [provider]  Lysine 500 MG CAPS Take by mouth.    [provider]  Melatonin 10 MG TABS Take 1 tablet by mouth at bedtime.    [provider]  metFORMIN (GLUCOPHAGE-XR) 500 MG 24 hr tablet Take 500 mg by mouth daily with breakfast. IN THE MORNING.    [provider]  methenamine (HIPREX) 1 g tablet Take 1 g by mouth at bedtime.     [provider]  methocarbamol (ROBAXIN) 500 MG tablet Take 1 tablet (500 mg total) by mouth every 8 (eight) hours as needed for muscle spasms. 01/03/23   Nadara Mustard, MD  mirabegron ER (MYRBETRIQ) 25 MG TB24 tablet Take by mouth.    [provider]  ONE TOUCH ULTRA TEST test strip  05/05/18   [provider]  orlistat  (XENICAL) 120 MG capsule Take by mouth.    [provider]  pilocarpine (SALAGEN) 5 MG tablet Take by mouth.    [provider]  Polyethyl Glycol-Propyl Glycol 0.4-0.3 % SOLN Place 1-2 drops into both eyes 3 (three) times daily as needed (for dry eyes.).    [provider]  potassium chloride (KLOR-CON) 10 MEQ tablet Take 10 mEq by mouth daily. 05/16/21   [provider]  Propylene Glycol-Glycerin 1-0.3 % SOLN Apply 1 drop to eye in the morning and at bedtime.    [provider]  ranitidine (ZANTAC) 300 MG tablet Take 300 mg by mouth daily.    [provider]  risedronate (ACTONEL) 150 MG tablet Take 150 mg by mouth every morning. 06/09/20   [provider]  spironolactone (ALDACTONE) 100 MG tablet Take 100 mg by mouth daily.  01/28/15   [provider]  tamsulosin (FLOMAX) 0.4 MG CAPS capsule Take 0.4 mg by mouth at bedtime. 03/06/16   [provider]  triamterene-hydrochlorothiazide (MAXZIDE) 75-50 MG tablet Take by mouth.    [provider]    Family History Family History  Problem Relation Age of Onset   Suicidality Father    Depression Father    Alcohol abuse Father    Emphysema Father        smoked   Drug abuse Brother    Alcohol abuse Brother    Esophageal cancer Brother    Depression Maternal Aunt    Alcohol abuse Brother    Anxiety disorder Daughter    Depression Daughter    Suicidality Daughter    Alcohol abuse Daughter    Drug abuse Son    Alcohol abuse Son    Emphysema Sister  smoked    Social History Social History   Tobacco Use   Smoking status: Former    Packs/day: 1.00    Years: 31.00    Additional pack years: 0.00    Total pack years: 31.00    Types: Cigarettes    Quit date: 11/18/1999    Years since quitting: 23.3   Smokeless tobacco: Never  Vaping Use   Vaping Use: Never used  Substance Use Topics   Alcohol use: Yes    Alcohol/week: 0.0 standard drinks of  alcohol    Comment: occ.   Drug use: No     Allergies   Miconazole nitrate and Sulfonamide derivatives   Review of Systems Review of Systems  Genitourinary:  Positive for dysuria.  All other systems reviewed and are negative.    Physical Exam Triage Vital Signs ED Triage Vitals  Enc Vitals Group     BP 03/04/23 1834 125/67     Pulse Rate 03/04/23 1834 95     Resp 03/04/23 1834 18     Temp 03/04/23 1834 98.2 F (36.8 C)     Temp Source 03/04/23 1834 Oral     SpO2 03/04/23 1834 95 %     Weight --      Height --      Head Circumference --      Peak Flow --      Pain Score 03/04/23 1833 0     Pain Loc --      Pain Edu? --      Excl. in GC? --    No data found.  Updated Vital Signs BP 125/67 (BP Location: Right Arm)   Pulse 95   Temp 98.2 F (36.8 C) (Oral)   Resp 18   SpO2 95%   Visual Acuity Right Eye Distance:   Left Eye Distance:   Bilateral Distance:    Right Eye Near:   Left Eye Near:    Bilateral Near:     Physical Exam Vitals and nursing note reviewed.  Constitutional:      Appearance: She is well-developed.  HENT:     Head: Normocephalic.  Cardiovascular:     Rate and Rhythm: Normal rate.  Pulmonary:     Effort: Pulmonary effort is normal.  Abdominal:     General: There is no distension.  Musculoskeletal:        General: Normal range of motion.     Cervical back: Normal range of motion.  Skin:    General: Skin is warm.  Neurological:     General: No focal deficit present.     Mental Status: She is alert and oriented to person, place, and time.      UC Treatments / Results  Labs (all labs ordered are listed, but only abnormal results are displayed) Labs Reviewed  POCT URINALYSIS DIP (MANUAL ENTRY) - Abnormal; Notable for the following components:      Result Value   Color, UA brown (*)    Bilirubin, UA small (*)    Ketones, POC UA trace (5) (*)    Blood, UA large (*)    pH, UA >=9.0 (*)    Protein Ur, POC >=300 (*)     Nitrite, UA Positive (*)    Leukocytes, UA Small (1+) (*)    All other components within normal limits    EKG   Radiology No results found.  Procedures Procedures (including critical care time)  Medications Ordered in UC Medications - No data to display  Initial Impression / Assessment and Plan / UC Course  I have reviewed the triage vital signs and the nursing notes.  Pertinent labs & imaging results that were available during my care of the patient were reviewed by me and considered in my medical decision making (see chart for details).     UA shows nitrates leukocytes and blood.  Patient given a prescription for Keflex she is advised to see her physician for recheck. Final Clinical Impressions(s) / UC Diagnoses   Final diagnoses:  Urinary tract infection with hematuria, site unspecified     Discharge Instructions      Follow up with your Physician for recheck in 3-4 days    ED Prescriptions     Medication Sig Dispense Auth. Provider   cephALEXin (KEFLEX) 500 MG capsule Take 1 capsule (500 mg total) by mouth 4 (four) times daily for 10 days. 40 capsule Elson Areas, [place]      PDMP not reviewed this encounter. An After Visit Summary was printed and given to the patient.    Elson Areas, [place] 03/04/23 1921

## 2023-03-04 NOTE — ED Triage Notes (Signed)
Pt presents with dysuria,  and urinary hesitancy and dark colored urine x 4 days. Reports having a chronic foley for the past 2 years.

## 2023-03-04 NOTE — Discharge Instructions (Addendum)
Follow up with your Physician for recheck in 3-4 days   

## 2023-03-05 LAB — URINE CULTURE: Special Requests: NORMAL

## 2023-03-06 LAB — URINE CULTURE: Culture: >=100000 — AB

## 2023-03-07 ENCOUNTER — Ambulatory Visit (HOSPITAL_COMMUNITY): Payer: 59 | Admitting: Psychiatry

## 2023-03-12 DIAGNOSIS — H25013 Cortical age-related cataract, bilateral: Secondary | ICD-10-CM | POA: Diagnosis not present

## 2023-03-12 DIAGNOSIS — H2513 Age-related nuclear cataract, bilateral: Secondary | ICD-10-CM | POA: Diagnosis not present

## 2023-03-12 DIAGNOSIS — E119 Type 2 diabetes mellitus without complications: Secondary | ICD-10-CM | POA: Diagnosis not present

## 2023-03-13 DIAGNOSIS — R310 Gross hematuria: Secondary | ICD-10-CM | POA: Diagnosis not present

## 2023-03-13 DIAGNOSIS — R3914 Feeling of incomplete bladder emptying: Secondary | ICD-10-CM | POA: Diagnosis not present

## 2023-03-18 DIAGNOSIS — N3021 Other chronic cystitis with hematuria: Secondary | ICD-10-CM | POA: Diagnosis not present

## 2023-03-18 DIAGNOSIS — R3914 Feeling of incomplete bladder emptying: Secondary | ICD-10-CM | POA: Diagnosis not present

## 2023-03-20 DIAGNOSIS — N3021 Other chronic cystitis with hematuria: Secondary | ICD-10-CM | POA: Diagnosis not present

## 2023-03-20 DIAGNOSIS — R3914 Feeling of incomplete bladder emptying: Secondary | ICD-10-CM | POA: Diagnosis not present

## 2023-03-21 ENCOUNTER — Ambulatory Visit (HOSPITAL_COMMUNITY): Payer: 59 | Admitting: Psychiatry

## 2023-03-28 DIAGNOSIS — G809 Cerebral palsy, unspecified: Secondary | ICD-10-CM | POA: Diagnosis not present

## 2023-03-28 DIAGNOSIS — G35 Multiple sclerosis: Secondary | ICD-10-CM | POA: Diagnosis not present

## 2023-04-02 ENCOUNTER — Telehealth (HOSPITAL_COMMUNITY): Payer: Self-pay

## 2023-04-02 NOTE — Telephone Encounter (Signed)
Fax received from patients pharmacy for refills on her Lamictal, Buspar and Elavil. #0 day supply sent in on 4/25 to cover her until her appointment in May but patient rescheduled to June. Send another 30 days? Follow up now is June 27 in person. Please review and advise, thank you

## 2023-04-02 NOTE — Telephone Encounter (Signed)
Yes please provide a 30-day bridge supply until she has next appointment.

## 2023-04-03 ENCOUNTER — Other Ambulatory Visit (HOSPITAL_COMMUNITY): Payer: Self-pay

## 2023-04-03 DIAGNOSIS — R0683 Snoring: Secondary | ICD-10-CM | POA: Diagnosis not present

## 2023-04-03 DIAGNOSIS — F319 Bipolar disorder, unspecified: Secondary | ICD-10-CM

## 2023-04-03 DIAGNOSIS — R569 Unspecified convulsions: Secondary | ICD-10-CM | POA: Diagnosis not present

## 2023-04-03 DIAGNOSIS — M4802 Spinal stenosis, cervical region: Secondary | ICD-10-CM | POA: Diagnosis not present

## 2023-04-03 DIAGNOSIS — Z981 Arthrodesis status: Secondary | ICD-10-CM | POA: Diagnosis not present

## 2023-04-03 DIAGNOSIS — R269 Unspecified abnormalities of gait and mobility: Secondary | ICD-10-CM | POA: Diagnosis not present

## 2023-04-03 DIAGNOSIS — F419 Anxiety disorder, unspecified: Secondary | ICD-10-CM

## 2023-04-03 DIAGNOSIS — R413 Other amnesia: Secondary | ICD-10-CM | POA: Diagnosis not present

## 2023-04-03 MED ORDER — AMITRIPTYLINE HCL 100 MG PO TABS
100.0000 mg | ORAL_TABLET | Freq: Every day | ORAL | 0 refills | Status: DC
Start: 2023-04-03 — End: 2023-04-25

## 2023-04-03 MED ORDER — BUSPIRONE HCL 10 MG PO TABS
ORAL_TABLET | ORAL | 0 refills | Status: DC
Start: 2023-04-03 — End: 2023-04-25

## 2023-04-03 MED ORDER — LAMOTRIGINE 200 MG PO TABS
ORAL_TABLET | ORAL | 0 refills | Status: DC
Start: 2023-04-03 — End: 2023-04-25

## 2023-04-12 DIAGNOSIS — N281 Cyst of kidney, acquired: Secondary | ICD-10-CM | POA: Diagnosis not present

## 2023-04-12 DIAGNOSIS — R310 Gross hematuria: Secondary | ICD-10-CM | POA: Diagnosis not present

## 2023-04-12 DIAGNOSIS — R319 Hematuria, unspecified: Secondary | ICD-10-CM | POA: Diagnosis not present

## 2023-04-17 ENCOUNTER — Other Ambulatory Visit (HOSPITAL_COMMUNITY): Payer: Self-pay | Admitting: Psychiatry

## 2023-04-17 DIAGNOSIS — F319 Bipolar disorder, unspecified: Secondary | ICD-10-CM

## 2023-04-17 DIAGNOSIS — F419 Anxiety disorder, unspecified: Secondary | ICD-10-CM

## 2023-04-18 ENCOUNTER — Encounter: Payer: 59 | Admitting: Obstetrics and Gynecology

## 2023-04-18 DIAGNOSIS — R310 Gross hematuria: Secondary | ICD-10-CM | POA: Diagnosis not present

## 2023-04-18 DIAGNOSIS — R338 Other retention of urine: Secondary | ICD-10-CM | POA: Diagnosis not present

## 2023-04-18 DIAGNOSIS — N319 Neuromuscular dysfunction of bladder, unspecified: Secondary | ICD-10-CM | POA: Diagnosis not present

## 2023-04-24 DIAGNOSIS — N319 Neuromuscular dysfunction of bladder, unspecified: Secondary | ICD-10-CM | POA: Diagnosis not present

## 2023-04-24 DIAGNOSIS — G809 Cerebral palsy, unspecified: Secondary | ICD-10-CM | POA: Diagnosis not present

## 2023-04-24 DIAGNOSIS — R609 Edema, unspecified: Secondary | ICD-10-CM | POA: Diagnosis not present

## 2023-04-24 DIAGNOSIS — M810 Age-related osteoporosis without current pathological fracture: Secondary | ICD-10-CM | POA: Diagnosis not present

## 2023-04-24 DIAGNOSIS — R262 Difficulty in walking, not elsewhere classified: Secondary | ICD-10-CM | POA: Diagnosis not present

## 2023-04-24 DIAGNOSIS — E782 Mixed hyperlipidemia: Secondary | ICD-10-CM | POA: Diagnosis not present

## 2023-04-24 DIAGNOSIS — R3914 Feeling of incomplete bladder emptying: Secondary | ICD-10-CM | POA: Diagnosis not present

## 2023-04-24 DIAGNOSIS — G629 Polyneuropathy, unspecified: Secondary | ICD-10-CM | POA: Diagnosis not present

## 2023-04-24 DIAGNOSIS — E118 Type 2 diabetes mellitus with unspecified complications: Secondary | ICD-10-CM | POA: Diagnosis not present

## 2023-04-24 DIAGNOSIS — K760 Fatty (change of) liver, not elsewhere classified: Secondary | ICD-10-CM | POA: Diagnosis not present

## 2023-04-24 DIAGNOSIS — J309 Allergic rhinitis, unspecified: Secondary | ICD-10-CM | POA: Diagnosis not present

## 2023-04-24 DIAGNOSIS — N3021 Other chronic cystitis with hematuria: Secondary | ICD-10-CM | POA: Diagnosis not present

## 2023-04-24 DIAGNOSIS — K219 Gastro-esophageal reflux disease without esophagitis: Secondary | ICD-10-CM | POA: Diagnosis not present

## 2023-04-24 DIAGNOSIS — R404 Transient alteration of awareness: Secondary | ICD-10-CM | POA: Diagnosis not present

## 2023-04-25 ENCOUNTER — Ambulatory Visit (HOSPITAL_BASED_OUTPATIENT_CLINIC_OR_DEPARTMENT_OTHER): Payer: 59 | Admitting: Psychiatry

## 2023-04-25 ENCOUNTER — Encounter (HOSPITAL_COMMUNITY): Payer: Self-pay | Admitting: Psychiatry

## 2023-04-25 VITALS — BP 109/71 | HR 86 | Ht 66.0 in | Wt 250.0 lb

## 2023-04-25 DIAGNOSIS — N3021 Other chronic cystitis with hematuria: Secondary | ICD-10-CM | POA: Diagnosis not present

## 2023-04-25 DIAGNOSIS — R3914 Feeling of incomplete bladder emptying: Secondary | ICD-10-CM | POA: Diagnosis not present

## 2023-04-25 DIAGNOSIS — F319 Bipolar disorder, unspecified: Secondary | ICD-10-CM | POA: Diagnosis not present

## 2023-04-25 DIAGNOSIS — R413 Other amnesia: Secondary | ICD-10-CM | POA: Diagnosis not present

## 2023-04-25 DIAGNOSIS — F419 Anxiety disorder, unspecified: Secondary | ICD-10-CM | POA: Diagnosis not present

## 2023-04-25 MED ORDER — BUSPIRONE HCL 10 MG PO TABS
ORAL_TABLET | ORAL | 1 refills | Status: DC
Start: 2023-04-25 — End: 2023-10-03

## 2023-04-25 MED ORDER — AMITRIPTYLINE HCL 100 MG PO TABS
100.0000 mg | ORAL_TABLET | Freq: Every day | ORAL | 1 refills | Status: DC
Start: 2023-04-25 — End: 2023-06-27

## 2023-04-25 MED ORDER — LAMOTRIGINE 200 MG PO TABS
ORAL_TABLET | ORAL | 1 refills | Status: DC
Start: 2023-04-25 — End: 2023-06-27
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
## 2023-05-06 ENCOUNTER — Other Ambulatory Visit (HOSPITAL_COMMUNITY): Payer: Self-pay

## 2023-05-16 ENCOUNTER — Other Ambulatory Visit: Payer: Self-pay

## 2023-05-16 ENCOUNTER — Encounter: Payer: 59 | Admitting: Obstetrics and Gynecology

## 2023-05-16 ENCOUNTER — Encounter (HOSPITAL_COMMUNITY): Payer: Self-pay

## 2023-05-16 ENCOUNTER — Emergency Department (HOSPITAL_COMMUNITY)
Admission: EM | Admit: 2023-05-16 | Discharge: 2023-05-17 | Disposition: A | Discharge status: Home / Self Care | Payer: 59 | Attending: Emergency Medicine | Admitting: Emergency Medicine

## 2023-05-16 DIAGNOSIS — Z7982 Long term (current) use of aspirin: Secondary | ICD-10-CM | POA: Insufficient documentation

## 2023-05-16 DIAGNOSIS — R339 Retention of urine, unspecified: Secondary | ICD-10-CM | POA: Diagnosis not present

## 2023-05-16 DIAGNOSIS — N3000 Acute cystitis without hematuria: Secondary | ICD-10-CM | POA: Diagnosis not present

## 2023-05-16 DIAGNOSIS — G809 Cerebral palsy, unspecified: Secondary | ICD-10-CM

## 2023-05-16 LAB — CBC
HCT: 46.7 % — ABNORMAL HIGH (ref 36.0–46.0)
Hemoglobin: 15.1 g/dL — ABNORMAL HIGH (ref 12.0–15.0)
MCH: 28.3 pg (ref 26.0–34.0)
MCHC: 32.3 g/dL (ref 30.0–36.0)
MCV: 87.6 fL (ref 80.0–100.0)
Platelets: 414 10*3/uL — ABNORMAL HIGH (ref 150–400)
RBC: 5.33 MIL/uL — ABNORMAL HIGH (ref 3.87–5.11)
RDW: 17 % — ABNORMAL HIGH (ref 11.5–15.5)
WBC: 14 10*3/uL — ABNORMAL HIGH (ref 4.0–10.5)
nRBC: 0 % (ref 0.0–0.2)

## 2023-05-16 LAB — BASIC METABOLIC PANEL
Anion gap: 14 (ref 5–15)
BUN: 6 mg/dL — ABNORMAL LOW (ref 8–23)
CO2: 22 mmol/L (ref 22–32)
Calcium: 9.1 mg/dL (ref 8.9–10.3)
Chloride: 97 mmol/L — ABNORMAL LOW (ref 98–111)
Creatinine, Ser: 0.58 mg/dL (ref 0.44–1.00)
GFR, Estimated: >60 mL/min (ref >60)
Glucose, Bld: 115 mg/dL — ABNORMAL HIGH (ref 70–99)
Potassium: 4 mmol/L (ref 3.5–5.1)
Sodium: 133 mmol/L — ABNORMAL LOW (ref 135–145)

## 2023-05-16 LAB — URINALYSIS, ROUTINE W REFLEX MICROSCOPIC
Bilirubin Urine: NEGATIVE
Glucose, UA: NEGATIVE mg/dL
Hgb urine dipstick: NEGATIVE
Ketones, ur: NEGATIVE mg/dL
Nitrite: POSITIVE — AB
Protein, ur: NEGATIVE mg/dL
Specific Gravity, Urine: 1.004 — ABNORMAL LOW (ref 1.005–1.030)
WBC, UA: >50 WBC/hpf (ref 0–5)
pH: 5 (ref 5.0–8.0)

## 2023-05-16 MED ORDER — CEPHALEXIN 250 MG PO CAPS
500.0000 mg | ORAL_CAPSULE | Freq: Once | ORAL | Status: AC
  Administered 2023-05-17: 500 mg via ORAL
  Filled 2023-05-16: qty 2

## 2023-05-16 MED ORDER — CEPHALEXIN 500 MG PO CAPS: 500.0000 mg | ORAL_CAPSULE | Freq: Four times a day (QID) | ORAL | 0 refills | Status: DC

## 2023-05-16 NOTE — ED Provider Notes (Signed)
[facility] EMERGENCY DEPARTMENT AT [facility] Provider Note   CSN: [id number] Arrival date & time: 05/16/23  1732     History  Chief Complaint  Patient presents with   urinary catheter problem    [name] is a 67 y.o. female.  HPI Patient states she self caths and has not been able to self cath today. Reports has been urinating throughout the day. Noted urinated on the floor when transferring to the bed. Denies fever, chills, body aches, burning with urination, or pelvic pain or cramping.   H/O of cerebral palsy and long term self cath. Unable to pass catheter all night with mult attempts. Urine builds up in bladder, then when transfers, large volumes of incontinence. Otherwise feels at baseline, denies any constitutional symptoms.    Home Medications Prior to Admission medications   Medication Sig Start Date End Date Taking? Authorizing Provider  cephALEXin (KEFLEX) 500 MG capsule Take 1 capsule (500 mg total) by mouth 4 (four) times daily. 05/16/23  Yes Arby Barrette, MD  acetaminophen-codeine (TYLENOL #3) 300-30 MG tablet Take 1 tablet by mouth every 6 (six) hours as needed for moderate pain.    [provider]  Acetylcarnitine HCl (ACETYL-L-CARNITINE HCL) POWD 1,000 mg by Does not apply route daily.    [provider]  albuterol (PROVENTIL HFA;VENTOLIN HFA) 108 (90 Base) MCG/ACT inhaler Inhale 1-2 puffs into the lungs every 6 (six) hours as needed for wheezing or shortness of breath.    [provider]  amitriptyline (ELAVIL) 100 MG tablet Take 1 tablet (100 mg total) by mouth at bedtime. 04/25/23   Cleotis Nipper, MD  AQUALANCE LANCETS 30G MISC  04/25/18   [provider]  Ascorbic Acid (VITAMIN C) 500 MG CAPS Take 500 mg by mouth at bedtime.     [provider]  aspirin EC 81 MG tablet Take 81 mg by mouth daily. 08/18/20   [provider]  atorvastatin (LIPITOR) 40 MG tablet 1 tablet    [provider]  B Complex Vitamins (B COMPLEX 1 PO) Take by mouth. 03/12/14   [provider]  Blood Glucose Calibration (OT ULTRA/FASTTK CNTRL SOLN) SOLN  05/05/18   [provider]  Blood Glucose Monitoring Suppl (ONE TOUCH ULTRA 2) w/Device KIT  05/05/18   [provider]  busPIRone (BUSPAR) 10 MG tablet TAKE 1 TABLET(10 MG) BY MOUTH TWICE DAILY 04/25/23   Arfeen, Phillips Grout, MD  fluticasone (FLONASE) 50 MCG/ACT nasal spray 2 sprays by Each Nare route daily.    [provider]  gabapentin (NEURONTIN) 600 MG tablet Take 600 mg by mouth 2 (two) times daily. 02/20/23   Hamrick, Maura L, MD  glipiZIDE (GLUCOTROL XL) 10 MG 24 hr tablet TK 1 T PO QAM FOR DIABETES 04/08/19   [provider]  lamoTRIgine (LAMICTAL) 200 MG tablet TAKE 1 TABLET(200 MG) BY MOUTH DAILY. 04/25/23   Cleotis Nipper, MD  Lancet Devices (ADJUSTABLE LANCING DEVICE) MISC  05/05/18   [provider]  magnesium 30 MG tablet Take 30 mg by mouth daily.    [provider]  metFORMIN (GLUCOPHAGE-XR) 500 MG 24 hr tablet Take 500 mg by mouth daily with breakfast. IN THE MORNING.    [provider]  Multiple Vitamins-Minerals (WOMENS 50+ MULTI VITAMIN PO) Take by mouth.    [provider]  Niacin-Polypodium Leucotomos (HELIOCARE ADVANCED) 250-120 MG CAPS Take by mouth daily.    [provider]  ONE [facility]  ULTRA TEST test strip  05/05/18   [provider]  orlistat (XENICAL) 120 MG capsule Take by mouth.    [provider]  pilocarpine (SALAGEN) 5 MG tablet Take by mouth.    [provider]  Polyethyl Glycol-Propyl Glycol 0.4-0.3 % SOLN Place 1-2 drops into both eyes 3 (three) times daily as needed (for dry eyes.).    [provider]  potassium chloride (KLOR-CON) 10 MEQ tablet Take 10 mEq by mouth daily. 05/16/21   [provider]  potassium chloride (KLOR-CON) 8 MEQ tablet Take 8 mEq by mouth daily.    [provider]  PREBIOTIC PRODUCT PO Take by mouth.    [provider]  Propylene Glycol-Glycerin 1-0.3 % SOLN Apply 1 drop to eye in the morning and at bedtime.    [provider]  risedronate (ACTONEL) 150 MG tablet Take 150 mg by mouth every morning. 06/09/20   [provider]  spironolactone (ALDACTONE) 100 MG tablet Take 100 mg by mouth daily.  01/28/15   [provider]      Allergies    Miconazole nitrate and Sulfonamide derivatives    Review of Systems   Review of Systems  Physical Exam Updated Vital Signs BP 137/74 (BP Location: Right Arm)   Pulse 84   Temp 97.9 F (36.6 C) (Oral)   Resp 16   Ht 5\' 6"  (1.676 m)   Wt 113.4 kg   SpO2 99%   BMI 40.35 kg/m  Physical Exam Constitutional:      General: She is not in acute distress.    Appearance: She is not toxic-appearing.  HENT:     Mouth/Throat:     Pharynx: Oropharynx is clear.  Cardiovascular:     Rate and Rhythm: Normal rate and regular rhythm.  Pulmonary:     Effort: Pulmonary effort is normal.     Breath sounds: Normal breath sounds.  Abdominal:     Comments: Distended, firm lower abdo but denies pain  Musculoskeletal:     Right lower leg: Edema present.     Left lower leg: Edema present.  Skin:    General: Skin is warm and dry.  Neurological:     Mental Status: She is alert and oriented to person, place, and time.     Motor: Weakness present.     Comments: CP with LE dysfunction baseline  Psychiatric:        Mood and Affect: Mood normal.     ED Results / Procedures / Treatments   Labs (all labs ordered are listed, but only abnormal results are displayed) Labs Reviewed  URINALYSIS, ROUTINE W REFLEX MICROSCOPIC - Abnormal; Notable for the following components:      Result Value   APPearance CLOUDY (*)    Specific Gravity, Urine 1.004 (*)    Nitrite POSITIVE (*)    Leukocytes,Ua LARGE (*)    Bacteria, UA FEW (*)    All other components within normal limits  BASIC METABOLIC  PANEL - Abnormal; Notable for the following components:   Sodium 133 (*)    Chloride 97 (*)    Glucose, Bld 115 (*)    BUN 6 (*)    All other components within normal limits  CBC - Abnormal; Notable for the following components:   WBC 14.0 (*)    RBC 5.33 (*)    Hemoglobin 15.1 (*)    HCT 46.7 (*)    RDW 17.0 (*)    Platelets 414 (*)  All other components within normal limits    EKG None  Radiology No results found.  Procedures Procedures    Medications Ordered in ED Medications  cephALEXin (KEFLEX) capsule 500 mg (500 mg Oral Given 05/17/23 0004)    ED Course/ Medical Decision Making/ A&P                             Medical Decision Making Amount and/or Complexity of Data Reviewed Labs: ordered.  Risk Prescription drug management.   Patient with cerebral palsy and long h/o self catheterization. Exam and hx c/w urinary retention without systemic sx.  Once bladder decompressed, low abdomen soft.  Urine grossly positive with good specimen from new catheter. Review of EMR does not suggest chronic colonization. In setting of mutiple unsuccessful attempts to self cath with high risk for infection will tx with keflex based on most recent CX positive urinalysis Klebsiella sensitive to  cephalosporins.       Final Clinical Impression(s) / ED Diagnoses Final diagnoses:  Urinary retention  Acute cystitis without hematuria  Cerebral palsy, unspecified type (HCC)    Rx / DC Orders ED Discharge Orders          Ordered    cephALEXin (KEFLEX) 500 MG capsule  4 times daily        05/16/23 2346              Arby Barrette, MD 05/17/23 1352

## 2023-05-16 NOTE — ED Triage Notes (Signed)
Pt states she does int urinary cath to herself and hasn't been able to get her urinary catheter to go in since last night. Pt denies urinary issues.

## 2023-05-16 NOTE — ED Notes (Signed)
Patient states she self caths and has not been able to self cath today. Reports has been urinating throughout the day. Noted urinated on the floor when transferring to the bed. Denies fever, chills, body aches, burning with urination, or pelvic pain or cramping.

## 2023-05-16 NOTE — Discharge Instructions (Signed)
1.  Schedule appointment with your family doctor for recheck. 2.  You will need a recheck with your urologist for urinary retention. 3.  Take Keflex 4 times a day as prescribed. 4.  Return to the emergency department if you get new worsening or concerning symptoms.

## 2023-05-17 DIAGNOSIS — N3000 Acute cystitis without hematuria: Secondary | ICD-10-CM | POA: Diagnosis not present

## 2023-05-21 DIAGNOSIS — N3021 Other chronic cystitis with hematuria: Secondary | ICD-10-CM | POA: Diagnosis not present

## 2023-05-21 DIAGNOSIS — R3914 Feeling of incomplete bladder emptying: Secondary | ICD-10-CM | POA: Diagnosis not present

## 2023-05-23 ENCOUNTER — Telehealth: Payer: Self-pay | Admitting: *Deleted

## 2023-05-23 NOTE — Telephone Encounter (Signed)
Transition Care Management Unsuccessful Follow-up Telephone Call  Date of discharge and from where:  [facility] 05/17/2023  Attempts:  2nd Attempt  Reason for unsuccessful TCM follow-up call:  No answer/busy

## 2023-05-23 NOTE — Telephone Encounter (Signed)
Transition Care Management Unsuccessful Follow-up Telephone Call  Date of discharge and from where:  [facility] 05/17/2023  Attempts:  1st Attempt  Reason for unsuccessful TCM follow-up call:  Left voice message

## 2023-05-29 ENCOUNTER — Ambulatory Visit: Payer: Medicaid Other | Admitting: Podiatry

## 2023-05-30 ENCOUNTER — Ambulatory Visit (INDEPENDENT_AMBULATORY_CARE_PROVIDER_SITE_OTHER): Payer: 59 | Admitting: Obstetrics and Gynecology

## 2023-05-30 ENCOUNTER — Encounter: Payer: Self-pay | Admitting: Obstetrics and Gynecology

## 2023-05-30 VITALS — BP 118/76 | HR 88

## 2023-05-30 DIAGNOSIS — N950 Postmenopausal bleeding: Secondary | ICD-10-CM

## 2023-05-30 NOTE — Progress Notes (Signed)
NEW GYNECOLOGY PATIENT Patient name: [name] MRN [id number]  Date of birth: 1956-09-25 Chief Complaint:   abnormal vaginal bleeding     History:  [name] is a 67 y.o. G3P2 being seen today for aub.  Had a single episode of "quite a bit of blood" seen in the bed and not sure if it has happened again (step dad reported to daughter). Has been in meonopause for a few years. Not sure how much bleeding, more than spotting - had some on the bed and legs. Woek  up and noticed in the monring, no pain at hte time and no pain at this time. No new medication started including blood thinners.   Prior pap smear had precancer - had a procedure to remove part of the cervix back in the 80s  Not mobile from waist down.   N oproblems with anesthesia in the past 2 babies - vaginal deliveries and 1 grandchild Midline incision for ovarian cystectomy    Gynecologic History No LMP recorded. Patient is postmenopausal. Contraception: post menopausal status Last Pap:     Component Value Date/Time   DIAGPAP  01/06/2020 1540    - Negative for intraepithelial lesion or malignancy (NILM)   HPVHIGH Negative 01/06/2020 1540   ADEQPAP  01/06/2020 1540    Satisfactory for evaluation; transformation zone component PRESENT.    High Risk HPV: Positive  Adequacy:  Satisfactory for evaluation, transformation zone component PRESENT  Diagnosis:  Atypical squamous cells of undetermined significance (ASC-US)  Last Mammogram: none seen  Last Colonoscopy: 2018  Obstetric History OB History  Gravida Para Term Preterm AB Living  3 2       2   SAB IAB Ectopic Multiple Live Births               # Outcome Date GA Lbr Len/2nd Weight Sex Type Anes PTL Lv  3 Para           2 Para           1 Gravida             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HCC)    Anxiety    Arthritis    Bacterial infection    Benign paroxysmal positional vertigo 02/24/2013   Bipolar disorder (HCC)     Bladder infection    Cancer (HCC)    skin cancer   Cerebral palsy (HCC)    Colitis    COPD (chronic obstructive pulmonary disease) (HCC)    mild emphysema   Depression    bi polar   Diabetes mellitus, type II (HCC)    Dysplasia of cervix    GERD (gastroesophageal reflux disease)    Headache(784.0)    High cholesterol    History of chicken pox    History of measles    History of mumps    IBS (irritable bowel syndrome)    Kidney infection    Melanoma in situ (HCC)    Neck mass    Neuromuscular disorder (HCC)    Ovarian cyst    Poor circulation    Stenosis, cervical spine 05/16   Trichomonas    UTI (urinary tract infection)    current   Yeast infection     Past Surgical History:  Procedure Laterality Date   ANTERIOR CERVICAL DECOMP/DISCECTOMY FUSION N/A 05/25/2016   Procedure: Cervical five-six Anterior cervical decompression/diskectomy/fusion;  Surgeon: Coletta Memos, MD;  Location: [facility];  Service: Neurosurgery;  Laterality:  N/A;   APPENDECTOMY     CARPAL TUNNEL RELEASE     CARPAL TUNNEL RELEASE Right 08/02/2017   Procedure: CARPAL TUNNEL RELEASE RIGHT;  Surgeon: Coletta Memos, MD;  Location: [facility] OR;  Service: Neurosurgery;  Laterality: Right;  CARPAL TUNNEL RELEASE RIGHT   CARPAL TUNNEL RELEASE Left 11/29/2017   Procedure: CARPAL TUNNEL RELEASE LEFT;  Surgeon: Coletta Memos, MD;  Location: [facility];  Service: Neurosurgery;  Laterality: Left;  CARPAL TUNNEL RELEASE LEFT   COLONOSCOPY WITH PROPOFOL N/A 11/29/2016   Procedure: COLONOSCOPY WITH PROPOFOL;  Surgeon: Rachael Fee, MD;  Location: [facility];  Service: Endoscopy;  Laterality: N/A;   ESOPHAGOGASTRODUODENOSCOPY (EGD) WITH PROPOFOL N/A 11/29/2016   Procedure: ESOPHAGOGASTRODUODENOSCOPY (EGD) WITH PROPOFOL;  Surgeon: Rachael Fee, MD;  Location: [facility];  Service: Endoscopy;  Laterality: N/A;   IR INJECT/THERA/INC NEEDLE/CATH/PLC EPI/CERV/THOR W/IMG  03/04/2020   JOINT REPLACEMENT     knee rotation     LEG  TENDON SURGERY     OVARIAN CYST REMOVAL     REPLACEMENT TOTAL KNEE BILATERAL  2006   rotator cuff surgery      Current Outpatient Medications on File Prior to Visit  Medication Sig Dispense Refill   glipiZIDE (GLUCOTROL XL) 5 MG 24 hr tablet Take 5 mg by mouth every morning.     acetaminophen-codeine (TYLENOL #3) 300-30 MG tablet Take 1 tablet by mouth every 6 (six) hours as needed for moderate pain.     Acetylcarnitine HCl (ACETYL-L-CARNITINE HCL) POWD 1,000 mg by Does not apply route daily.     albuterol (PROVENTIL HFA;VENTOLIN HFA) 108 (90 Base) MCG/ACT inhaler Inhale 1-2 puffs into the lungs every 6 (six) hours as needed for wheezing or shortness of breath.     amitriptyline (ELAVIL) 100 MG tablet Take 1 tablet (100 mg total) by mouth at bedtime. 30 tablet 1   AQUALANCE LANCETS 30G MISC      Ascorbic Acid (VITAMIN C) 500 MG CAPS Take 500 mg by mouth at bedtime.      aspirin EC 81 MG tablet Take 81 mg by mouth daily.     atorvastatin (LIPITOR) 40 MG tablet 1 tablet     B Complex Vitamins (B COMPLEX 1 PO) Take by mouth.     Blood Glucose Calibration (OT ULTRA/FASTTK CNTRL SOLN) SOLN      Blood Glucose Monitoring Suppl (ONE TOUCH ULTRA 2) w/Device KIT      busPIRone (BUSPAR) 10 MG tablet TAKE 1 TABLET(10 MG) BY MOUTH TWICE DAILY 60 tablet 1   cephALEXin (KEFLEX) 500 MG capsule Take 1 capsule (500 mg total) by mouth 4 (four) times daily. 28 capsule 0   fluticasone (FLONASE) 50 MCG/ACT nasal spray 2 sprays by Each Nare route daily.     gabapentin (NEURONTIN) 600 MG tablet Take 600 mg by mouth 2 (two) times daily.     glipiZIDE (GLUCOTROL XL) 10 MG 24 hr tablet TK 1 T PO QAM FOR DIABETES     lamoTRIgine (LAMICTAL) 200 MG tablet TAKE 1 TABLET(200 MG) BY MOUTH DAILY. 30 tablet 1   Lancet Devices (ADJUSTABLE LANCING DEVICE) MISC      magnesium 30 MG tablet Take 30 mg by mouth daily.     metFORMIN (GLUCOPHAGE-XR) 500 MG 24 hr tablet Take 500 mg by mouth daily with breakfast. IN THE MORNING.      Multiple Vitamins-Minerals (WOMENS 50+ MULTI VITAMIN PO) Take by mouth.     Niacin-Polypodium Leucotomos (HELIOCARE ADVANCED) 250-120 MG CAPS Take by mouth  daily.     ONE TOUCH ULTRA TEST test strip      orlistat (XENICAL) 120 MG capsule Take by mouth.     pilocarpine (SALAGEN) 5 MG tablet Take by mouth.     Polyethyl Glycol-Propyl Glycol 0.4-0.3 % SOLN Place 1-2 drops into both eyes 3 (three) times daily as needed (for dry eyes.).     potassium chloride (KLOR-CON) 10 MEQ tablet Take 10 mEq by mouth daily.     potassium chloride (KLOR-CON) 8 MEQ tablet Take 8 mEq by mouth daily.     PREBIOTIC PRODUCT PO Take by mouth.     Propylene Glycol-Glycerin 1-0.3 % SOLN Apply 1 drop to eye in the morning and at bedtime.     risedronate (ACTONEL) 150 MG tablet Take 150 mg by mouth every morning.     spironolactone (ALDACTONE) 100 MG tablet Take 100 mg by mouth daily.   5   No current facility-administered medications on file prior to visit.    Allergies  Allergen Reactions   Miconazole     Other Reaction(s): burns   Miconazole Nitrate Hives and Itching   Sulfonamide Derivatives Rash    Social History:  reports that she quit smoking about 23 years ago. Her smoking use included cigarettes. She started smoking about 54 years ago. She has a 31 pack-year smoking history. She has never used smokeless tobacco. She reports current alcohol use. She reports that she does not use drugs.  Family History  Problem Relation Age of Onset   Suicidality Father    Depression Father    Alcohol abuse Father    Emphysema Father        smoked   Drug abuse Brother    Alcohol abuse Brother    Esophageal cancer Brother    Depression Maternal Aunt    Alcohol abuse Brother    Anxiety disorder Daughter    Depression Daughter    Suicidality Daughter    Alcohol abuse Daughter    Drug abuse Son    Alcohol abuse Son    Emphysema Sister        smoked    The following portions of the patient's history were  reviewed and updated as appropriate: allergies, current medications, past family history, past medical history, past social history, past surgical history and problem list.  Review of Systems Pertinent items noted in HPI and remainder of comprehensive ROS otherwise negative.  Physical Exam:  BP 118/76   Pulse 88  Physical Exam Vitals and nursing note reviewed.  Constitutional:      Appearance: Normal appearance.  Cardiovascular:     Rate and Rhythm: Normal rate.  Pulmonary:     Effort: Pulmonary effort is normal.     Breath sounds: Normal breath sounds.  Neurological:     General: No focal deficit present.     Mental Status: She is alert and oriented to person, place, and time.  Psychiatric:        Mood and Affect: Mood normal.        Behavior: Behavior normal.        Thought Content: Thought content normal.        Judgment: Judgment normal.        Assessment and Plan:   1. Postmenopause bleeding AUB labs as well as pelvic US to assess endometrial stripe. If thickened or appears to be mass, will plan for sampling in OR since patient not presently mobile from the waist down. Daughter is primary person to communicate  with regarding results and coordinating care.  - TSH Rfx on Abnormal to Free T4 - Hemoglobin A1c - US PELVIC COMPLETE WITH TRANSVAGINAL; Future    Routine preventative health maintenance measures emphasized. Please refer to After Visit Summary for other counseling recommendations.   Follow-up: No follow-ups on file.      [name], MD Obstetrician & Gynecologist, [facility] [employer], [facility]

## 2023-05-31 DIAGNOSIS — G9389 Other specified disorders of brain: Secondary | ICD-10-CM | POA: Diagnosis not present

## 2023-05-31 DIAGNOSIS — R569 Unspecified convulsions: Secondary | ICD-10-CM | POA: Diagnosis not present

## 2023-05-31 DIAGNOSIS — E119 Type 2 diabetes mellitus without complications: Secondary | ICD-10-CM | POA: Diagnosis not present

## 2023-05-31 DIAGNOSIS — T426X1A Poisoning by other antiepileptic and sedative-hypnotic drugs, accidental (unintentional), initial encounter: Secondary | ICD-10-CM | POA: Diagnosis not present

## 2023-05-31 DIAGNOSIS — Z96653 Presence of artificial knee joint, bilateral: Secondary | ICD-10-CM | POA: Diagnosis not present

## 2023-05-31 DIAGNOSIS — M4802 Spinal stenosis, cervical region: Secondary | ICD-10-CM | POA: Diagnosis not present

## 2023-05-31 DIAGNOSIS — Z7982 Long term (current) use of aspirin: Secondary | ICD-10-CM | POA: Diagnosis not present

## 2023-05-31 DIAGNOSIS — T426X5A Adverse effect of other antiepileptic and sedative-hypnotic drugs, initial encounter: Secondary | ICD-10-CM | POA: Diagnosis not present

## 2023-05-31 DIAGNOSIS — G809 Cerebral palsy, unspecified: Secondary | ICD-10-CM | POA: Diagnosis not present

## 2023-05-31 DIAGNOSIS — G4700 Insomnia, unspecified: Secondary | ICD-10-CM | POA: Diagnosis not present

## 2023-05-31 DIAGNOSIS — R9401 Abnormal electroencephalogram [EEG]: Secondary | ICD-10-CM | POA: Diagnosis not present

## 2023-05-31 DIAGNOSIS — R404 Transient alteration of awareness: Secondary | ICD-10-CM | POA: Diagnosis not present

## 2023-05-31 DIAGNOSIS — Z7984 Long term (current) use of oral hypoglycemic drugs: Secondary | ICD-10-CM | POA: Diagnosis not present

## 2023-05-31 DIAGNOSIS — G4733 Obstructive sleep apnea (adult) (pediatric): Secondary | ICD-10-CM | POA: Diagnosis not present

## 2023-05-31 DIAGNOSIS — Z882 Allergy status to sulfonamides status: Secondary | ICD-10-CM | POA: Diagnosis not present

## 2023-05-31 DIAGNOSIS — Z79899 Other long term (current) drug therapy: Secondary | ICD-10-CM | POA: Diagnosis not present

## 2023-05-31 DIAGNOSIS — E1142 Type 2 diabetes mellitus with diabetic polyneuropathy: Secondary | ICD-10-CM | POA: Diagnosis not present

## 2023-05-31 DIAGNOSIS — G808 Other cerebral palsy: Secondary | ICD-10-CM | POA: Diagnosis not present

## 2023-06-01 DIAGNOSIS — R569 Unspecified convulsions: Secondary | ICD-10-CM | POA: Diagnosis not present

## 2023-06-02 DIAGNOSIS — R569 Unspecified convulsions: Secondary | ICD-10-CM | POA: Diagnosis not present

## 2023-06-03 ENCOUNTER — Telehealth: Payer: Self-pay | Admitting: General Practice

## 2023-06-03 ENCOUNTER — Ambulatory Visit: Payer: 59 | Admitting: Podiatry

## 2023-06-03 DIAGNOSIS — R404 Transient alteration of awareness: Secondary | ICD-10-CM | POA: Diagnosis not present

## 2023-06-03 NOTE — Telephone Encounter (Signed)
Called patient's daughter Carollee Herter and informed her of normal blood work results for her mother. She verbalized understanding.

## 2023-06-03 NOTE — Telephone Encounter (Signed)
-----   Message from [name] sent at 05/31/2023 12:57 PM EDT ----- Notify patient (via daughter [name] preferred) that her labs are normal.

## 2023-06-10 ENCOUNTER — Ambulatory Visit (INDEPENDENT_AMBULATORY_CARE_PROVIDER_SITE_OTHER): Payer: 59 | Admitting: Podiatry

## 2023-06-10 ENCOUNTER — Encounter: Payer: Self-pay | Admitting: Podiatry

## 2023-06-10 DIAGNOSIS — B351 Tinea unguium: Secondary | ICD-10-CM

## 2023-06-10 DIAGNOSIS — M79674 Pain in right toe(s): Secondary | ICD-10-CM

## 2023-06-10 DIAGNOSIS — M79675 Pain in left toe(s): Secondary | ICD-10-CM

## 2023-06-11 ENCOUNTER — Ambulatory Visit (HOSPITAL_COMMUNITY): Payer: 59

## 2023-06-12 NOTE — Progress Notes (Signed)
Subjective:   Patient ID: [name], female   DOB: 67 y.o.   MRN: [id number]   HPI Patient states her nails have been thick and are pulling away from the bed and painful   ROS      Objective:  Physical Exam  Neurovascular status intact thick brittle nailbeds 1-5 both feet painful     Assessment:  Mycotic nail infection 1-5 both feet with pain     Plan:  Debridement nailbeds 1-5 both feet nitrogen to bleeding reappoint routine care

## 2023-06-14 DIAGNOSIS — G4733 Obstructive sleep apnea (adult) (pediatric): Secondary | ICD-10-CM | POA: Diagnosis not present

## 2023-06-14 DIAGNOSIS — G4761 Periodic limb movement disorder: Secondary | ICD-10-CM | POA: Diagnosis not present

## 2023-06-20 ENCOUNTER — Ambulatory Visit (HOSPITAL_COMMUNITY): Payer: 59

## 2023-06-20 DIAGNOSIS — G809 Cerebral palsy, unspecified: Secondary | ICD-10-CM | POA: Diagnosis not present

## 2023-06-20 DIAGNOSIS — G4761 Periodic limb movement disorder: Secondary | ICD-10-CM | POA: Diagnosis not present

## 2023-06-20 DIAGNOSIS — G4733 Obstructive sleep apnea (adult) (pediatric): Secondary | ICD-10-CM | POA: Diagnosis not present

## 2023-06-24 ENCOUNTER — Other Ambulatory Visit (HOSPITAL_COMMUNITY): Payer: Self-pay | Admitting: Psychiatry

## 2023-06-24 DIAGNOSIS — F319 Bipolar disorder, unspecified: Secondary | ICD-10-CM

## 2023-06-24 DIAGNOSIS — F419 Anxiety disorder, unspecified: Secondary | ICD-10-CM

## 2023-06-25 ENCOUNTER — Telehealth (HOSPITAL_COMMUNITY): Payer: Self-pay

## 2023-06-25 NOTE — Telephone Encounter (Signed)
Patients daughter called, patient gave me permission to speak with her. Patient was inpatient earlier this month and her medications have been changed. Patient has a follow up with you next month and needs refills. Medication changes are as follows;  Amitriptyline 50 mg 1 po at bedtime Gabapentin 100 mg Bid Lamictal 150 mg 1 po every day And buspar is the same at 10 mg Bid  Patients daughter Carollee Herter states that her mom is doing much better now since the admission. Please review and advise, thank you

## 2023-06-25 NOTE — Telephone Encounter (Signed)
She needs to be seen before we change the medication.  I will need a discharge summary if she was admitted on psychiatry inpatient.  There is opening this Thursday and she can be seen.

## 2023-06-26 ENCOUNTER — Telehealth (HOSPITAL_COMMUNITY): Payer: Self-pay

## 2023-06-26 ENCOUNTER — Other Ambulatory Visit (HOSPITAL_COMMUNITY): Payer: Self-pay

## 2023-06-26 DIAGNOSIS — F419 Anxiety disorder, unspecified: Secondary | ICD-10-CM

## 2023-06-26 DIAGNOSIS — F319 Bipolar disorder, unspecified: Secondary | ICD-10-CM

## 2023-06-26 NOTE — Telephone Encounter (Signed)
Yes I reviewed the message and I also sent you back that she need to be seen since she was given the diagnosis of polypharmacy and taking too much medication to cause seizure-like activity.  I have recommended to see tomorrow Thursday when there was opening.  However look like that her appointment coming up in 12.  I do agree the medicine needs to be reduced.   Please call a 15-day supply of amitriptyline 50 mg at bedtime, Lamictal 150 mg daily.  They also reduce gabapentin to 100 mg twice a day.  She should keep the appointment for September 12.

## 2023-06-26 NOTE — Telephone Encounter (Signed)
I sent you a message about this patients medication yesterday. She was in the hospital for seizure - like activity (see the encounter in EPIC) they reduced a couple of her medications: Amitriptyline 50 mg 1 po at bedtime (from 100 mg) Lamictal 150 mg 1 po every day (from 200 mg)   P  Patients daughter states that her mother has been on these dosages since she got home and is doing well but they do not have enough to get her to her appointment. She dose not want to run out and have her mother destabilize. Patient has a follow up with you on 9/12 in person. Please review and advise, thank you

## 2023-06-27 ENCOUNTER — Ambulatory Visit (HOSPITAL_COMMUNITY)
Admission: RE | Admit: 2023-06-27 | Discharge: 2023-06-27 | Disposition: A | Discharge status: Home / Self Care | Payer: 59 | Source: Ambulatory Visit | Attending: Obstetrics and Gynecology | Admitting: Obstetrics and Gynecology

## 2023-06-27 ENCOUNTER — Other Ambulatory Visit (HOSPITAL_COMMUNITY): Payer: Self-pay

## 2023-06-27 DIAGNOSIS — R9389 Abnormal findings on diagnostic imaging of other specified body structures: Secondary | ICD-10-CM | POA: Diagnosis not present

## 2023-06-27 DIAGNOSIS — N950 Postmenopausal bleeding: Secondary | ICD-10-CM | POA: Diagnosis present

## 2023-06-27 MED ORDER — AMITRIPTYLINE HCL 50 MG PO TABS: 50.0000 mg | ORAL_TABLET | Freq: Every day | ORAL | 0 refills | Status: DC

## 2023-06-27 MED ORDER — LAMOTRIGINE 150 MG PO TABS: 150.0000 mg | ORAL_TABLET | Freq: Every day | ORAL | 0 refills | Status: DC

## 2023-07-02 DIAGNOSIS — Z981 Arthrodesis status: Secondary | ICD-10-CM | POA: Diagnosis not present

## 2023-07-02 DIAGNOSIS — M4802 Spinal stenosis, cervical region: Secondary | ICD-10-CM | POA: Diagnosis not present

## 2023-07-02 DIAGNOSIS — G4733 Obstructive sleep apnea (adult) (pediatric): Secondary | ICD-10-CM | POA: Diagnosis not present

## 2023-07-02 DIAGNOSIS — G4761 Periodic limb movement disorder: Secondary | ICD-10-CM | POA: Diagnosis not present

## 2023-07-02 DIAGNOSIS — R269 Unspecified abnormalities of gait and mobility: Secondary | ICD-10-CM | POA: Diagnosis not present

## 2023-07-02 DIAGNOSIS — R569 Unspecified convulsions: Secondary | ICD-10-CM | POA: Diagnosis not present

## 2023-07-02 DIAGNOSIS — R413 Other amnesia: Secondary | ICD-10-CM | POA: Diagnosis not present

## 2023-07-04 ENCOUNTER — Ambulatory Visit (HOSPITAL_COMMUNITY): Payer: 59 | Admitting: Psychiatry

## 2023-07-08 ENCOUNTER — Telehealth: Payer: Self-pay

## 2023-07-08 DIAGNOSIS — N3021 Other chronic cystitis with hematuria: Secondary | ICD-10-CM | POA: Diagnosis not present

## 2023-07-08 DIAGNOSIS — R3914 Feeling of incomplete bladder emptying: Secondary | ICD-10-CM | POA: Diagnosis not present

## 2023-07-08 NOTE — Telephone Encounter (Signed)
-----   Message from Jerene Bears sent at 07/05/2023  2:22 PM EDT ----- Please let pt know her endometrium on ultrasound is thickened and there is possibly endometrial polyp(s) present.  She needs to have this tissue evaluated to ensure no abnormal findings. Will make sure Dr. Briscoe Deutscher is aware and this will be scheduled for her in the OR per DR. Ajewole's note.

## 2023-07-08 NOTE — Telephone Encounter (Signed)
Called Pt go over US findings, explained to her endometrium on ultrasound is thickened and there is possibly endometrial polyp(s) present. She needs to have this tissue evaluated to ensure no abnormal findings . Dr. Briscoe Deutscher will go over more thorough at her next office visit on 07/31/23. Pt verbalized understanding.

## 2023-07-09 DIAGNOSIS — N3021 Other chronic cystitis with hematuria: Secondary | ICD-10-CM | POA: Diagnosis not present

## 2023-07-09 DIAGNOSIS — R3914 Feeling of incomplete bladder emptying: Secondary | ICD-10-CM | POA: Diagnosis not present

## 2023-07-10 DIAGNOSIS — R3914 Feeling of incomplete bladder emptying: Secondary | ICD-10-CM | POA: Diagnosis not present

## 2023-07-10 DIAGNOSIS — N3021 Other chronic cystitis with hematuria: Secondary | ICD-10-CM | POA: Diagnosis not present

## 2023-07-11 ENCOUNTER — Encounter (HOSPITAL_COMMUNITY): Payer: Self-pay | Admitting: Psychiatry

## 2023-07-11 ENCOUNTER — Other Ambulatory Visit: Payer: Self-pay

## 2023-07-11 ENCOUNTER — Ambulatory Visit (HOSPITAL_BASED_OUTPATIENT_CLINIC_OR_DEPARTMENT_OTHER): Payer: 59 | Admitting: Psychiatry

## 2023-07-11 VITALS — BP 118/79 | HR 87 | Ht 67.0 in | Wt 250.0 lb

## 2023-07-11 DIAGNOSIS — R413 Other amnesia: Secondary | ICD-10-CM | POA: Diagnosis not present

## 2023-07-11 DIAGNOSIS — F319 Bipolar disorder, unspecified: Secondary | ICD-10-CM

## 2023-07-11 DIAGNOSIS — F419 Anxiety disorder, unspecified: Secondary | ICD-10-CM

## 2023-07-11 MED ORDER — AMITRIPTYLINE HCL 50 MG PO TABS
50.0000 mg | ORAL_TABLET | Freq: Every day | ORAL | 0 refills | Status: DC
Start: 2023-07-11 — End: 2023-10-03

## 2023-07-11 MED ORDER — LAMOTRIGINE 200 MG PO TABS
200.0000 mg | ORAL_TABLET | Freq: Every day | ORAL | 0 refills | Status: DC
Start: 2023-07-11 — End: 2023-10-03
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
## 2023-07-21 DIAGNOSIS — G809 Cerebral palsy, unspecified: Secondary | ICD-10-CM | POA: Diagnosis not present

## 2023-07-25 ENCOUNTER — Other Ambulatory Visit: Payer: Self-pay

## 2023-07-25 ENCOUNTER — Encounter (HOSPITAL_COMMUNITY): Payer: Self-pay

## 2023-07-25 ENCOUNTER — Emergency Department (HOSPITAL_COMMUNITY)
Admission: EM | Admit: 2023-07-25 | Discharge: 2023-07-26 | Disposition: A | Discharge status: Home / Self Care | Payer: 59 | Attending: Emergency Medicine | Admitting: Emergency Medicine

## 2023-07-25 DIAGNOSIS — N390 Urinary tract infection, site not specified: Secondary | ICD-10-CM | POA: Insufficient documentation

## 2023-07-25 DIAGNOSIS — Z7982 Long term (current) use of aspirin: Secondary | ICD-10-CM | POA: Insufficient documentation

## 2023-07-25 DIAGNOSIS — R109 Unspecified abdominal pain: Secondary | ICD-10-CM | POA: Diagnosis present

## 2023-07-25 DIAGNOSIS — R6889 Other general symptoms and signs: Secondary | ICD-10-CM | POA: Diagnosis not present

## 2023-07-25 DIAGNOSIS — M549 Dorsalgia, unspecified: Secondary | ICD-10-CM | POA: Diagnosis not present

## 2023-07-25 DIAGNOSIS — Z743 Need for continuous supervision: Secondary | ICD-10-CM | POA: Diagnosis not present

## 2023-07-25 LAB — URINALYSIS, ROUTINE W REFLEX MICROSCOPIC
Bilirubin Urine: NEGATIVE
Glucose, UA: NEGATIVE mg/dL
Hgb urine dipstick: NEGATIVE
Ketones, ur: NEGATIVE mg/dL
Nitrite: POSITIVE — AB
Protein, ur: NEGATIVE mg/dL
Specific Gravity, Urine: 1.012 (ref 1.005–1.030)
pH: 7 (ref 5.0–8.0)

## 2023-07-25 LAB — BASIC METABOLIC PANEL
Anion gap: 12 (ref 5–15)
BUN: 13 mg/dL (ref 8–23)
CO2: 27 mmol/L (ref 22–32)
Calcium: 8.8 mg/dL — ABNORMAL LOW (ref 8.9–10.3)
Chloride: 98 mmol/L (ref 98–111)
Creatinine, Ser: 0.84 mg/dL (ref 0.44–1.00)
GFR, Estimated: >60 mL/min (ref >60)
Glucose, Bld: 78 mg/dL (ref 70–99)
Potassium: 4.2 mmol/L (ref 3.5–5.1)
Sodium: 137 mmol/L (ref 135–145)

## 2023-07-25 LAB — CBC
HCT: 45.7 % (ref 36.0–46.0)
Hemoglobin: 14.7 g/dL (ref 12.0–15.0)
MCH: 29.4 pg (ref 26.0–34.0)
MCHC: 32.2 g/dL (ref 30.0–36.0)
MCV: 91.4 fL (ref 80.0–100.0)
Platelets: 381 10*3/uL (ref 150–400)
RBC: 5 MIL/uL (ref 3.87–5.11)
RDW: 14.6 % (ref 11.5–15.5)
WBC: 11.8 10*3/uL — ABNORMAL HIGH (ref 4.0–10.5)
nRBC: 0 % (ref 0.0–0.2)

## 2023-07-25 MED ORDER — SODIUM CHLORIDE 0.9 % IV SOLN
1.0000 g | Freq: Once | INTRAVENOUS | Status: AC
  Administered 2023-07-25: 1 g via INTRAVENOUS
  Filled 2023-07-25: qty 10

## 2023-07-25 MED ORDER — CEFDINIR 300 MG PO CAPS
300.0000 mg | ORAL_CAPSULE | Freq: Two times a day (BID) | ORAL | 0 refills | Status: AC
Start: 2023-07-25 — End: 2023-08-01

## 2023-07-25 NOTE — Discharge Instructions (Addendum)
Today you were treated for UTI.  Please follow-up with your PCP as soon as possible.  Information on how to contact [facility] for an appointment and follow-up is also included, please contact them as soon as possible.   Thank you for letting us treat you today. After reviewing your labs and imaging, I feel you are safe to go home. Please follow up with your PCP in the next several days and provide them with your records from this visit. Return to the Emergency Room if pain becomes severe or symptoms worsen.

## 2023-07-25 NOTE — ED Provider Triage Note (Signed)
Emergency Medicine Provider Triage Evaluation Note  [name] , a 67 y.o. female  was evaluated in triage.  Pt complains of lef flank pain.  Review of Systems  Positive: Dysuria, flank pain Negative: Blood in the urine, blood in stool, abdominal pain, CP, SOB, fever  Physical Exam  BP 135/76 (BP Location: Right Arm)   Pulse 89   Temp 97.8 F (36.6 C) (Oral)   Resp 16   Ht 5\' 7"  (1.702 m)   Wt 113.6 kg   SpO2 99%   BMI 39.22 kg/m  Gen:   Awake, no distress   Resp:  Normal effort  MSK:   Moves extremities without difficulty    Medical Decision Making  Medically screening exam initiated at 5:32 PM.  Appropriate orders placed.  [name] was informed that the remainder of the evaluation will be completed by another provider, this initial triage assessment does not replace that evaluation, and the importance of remaining in the ED until their evaluation is complete.  Patient reports she has symptoms like this in the past when she had a urinary tract infection.  Denies known blood in urine or bowel.   Smitty Knudsen, PA-C 07/25/23 1733

## 2023-07-25 NOTE — ED Provider Notes (Signed)
[place] EMERGENCY DEPARTMENT AT [facility] Provider Note   CSN: [id number] Arrival date & time: 07/25/23  1710     History  Chief Complaint  Patient presents with   Flank Pain    [name] is a 67 y.o. female with a history of cerebral palsy, frequent UTIs presents to the ER for left-sided flank pain x 1 day.  She states she has intermittent, burning pain in her left flank that does not radiate.  She has an indwelling Foley catheter that has been placed for "a few weeks".  She denies fevers, chills, abdominal pain, hematuria, dysuria, or pelvic pain.    Flank Pain Pertinent negatives include no chest pain and no shortness of breath.       Home Medications Prior to Admission medications   Medication Sig Start Date End Date Taking? Authorizing Provider  cefdinir (OMNICEF) 300 MG capsule Take 1 capsule (300 mg total) by mouth 2 (two) times daily for 7 days. 07/25/23 08/01/23 Yes Dolphus Jenny, PA-C  acetaminophen-codeine (TYLENOL #3) 300-30 MG tablet Take 1 tablet by mouth every 6 (six) hours as needed for moderate pain.    [provider]  Acetylcarnitine HCl (ACETYL-L-CARNITINE HCL) POWD 1,000 mg by Does not apply route daily.    [provider]  albuterol (PROVENTIL HFA;VENTOLIN HFA) 108 (90 Base) MCG/ACT inhaler Inhale 1-2 puffs into the lungs every 6 (six) hours as needed for wheezing or shortness of breath.    [provider]  amitriptyline (ELAVIL) 50 MG tablet Take 1 tablet (50 mg total) by mouth at bedtime. 07/11/23   Cleotis Nipper, MD  AQUALANCE LANCETS 30G MISC  04/25/18   [provider]  Ascorbic Acid (VITAMIN C) 500 MG CAPS Take 500 mg by mouth at bedtime.     [provider]  aspirin EC 81 MG tablet Take 81 mg by mouth daily. 08/18/20   [provider]  atorvastatin (LIPITOR) 40 MG tablet 1 tablet    [provider]  B Complex Vitamins (B COMPLEX 1 PO) Take by mouth. 03/12/14   [provider]  Blood Glucose Calibration (OT ULTRA/FASTTK CNTRL SOLN) SOLN  05/05/18   [provider]  Blood Glucose Monitoring Suppl (ONE TOUCH ULTRA 2) w/Device KIT  05/05/18   [provider]  busPIRone (BUSPAR) 10 MG tablet TAKE 1 TABLET(10 MG) BY MOUTH TWICE DAILY 04/25/23   Arfeen, Phillips Grout, MD  fluticasone (FLONASE) 50 MCG/ACT nasal spray 2 sprays by Each Nare route daily.    [provider]  gabapentin (NEURONTIN) 600 MG tablet Take 600 mg by mouth 2 (two) times daily. 02/20/23   Hamrick, Maura L, MD  glipiZIDE (GLUCOTROL XL) 10 MG 24 hr tablet TK 1 T PO QAM FOR DIABETES 04/08/19   [provider]  glipiZIDE (GLUCOTROL XL) 5 MG 24 hr tablet Take 5 mg by mouth every morning. 05/06/23   [provider]  lamoTRIgine (LAMICTAL) 200 MG tablet Take 1 tablet (200 mg total) by mouth daily. 07/11/23   Cleotis Nipper, MD  Lancet Devices (ADJUSTABLE LANCING DEVICE) MISC  05/05/18   [provider]  magnesium 30 MG tablet Take 30 mg by mouth daily.    [provider]  metFORMIN (GLUCOPHAGE-XR) 500 MG 24 hr tablet Take 500 mg by mouth daily with breakfast. IN THE MORNING.    [provider]  Multiple Vitamins-Minerals (WOMENS 50+ MULTI VITAMIN PO) Take by mouth.    [provider]  Niacin-Polypodium Leucotomos (HELIOCARE ADVANCED) 250-120 MG CAPS Take by mouth daily.    [provider]  ONE TOUCH ULTRA TEST test strip  05/05/18   [provider]  orlistat (XENICAL) 120 MG capsule Take by mouth.    [provider]  pilocarpine (SALAGEN) 5 MG tablet Take by mouth.    [provider]  Polyethyl Glycol-Propyl Glycol 0.4-0.3 % SOLN Place 1-2 drops into both eyes 3 (three) times daily as needed (for dry eyes.).    [provider]  potassium chloride (KLOR-CON) 10 MEQ tablet Take 10 mEq by mouth daily. 05/16/21   [provider]  potassium chloride (KLOR-CON) 8 MEQ tablet Take 8 mEq by mouth  daily.    [provider]  PREBIOTIC PRODUCT PO Take by mouth.    [provider]  Propylene Glycol-Glycerin 1-0.3 % SOLN Apply 1 drop to eye in the morning and at bedtime.    [provider]  risedronate (ACTONEL) 150 MG tablet Take 150 mg by mouth every morning. 06/09/20   [provider]  spironolactone (ALDACTONE) 100 MG tablet Take 100 mg by mouth daily.  01/28/15   [provider]      Allergies    Miconazole, Miconazole nitrate, and Sulfonamide derivatives    Review of Systems   Review of Systems  Constitutional:  Negative for chills and fever.  Respiratory:  Negative for shortness of breath.   Cardiovascular:  Negative for chest pain.  Gastrointestinal:  Negative for diarrhea and vomiting.  Genitourinary:  Positive for flank pain. Negative for dysuria and hematuria.  Musculoskeletal:        Left flank pain    Physical Exam Updated Vital Signs BP (!) 141/91   Pulse 82   Temp 97.9 F (36.6 C) (Oral)   Resp 18   Ht 5\' 7"  (1.702 m)   Wt 113.6 kg   SpO2 96%   BMI 39.22 kg/m  Physical Exam Vitals and nursing note reviewed.  Constitutional:      General: She is not in acute distress.    Appearance: She is well-developed. She is obese.  HENT:     Head: Normocephalic and atraumatic.  Eyes:     Conjunctiva/sclera: Conjunctivae normal.  Cardiovascular:     Rate and Rhythm: Normal rate and regular rhythm.     Heart sounds: No murmur heard. Pulmonary:     Effort: Pulmonary effort is normal. No respiratory distress.     Breath sounds: Normal breath sounds.  Abdominal:     Palpations: Abdomen is soft.     Tenderness: There is no abdominal tenderness. There is left CVA tenderness.  Musculoskeletal:        General: No swelling.     Cervical back: Neck supple.     Right lower leg: Edema present.     Left lower leg: Edema present.  Skin:    General: Skin is warm and dry.  Neurological:     Mental Status: She is alert. Mental  status is at baseline.     Motor: Weakness present.     Comments: Cerebral palsy with a lower extremity deficits at baseline  Psychiatric:        Mood and Affect: Mood normal.     ED Results / Procedures / Treatments   Labs (all labs ordered are listed, but only abnormal results are displayed) Labs Reviewed  BASIC METABOLIC PANEL - Abnormal; Notable for the following components:      Result Value  Calcium 8.8 (*)    All other components within normal limits  CBC - Abnormal; Notable for the following components:   WBC 11.8 (*)    All other components within normal limits  URINALYSIS, ROUTINE W REFLEX MICROSCOPIC - Abnormal; Notable for the following components:   APPearance CLOUDY (*)    Nitrite POSITIVE (*)    Leukocytes,Ua MODERATE (*)    Bacteria, UA MANY (*)    All other components within normal limits  URINE CULTURE    EKG None  Radiology No results found.  Procedures Procedures    Medications Ordered in ED Medications  cefTRIAXone (ROCEPHIN) 1 g in sodium chloride 0.9 % 100 mL IVPB (has no administration in time range)    ED Course/ Medical Decision Making/ A&P                                 Medical Decision Making Amount and/or Complexity of Data Reviewed Labs: ordered.  This patient presents to the ED with chief complaint(s) of left leg pain with pertinent past medical history of frequent UTIs, cerebral palsy which further complicates the presenting complaint. The complaint involves an extensive differential diagnosis and also carries with it a high risk of complications and morbidity.    The differential diagnosis includes UTI, pyelonephritis  Additional history obtained: Records reviewed previous lab results  ED Course and Reassessment: Patient given 1 g Rocephin IV  Independent labs interpretation:  The following labs were independently interpreted:  CBC: Slight leukocytosis BMP: Mildly decreased calcium Urinalysis: Cloudy, moderate  leukocytes, positive for nitrates, many bacteria  Consultation: - Consulted or discussed management/test interpretation w/ external professional: None  Consideration for admission or further workup: Considered for admission or further workup however patient has remained afebrile, was given dose of IV antibiotics, and is already planning on seeing urology.         Final Clinical Impression(s) / ED Diagnoses Final diagnoses:  Urinary tract infection without hematuria, site unspecified    Rx / DC Orders ED Discharge Orders          Ordered    cefdinir (OMNICEF) 300 MG capsule  2 times daily        07/25/23 2315              Dolphus Jenny, PA-C 07/25/23 2319    Rondel Baton, MD 07/26/23 1515

## 2023-07-25 NOTE — ED Provider Notes (Signed)
11:38 PM patient seen in conjunction with Jalene Mullet.  Patient with history of indwelling Foley catheter.  She has had UTIs and been septic from these in the past.  She presents with some suprapubic pain and left-sided flank pain that is worse when she turns.  No fevers or vomiting.  She was concerned about a UTI.  She is concerned about getting septic from a UTI.  Labs here show minimally elevated white blood cell count at 11.8, normal renal function, UA with cloudy urine, nitrate positive, 21-50 white blood cells with clumps, many bacteria, clean-catch.  Clinically patient has urinary tract infection.   Otherwise she looks well.  She will be given a dose of Rocephin and discharged home on oral cefdinir.  Her most recent urine culture grew out Klebsiella, resistant to nitrofurantoin.  She states that she has plans to follow-up with [facility].  Encouraged to return with fevers, vomiting, worsening flank pain.  Patient verbalizes understanding and agrees with plan.   BP 122/75   Pulse 85   Temp 97.9 F (36.6 C) (Oral)   Resp (!) 25   Ht 5\' 7"  (1.702 m)   Wt 113.6 kg   SpO2 99%   BMI 39.22 kg/m     Renne Crigler, PA-C 07/25/23 2344    Rondel Baton, MD 07/26/23 1515

## 2023-07-25 NOTE — ED Triage Notes (Signed)
Pt c/o left sided flank pain and dysuria starting today. Pt has a foley. No hematuria. BP 135/88 HR 84 SPO2 96 [id number]

## 2023-07-26 DIAGNOSIS — R609 Edema, unspecified: Secondary | ICD-10-CM | POA: Diagnosis not present

## 2023-07-26 DIAGNOSIS — R404 Transient alteration of awareness: Secondary | ICD-10-CM | POA: Diagnosis not present

## 2023-07-26 DIAGNOSIS — Z23 Encounter for immunization: Secondary | ICD-10-CM | POA: Diagnosis not present

## 2023-07-26 DIAGNOSIS — J309 Allergic rhinitis, unspecified: Secondary | ICD-10-CM | POA: Diagnosis not present

## 2023-07-26 DIAGNOSIS — G809 Cerebral palsy, unspecified: Secondary | ICD-10-CM | POA: Diagnosis not present

## 2023-07-26 DIAGNOSIS — N390 Urinary tract infection, site not specified: Secondary | ICD-10-CM | POA: Diagnosis not present

## 2023-07-26 DIAGNOSIS — M810 Age-related osteoporosis without current pathological fracture: Secondary | ICD-10-CM | POA: Diagnosis not present

## 2023-07-26 DIAGNOSIS — E118 Type 2 diabetes mellitus with unspecified complications: Secondary | ICD-10-CM | POA: Diagnosis not present

## 2023-07-26 DIAGNOSIS — K219 Gastro-esophageal reflux disease without esophagitis: Secondary | ICD-10-CM | POA: Diagnosis not present

## 2023-07-26 DIAGNOSIS — G629 Polyneuropathy, unspecified: Secondary | ICD-10-CM | POA: Diagnosis not present

## 2023-07-26 DIAGNOSIS — N319 Neuromuscular dysfunction of bladder, unspecified: Secondary | ICD-10-CM | POA: Diagnosis not present

## 2023-07-26 DIAGNOSIS — R262 Difficulty in walking, not elsewhere classified: Secondary | ICD-10-CM | POA: Diagnosis not present

## 2023-07-28 LAB — URINE CULTURE: Culture: >=100000 — AB

## 2023-07-29 ENCOUNTER — Telehealth (HOSPITAL_BASED_OUTPATIENT_CLINIC_OR_DEPARTMENT_OTHER): Payer: Self-pay

## 2023-07-29 NOTE — Telephone Encounter (Signed)
Post ED Visit - Positive Culture Follow-up  Culture report reviewed by antimicrobial stewardship pharmacist: [facility] [x]  Ruben Im, Pharm.D. []  Celedonio Miyamoto, [address].D., BCPS AQ-ID []  Garvin Fila, Pharm.D., BCPS []  Georgina Pillion, Pharm.D., BCPS []  [place], [address].D., BCPS, AAHIVP []  Estella Husk, Pharm.D., BCPS, AAHIVP []  Lysle Pearl, PharmD, BCPS []  Phillips Climes, PharmD, BCPS []  Agapito Games, PharmD, BCPS []  Verlan Friends, PharmD []  Mervyn Gay, PharmD, BCPS []  Vinnie Level, PharmD  [facility] []  Len Childs, PharmD []  Greer Pickerel, PharmD []  Adalberto Cole, PharmD []  Perlie Gold, Rph []  Lonell Face) Jean Rosenthal, PharmD []  Earl Many, PharmD []  Junita Push, PharmD []  Dorna Leitz, PharmD []  Terrilee Files, PharmD []  Lynann Beaver, PharmD []  Keturah Barre, PharmD []  Loralee Pacas, PharmD []  Bernadene Person, PharmD   Positive urine culture Treated with Cefdinir, organism sensitive to the same and no further patient follow-up is required at this time.  Sandria Senter 07/29/2023, 10:32 AM

## 2023-07-30 DIAGNOSIS — N3021 Other chronic cystitis with hematuria: Secondary | ICD-10-CM | POA: Diagnosis not present

## 2023-07-30 DIAGNOSIS — R3914 Feeling of incomplete bladder emptying: Secondary | ICD-10-CM | POA: Diagnosis not present

## 2023-07-30 DIAGNOSIS — N3020 Other chronic cystitis without hematuria: Secondary | ICD-10-CM | POA: Diagnosis not present

## 2023-07-31 ENCOUNTER — Ambulatory Visit: Payer: 59 | Admitting: Obstetrics and Gynecology

## 2023-07-31 ENCOUNTER — Ambulatory Visit (INDEPENDENT_AMBULATORY_CARE_PROVIDER_SITE_OTHER): Payer: 59 | Admitting: Obstetrics and Gynecology

## 2023-07-31 ENCOUNTER — Encounter: Payer: Self-pay | Admitting: Obstetrics and Gynecology

## 2023-07-31 VITALS — BP 115/70 | HR 87

## 2023-07-31 DIAGNOSIS — N950 Postmenopausal bleeding: Secondary | ICD-10-CM | POA: Diagnosis not present

## 2023-07-31 NOTE — Patient Instructions (Addendum)
Hysteroscopy  - using a camera to look inside the uterus and then taking  sample to understand why you have been having this bleeding.

## 2023-07-31 NOTE — Progress Notes (Signed)
GYNECOLOGY VISIT  Patient name: [name] MRN [id number]  Date of birth: [birth year] Chief Complaint:   Gynecologic Exam   History:  [name] is a 67 y.o. [id number] being seen today for follow up after ultrasound. Noted that EMS is thickened on [place] and recommend sampling.    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HCC)    Anxiety    Arthritis    Bacterial infection    Benign paroxysmal positional vertigo 02/24/2013   Bipolar disorder (HCC)    Bladder infection    Cancer (HCC)    skin cancer   Cerebral palsy (HCC)    Colitis    COPD (chronic obstructive pulmonary disease) (HCC)    mild emphysema   Depression    bi polar   Diabetes mellitus, type II (HCC)    Dysplasia of cervix    GERD (gastroesophageal reflux disease)    Headache(784.0)    High cholesterol    History of chicken pox    History of measles    History of mumps    IBS (irritable bowel syndrome)    Kidney infection    Melanoma in situ (HCC)    Neck mass    Neuromuscular disorder (HCC)    Ovarian cyst    Poor circulation    Stenosis, cervical spine 05/16   Trichomonas    UTI (urinary tract infection)    current   Yeast infection     Past Surgical History:  Procedure Laterality Date   ANTERIOR CERVICAL DECOMP/DISCECTOMY FUSION N/A 05/25/2016   Procedure: Cervical five-six Anterior cervical decompression/diskectomy/fusion;  Surgeon: Coletta Memos, MD;  [facility];  Service: Neurosurgery;  Laterality: N/A;   APPENDECTOMY     CARPAL TUNNEL RELEASE     CARPAL TUNNEL RELEASE Right 08/02/2017   Procedure: CARPAL TUNNEL RELEASE RIGHT;  Surgeon: Coletta Memos, MD;  Location: [facility];  Service: Neurosurgery;  Laterality: Right;  CARPAL TUNNEL RELEASE RIGHT   CARPAL TUNNEL RELEASE Left 11/29/2017   Procedure: CARPAL TUNNEL RELEASE LEFT;  Surgeon: Coletta Memos, MD;  Location: [facility] OR;  Service: Neurosurgery;  Laterality: Left;  CARPAL TUNNEL RELEASE LEFT   COLONOSCOPY WITH PROPOFOL  N/A 11/29/2016   Procedure: COLONOSCOPY WITH PROPOFOL;  Surgeon: Rachael Fee, MD;  [facility]: [facility];  Service: Endoscopy;  Laterality: N/A;   ESOPHAGOGASTRODUODENOSCOPY (EGD) WITH PROPOFOL N/A 11/29/2016   Procedure: ESOPHAGOGASTRODUODENOSCOPY (EGD) WITH PROPOFOL;  Surgeon: Rachael Fee, MD;  [facility];  Service: Endoscopy;  Laterality: N/A;   IR INJECT/THERA/INC NEEDLE/CATH/PLC EPI/CERV/THOR [facility]  03/04/2020   JOINT REPLACEMENT     knee rotation     LEG TENDON SURGERY     OVARIAN CYST REMOVAL     REPLACEMENT TOTAL KNEE BILATERAL  2006   rotator cuff surgery      The following portions of the patient's history were reviewed and updated as appropriate: allergies, current medications, past family history, past medical history, past social history, past surgical history and problem list.   Health Maintenance:   Last pap     Component Value Date/Time   DIAGPAP  01/06/2020 1540    - Negative for intraepithelial lesion or malignancy (NILM)   HPVHIGH Negative 01/06/2020 1540   ADEQPAP  01/06/2020 1540    Satisfactory for evaluation; transformation zone component PRESENT.    High Risk HPV: Positive  Adequacy:  Satisfactory for evaluation, transformation zone component PRESENT  Diagnosis:  Atypical squamous cells of undetermined significance (ASC-US)  Last mammogram: none seen on file    Review of Systems:  Pertinent items are noted in HPI. Comprehensive review of systems was otherwise negative.   Objective:  Physical Exam BP 115/70   Pulse 87    Physical Exam Vitals and nursing note reviewed.  Constitutional:      Appearance: Normal appearance.  HENT:     Head: Normocephalic and atraumatic.  Pulmonary:     Effort: Pulmonary effort is normal.  Skin:    General: Skin is warm and dry.  Neurological:     General: No focal deficit present.     Mental Status: She is alert.  Psychiatric:        Mood and Affect: Mood normal.        Behavior: Behavior  normal.        Thought Content: Thought content normal.        Judgment: Judgment normal.        Assessment & Plan:   1. Postmenopause bleeding PMB and thickened EMS on TVUS. Recommend endometrial sampling. Offered in office sampling vs hysteroscopy. Given limited mobility and patient concern for pain with sampling, will proceed with OR sampling. Will send PV miso for preop cervical ripening.    Routine preventative health maintenance measures emphasized.  [name], MD Minimally Invasive Gynecologic Surgery Center for [facility], [facility]

## 2023-08-02 ENCOUNTER — Telehealth: Payer: Self-pay

## 2023-08-02 MED ORDER — MISOPROSTOL 200 MCG PO TABS
ORAL_TABLET | ORAL | 0 refills | Status: AC
Start: 2023-08-02 — End: ?

## 2023-08-02 NOTE — Telephone Encounter (Signed)
Called patient to schedule surgery w/ Dr. Briscoe Deutscher. Patient asked to be scheduled on 09/11/23 @[facility]  at 2pm. Patient was provided pre-op instructions over the phone, and is aware she must arrive at noon.

## 2023-08-12 DIAGNOSIS — Z7984 Long term (current) use of oral hypoglycemic drugs: Secondary | ICD-10-CM | POA: Diagnosis not present

## 2023-08-12 DIAGNOSIS — K760 Fatty (change of) liver, not elsewhere classified: Secondary | ICD-10-CM | POA: Diagnosis not present

## 2023-08-12 DIAGNOSIS — K589 Irritable bowel syndrome without diarrhea: Secondary | ICD-10-CM | POA: Diagnosis not present

## 2023-08-12 DIAGNOSIS — J439 Emphysema, unspecified: Secondary | ICD-10-CM | POA: Diagnosis not present

## 2023-08-12 DIAGNOSIS — G809 Cerebral palsy, unspecified: Secondary | ICD-10-CM | POA: Diagnosis not present

## 2023-08-12 DIAGNOSIS — Z87891 Personal history of nicotine dependence: Secondary | ICD-10-CM | POA: Diagnosis not present

## 2023-08-12 DIAGNOSIS — E782 Mixed hyperlipidemia: Secondary | ICD-10-CM | POA: Diagnosis not present

## 2023-08-12 DIAGNOSIS — M8008XD Age-related osteoporosis with current pathological fracture, vertebra(e), subsequent encounter for fracture with routine healing: Secondary | ICD-10-CM | POA: Diagnosis not present

## 2023-08-12 DIAGNOSIS — Z9181 History of falling: Secondary | ICD-10-CM | POA: Diagnosis not present

## 2023-08-12 DIAGNOSIS — R600 Localized edema: Secondary | ICD-10-CM | POA: Diagnosis not present

## 2023-08-12 DIAGNOSIS — Z466 Encounter for fitting and adjustment of urinary device: Secondary | ICD-10-CM | POA: Diagnosis not present

## 2023-08-12 DIAGNOSIS — N319 Neuromuscular dysfunction of bladder, unspecified: Secondary | ICD-10-CM | POA: Diagnosis not present

## 2023-08-12 DIAGNOSIS — J309 Allergic rhinitis, unspecified: Secondary | ICD-10-CM | POA: Diagnosis not present

## 2023-08-12 DIAGNOSIS — K219 Gastro-esophageal reflux disease without esophagitis: Secondary | ICD-10-CM | POA: Diagnosis not present

## 2023-08-12 DIAGNOSIS — E1142 Type 2 diabetes mellitus with diabetic polyneuropathy: Secondary | ICD-10-CM | POA: Diagnosis not present

## 2023-08-12 DIAGNOSIS — E220 Acromegaly and pituitary gigantism: Secondary | ICD-10-CM | POA: Diagnosis not present

## 2023-08-20 DIAGNOSIS — G809 Cerebral palsy, unspecified: Secondary | ICD-10-CM | POA: Diagnosis not present

## 2023-08-22 DIAGNOSIS — G809 Cerebral palsy, unspecified: Secondary | ICD-10-CM | POA: Diagnosis not present

## 2023-08-22 DIAGNOSIS — K219 Gastro-esophageal reflux disease without esophagitis: Secondary | ICD-10-CM | POA: Diagnosis not present

## 2023-08-22 DIAGNOSIS — Z87891 Personal history of nicotine dependence: Secondary | ICD-10-CM | POA: Diagnosis not present

## 2023-08-22 DIAGNOSIS — E220 Acromegaly and pituitary gigantism: Secondary | ICD-10-CM | POA: Diagnosis not present

## 2023-08-22 DIAGNOSIS — K760 Fatty (change of) liver, not elsewhere classified: Secondary | ICD-10-CM | POA: Diagnosis not present

## 2023-08-22 DIAGNOSIS — Z7984 Long term (current) use of oral hypoglycemic drugs: Secondary | ICD-10-CM | POA: Diagnosis not present

## 2023-08-22 DIAGNOSIS — Z7189 Other specified counseling: Secondary | ICD-10-CM | POA: Diagnosis not present

## 2023-08-22 DIAGNOSIS — Z08 Encounter for follow-up examination after completed treatment for malignant neoplasm: Secondary | ICD-10-CM | POA: Diagnosis not present

## 2023-08-22 DIAGNOSIS — M8008XD Age-related osteoporosis with current pathological fracture, vertebra(e), subsequent encounter for fracture with routine healing: Secondary | ICD-10-CM | POA: Diagnosis not present

## 2023-08-22 DIAGNOSIS — R600 Localized edema: Secondary | ICD-10-CM | POA: Diagnosis not present

## 2023-08-22 DIAGNOSIS — E1142 Type 2 diabetes mellitus with diabetic polyneuropathy: Secondary | ICD-10-CM | POA: Diagnosis not present

## 2023-08-22 DIAGNOSIS — L304 Erythema intertrigo: Secondary | ICD-10-CM | POA: Diagnosis not present

## 2023-08-22 DIAGNOSIS — L821 Other seborrheic keratosis: Secondary | ICD-10-CM | POA: Diagnosis not present

## 2023-08-22 DIAGNOSIS — Z466 Encounter for fitting and adjustment of urinary device: Secondary | ICD-10-CM | POA: Diagnosis not present

## 2023-08-22 DIAGNOSIS — L814 Other melanin hyperpigmentation: Secondary | ICD-10-CM | POA: Diagnosis not present

## 2023-08-22 DIAGNOSIS — L738 Other specified follicular disorders: Secondary | ICD-10-CM | POA: Diagnosis not present

## 2023-08-22 DIAGNOSIS — Z9181 History of falling: Secondary | ICD-10-CM | POA: Diagnosis not present

## 2023-08-22 DIAGNOSIS — Z85828 Personal history of other malignant neoplasm of skin: Secondary | ICD-10-CM | POA: Diagnosis not present

## 2023-08-22 DIAGNOSIS — J439 Emphysema, unspecified: Secondary | ICD-10-CM | POA: Diagnosis not present

## 2023-08-22 DIAGNOSIS — K589 Irritable bowel syndrome without diarrhea: Secondary | ICD-10-CM | POA: Diagnosis not present

## 2023-08-22 DIAGNOSIS — E782 Mixed hyperlipidemia: Secondary | ICD-10-CM | POA: Diagnosis not present

## 2023-08-22 DIAGNOSIS — D225 Melanocytic nevi of trunk: Secondary | ICD-10-CM | POA: Diagnosis not present

## 2023-08-22 DIAGNOSIS — N319 Neuromuscular dysfunction of bladder, unspecified: Secondary | ICD-10-CM | POA: Diagnosis not present

## 2023-08-22 DIAGNOSIS — J309 Allergic rhinitis, unspecified: Secondary | ICD-10-CM | POA: Diagnosis not present

## 2023-08-23 DIAGNOSIS — J439 Emphysema, unspecified: Secondary | ICD-10-CM | POA: Diagnosis not present

## 2023-08-23 DIAGNOSIS — M8008XD Age-related osteoporosis with current pathological fracture, vertebra(e), subsequent encounter for fracture with routine healing: Secondary | ICD-10-CM | POA: Diagnosis not present

## 2023-08-23 DIAGNOSIS — Z7984 Long term (current) use of oral hypoglycemic drugs: Secondary | ICD-10-CM | POA: Diagnosis not present

## 2023-08-23 DIAGNOSIS — Z87891 Personal history of nicotine dependence: Secondary | ICD-10-CM | POA: Diagnosis not present

## 2023-08-23 DIAGNOSIS — E1142 Type 2 diabetes mellitus with diabetic polyneuropathy: Secondary | ICD-10-CM | POA: Diagnosis not present

## 2023-08-23 DIAGNOSIS — J309 Allergic rhinitis, unspecified: Secondary | ICD-10-CM | POA: Diagnosis not present

## 2023-08-23 DIAGNOSIS — E220 Acromegaly and pituitary gigantism: Secondary | ICD-10-CM | POA: Diagnosis not present

## 2023-08-23 DIAGNOSIS — K760 Fatty (change of) liver, not elsewhere classified: Secondary | ICD-10-CM | POA: Diagnosis not present

## 2023-08-23 DIAGNOSIS — G809 Cerebral palsy, unspecified: Secondary | ICD-10-CM | POA: Diagnosis not present

## 2023-08-23 DIAGNOSIS — K219 Gastro-esophageal reflux disease without esophagitis: Secondary | ICD-10-CM | POA: Diagnosis not present

## 2023-08-23 DIAGNOSIS — N319 Neuromuscular dysfunction of bladder, unspecified: Secondary | ICD-10-CM | POA: Diagnosis not present

## 2023-08-23 DIAGNOSIS — K589 Irritable bowel syndrome without diarrhea: Secondary | ICD-10-CM | POA: Diagnosis not present

## 2023-08-23 DIAGNOSIS — Z9181 History of falling: Secondary | ICD-10-CM | POA: Diagnosis not present

## 2023-08-23 DIAGNOSIS — E782 Mixed hyperlipidemia: Secondary | ICD-10-CM | POA: Diagnosis not present

## 2023-08-23 DIAGNOSIS — R600 Localized edema: Secondary | ICD-10-CM | POA: Diagnosis not present

## 2023-08-23 DIAGNOSIS — Z466 Encounter for fitting and adjustment of urinary device: Secondary | ICD-10-CM | POA: Diagnosis not present

## 2023-08-26 ENCOUNTER — Encounter: Payer: Self-pay | Admitting: Obstetrics and Gynecology

## 2023-08-26 ENCOUNTER — Telehealth: Payer: Self-pay

## 2023-08-26 NOTE — Telephone Encounter (Addendum)
VM left on nurse line stating she is experiencing bleeding. States she is postmenopausal and has tumors in her uterus. Chart review shows she is an established patient of Ajewole, MD with planned hysteroscopy/D&C on 09/11/23. Needs call back.

## 2023-08-27 ENCOUNTER — Telehealth: Payer: Self-pay | Admitting: Lactation Services

## 2023-08-27 ENCOUNTER — Other Ambulatory Visit: Payer: Self-pay | Admitting: Obstetrics and Gynecology

## 2023-08-27 DIAGNOSIS — N950 Postmenopausal bleeding: Secondary | ICD-10-CM

## 2023-08-27 DIAGNOSIS — N939 Abnormal uterine and vaginal bleeding, unspecified: Secondary | ICD-10-CM

## 2023-08-27 MED ORDER — MEGESTROL ACETATE 40 MG PO TABS
40.0000 mg | ORAL_TABLET | Freq: Two times a day (BID) | ORAL | 0 refills | Status: DC
Start: 2023-08-27 — End: 2023-09-11

## 2023-08-27 NOTE — Telephone Encounter (Signed)
Called patient to discuss that Dr. Briscoe Deutscher is sending in Megace to take if another episode. Reviewed medication administration in event of heavy bleeding. Reviewed to continue to monitor symptoms and to let the office know if bleeding continues with Megace. Patient voiced understanding.   She is concerned with bleeding, reviewed this is why further testing is scheduled to try to determine why she is bleeding. Patient voiced understanding.

## 2023-08-27 NOTE — Telephone Encounter (Signed)
Message sent to Dr. Briscoe Deutscher for advisement on patient advise encounter.

## 2023-08-28 DIAGNOSIS — K589 Irritable bowel syndrome without diarrhea: Secondary | ICD-10-CM | POA: Diagnosis not present

## 2023-08-28 DIAGNOSIS — G809 Cerebral palsy, unspecified: Secondary | ICD-10-CM | POA: Diagnosis not present

## 2023-08-28 DIAGNOSIS — M8008XD Age-related osteoporosis with current pathological fracture, vertebra(e), subsequent encounter for fracture with routine healing: Secondary | ICD-10-CM | POA: Diagnosis not present

## 2023-08-28 DIAGNOSIS — R600 Localized edema: Secondary | ICD-10-CM | POA: Diagnosis not present

## 2023-08-28 DIAGNOSIS — K219 Gastro-esophageal reflux disease without esophagitis: Secondary | ICD-10-CM | POA: Diagnosis not present

## 2023-08-28 DIAGNOSIS — E782 Mixed hyperlipidemia: Secondary | ICD-10-CM | POA: Diagnosis not present

## 2023-08-28 DIAGNOSIS — Z466 Encounter for fitting and adjustment of urinary device: Secondary | ICD-10-CM | POA: Diagnosis not present

## 2023-08-28 DIAGNOSIS — E1142 Type 2 diabetes mellitus with diabetic polyneuropathy: Secondary | ICD-10-CM | POA: Diagnosis not present

## 2023-08-28 DIAGNOSIS — K760 Fatty (change of) liver, not elsewhere classified: Secondary | ICD-10-CM | POA: Diagnosis not present

## 2023-08-28 DIAGNOSIS — Z7984 Long term (current) use of oral hypoglycemic drugs: Secondary | ICD-10-CM | POA: Diagnosis not present

## 2023-08-28 DIAGNOSIS — E220 Acromegaly and pituitary gigantism: Secondary | ICD-10-CM | POA: Diagnosis not present

## 2023-08-28 DIAGNOSIS — J439 Emphysema, unspecified: Secondary | ICD-10-CM | POA: Diagnosis not present

## 2023-08-28 DIAGNOSIS — Z87891 Personal history of nicotine dependence: Secondary | ICD-10-CM | POA: Diagnosis not present

## 2023-08-28 DIAGNOSIS — Z9181 History of falling: Secondary | ICD-10-CM | POA: Diagnosis not present

## 2023-08-28 DIAGNOSIS — J309 Allergic rhinitis, unspecified: Secondary | ICD-10-CM | POA: Diagnosis not present

## 2023-08-28 DIAGNOSIS — N319 Neuromuscular dysfunction of bladder, unspecified: Secondary | ICD-10-CM | POA: Diagnosis not present

## 2023-08-30 DIAGNOSIS — R3914 Feeling of incomplete bladder emptying: Secondary | ICD-10-CM | POA: Diagnosis not present

## 2023-08-30 DIAGNOSIS — N3021 Other chronic cystitis with hematuria: Secondary | ICD-10-CM | POA: Diagnosis not present

## 2023-09-04 ENCOUNTER — Other Ambulatory Visit: Payer: Self-pay

## 2023-09-04 ENCOUNTER — Encounter (HOSPITAL_BASED_OUTPATIENT_CLINIC_OR_DEPARTMENT_OTHER): Payer: Self-pay | Admitting: Obstetrics and Gynecology

## 2023-09-04 DIAGNOSIS — K760 Fatty (change of) liver, not elsewhere classified: Secondary | ICD-10-CM | POA: Diagnosis not present

## 2023-09-04 DIAGNOSIS — G809 Cerebral palsy, unspecified: Secondary | ICD-10-CM | POA: Diagnosis not present

## 2023-09-04 DIAGNOSIS — E782 Mixed hyperlipidemia: Secondary | ICD-10-CM | POA: Diagnosis not present

## 2023-09-04 DIAGNOSIS — E220 Acromegaly and pituitary gigantism: Secondary | ICD-10-CM | POA: Diagnosis not present

## 2023-09-04 DIAGNOSIS — Z9181 History of falling: Secondary | ICD-10-CM | POA: Diagnosis not present

## 2023-09-04 DIAGNOSIS — Z7984 Long term (current) use of oral hypoglycemic drugs: Secondary | ICD-10-CM | POA: Diagnosis not present

## 2023-09-04 DIAGNOSIS — E1142 Type 2 diabetes mellitus with diabetic polyneuropathy: Secondary | ICD-10-CM | POA: Diagnosis not present

## 2023-09-04 DIAGNOSIS — Z87891 Personal history of nicotine dependence: Secondary | ICD-10-CM | POA: Diagnosis not present

## 2023-09-04 DIAGNOSIS — J439 Emphysema, unspecified: Secondary | ICD-10-CM | POA: Diagnosis not present

## 2023-09-04 DIAGNOSIS — Z466 Encounter for fitting and adjustment of urinary device: Secondary | ICD-10-CM | POA: Diagnosis not present

## 2023-09-04 DIAGNOSIS — R600 Localized edema: Secondary | ICD-10-CM | POA: Diagnosis not present

## 2023-09-04 DIAGNOSIS — N319 Neuromuscular dysfunction of bladder, unspecified: Secondary | ICD-10-CM | POA: Diagnosis not present

## 2023-09-04 DIAGNOSIS — K589 Irritable bowel syndrome without diarrhea: Secondary | ICD-10-CM | POA: Diagnosis not present

## 2023-09-04 DIAGNOSIS — K219 Gastro-esophageal reflux disease without esophagitis: Secondary | ICD-10-CM | POA: Diagnosis not present

## 2023-09-04 DIAGNOSIS — M8008XD Age-related osteoporosis with current pathological fracture, vertebra(e), subsequent encounter for fracture with routine healing: Secondary | ICD-10-CM | POA: Diagnosis not present

## 2023-09-04 DIAGNOSIS — J309 Allergic rhinitis, unspecified: Secondary | ICD-10-CM | POA: Diagnosis not present

## 2023-09-04 NOTE — Progress Notes (Signed)
Secure chat send to dejuana bigelow for dr Briscoe Deutscher, patient is 2 person transfer and cannot be done at [facility]

## 2023-09-05 DIAGNOSIS — N319 Neuromuscular dysfunction of bladder, unspecified: Secondary | ICD-10-CM | POA: Diagnosis not present

## 2023-09-05 DIAGNOSIS — J439 Emphysema, unspecified: Secondary | ICD-10-CM | POA: Diagnosis not present

## 2023-09-05 DIAGNOSIS — Z466 Encounter for fitting and adjustment of urinary device: Secondary | ICD-10-CM | POA: Diagnosis not present

## 2023-09-05 DIAGNOSIS — K219 Gastro-esophageal reflux disease without esophagitis: Secondary | ICD-10-CM | POA: Diagnosis not present

## 2023-09-05 DIAGNOSIS — Z7984 Long term (current) use of oral hypoglycemic drugs: Secondary | ICD-10-CM | POA: Diagnosis not present

## 2023-09-05 DIAGNOSIS — M8008XD Age-related osteoporosis with current pathological fracture, vertebra(e), subsequent encounter for fracture with routine healing: Secondary | ICD-10-CM | POA: Diagnosis not present

## 2023-09-05 DIAGNOSIS — Z87891 Personal history of nicotine dependence: Secondary | ICD-10-CM | POA: Diagnosis not present

## 2023-09-05 DIAGNOSIS — E220 Acromegaly and pituitary gigantism: Secondary | ICD-10-CM | POA: Diagnosis not present

## 2023-09-05 DIAGNOSIS — K589 Irritable bowel syndrome without diarrhea: Secondary | ICD-10-CM | POA: Diagnosis not present

## 2023-09-05 DIAGNOSIS — E782 Mixed hyperlipidemia: Secondary | ICD-10-CM | POA: Diagnosis not present

## 2023-09-05 DIAGNOSIS — E1142 Type 2 diabetes mellitus with diabetic polyneuropathy: Secondary | ICD-10-CM | POA: Diagnosis not present

## 2023-09-05 DIAGNOSIS — R600 Localized edema: Secondary | ICD-10-CM | POA: Diagnosis not present

## 2023-09-05 DIAGNOSIS — Z9181 History of falling: Secondary | ICD-10-CM | POA: Diagnosis not present

## 2023-09-05 DIAGNOSIS — K760 Fatty (change of) liver, not elsewhere classified: Secondary | ICD-10-CM | POA: Diagnosis not present

## 2023-09-05 DIAGNOSIS — G809 Cerebral palsy, unspecified: Secondary | ICD-10-CM | POA: Diagnosis not present

## 2023-09-05 DIAGNOSIS — J309 Allergic rhinitis, unspecified: Secondary | ICD-10-CM | POA: Diagnosis not present

## 2023-09-09 DIAGNOSIS — R600 Localized edema: Secondary | ICD-10-CM | POA: Diagnosis not present

## 2023-09-09 DIAGNOSIS — E220 Acromegaly and pituitary gigantism: Secondary | ICD-10-CM | POA: Diagnosis not present

## 2023-09-09 DIAGNOSIS — Z9181 History of falling: Secondary | ICD-10-CM | POA: Diagnosis not present

## 2023-09-09 DIAGNOSIS — Z466 Encounter for fitting and adjustment of urinary device: Secondary | ICD-10-CM | POA: Diagnosis not present

## 2023-09-09 DIAGNOSIS — K760 Fatty (change of) liver, not elsewhere classified: Secondary | ICD-10-CM | POA: Diagnosis not present

## 2023-09-09 DIAGNOSIS — Z7984 Long term (current) use of oral hypoglycemic drugs: Secondary | ICD-10-CM | POA: Diagnosis not present

## 2023-09-09 DIAGNOSIS — M8008XD Age-related osteoporosis with current pathological fracture, vertebra(e), subsequent encounter for fracture with routine healing: Secondary | ICD-10-CM | POA: Diagnosis not present

## 2023-09-09 DIAGNOSIS — J439 Emphysema, unspecified: Secondary | ICD-10-CM | POA: Diagnosis not present

## 2023-09-09 DIAGNOSIS — E782 Mixed hyperlipidemia: Secondary | ICD-10-CM | POA: Diagnosis not present

## 2023-09-09 DIAGNOSIS — G809 Cerebral palsy, unspecified: Secondary | ICD-10-CM | POA: Diagnosis not present

## 2023-09-09 DIAGNOSIS — Z87891 Personal history of nicotine dependence: Secondary | ICD-10-CM | POA: Diagnosis not present

## 2023-09-09 DIAGNOSIS — N319 Neuromuscular dysfunction of bladder, unspecified: Secondary | ICD-10-CM | POA: Diagnosis not present

## 2023-09-09 DIAGNOSIS — K589 Irritable bowel syndrome without diarrhea: Secondary | ICD-10-CM | POA: Diagnosis not present

## 2023-09-09 DIAGNOSIS — K219 Gastro-esophageal reflux disease without esophagitis: Secondary | ICD-10-CM | POA: Diagnosis not present

## 2023-09-09 DIAGNOSIS — J309 Allergic rhinitis, unspecified: Secondary | ICD-10-CM | POA: Diagnosis not present

## 2023-09-09 DIAGNOSIS — E1142 Type 2 diabetes mellitus with diabetic polyneuropathy: Secondary | ICD-10-CM | POA: Diagnosis not present

## 2023-09-10 ENCOUNTER — Encounter (HOSPITAL_COMMUNITY): Payer: Self-pay | Admitting: Obstetrics and Gynecology

## 2023-09-10 ENCOUNTER — Other Ambulatory Visit: Payer: Self-pay

## 2023-09-10 ENCOUNTER — Other Ambulatory Visit (HOSPITAL_COMMUNITY): Payer: Self-pay | Admitting: Obstetrics and Gynecology

## 2023-09-10 ENCOUNTER — Telehealth: Payer: Self-pay

## 2023-09-10 DIAGNOSIS — N950 Postmenopausal bleeding: Secondary | ICD-10-CM

## 2023-09-10 NOTE — Progress Notes (Signed)
Anesthesia Chart Review: [name]  Case: [id number] Date/Time: 09/11/23 1245   Procedures:      DILATATION AND CURETTAGE /HYSTEROSCOPY     OPERATIVE ULTRASOUND   Anesthesia type: Choice   Pre-op diagnosis: postmenopausal bleeding   [facility]   Surgeons: [name], MD       DISCUSSION: Patient is a 67 year old female scheduled for the above procedure.  She was moved from [facility] to [facility] OR due to needing two-person transfer  History includes former smoker, cerebral palsy, hypercholesterolemia, IBS, GERD, DM2, bipolar disorder, skin cancer, peripheral neuropathy, acromegaly, melanoma in situ, seizure-like activity, osteoarthritis (right TKA 03/12/06, left TKA 05/10/06), spinal surgery (C5-6 ACDF 05/25/16).  Neurology visit with Quinn Plowman, PA-C on 07/02/23 for OSA, cervical spinal stenosis and seizure like spells for the past couple of years. Recent sleep study showed mild OSA.  Hospitalization at [facility] on the Epilepsy Monitoring Unit in August 2024. Per Discharge Summary: "Baseline EEG was performed followed by continuous video EEG during the entirety of this admission.   We were able to capture the patient's stereotypic spells consisting of dozing off (eyes closed and head to the side, aroused when called) during this admission and EEG at the time of these events did not reveal underlying electrographic seizures. EEG was normal awake and asleep. For a full report of the clinical and electroencephalographic data please see the associated EEG dictation. Based on this assessment long term AED therapy was not recommend for treatment of these spells." Due to excessive daytime sleepiness tripling was reduced to 50 mg nightly, gabapentin reduced to 100 mg twice daily and lamotrigine was reduced to 150 mg daily.   Anesthesia team to evaluate on the day of surgery.  Of note 02/13/2023 MRI of the C-spine showed moderate to severe spinal cord stenosis at C4-C5 and  moderate stenosis at C3-4 neurology has referred her to neurosurgery.   VS: On 07/02/23 ([facility]): BP 126/78, HR 91   PROVIDERS: Lonie Peak, PA-C is PCP  Quinn Plowman, PA-C is neurology provider. Last visit 07/02/23.    LABS: For day of surgery as indicated. On 07/25/23 glucose 78, Cr 0.84 , WBC 11.8, H/H 14.7/45.7, PLT 381.   OTHER: Sleep Study 06/14/23 ([facility]): IMPRESSIONS  - Mild obstructive sleep apnea occurred during this study  AHI = 10.7-h .  - Moderate oxygen desaturation was noted during this study  Min O2 = 76% .  - The patient snored with moderate snoring volume.  - No cardiac abnormalities were noted during this study.  - Severe periodic limb movements of sleep occurred during the study.  PLMI = 133.0-h  - Prolonged sleep latency, along with few middle of the night brief awakening resulted in decreased sleep efficiency   EEG 07/20/21: Impression: This awake and drowsy EEG is normal.   Clinical Correlation: A normal EEG does not exclude a clinical diagnosis of epilepsy.  If further clinical questions remain, prolonged EEG may be helpful.  Clinical correlation is advised.   IMAGES: MRI Brain 02/13/23 ([facility]): Evaluation is somewhat limited by motion artifact. Within this  limitation, no seizure etiology is seen. No acute intracranial  process.    EKG: EKG 05/31/23 ([facility]): Per Narrative in [facility]: Sinus rhythm  Low voltage QRS, consider pulmonary disease or obesity  Inferior infarct  cited on or before 18-Aug-2020  Cannot rule out Anterior infarct  cited on or before 18-Aug-2020   When compared with ECG of 18-Aug-2020  12 46,  Questionable change in initial forces of lateral leads  Confirmed by Vladimir Creeks  [phone]  on 05-31-2023 4 20 25  PM    CV: Remote history of a negative pharmacologic stress Myoview study in 2006.    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HCC)    Anxiety    Arthritis    Benign paroxysmal positional vertigo 02/24/2013    Bipolar disorder (HCC)    Cancer (HCC)    skin cancer   Cerebral palsy (HCC)    Colitis    Depression    bi polar   Diabetes mellitus, type II (HCC)    Dysplasia of cervix    GERD (gastroesophageal reflux disease)    Headache(784.0)    High cholesterol    History of chicken pox    History of measles    History of mumps    IBS (irritable bowel syndrome)    Low potassium syndrome    Melanoma in situ (HCC)    Neuromuscular disorder (HCC)    cerebral palsy   Ovarian cyst    Peripheral neuropathy    Stenosis, cervical spine 02/2015   Trichomonas     Past Surgical History:  Procedure Laterality Date   ANTERIOR CERVICAL DECOMP/DISCECTOMY FUSION N/A 05/25/2016   Procedure: Cervical five-six Anterior cervical decompression/diskectomy/fusion;  Surgeon: Coletta Memos, MD;  [facility];  Service: Neurosurgery;  Laterality: N/A;   APPENDECTOMY     CARPAL TUNNEL RELEASE     CARPAL TUNNEL RELEASE Right 08/02/2017   Procedure: CARPAL TUNNEL RELEASE RIGHT;  Surgeon: Coletta Memos, MD;  [facility];  Service: Neurosurgery;  Laterality: Right;  CARPAL TUNNEL RELEASE RIGHT   CARPAL TUNNEL RELEASE Left 11/29/2017   Procedure: CARPAL TUNNEL RELEASE LEFT;  Surgeon: Coletta Memos, MD;  [facility];  Service: Neurosurgery;  Laterality: Left;  CARPAL TUNNEL RELEASE LEFT   COLONOSCOPY WITH PROPOFOL N/A 11/29/2016   Procedure: COLONOSCOPY WITH PROPOFOL;  Surgeon: Rachael Fee, MD;  [facility];  Service: Endoscopy;  Laterality: N/A;   ESOPHAGOGASTRODUODENOSCOPY (EGD) WITH PROPOFOL N/A 11/29/2016   Procedure: ESOPHAGOGASTRODUODENOSCOPY (EGD) WITH PROPOFOL;  Surgeon: Rachael Fee, MD;  [facility];  Service: Endoscopy;  Laterality: N/A;   IR INJECT/THERA/INC NEEDLE/CATH/PLC EPI/CERV/THOR [facility]  03/04/2020   LEG TENDON SURGERY     NASAL FRACTURE SURGERY     OVARIAN CYST REMOVAL     REPLACEMENT TOTAL KNEE BILATERAL  10/29/2004   rotator cuff surgery       MEDICATIONS: No current facility-administered medications for this encounter.    albuterol (PROVENTIL HFA;VENTOLIN HFA) 108 (90 Base) MCG/ACT inhaler   amitriptyline (ELAVIL) 50 MG tablet   amitriptyline (ELAVIL) 50 MG tablet   AQUALANCE LANCETS 30G MISC   Ascorbic Acid (VITAMIN C) 500 MG CAPS   atorvastatin (LIPITOR) 40 MG tablet   B Complex Vitamins (B COMPLEX 1 PO)   Blood Glucose Monitoring Suppl (ONE TOUCH ULTRA 2) w/Device KIT   busPIRone (BUSPAR) 10 MG tablet   fluticasone (FLONASE) 50 MCG/ACT nasal spray   gabapentin (NEURONTIN) 600 MG tablet   glipiZIDE (GLUCOTROL XL) 10 MG 24 hr tablet   lamoTRIgine (LAMICTAL) 200 MG tablet   Lancet Devices (ADJUSTABLE LANCING DEVICE) MISC   magnesium 30 MG tablet   megestrol (MEGACE) 40 MG tablet   metFORMIN (GLUCOPHAGE-XR) 500 MG 24 hr tablet   misoprostol (CYTOTEC) 200 MCG tablet   Multiple Vitamins-Minerals (WOMENS 50+ MULTI VITAMIN PO)   Niacin-Polypodium Leucotomos (HELIOCARE  ADVANCED) 250-120 MG CAPS   ONE TOUCH ULTRA TEST test strip   orlistat (XENICAL) 120 MG capsule   pilocarpine (SALAGEN) 5 MG tablet   Polyethyl Glycol-Propyl Glycol 0.4-0.3 % SOLN   potassium chloride (KLOR-CON) 10 MEQ tablet   Propylene Glycol-Glycerin 1-0.3 % SOLN   risedronate (ACTONEL) 150 MG tablet   spironolactone (ALDACTONE) 100 MG tablet    Shonna Chock, PA-C Surgical Short Stay/Anesthesiology [facility] Phone [phone] [facility] Phone [phone] 09/10/2023 5:56 PM

## 2023-09-10 NOTE — Telephone Encounter (Signed)
Reached out to patient to advise the location of her surgery has changed for tomorrow procedure(09/11/23). Left voicemail advising the surgery begins at 1 pm and she has to arrive at [facility] by 11 am. Advised I am sending new surgery details to patients Mychart.

## 2023-09-10 NOTE — Progress Notes (Signed)
SDW CALL  Patient was given pre-op instructions over the phone. The opportunity was given for the patient to ask questions. No further questions asked. Patient verbalized understanding of instructions given.   PCP -  Cardiologist - denies Neurologist - Dr. Emelda Fear  PPM/ICD - denies   Chest x-ray - denies EKG - DOS Stress Test - denies ECHO - denies Cardiac Cath - denies  Sleep Study - mild sleep apnea  Fasting Blood Sugar - does not check blood sugar at home   Blood Thinner Instructions:n/a Aspirin Instructions: n/a  ERAS Protcol - NPO  COVID TEST- n/a   Anesthesia review: yes  Patient denies shortness of breath, fever, cough and chest pain over the phone call   All instructions explained to the patient, with a verbal understanding of the material. Patient agrees to go over the instructions while at home for a better understanding.

## 2023-09-11 ENCOUNTER — Ambulatory Visit (HOSPITAL_COMMUNITY)
Admission: RE | Admit: 2023-09-11 | Discharge: 2023-09-11 | Disposition: A | Discharge status: Home / Self Care | Payer: 59 | Attending: Obstetrics and Gynecology | Admitting: Obstetrics and Gynecology

## 2023-09-11 ENCOUNTER — Ambulatory Visit (HOSPITAL_COMMUNITY)
Admission: RE | Admit: 2023-09-11 | Discharge: 2023-09-11 | Discharge status: Home / Self Care | Payer: 59 | Source: Ambulatory Visit | Attending: Obstetrics and Gynecology

## 2023-09-11 ENCOUNTER — Other Ambulatory Visit: Payer: Self-pay

## 2023-09-11 ENCOUNTER — Encounter (HOSPITAL_COMMUNITY)
Admission: RE | Disposition: A | Discharge status: Home / Self Care | Payer: Self-pay | Source: Home / Self Care | Attending: Obstetrics and Gynecology

## 2023-09-11 ENCOUNTER — Encounter (HOSPITAL_COMMUNITY): Payer: Self-pay | Admitting: Obstetrics and Gynecology

## 2023-09-11 ENCOUNTER — Ambulatory Visit (HOSPITAL_COMMUNITY): Payer: 59 | Admitting: Vascular Surgery

## 2023-09-11 ENCOUNTER — Ambulatory Visit (HOSPITAL_BASED_OUTPATIENT_CLINIC_OR_DEPARTMENT_OTHER): Payer: 59 | Admitting: Vascular Surgery

## 2023-09-11 ENCOUNTER — Other Ambulatory Visit (HOSPITAL_COMMUNITY): Payer: Self-pay | Admitting: Psychiatry

## 2023-09-11 DIAGNOSIS — N950 Postmenopausal bleeding: Secondary | ICD-10-CM | POA: Insufficient documentation

## 2023-09-11 DIAGNOSIS — Z7984 Long term (current) use of oral hypoglycemic drugs: Secondary | ICD-10-CM | POA: Insufficient documentation

## 2023-09-11 DIAGNOSIS — R413 Other amnesia: Secondary | ICD-10-CM

## 2023-09-11 DIAGNOSIS — Z87891 Personal history of nicotine dependence: Secondary | ICD-10-CM | POA: Diagnosis not present

## 2023-09-11 DIAGNOSIS — E119 Type 2 diabetes mellitus without complications: Secondary | ICD-10-CM | POA: Insufficient documentation

## 2023-09-11 DIAGNOSIS — N840 Polyp of corpus uteri: Secondary | ICD-10-CM | POA: Diagnosis not present

## 2023-09-11 DIAGNOSIS — Z79899 Other long term (current) drug therapy: Secondary | ICD-10-CM | POA: Insufficient documentation

## 2023-09-11 DIAGNOSIS — R519 Headache, unspecified: Secondary | ICD-10-CM | POA: Insufficient documentation

## 2023-09-11 DIAGNOSIS — R569 Unspecified convulsions: Secondary | ICD-10-CM | POA: Insufficient documentation

## 2023-09-11 DIAGNOSIS — G809 Cerebral palsy, unspecified: Secondary | ICD-10-CM | POA: Insufficient documentation

## 2023-09-11 DIAGNOSIS — J449 Chronic obstructive pulmonary disease, unspecified: Secondary | ICD-10-CM | POA: Insufficient documentation

## 2023-09-11 DIAGNOSIS — K219 Gastro-esophageal reflux disease without esophagitis: Secondary | ICD-10-CM | POA: Diagnosis not present

## 2023-09-11 DIAGNOSIS — F319 Bipolar disorder, unspecified: Secondary | ICD-10-CM | POA: Insufficient documentation

## 2023-09-11 DIAGNOSIS — N939 Abnormal uterine and vaginal bleeding, unspecified: Secondary | ICD-10-CM

## 2023-09-11 DIAGNOSIS — M1990 Unspecified osteoarthritis, unspecified site: Secondary | ICD-10-CM | POA: Diagnosis not present

## 2023-09-11 DIAGNOSIS — F419 Anxiety disorder, unspecified: Secondary | ICD-10-CM

## 2023-09-11 HISTORY — DX: Polyneuropathy, unspecified: G62.9

## 2023-09-11 HISTORY — DX: Unspecified convulsions: R56.9

## 2023-09-11 HISTORY — PX: OPERATIVE ULTRASOUND: SHX5996

## 2023-09-11 HISTORY — PX: DILATATION & CURETTAGE/HYSTEROSCOPY WITH MYOSURE: SHX6511

## 2023-09-11 HISTORY — DX: Hypokalemia: E87.6

## 2023-09-11 LAB — CBC
HCT: 46.8 % — ABNORMAL HIGH (ref 36.0–46.0)
Hemoglobin: 15.1 g/dL — ABNORMAL HIGH (ref 12.0–15.0)
MCH: 28.4 pg (ref 26.0–34.0)
MCHC: 32.3 g/dL (ref 30.0–36.0)
MCV: 88.1 fL (ref 80.0–100.0)
Platelets: 416 10*3/uL — ABNORMAL HIGH (ref 150–400)
RBC: 5.31 MIL/uL — ABNORMAL HIGH (ref 3.87–5.11)
RDW: 13.5 % (ref 11.5–15.5)
WBC: 15.9 10*3/uL — ABNORMAL HIGH (ref 4.0–10.5)
nRBC: 0 % (ref 0.0–0.2)

## 2023-09-11 LAB — BASIC METABOLIC PANEL
Anion gap: 13 (ref 5–15)
BUN: 14 mg/dL (ref 8–23)
CO2: 18 mmol/L — ABNORMAL LOW (ref 22–32)
Calcium: 9.2 mg/dL (ref 8.9–10.3)
Chloride: 106 mmol/L (ref 98–111)
Creatinine, Ser: 0.68 mg/dL (ref 0.44–1.00)
GFR, Estimated: >60 mL/min (ref >60)
Glucose, Bld: 85 mg/dL (ref 70–99)
Potassium: 3.8 mmol/L (ref 3.5–5.1)
Sodium: 137 mmol/L (ref 135–145)

## 2023-09-11 LAB — GLUCOSE, CAPILLARY
Glucose-Capillary: 80 mg/dL (ref 70–99)
Glucose-Capillary: 92 mg/dL (ref 70–99)
Glucose-Capillary: 38 mg/dL — CL (ref 70–99)
Glucose-Capillary: 43 mg/dL — CL (ref 70–99)
Glucose-Capillary: 43 mg/dL — CL (ref 70–99)
Glucose-Capillary: 92 mg/dL (ref 70–99)

## 2023-09-11 SURGERY — DILATATION & CURETTAGE/HYSTEROSCOPY WITH MYOSURE
Anesthesia: General | Site: Uterus

## 2023-09-11 MED ORDER — ORAL CARE MOUTH RINSE: 15.0000 mL | Freq: Once | OROMUCOSAL | Status: AC

## 2023-09-11 MED ORDER — GLYCOPYRROLATE 0.2 MG/ML IJ SOLN
INTRAMUSCULAR | Status: DC | PRN
  Administered 2023-09-11: .1 mg via INTRAVENOUS

## 2023-09-11 MED ORDER — LIDOCAINE 2% (20 MG/ML) 5 ML SYRINGE
INTRAMUSCULAR | Status: AC
  Filled 2023-09-11: qty 5

## 2023-09-11 MED ORDER — KETOROLAC TROMETHAMINE 30 MG/ML IJ SOLN
INTRAMUSCULAR | Status: AC
  Filled 2023-09-11: qty 1

## 2023-09-11 MED ORDER — DEXAMETHASONE SODIUM PHOSPHATE 10 MG/ML IJ SOLN
INTRAMUSCULAR | Status: AC
  Filled 2023-09-11: qty 1

## 2023-09-11 MED ORDER — KETOROLAC TROMETHAMINE 15 MG/ML IJ SOLN
INTRAMUSCULAR | Status: DC | PRN
  Administered 2023-09-11: 15 mg via INTRAVENOUS

## 2023-09-11 MED ORDER — LIDOCAINE 2% (20 MG/ML) 5 ML SYRINGE
INTRAMUSCULAR | Status: DC | PRN
  Administered 2023-09-11: 100 mg via INTRAVENOUS

## 2023-09-11 MED ORDER — PHENYLEPHRINE 80 MCG/ML (10ML) SYRINGE FOR IV PUSH (FOR BLOOD PRESSURE SUPPORT)
PREFILLED_SYRINGE | INTRAVENOUS | Status: DC | PRN
  Administered 2023-09-11: 80 ug via INTRAVENOUS

## 2023-09-11 MED ORDER — FENTANYL CITRATE (PF) 250 MCG/5ML IJ SOLN
INTRAMUSCULAR | Status: DC | PRN
  Administered 2023-09-11: 100 ug via INTRAVENOUS

## 2023-09-11 MED ORDER — FENTANYL CITRATE (PF) 250 MCG/5ML IJ SOLN
INTRAMUSCULAR | Status: AC
  Filled 2023-09-11: qty 5

## 2023-09-11 MED ORDER — LACTATED RINGERS IV SOLN: INTRAVENOUS | Status: DC

## 2023-09-11 MED ORDER — DEXAMETHASONE SODIUM PHOSPHATE 10 MG/ML IJ SOLN
INTRAMUSCULAR | Status: DC | PRN
  Administered 2023-09-11: 5 mg via INTRAVENOUS

## 2023-09-11 MED ORDER — BUPIVACAINE HCL (PF) 0.5 % IJ SOLN
INTRAMUSCULAR | Status: DC | PRN
  Administered 2023-09-11: 10 mL

## 2023-09-11 MED ORDER — DEXTROSE 50 % IV SOLN: 25.0000 mL | Freq: Once | INTRAVENOUS | Status: AC

## 2023-09-11 MED ORDER — ACETAMINOPHEN 500 MG PO TABS
1000.0000 mg | ORAL_TABLET | ORAL | Status: AC
  Administered 2023-09-11: 1000 mg via ORAL
  Filled 2023-09-11: qty 2

## 2023-09-11 MED ORDER — DEXTROSE 50 % IV SOLN
INTRAVENOUS | Status: AC
  Administered 2023-09-11: 25 mL via INTRAVENOUS
  Filled 2023-09-11: qty 50

## 2023-09-11 MED ORDER — ONDANSETRON HCL 4 MG/2ML IJ SOLN
INTRAMUSCULAR | Status: AC
  Filled 2023-09-11: qty 2

## 2023-09-11 MED ORDER — DEXAMETHASONE SODIUM PHOSPHATE 10 MG/ML IJ SOLN
INTRAMUSCULAR | Status: AC
Start: 2023-09-11 — End: ?
  Filled 2023-09-11: qty 1

## 2023-09-11 MED ORDER — BUPIVACAINE HCL (PF) 0.5 % IJ SOLN
INTRAMUSCULAR | Status: AC
  Filled 2023-09-11: qty 30

## 2023-09-11 MED ORDER — GLYCOPYRROLATE PF 0.2 MG/ML IJ SOSY
PREFILLED_SYRINGE | INTRAMUSCULAR | Status: AC
  Filled 2023-09-11: qty 1

## 2023-09-11 MED ORDER — SODIUM CHLORIDE 0.9 % IR SOLN
Status: DC | PRN
  Administered 2023-09-11: 3000 mL

## 2023-09-11 MED ORDER — ONDANSETRON HCL 4 MG/2ML IJ SOLN: 4.0000 mg | Freq: Once | INTRAMUSCULAR | Status: DC | PRN

## 2023-09-11 MED ORDER — OXYCODONE HCL 5 MG PO TABS: 5.0000 mg | ORAL_TABLET | Freq: Once | ORAL | Status: DC | PRN

## 2023-09-11 MED ORDER — ACETAMINOPHEN 10 MG/ML IV SOLN: 1000.0000 mg | Freq: Once | INTRAVENOUS | Status: DC | PRN

## 2023-09-11 MED ORDER — ONDANSETRON HCL 4 MG/2ML IJ SOLN
INTRAMUSCULAR | Status: DC | PRN
  Administered 2023-09-11: 4 mg via INTRAVENOUS

## 2023-09-11 MED ORDER — MIDAZOLAM HCL 2 MG/2ML IJ SOLN
INTRAMUSCULAR | Status: AC
  Filled 2023-09-11: qty 2

## 2023-09-11 MED ORDER — OXYCODONE HCL 5 MG/5ML PO SOLN: 5.0000 mg | Freq: Once | ORAL | Status: DC | PRN

## 2023-09-11 MED ORDER — FENTANYL CITRATE (PF) 100 MCG/2ML IJ SOLN: 25.0000 ug | INTRAMUSCULAR | Status: DC | PRN

## 2023-09-11 MED ORDER — CHLORHEXIDINE GLUCONATE 0.12 % MT SOLN
15.0000 mL | Freq: Once | OROMUCOSAL | Status: AC
  Administered 2023-09-11: 15 mL via OROMUCOSAL
  Filled 2023-09-11: qty 15

## 2023-09-11 MED ORDER — MIDAZOLAM HCL 2 MG/2ML IJ SOLN
INTRAMUSCULAR | Status: DC | PRN
  Administered 2023-09-11: 1 mg via INTRAVENOUS

## 2023-09-11 MED ORDER — PROPOFOL 10 MG/ML IV BOLUS
INTRAVENOUS | Status: DC | PRN
  Administered 2023-09-11: 150 mg via INTRAVENOUS

## 2023-09-11 MED ORDER — PHENYLEPHRINE 80 MCG/ML (10ML) SYRINGE FOR IV PUSH (FOR BLOOD PRESSURE SUPPORT)
PREFILLED_SYRINGE | INTRAVENOUS | Status: AC
  Filled 2023-09-11: qty 10

## 2023-09-11 SURGICAL SUPPLY — 16 items
CATH ROBINSON RED A/P 16FR (CATHETERS) IMPLANT
DEVICE MYOSURE LITE (MISCELLANEOUS) IMPLANT
DEVICE MYOSURE REACH (MISCELLANEOUS) IMPLANT
GAUZE 4X4 16PLY ~~LOC~~+RFID DBL (SPONGE) IMPLANT
GLOVE BIOGEL PI IND STRL 7.0 (GLOVE) IMPLANT
GLOVE ECLIPSE 7.0 STRL STRAW (GLOVE) ×2 IMPLANT
GOWN STRL REUS W/TWL LRG LVL3 (GOWN DISPOSABLE) ×2 IMPLANT
GOWN STRL REUS W/TWL XL LVL3 (GOWN DISPOSABLE) ×2 IMPLANT
KIT PROCEDURE FLUENT (KITS) ×2 IMPLANT
KIT TURNOVER KIT A (KITS) ×2 IMPLANT
MYOSURE XL FIBROID (MISCELLANEOUS)
PACK VAGINAL MINOR WOMEN LF (CUSTOM PROCEDURE TRAY) ×2 IMPLANT
PAD OB MATERNITY 4.3X12.25 (PERSONAL CARE ITEMS) ×2 IMPLANT
SEAL CERVICAL OMNI LOK (ABLATOR) IMPLANT
SEAL ROD LENS SCOPE MYOSURE (ABLATOR) ×2 IMPLANT
SYSTEM TISS REMOVAL MYOSURE XL (MISCELLANEOUS) IMPLANT

## 2023-09-11 NOTE — Anesthesia Preprocedure Evaluation (Addendum)
Anesthesia Evaluation  Patient identified by MRN, date of birth, ID band Patient awake    Reviewed: Allergy & Precautions, NPO status , Patient's Chart, lab work & pertinent test results, reviewed documented beta blocker date and time   History of Anesthesia Complications Negative for: history of anesthetic complications  Airway Mallampati: III  TM Distance: >3 FB Neck ROM: Limited    Dental  (+) Poor Dentition,    Pulmonary COPD, neg recent URI, former smoker, neg PE   breath sounds clear to auscultation       Cardiovascular (-) hypertension(-) angina (-) CAD, (-) Past MI and (-) Cardiac Stents  Rhythm:Regular Rate:Normal     Neuro/Psych  Headaches, Seizures -,  PSYCHIATRIC DISORDERS Anxiety Depression Bipolar Disorder   Cervical spinal stenosis, s/p fusion  Neuromuscular disease    GI/Hepatic ,GERD  Controlled,,(+) neg Cirrhosis        Endo/Other  diabetes, Type 2    Renal/GU      Musculoskeletal  (+) Arthritis ,    Abdominal   Peds  Hematology   Anesthesia Other Findings   Reproductive/Obstetrics                              Anesthesia Physical Anesthesia Plan  ASA: 3  Anesthesia Plan: General   Post-op Pain Management:    Induction: Intravenous  PONV Risk Score and Plan: 3 and Ondansetron and Dexamethasone  Airway Management Planned: LMA  Additional Equipment:   Intra-op Plan:   Post-operative Plan: Extubation in OR  Informed Consent: I have reviewed the patients History and Physical, chart, labs and discussed the procedure including the risks, benefits and alternatives for the proposed anesthesia with the patient or authorized representative who has indicated his/her understanding and acceptance.     Dental advisory given  Plan Discussed with: CRNA  Anesthesia Plan Comments:          Anesthesia Quick Evaluation

## 2023-09-11 NOTE — Discharge Instructions (Signed)
Post-surgical Instructions, Outpatient Surgery  You may expect to feel dizzy, weak, and drowsy for as long as 24 hours after receiving the medicine that made you sleep (anesthetic). For the first 24 hours after your surgery:   Do not drive a car, ride a bicycle, participate in physical activities, or take public transportation until you are done taking narcotic pain medicines or as directed by Dr. Briscoe Deutscher.  Do not drink alcohol or take tranquilizers.  Do not take medicine that has not been prescribed by your physicians.  Do not sign important papers or make important decisions while on narcotic pain medicines.  Have a responsible person with you.   CARE OF INCISION If you have a bandage, you may remove it in one day.  If there are steri-strips or dermabond, just let this loosen on its own.  You may shower on the first day after your surgery.  Do not sit in a tub bath for one week. Avoid heavy lifting (more than 10 pounds/4.5 kilograms), pushing, or pulling.  Avoid activities that may risk injury to your incisions.   PAIN MANAGEMENT Ibuprofen 400mg .  (This is the same as 2-200mg  over the counter tablets of Motrin or ibuprofen.)  Take this every 6 hours or as needed for cramping.   Acetaminophen 1000mg  (This is the same as 2-500mg  over the counter extra strength tylenol). Take this every 6 hours for the first 3 days or as needed afterwards for pain  DO'S AND DON'T'S Do not take a tub bath for 2 weeks.  You may shower on the first day after your surgery Do not do any heavy lifting for one to two weeks.  This increases the chance of bleeding. Do move around as you feel able.  Stairs are fine.  You may begin to exercise again as you feel able.  Do not lift any weights for two weeks. Do not put anything in the vagina for two weeks--no tampons, intercourse, or douching.    REGULAR MEDIATIONS/VITAMINS: You may restart all of your regular medications as prescribed. You may restart all of your  vitamins as you normally take them.    PLEASE CALL OR SEEK MEDICAL CARE IF: You have persistent nausea and vomiting.  You have trouble eating or drinking.  You have an oral temperature above 100.5.  You have constipation that is not helped by adjusting diet or increasing fluid intake. Pain medicines are a common cause of constipation.  You have heavy vaginal bleeding You have redness or drainage from your incision(s) or there is increasing pain or tenderness near or in the surgical site.

## 2023-09-11 NOTE — Op Note (Addendum)
[facility] PROCEDURE DATE: 09/11/2023  PREOPERATIVE DIAGNOSIS: postmenopausal bleeding  POSTOPERATIVE DIAGNOSIS: postmenopausal bleeding, polyp PROCEDURE:   ultrasound guided hysteroscopic polypectomy, dilation and curettage SURGEON: [name], MD ASSISTANT:  n/a  INDICATIONS: 67 y.o. [id number] with PMB.  Risks of surgery were discussed with the patient including but not limited to: bleeding which may require transfusion; infection which may require antibiotics; injury to surrounding organs; need for additional procedures including laparotomy;  and other postoperative/anesthesia complications. Written informed consent was obtained.    FINDINGS:  Normal external genitalia, atrophic introitus, normal appearing cervix, large polyp from right endometrial wall, scattered polypoid lesions within the cavity, bilateral tubal ostia visualized  ANESTHESIA: General, paracervial block ESTIMATED BLOOD LOSS:  5 ml URINE OUTPUT: 200 ml SPECIMENS: endometrial curettings and polyp COMPLICATIONS:  None immediate.   FLUID DEFICIT: 85ml of normal saline  PROCEDURE: The patient was taken to the operating room and placed under general anesthesia. SCDs were in place.  Time out was performed. Patient was placed in dorsolithotomy in [place] stirrups. She was prepped and draped in the usual sterile fashion. A speculum was placeed in the vagina. The cervix was visualized anteriorly and grasped with a single-tooth tenaculum. Paracervical block was performed with 0.5% bupivicaine with 10 cc injected. Sequential dilation was performed with Shawnie Pons dilators and the dilation was visualized on bedside ultrasound. The hysteroscope was inserted and the endometrial cavity and inspected. There were the above findings noted in the endometrial cavity with both ostia seen. The myosure reach was used to resect the polypoid tissue. The hysteroscope was removed. Sharp curettage was performed in all 4 quadrants. All instruments  were removed from the vagina. All instrument, needle and lap counts were correct x2. The patient was awakened and is recovering in stable condition.    [name], MD Minimally Invasive Gynecologic Surgery  Obstetrics and Gynecology, [facility] for [facility] [address] [facility], [facility] 09/11/2023

## 2023-09-11 NOTE — Transfer of Care (Signed)
Immediate Anesthesia Transfer of Care Note  Patient: [name]  Procedure(s) Performed: OPERATIVE ULTRASOUND DILATATION & CURETTAGE/HYSTEROSCOPY WITH MYOSURE (Uterus)  Patient Location: PACU  Anesthesia Type:General  Level of Consciousness: drowsy  Airway & Oxygen Therapy: Patient Spontanous Breathing and Patient connected to face mask oxygen  Post-op Assessment: Report given to RN and Post -op Vital signs reviewed and stable  Post vital signs: Reviewed and stable  Last Vitals:  Vitals Value Taken Time  BP 144/84 09/11/23 1505  Temp 37.2 C 09/11/23 1505  Pulse 94 09/11/23 1512  Resp 13 09/11/23 1512  SpO2 100 % 09/11/23 1512  Vitals shown include unfiled device data.  Last Pain:  Vitals:   09/11/23 1505  TempSrc:   PainSc: Asleep         Complications: No notable events documented.

## 2023-09-11 NOTE — Anesthesia Procedure Notes (Addendum)
Procedure Name: LMA Insertion Date/Time: 09/11/2023 2:25 PM  Performed by: Camillia Herter, CRNAPre-anesthesia Checklist: Patient identified, Emergency Drugs available, Suction available and Patient being monitored Patient Re-evaluated:Patient Re-evaluated prior to induction Oxygen Delivery Method: Circle System Utilized Preoxygenation: Pre-oxygenation with 100% oxygen Induction Type: IV induction Ventilation: Mask ventilation without difficulty LMA: LMA inserted LMA Size: 4.0 Number of attempts: 1 Airway Equipment and Method: Bite block Placement Confirmation: positive ETCO2 Tube secured with: Tape Dental Injury: Teeth and Oropharynx as per pre-operative assessment

## 2023-09-11 NOTE — Brief Op Note (Signed)
09/11/2023  2:59 PM  PATIENT:  [name]  67 y.o. female  PRE-OPERATIVE DIAGNOSIS:  postmenopausal bleeding  POST-OPERATIVE DIAGNOSIS:  postmenopausal bleeding  PROCEDURE:  Procedure(s): OPERATIVE ULTRASOUND (N/A) DILATATION & CURETTAGE/HYSTEROSCOPY WITH MYOSURE  SURGEON:  Surgeons and Role:    [name], MD - Primary  PHYSICIAN ASSISTANT: n/a  ASSISTANTS: none   ANESTHESIA:   general and paracervical block  EBL:  5 mL   BLOOD ADMINISTERED:none  DRAINS: Urinary Catheter (Foley)   LOCAL MEDICATIONS USED:  BUPIVICAINE   SPECIMEN:  Source of Specimen:  endometrial polyp and curettings  DISPOSITION OF SPECIMEN:  PATHOLOGY  COUNTS:  YES  TOURNIQUET:  * No tourniquets in log *  DICTATION: .Note written in EPIC  PLAN OF CARE: Discharge to home after PACU  PATIENT DISPOSITION:  PACU - hemodynamically stable.   Delay start of Pharmacological VTE agent (>24hrs) due to surgical blood loss or risk of bleeding: not applicable

## 2023-09-11 NOTE — H&P (Signed)
OB/GYN Pre-Op History and Physical  [name] is a 67 y.o. [id number] presenting for PMB.       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HCC)    Anxiety    Arthritis    Benign paroxysmal positional vertigo 02/24/2013   Bipolar disorder (HCC)    Cancer (HCC)    skin cancer   Cerebral palsy (HCC)    Colitis    Depression    bi polar   Diabetes mellitus, type II (HCC)    Dysplasia of cervix    GERD (gastroesophageal reflux disease)    Headache(784.0)    High cholesterol    History of chicken pox    History of measles    History of mumps    IBS (irritable bowel syndrome)    Low potassium syndrome    Melanoma in situ (HCC)    Neuromuscular disorder (HCC)    cerebral palsy   Ovarian cyst    Peripheral neuropathy    Seizures (HCC)    Stenosis, cervical spine 02/2015   Trichomonas     Past Surgical History:  Procedure Laterality Date   ANTERIOR CERVICAL DECOMP/DISCECTOMY FUSION N/A 05/25/2016   Procedure: Cervical five-six Anterior cervical decompression/diskectomy/fusion;  Surgeon: Coletta Memos, MD;  Location: [facility];  Service: Neurosurgery;  Laterality: N/A;   APPENDECTOMY     CARPAL TUNNEL RELEASE     CARPAL TUNNEL RELEASE Right 08/02/2017   Procedure: CARPAL TUNNEL RELEASE RIGHT;  Surgeon: Coletta Memos, MD;  Location: [facility] OR;  Service: Neurosurgery;  Laterality: Right;  CARPAL TUNNEL RELEASE RIGHT   CARPAL TUNNEL RELEASE Left 11/29/2017   Procedure: CARPAL TUNNEL RELEASE LEFT;  Surgeon: Coletta Memos, MD;  Location: [facility] OR;  Service: Neurosurgery;  Laterality: Left;  CARPAL TUNNEL RELEASE LEFT   COLONOSCOPY WITH PROPOFOL N/A 11/29/2016   Procedure: COLONOSCOPY WITH PROPOFOL;  Surgeon: Rachael Fee, MD;  Location: [facility];  Service: Endoscopy;  Laterality: N/A;   ESOPHAGOGASTRODUODENOSCOPY (EGD) WITH PROPOFOL N/A 11/29/2016   Procedure: ESOPHAGOGASTRODUODENOSCOPY (EGD) WITH PROPOFOL;  Surgeon: Rachael Fee, MD;  Location: [facility];   Service: Endoscopy;  Laterality: N/A;   IR INJECT/THERA/INC NEEDLE/CATH/PLC EPI/CERV/THOR [facility]  03/04/2020   LEG TENDON SURGERY     NASAL FRACTURE SURGERY     OVARIAN CYST REMOVAL     REPLACEMENT TOTAL KNEE BILATERAL  10/29/2004   rotator cuff surgery Right     OB History  Gravida Para Term Preterm AB Living  3 2 2   1 2   SAB IAB Ectopic Multiple Live Births               # Outcome Date GA Lbr Len/2nd Weight Sex Type Anes PTL Lv  3 AB 1998          2 Term 65     Vag-Spont     1 Term 15 [redacted]w[redacted]d    Vag-Spont       Social History   Socioeconomic History   Marital status: Divorced    Spouse name: Not on file   Number of children: Not on file   Years of education: graduate   Highest education level: Not on file  Occupational History   Occupation: Disabled    Employer: [employer]  Tobacco Use   Smoking status: Former    Current packs/day: 0.00    Average packs/day: 1 pack/day for 31.0 years (31.0 ttl pk-yrs)    Types: Cigarettes    Start date: 11/17/1968  Quit date: 11/18/1999    Years since quitting: 23.8   Smokeless tobacco: Never  Vaping Use   Vaping status: Never Used  Substance and Sexual Activity   Alcohol use: Not Currently    Comment: rare   Drug use: No   Sexual activity: Never  Other Topics Concern   Not on file  Social History Narrative   Right handed, Disabled. Live boyfriend , Andi Hence, Caffeine rare.  Disabled.   One level home. Bachelors degree   Social Determinants of Health   Financial Resource Strain: Not on file  Food Insecurity: Low Risk  (05/31/2023)   Received from [facility]   Hunger Vital Sign    Worried About Running Out of Food in the Last Year: Never true    Ran Out of Food in the Last Year: Never true  Transportation Needs: Not on file (05/31/2023)  Physical Activity: Not on file  Stress: Not on file  Social Connections: Not on file    Family History  Problem Relation Age of Onset   Suicidality Father    Depression  Father    Alcohol abuse Father    Emphysema Father        smoked   Drug abuse Brother    Alcohol abuse Brother    Esophageal cancer Brother    Depression Maternal Aunt    Alcohol abuse Brother    Anxiety disorder Daughter    Depression Daughter    Suicidality Daughter    Alcohol abuse Daughter    Drug abuse Son    Alcohol abuse Son    Emphysema Sister        smoked    Medications Prior to Admission  Medication Sig Dispense Refill Last Dose   albuterol (PROVENTIL HFA;VENTOLIN HFA) 108 (90 Base) MCG/ACT inhaler Inhale 1-2 puffs into the lungs every 6 (six) hours as needed for wheezing or shortness of breath.   Past Week   amitriptyline (ELAVIL) 50 MG tablet Take 1 tablet (50 mg total) by mouth at bedtime. 90 tablet 0 09/10/2023   amitriptyline (ELAVIL) 50 MG tablet Take by mouth.   09/10/2023   Ascorbic Acid (VITAMIN C) 500 MG CAPS Take 500 mg by mouth at bedtime.    Past Week   atorvastatin (LIPITOR) 40 MG tablet 1 tablet   09/10/2023   B Complex Vitamins (B COMPLEX 1 PO) Take by mouth.   09/10/2023   busPIRone (BUSPAR) 10 MG tablet TAKE 1 TABLET(10 MG) BY MOUTH TWICE DAILY 60 tablet 1 09/10/2023   fluticasone (FLONASE) 50 MCG/ACT nasal spray 2 sprays by Each Nare route daily.   09/11/2023 at 0800   gabapentin (NEURONTIN) 600 MG tablet Take 100 mg by mouth 2 (two) times daily.   09/10/2023   glipiZIDE (GLUCOTROL XL) 10 MG 24 hr tablet Take 5 mg by mouth daily.   09/10/2023   lamoTRIgine (LAMICTAL) 200 MG tablet Take 1 tablet (200 mg total) by mouth daily. (Patient taking differently: Take 150 mg by mouth daily.) 90 tablet 0 09/10/2023   metFORMIN (GLUCOPHAGE-XR) 500 MG 24 hr tablet Take 500 mg by mouth daily with breakfast. IN THE MORNING.   09/10/2023   misoprostol (CYTOTEC) 200 MCG tablet Place two tablets in the vagina the night prior to your procedure 2 tablet 0 09/10/2023   Polyethyl Glycol-Propyl Glycol 0.4-0.3 % SOLN Place 1-2 drops into both eyes 3 (three) times daily as  needed (for dry eyes.).   09/10/2023   potassium chloride (KLOR-CON) 10 MEQ tablet  Take 10 mEq by mouth daily.   09/10/2023   Propylene Glycol-Glycerin 1-0.3 % SOLN Apply 1 drop to eye in the morning and at bedtime.   09/10/2023   risedronate (ACTONEL) 150 MG tablet Take 150 mg by mouth every 30 (thirty) days.   Past Week   spironolactone (ALDACTONE) 100 MG tablet Take 100 mg by mouth daily.  5 09/10/2023   AQUALANCE LANCETS 30G MISC       Blood Glucose Monitoring Suppl (ONE TOUCH ULTRA 2) w/Device KIT       Lancet Devices (ADJUSTABLE LANCING DEVICE) MISC       magnesium 30 MG tablet Take 30 mg by mouth daily. (Patient not taking: Reported on 09/04/2023)   Not Taking   megestrol (MEGACE) 40 MG tablet Take 1 tablet (40 mg total) by mouth 2 (two) times daily. Can increase to two tablets twice a day in the event of heavy bleeding 60 tablet 0    Multiple Vitamins-Minerals (WOMENS 50+ MULTI VITAMIN PO) Take by mouth. (Patient not taking: Reported on 09/04/2023)   Not Taking   Niacin-Polypodium Leucotomos (HELIOCARE ADVANCED) 250-120 MG CAPS Take by mouth daily.   Unknown   ONE TOUCH ULTRA TEST test strip       orlistat (XENICAL) 120 MG capsule Take by mouth. (Patient not taking: Reported on 09/04/2023)   Not Taking   pilocarpine (SALAGEN) 5 MG tablet Take by mouth. (Patient not taking: Reported on 09/04/2023)   Not Taking    Allergies  Allergen Reactions   Miconazole     Other Reaction(s): burns   Miconazole Nitrate Hives and Itching   Sulfonamide Derivatives Rash    Review of Systems: Negative except for what is mentioned in HPI.     Physical Exam: BP (!) 149/87   Pulse 89   Temp 98.9 F (37.2 C) (Oral)   Resp 18   Ht 5\' 7"  (1.702 m)   Wt 113.4 kg   SpO2 99%   BMI 39.16 kg/m  CONSTITUTIONAL: Well-developed, well-nourished female in no acute distress.  HENT:  Normocephalic, atraumatic, External right and left ear normal. Oropharynx is clear and moist EYES: Conjunctivae and EOM are  normal. Pupils are equal, round, and reactive to light. No scleral icterus.  NECK: Normal range of motion, supple, no masses SKIN: Skin is warm and dry. No rash noted. Not diaphoretic. No erythema. No pallor. NEUROLGIC: Alert and oriented to person, place, and time. Normal reflexes, muscle tone coordination. No cranial nerve deficit noted. PSYCHIATRIC: Normal mood and affect. Normal behavior. Normal judgment and thought content. RESPIRATORY: normal effort PELVIC: Deferred MUSCULOSKELETAL: Normal range of motion. No edema and no tenderness. 2+ distal pulses.   Pertinent Labs/Studies:   Results for orders placed or performed during the hospital encounter of 09/11/23 (from the past 72 hour(s))  Glucose, capillary     Status: None   Collection Time: 09/11/23 10:18 AM  Result Value Ref Range   Glucose-Capillary 80 70 - 99 mg/dL    Comment: Glucose reference range applies only to samples taken after fasting for at least 8 hours.  Basic metabolic panel per protocol     Status: Abnormal   Collection Time: 09/11/23 10:33 AM  Result Value Ref Range   Sodium 137 135 - 145 mmol/L   Potassium 3.8 3.5 - 5.1 mmol/L   Chloride 106 98 - 111 mmol/L   CO2 18 (L) 22 - 32 mmol/L   Glucose, Bld 85 70 - 99 mg/dL    Comment: Glucose  reference range applies only to samples taken after fasting for at least 8 hours.   BUN 14 8 - 23 mg/dL   Creatinine, Ser 4.09 0.44 - 1.00 mg/dL   Calcium 9.2 8.9 - 81.1 mg/dL   GFR, Estimated >91 >47 mL/min    Comment: (NOTE) Calculated using the CKD-EPI Creatinine Equation (2021)    Anion gap 13 5 - 15    Comment: Performed at [facility], [address], [place], [place] [address]  CBC per protocol     Status: Abnormal   Collection Time: 09/11/23 10:33 AM  Result Value Ref Range   WBC 15.9 (H) 4.0 - 10.5 K/uL   RBC 5.31 (H) 3.87 - 5.11 MIL/uL   Hemoglobin 15.1 (H) 12.0 - 15.0 g/dL   HCT 21.3 (H) 08.6 - 57.8 %   MCV 88.1 80.0 - 100.0 fL   MCH 28.4 26.0 - 34.0  pg   MCHC 32.3 30.0 - 36.0 g/dL   RDW 46.9 62.9 - 52.8 %   Platelets 416 (H) 150 - 400 K/uL   nRBC 0.0 0.0 - 0.2 %    Comment: Performed at [facility], [address], [place], [place] [address]       Assessment and Plan :JUSTISS GINSBERG is a 67 y.o. [id number] here for PMB.   Risks, benefits, and alternatives reviewed   [name], M.D. Minimally Invasive Gynecologic Surgery and Pelvic Pain Specialist Attending Obstetrician & Gynecologist, [facility] for [employer], [facility]

## 2023-09-11 NOTE — Anesthesia Postprocedure Evaluation (Signed)
Anesthesia Post Note  Patient: [name]  Procedure(s) Performed: OPERATIVE ULTRASOUND DILATATION & CURETTAGE/HYSTEROSCOPY WITH MYOSURE (Uterus)     Patient location during evaluation: PACU Anesthesia Type: General Level of consciousness: awake and alert, patient cooperative and oriented Pain management: pain level controlled Vital Signs Assessment: post-procedure vital signs reviewed and stable Respiratory status: spontaneous breathing, nonlabored ventilation and respiratory function stable Cardiovascular status: blood pressure returned to baseline and stable Postop Assessment: no apparent nausea or vomiting and adequate PO intake Anesthetic complications: no   No notable events documented.  Last Vitals:  Vitals:   09/11/23 1545 09/11/23 1600  BP: 138/81 (!) 143/89  Pulse: 89 96  Resp: 14 14  Temp:  37.1 C  SpO2: 100% 96%    Last Pain:  Vitals:   09/11/23 1505  TempSrc:   PainSc: Asleep                 Rosalita Carey,E. Kristol Almanzar

## 2023-09-12 ENCOUNTER — Telehealth: Payer: Self-pay | Admitting: *Deleted

## 2023-09-12 ENCOUNTER — Encounter (HOSPITAL_COMMUNITY): Payer: Self-pay | Admitting: Obstetrics and Gynecology

## 2023-09-12 LAB — SURGICAL PATHOLOGY

## 2023-09-12 NOTE — Telephone Encounter (Signed)
Pt left voicemail message stating that she had surgery yesterday and was expecting a prescription to be called in for her.  I called her back and she stated that she was having some bleeding and pain however both problems have become less intense.  She stated that she didn't think she would need anything for the bleeding and that she will try taking Tylenol.  I advised that she may also try Ibuprofen 600 mg every 6 hours as needed for pain. Pt may call back if those remedies are not effective or for other concerns. She voiced understanding.

## 2023-09-13 ENCOUNTER — Telehealth: Payer: Self-pay | Admitting: Obstetrics and Gynecology

## 2023-09-13 NOTE — Telephone Encounter (Signed)
Called and confirmed ID [id number]. Continues to have some bleeding sine procedure. Reviewed pathology demonstrated benign polyp. Noted that postoperative bleeding should resolve over the next several days. Don't anticipate return of bleeding now that polyp has been removed. Return if bleeding pattern returns.   FINAL MICROSCOPIC DIAGNOSIS:   A. ENDOMETRIAL, CURETTAGE AND POLYPS:  Fragments of benign endometrial polyp and inactive endometrium.  Negative for atypia, hyperplasia or malignancy.

## 2023-09-18 DIAGNOSIS — E220 Acromegaly and pituitary gigantism: Secondary | ICD-10-CM | POA: Diagnosis not present

## 2023-09-18 DIAGNOSIS — N319 Neuromuscular dysfunction of bladder, unspecified: Secondary | ICD-10-CM | POA: Diagnosis not present

## 2023-09-18 DIAGNOSIS — Z466 Encounter for fitting and adjustment of urinary device: Secondary | ICD-10-CM | POA: Diagnosis not present

## 2023-09-18 DIAGNOSIS — Z9181 History of falling: Secondary | ICD-10-CM | POA: Diagnosis not present

## 2023-09-18 DIAGNOSIS — J309 Allergic rhinitis, unspecified: Secondary | ICD-10-CM | POA: Diagnosis not present

## 2023-09-18 DIAGNOSIS — G809 Cerebral palsy, unspecified: Secondary | ICD-10-CM | POA: Diagnosis not present

## 2023-09-18 DIAGNOSIS — R600 Localized edema: Secondary | ICD-10-CM | POA: Diagnosis not present

## 2023-09-18 DIAGNOSIS — K760 Fatty (change of) liver, not elsewhere classified: Secondary | ICD-10-CM | POA: Diagnosis not present

## 2023-09-18 DIAGNOSIS — J439 Emphysema, unspecified: Secondary | ICD-10-CM | POA: Diagnosis not present

## 2023-09-18 DIAGNOSIS — M8008XD Age-related osteoporosis with current pathological fracture, vertebra(e), subsequent encounter for fracture with routine healing: Secondary | ICD-10-CM | POA: Diagnosis not present

## 2023-09-18 DIAGNOSIS — K589 Irritable bowel syndrome without diarrhea: Secondary | ICD-10-CM | POA: Diagnosis not present

## 2023-09-18 DIAGNOSIS — E1142 Type 2 diabetes mellitus with diabetic polyneuropathy: Secondary | ICD-10-CM | POA: Diagnosis not present

## 2023-09-18 DIAGNOSIS — E782 Mixed hyperlipidemia: Secondary | ICD-10-CM | POA: Diagnosis not present

## 2023-09-18 DIAGNOSIS — Z87891 Personal history of nicotine dependence: Secondary | ICD-10-CM | POA: Diagnosis not present

## 2023-09-18 DIAGNOSIS — K219 Gastro-esophageal reflux disease without esophagitis: Secondary | ICD-10-CM | POA: Diagnosis not present

## 2023-09-18 DIAGNOSIS — Z7984 Long term (current) use of oral hypoglycemic drugs: Secondary | ICD-10-CM | POA: Diagnosis not present

## 2023-09-20 DIAGNOSIS — G809 Cerebral palsy, unspecified: Secondary | ICD-10-CM | POA: Diagnosis not present

## 2023-09-24 ENCOUNTER — Telehealth: Payer: Self-pay | Admitting: General Practice

## 2023-09-24 NOTE — Telephone Encounter (Signed)
Patient called and left message on nurse voicemail line stating she missed a phone call from someone within [facility] but isn't sure who from or what it was regarding.   Called patient and discussed we haven't called her and I cannot tell who it is but I'm sure if it is important someone will call her back. Patient had no questions/concerns

## 2023-09-25 DIAGNOSIS — Z9181 History of falling: Secondary | ICD-10-CM | POA: Diagnosis not present

## 2023-09-25 DIAGNOSIS — N319 Neuromuscular dysfunction of bladder, unspecified: Secondary | ICD-10-CM | POA: Diagnosis not present

## 2023-09-25 DIAGNOSIS — J309 Allergic rhinitis, unspecified: Secondary | ICD-10-CM | POA: Diagnosis not present

## 2023-09-25 DIAGNOSIS — Z7984 Long term (current) use of oral hypoglycemic drugs: Secondary | ICD-10-CM | POA: Diagnosis not present

## 2023-09-25 DIAGNOSIS — K589 Irritable bowel syndrome without diarrhea: Secondary | ICD-10-CM | POA: Diagnosis not present

## 2023-09-25 DIAGNOSIS — M8008XD Age-related osteoporosis with current pathological fracture, vertebra(e), subsequent encounter for fracture with routine healing: Secondary | ICD-10-CM | POA: Diagnosis not present

## 2023-09-25 DIAGNOSIS — Z87891 Personal history of nicotine dependence: Secondary | ICD-10-CM | POA: Diagnosis not present

## 2023-09-25 DIAGNOSIS — E1142 Type 2 diabetes mellitus with diabetic polyneuropathy: Secondary | ICD-10-CM | POA: Diagnosis not present

## 2023-09-25 DIAGNOSIS — R600 Localized edema: Secondary | ICD-10-CM | POA: Diagnosis not present

## 2023-09-25 DIAGNOSIS — J439 Emphysema, unspecified: Secondary | ICD-10-CM | POA: Diagnosis not present

## 2023-09-25 DIAGNOSIS — E220 Acromegaly and pituitary gigantism: Secondary | ICD-10-CM | POA: Diagnosis not present

## 2023-09-25 DIAGNOSIS — K760 Fatty (change of) liver, not elsewhere classified: Secondary | ICD-10-CM | POA: Diagnosis not present

## 2023-09-25 DIAGNOSIS — K219 Gastro-esophageal reflux disease without esophagitis: Secondary | ICD-10-CM | POA: Diagnosis not present

## 2023-09-25 DIAGNOSIS — E782 Mixed hyperlipidemia: Secondary | ICD-10-CM | POA: Diagnosis not present

## 2023-09-25 DIAGNOSIS — G809 Cerebral palsy, unspecified: Secondary | ICD-10-CM | POA: Diagnosis not present

## 2023-09-25 DIAGNOSIS — Z466 Encounter for fitting and adjustment of urinary device: Secondary | ICD-10-CM | POA: Diagnosis not present

## 2023-09-27 DIAGNOSIS — G809 Cerebral palsy, unspecified: Secondary | ICD-10-CM | POA: Diagnosis not present

## 2023-09-27 DIAGNOSIS — R600 Localized edema: Secondary | ICD-10-CM | POA: Diagnosis not present

## 2023-09-27 DIAGNOSIS — E1142 Type 2 diabetes mellitus with diabetic polyneuropathy: Secondary | ICD-10-CM | POA: Diagnosis not present

## 2023-09-27 DIAGNOSIS — Z87891 Personal history of nicotine dependence: Secondary | ICD-10-CM | POA: Diagnosis not present

## 2023-09-27 DIAGNOSIS — Z7984 Long term (current) use of oral hypoglycemic drugs: Secondary | ICD-10-CM | POA: Diagnosis not present

## 2023-09-27 DIAGNOSIS — M8008XD Age-related osteoporosis with current pathological fracture, vertebra(e), subsequent encounter for fracture with routine healing: Secondary | ICD-10-CM | POA: Diagnosis not present

## 2023-09-27 DIAGNOSIS — N319 Neuromuscular dysfunction of bladder, unspecified: Secondary | ICD-10-CM | POA: Diagnosis not present

## 2023-09-27 DIAGNOSIS — J309 Allergic rhinitis, unspecified: Secondary | ICD-10-CM | POA: Diagnosis not present

## 2023-09-27 DIAGNOSIS — Z9181 History of falling: Secondary | ICD-10-CM | POA: Diagnosis not present

## 2023-09-27 DIAGNOSIS — J439 Emphysema, unspecified: Secondary | ICD-10-CM | POA: Diagnosis not present

## 2023-09-27 DIAGNOSIS — Z466 Encounter for fitting and adjustment of urinary device: Secondary | ICD-10-CM | POA: Diagnosis not present

## 2023-09-27 DIAGNOSIS — K589 Irritable bowel syndrome without diarrhea: Secondary | ICD-10-CM | POA: Diagnosis not present

## 2023-09-27 DIAGNOSIS — E220 Acromegaly and pituitary gigantism: Secondary | ICD-10-CM | POA: Diagnosis not present

## 2023-09-27 DIAGNOSIS — K760 Fatty (change of) liver, not elsewhere classified: Secondary | ICD-10-CM | POA: Diagnosis not present

## 2023-09-27 DIAGNOSIS — K219 Gastro-esophageal reflux disease without esophagitis: Secondary | ICD-10-CM | POA: Diagnosis not present

## 2023-09-27 DIAGNOSIS — E782 Mixed hyperlipidemia: Secondary | ICD-10-CM | POA: Diagnosis not present

## 2023-09-29 DIAGNOSIS — N3021 Other chronic cystitis with hematuria: Secondary | ICD-10-CM | POA: Diagnosis not present

## 2023-09-29 DIAGNOSIS — R3914 Feeling of incomplete bladder emptying: Secondary | ICD-10-CM | POA: Diagnosis not present

## 2023-10-03 ENCOUNTER — Other Ambulatory Visit: Payer: Self-pay

## 2023-10-03 ENCOUNTER — Ambulatory Visit (HOSPITAL_BASED_OUTPATIENT_CLINIC_OR_DEPARTMENT_OTHER): Payer: 59 | Admitting: Psychiatry

## 2023-10-03 ENCOUNTER — Encounter (HOSPITAL_COMMUNITY): Payer: Self-pay | Admitting: Psychiatry

## 2023-10-03 VITALS — BP 133/82 | HR 87 | Ht 67.0 in | Wt 250.0 lb

## 2023-10-03 DIAGNOSIS — F419 Anxiety disorder, unspecified: Secondary | ICD-10-CM

## 2023-10-03 DIAGNOSIS — F319 Bipolar disorder, unspecified: Secondary | ICD-10-CM | POA: Diagnosis not present

## 2023-10-03 DIAGNOSIS — R413 Other amnesia: Secondary | ICD-10-CM | POA: Diagnosis not present

## 2023-10-03 MED ORDER — AMITRIPTYLINE HCL 50 MG PO TABS: 50.0000 mg | ORAL_TABLET | Freq: Every day | ORAL | 0 refills | Status: DC

## 2023-10-03 MED ORDER — LAMOTRIGINE 200 MG PO TABS: 200.0000 mg | ORAL_TABLET | Freq: Every day | ORAL | 0 refills | Status: DC
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
## 2023-10-08 DIAGNOSIS — Z466 Encounter for fitting and adjustment of urinary device: Secondary | ICD-10-CM | POA: Diagnosis not present

## 2023-10-08 DIAGNOSIS — N319 Neuromuscular dysfunction of bladder, unspecified: Secondary | ICD-10-CM | POA: Diagnosis not present

## 2023-10-08 DIAGNOSIS — K219 Gastro-esophageal reflux disease without esophagitis: Secondary | ICD-10-CM | POA: Diagnosis not present

## 2023-10-08 DIAGNOSIS — M8008XD Age-related osteoporosis with current pathological fracture, vertebra(e), subsequent encounter for fracture with routine healing: Secondary | ICD-10-CM | POA: Diagnosis not present

## 2023-10-08 DIAGNOSIS — R600 Localized edema: Secondary | ICD-10-CM | POA: Diagnosis not present

## 2023-10-08 DIAGNOSIS — E1142 Type 2 diabetes mellitus with diabetic polyneuropathy: Secondary | ICD-10-CM | POA: Diagnosis not present

## 2023-10-08 DIAGNOSIS — J309 Allergic rhinitis, unspecified: Secondary | ICD-10-CM | POA: Diagnosis not present

## 2023-10-08 DIAGNOSIS — K589 Irritable bowel syndrome without diarrhea: Secondary | ICD-10-CM | POA: Diagnosis not present

## 2023-10-08 DIAGNOSIS — Z87891 Personal history of nicotine dependence: Secondary | ICD-10-CM | POA: Diagnosis not present

## 2023-10-08 DIAGNOSIS — G809 Cerebral palsy, unspecified: Secondary | ICD-10-CM | POA: Diagnosis not present

## 2023-10-08 DIAGNOSIS — Z9181 History of falling: Secondary | ICD-10-CM | POA: Diagnosis not present

## 2023-10-08 DIAGNOSIS — K760 Fatty (change of) liver, not elsewhere classified: Secondary | ICD-10-CM | POA: Diagnosis not present

## 2023-10-08 DIAGNOSIS — J439 Emphysema, unspecified: Secondary | ICD-10-CM | POA: Diagnosis not present

## 2023-10-08 DIAGNOSIS — E220 Acromegaly and pituitary gigantism: Secondary | ICD-10-CM | POA: Diagnosis not present

## 2023-10-08 DIAGNOSIS — Z7984 Long term (current) use of oral hypoglycemic drugs: Secondary | ICD-10-CM | POA: Diagnosis not present

## 2023-10-08 DIAGNOSIS — E782 Mixed hyperlipidemia: Secondary | ICD-10-CM | POA: Diagnosis not present

## 2023-10-10 ENCOUNTER — Ambulatory Visit: Payer: 59 | Admitting: Obstetrics and Gynecology

## 2023-10-20 DIAGNOSIS — G809 Cerebral palsy, unspecified: Secondary | ICD-10-CM | POA: Diagnosis not present

## 2023-10-30 DIAGNOSIS — N3021 Other chronic cystitis with hematuria: Secondary | ICD-10-CM | POA: Diagnosis not present

## 2023-10-30 DIAGNOSIS — R3914 Feeling of incomplete bladder emptying: Secondary | ICD-10-CM | POA: Diagnosis not present

## 2023-11-01 DIAGNOSIS — R262 Difficulty in walking, not elsewhere classified: Secondary | ICD-10-CM | POA: Diagnosis not present

## 2023-11-01 DIAGNOSIS — E782 Mixed hyperlipidemia: Secondary | ICD-10-CM | POA: Diagnosis not present

## 2023-11-01 DIAGNOSIS — Z139 Encounter for screening, unspecified: Secondary | ICD-10-CM | POA: Diagnosis not present

## 2023-11-01 DIAGNOSIS — E118 Type 2 diabetes mellitus with unspecified complications: Secondary | ICD-10-CM | POA: Diagnosis not present

## 2023-11-01 DIAGNOSIS — G809 Cerebral palsy, unspecified: Secondary | ICD-10-CM | POA: Diagnosis not present

## 2023-11-01 DIAGNOSIS — K760 Fatty (change of) liver, not elsewhere classified: Secondary | ICD-10-CM | POA: Diagnosis not present

## 2023-11-01 DIAGNOSIS — N390 Urinary tract infection, site not specified: Secondary | ICD-10-CM | POA: Diagnosis not present

## 2023-11-01 DIAGNOSIS — Z9181 History of falling: Secondary | ICD-10-CM | POA: Diagnosis not present

## 2023-11-19 ENCOUNTER — Ambulatory Visit: Payer: 59 | Admitting: Family

## 2023-11-20 DIAGNOSIS — G809 Cerebral palsy, unspecified: Secondary | ICD-10-CM | POA: Diagnosis not present

## 2023-11-28 DIAGNOSIS — G3184 Mild cognitive impairment, so stated: Secondary | ICD-10-CM | POA: Diagnosis not present

## 2023-11-28 DIAGNOSIS — R569 Unspecified convulsions: Secondary | ICD-10-CM | POA: Diagnosis not present

## 2023-11-28 DIAGNOSIS — G4733 Obstructive sleep apnea (adult) (pediatric): Secondary | ICD-10-CM | POA: Diagnosis not present

## 2023-11-28 DIAGNOSIS — M4802 Spinal stenosis, cervical region: Secondary | ICD-10-CM | POA: Diagnosis not present

## 2023-11-28 DIAGNOSIS — G4761 Periodic limb movement disorder: Secondary | ICD-10-CM | POA: Diagnosis not present

## 2023-11-28 DIAGNOSIS — G43109 Migraine with aura, not intractable, without status migrainosus: Secondary | ICD-10-CM | POA: Diagnosis not present

## 2023-11-30 DIAGNOSIS — R3914 Feeling of incomplete bladder emptying: Secondary | ICD-10-CM | POA: Diagnosis not present

## 2023-11-30 DIAGNOSIS — N3021 Other chronic cystitis with hematuria: Secondary | ICD-10-CM | POA: Diagnosis not present

## 2023-12-02 DIAGNOSIS — R3914 Feeling of incomplete bladder emptying: Secondary | ICD-10-CM | POA: Diagnosis not present

## 2023-12-02 DIAGNOSIS — N3021 Other chronic cystitis with hematuria: Secondary | ICD-10-CM | POA: Diagnosis not present

## 2023-12-09 ENCOUNTER — Other Ambulatory Visit (HOSPITAL_COMMUNITY): Payer: Self-pay | Admitting: Psychiatry

## 2023-12-09 DIAGNOSIS — F419 Anxiety disorder, unspecified: Secondary | ICD-10-CM

## 2023-12-09 DIAGNOSIS — F319 Bipolar disorder, unspecified: Secondary | ICD-10-CM

## 2023-12-09 DIAGNOSIS — R413 Other amnesia: Secondary | ICD-10-CM

## 2023-12-21 DIAGNOSIS — G809 Cerebral palsy, unspecified: Secondary | ICD-10-CM | POA: Diagnosis not present

## 2023-12-25 ENCOUNTER — Ambulatory Visit: Payer: 59 | Admitting: Family

## 2023-12-28 DIAGNOSIS — R3914 Feeling of incomplete bladder emptying: Secondary | ICD-10-CM | POA: Diagnosis not present

## 2023-12-28 DIAGNOSIS — N3021 Other chronic cystitis with hematuria: Secondary | ICD-10-CM | POA: Diagnosis not present

## 2023-12-29 DIAGNOSIS — N3021 Other chronic cystitis with hematuria: Secondary | ICD-10-CM | POA: Diagnosis not present

## 2023-12-29 DIAGNOSIS — R3914 Feeling of incomplete bladder emptying: Secondary | ICD-10-CM | POA: Diagnosis not present

## 2023-12-30 DIAGNOSIS — R3914 Feeling of incomplete bladder emptying: Secondary | ICD-10-CM | POA: Diagnosis not present

## 2023-12-30 DIAGNOSIS — N3021 Other chronic cystitis with hematuria: Secondary | ICD-10-CM | POA: Diagnosis not present

## 2024-01-02 ENCOUNTER — Ambulatory Visit (HOSPITAL_BASED_OUTPATIENT_CLINIC_OR_DEPARTMENT_OTHER): Payer: 59 | Admitting: Psychiatry

## 2024-01-02 ENCOUNTER — Encounter (HOSPITAL_COMMUNITY): Payer: Self-pay | Admitting: Psychiatry

## 2024-01-02 DIAGNOSIS — F319 Bipolar disorder, unspecified: Secondary | ICD-10-CM | POA: Diagnosis not present

## 2024-01-02 DIAGNOSIS — R413 Other amnesia: Secondary | ICD-10-CM

## 2024-01-02 DIAGNOSIS — F419 Anxiety disorder, unspecified: Secondary | ICD-10-CM | POA: Diagnosis not present

## 2024-01-02 MED ORDER — AMITRIPTYLINE HCL 50 MG PO TABS: 50.0000 mg | ORAL_TABLET | Freq: Every day | ORAL | 0 refills | Status: DC

## 2024-01-02 MED ORDER — LAMOTRIGINE 200 MG PO TABS: 200.0000 mg | ORAL_TABLET | Freq: Every day | ORAL | 0 refills | Status: DC
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
## 2024-01-18 DIAGNOSIS — G809 Cerebral palsy, unspecified: Secondary | ICD-10-CM | POA: Diagnosis not present

## 2024-01-22 ENCOUNTER — Other Ambulatory Visit (INDEPENDENT_AMBULATORY_CARE_PROVIDER_SITE_OTHER)

## 2024-01-22 ENCOUNTER — Encounter: Payer: Self-pay | Admitting: Family

## 2024-01-22 ENCOUNTER — Ambulatory Visit (INDEPENDENT_AMBULATORY_CARE_PROVIDER_SITE_OTHER): Admitting: Family

## 2024-01-22 ENCOUNTER — Encounter: Payer: Self-pay | Admitting: Internal Medicine

## 2024-01-22 ENCOUNTER — Other Ambulatory Visit (HOSPITAL_COMMUNITY): Payer: Self-pay | Admitting: Psychiatry

## 2024-01-22 DIAGNOSIS — M25511 Pain in right shoulder: Secondary | ICD-10-CM

## 2024-01-22 DIAGNOSIS — G8929 Other chronic pain: Secondary | ICD-10-CM

## 2024-01-22 DIAGNOSIS — M542 Cervicalgia: Secondary | ICD-10-CM | POA: Diagnosis not present

## 2024-01-22 DIAGNOSIS — M25512 Pain in left shoulder: Secondary | ICD-10-CM

## 2024-01-22 DIAGNOSIS — F319 Bipolar disorder, unspecified: Secondary | ICD-10-CM

## 2024-01-22 DIAGNOSIS — M19011 Primary osteoarthritis, right shoulder: Secondary | ICD-10-CM

## 2024-01-22 MED ORDER — METHOCARBAMOL 500 MG PO TABS: 500.0000 mg | ORAL_TABLET | Freq: Three times a day (TID) | ORAL | 0 refills | Status: DC

## 2024-01-22 MED ORDER — METHOCARBAMOL 500 MG PO TABS: 500.0000 mg | ORAL_TABLET | Freq: Three times a day (TID) | ORAL | 0 refills | Status: AC

## 2024-01-22 NOTE — Progress Notes (Signed)
 Office Visit Note   Patient: [name]           Date of Birth: 02/10/1956           MRN: [id number] Visit Date: 01/22/2024              Requested by: Lonie Peak, PA-C [address] [place],  [place] [address] PCP: Lonie Peak, PA-C  Chief Complaint  Patient presents with   Right Shoulder - Pain   Neck - Pain      HPI: The patient is a 68 year old woman who presents today for evaluation of right sided neck and anterior right shoulder pain.  She is concerned about a tender bony prominence in the right shoulder.  Reports this is been present for years now she complains of intermittent pain especially with deep palpation.  She did have a fall onto her right shoulder over a year ago.  She has recently had return of the painful symptoms that occurred after her fall about a year ago.  She is having anterior shoulder pain which radiates into her neck, right sided.  Radiographs performed last March at the time of her injury were negative for acute finding.  Does have a history of neck surgery with Dr. Franky Macho x 2  She has full range of motion of her shoulder but does have some pain with above head reaching.  She has not had any relief with Tylenol she did use some lidocaine patches which have provided good relief  She is right-hand dominant  Assessment & Plan: Visit Diagnoses:  1. Chronic pain of both shoulders     Plan: GETA paperwork filled out and mailed.  Discussed her osteoarthritis and bony spurring at the [facility] joint.  The patient would like to consider having this surgically debrided.  We will call her next week after speaking with Dr. Lajoyce Corners.  Robaxin for her soft tissue pain and spasm  Follow-Up Instructions: No follow-ups on file.   Back Exam   Tenderness  The patient is experiencing no tenderness.   Comments:  Soft tissue tenderness neck right sided.  No palpable spasm.   Right Shoulder Exam   Other  Erythema: absent Pulse:  present      Patient is alert, oriented, no adenopathy, well-dressed, normal affect, normal respiratory effort. On examination right shoulder palpable osteophyte to the distal clavicle.  There is no tenting of the skin no impending skin breakdown.  There is no erythema  Full active range of motion. Imaging: No results found. No images are attached to the encounter.  Labs: Lab Results  Component Value Date   HGBA1C 6.0 (H) 05/30/2023   HGBA1C 7.2 (H) 11/29/2017   HGBA1C 6.6 (H) 07/30/2017   REPTSTATUS 07/28/2023 FINAL 07/25/2023   CULT >=100,000 COLONIES/mL ESCHERICHIA COLI (A) 07/25/2023   LABORGA ESCHERICHIA COLI (A) 07/25/2023     Lab Results  Component Value Date   ALBUMIN 4.5 12/15/2015   ALBUMIN 3.9 11/18/2013   ALBUMIN 3.9 07/07/2013    Lab Results  Component Value Date   MG 1.9 07/08/2013   No results found for: "VD25OH"  No results found for: "PREALBUMIN"    Latest Ref Rng & Units 09/11/2023   10:33 AM 07/25/2023    5:08 PM 05/16/2023    6:09 PM  CBC EXTENDED  WBC 4.0 - 10.5 K/uL 15.9  11.8  14.0   RBC 3.87 - 5.11 MIL/uL 5.31  5.00  5.33   Hemoglobin 12.0 - 15.0 g/dL 15.1  14.7  15.1   HCT 36.0 - 46.0 % 46.8  45.7  46.7   Platelets 150 - 400 K/uL 416  381  414      There is no height or weight on file to calculate BMI.  Orders:  Orders Placed This Encounter  Procedures   XR Shoulder Right   No orders of the defined types were placed in this encounter.    Procedures: No procedures performed  Clinical Data: No additional findings.  ROS:  All other systems negative, except as noted in the HPI. Review of Systems  Objective: Vital Signs: There were no vitals taken for this visit.  Specialty Comments:  No specialty comments available.  PMFS History: Patient Active Problem List   Diagnosis Date Noted   Bilateral shoulder pain 08/09/2022   Neck pain 11/21/2018   Low back pain 08/19/2018   Weakness of both lower extremities  08/19/2018   Pain in finger of left hand 05/28/2018   Pain in right hand 05/28/2018   Pain in left elbow 04/02/2018   Chronic right shoulder pain 07/16/2017   Rotator cuff syndrome, right 05/13/2017   Cervical radiculopathy 01/14/2017   Colon cancer screening    Encounter for screening for gastric cancer    Osteoarthritis of spine with radiculopathy, cervical region 05/25/2016   Postmenopausal bleeding 08/04/2015   Acute respiratory failure (HCC) 07/19/2013   Nocturnal hypoxemia 07/07/2013   Lower urinary tract infectious disease 07/07/2013   Hypokalemia 07/07/2013   Abdominal pain, acute, right lower quadrant 07/07/2013   Benign paroxysmal positional vertigo 02/24/2013   Edema of both legs 02/24/2013   Dysplasia of cervix    Melanoma in situ [facility])    Bipolar 1 disorder (HCC) 02/08/2012   IBS 12/04/2007   RASH AND OTHER NONSPECIFIC SKIN ERUPTION 12/04/2007   EMPHYSEMA by CT only 09/05/2007   NECK MASS 09/05/2007   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HCC) 08/13/2007   Cerebral palsy (HCC) 08/13/2007   OTITIS EXTERNA, ACUTE 08/13/2007   IRRITABLE BOWEL SYNDROME, HX OF 08/13/2007   HYPERLIPIDEMIA 05/10/2007   DEPRESSION 05/10/2007   ALLERGIC RHINITIS 05/10/2007   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HCC)    Anxiety    Arthritis    Benign paroxysmal positional vertigo 02/24/2013   Bipolar disorder (HCC)    Cancer (HCC)    skin cancer   Cerebral palsy (HCC)    Colitis    Depression    bi polar   Diabetes mellitus, type II (HCC)    Dysplasia of cervix    GERD (gastroesophageal reflux disease)    Headache(784.0)    High cholesterol    History of chicken pox    History of measles    History of mumps    IBS (irritable bowel syndrome)    Low potassium syndrome    Melanoma in situ (HCC)    Neuromuscular disorder (HCC)    cerebral palsy   Ovarian cyst    Peripheral neuropathy    Seizures (HCC)    Stenosis, cervical spine 02/2015   Trichomonas     Family History  Problem  Relation Age of Onset   Suicidality Father    Depression Father    Alcohol abuse Father    Emphysema Father        smoked   Drug abuse Brother    Alcohol abuse Brother    Esophageal cancer Brother    Depression Maternal Aunt    Alcohol abuse Brother    Anxiety disorder Daughter  Depression Daughter    Suicidality Daughter    Alcohol abuse Daughter    Drug abuse Son    Alcohol abuse Son    Emphysema Sister        smoked    Past Surgical History:  Procedure Laterality Date   ANTERIOR CERVICAL DECOMP/DISCECTOMY FUSION N/A 05/25/2016   Procedure: Cervical five-six Anterior cervical decompression/diskectomy/fusion;  Surgeon: Coletta Memos, MD;  [facility];  Service: Neurosurgery;  Laterality: N/A;   APPENDECTOMY     CARPAL TUNNEL RELEASE     CARPAL TUNNEL RELEASE Right 08/02/2017   Procedure: CARPAL TUNNEL RELEASE RIGHT;  Surgeon: Coletta Memos, MD;  Location: [facility];  Service: Neurosurgery;  Laterality: Right;  CARPAL TUNNEL RELEASE RIGHT   CARPAL TUNNEL RELEASE Left 11/29/2017   Procedure: CARPAL TUNNEL RELEASE LEFT;  Surgeon: Coletta Memos, MD;  [facility];  Service: Neurosurgery;  Laterality: Left;  CARPAL TUNNEL RELEASE LEFT   COLONOSCOPY WITH PROPOFOL N/A 11/29/2016   Procedure: COLONOSCOPY WITH PROPOFOL;  Surgeon: Rachael Fee, MD;  [facility];  Service: Endoscopy;  Laterality: N/A;   DILATATION & CURETTAGE/HYSTEROSCOPY WITH MYOSURE  09/11/2023   Procedure: DILATATION & CURETTAGE/HYSTEROSCOPY WITH MYOSURE;  Surgeon: [name], MD;  [facility]: [facility];  Service: Gynecology;;   ESOPHAGOGASTRODUODENOSCOPY (EGD) WITH PROPOFOL N/A 11/29/2016   Procedure: ESOPHAGOGASTRODUODENOSCOPY (EGD) WITH PROPOFOL;  Surgeon: Rachael Fee, MD;  [facility];  Service: Endoscopy;  Laterality: N/A;   IR INJECT/THERA/INC NEEDLE/CATH/PLC EPI/CERV/THOR W/IMG  03/04/2020   LEG TENDON SURGERY     NASAL FRACTURE SURGERY     OPERATIVE ULTRASOUND N/A  09/11/2023   Procedure: OPERATIVE ULTRASOUND;  Surgeon: [name], MD;  Location: [facility];  Service: Gynecology;  Laterality: N/A;   OVARIAN CYST REMOVAL     REPLACEMENT TOTAL KNEE BILATERAL  10/29/2004   rotator cuff surgery Right    Social History   Occupational History   Occupation: Disabled    Employer: [employer]  Tobacco Use   Smoking status: Former    Current packs/day: 0.00    Average packs/day: 1 pack/day for 31.0 years (31.0 ttl pk-yrs)    Types: Cigarettes    Start date: 11/17/1968    Quit date: 11/18/1999    Years since quitting: 24.1   Smokeless tobacco: Never  Vaping Use   Vaping status: Never Used  Substance and Sexual Activity   Alcohol use: Not Currently    Comment: rare   Drug use: No   Sexual activity: Never

## 2024-01-28 DIAGNOSIS — R3914 Feeling of incomplete bladder emptying: Secondary | ICD-10-CM | POA: Diagnosis not present

## 2024-01-28 DIAGNOSIS — N3021 Other chronic cystitis with hematuria: Secondary | ICD-10-CM | POA: Diagnosis not present

## 2024-01-29 ENCOUNTER — Telehealth: Payer: Self-pay

## 2024-01-29 NOTE — Telephone Encounter (Signed)
Unable to reach pt. LM with call back #

## 2024-01-29 NOTE — Telephone Encounter (Signed)
 Dr. Leonides Schanz, On the last OV notation per the PA on 08/14/2019, it was noted,   Msk:  Symmetrical without gross deformities. Unable to use legs, wheelchair dependent   Per protocol, we are a NO lift facility, does this patient need to have an OV or can she be a direct (overdue recall) at the hospital due to this information? Please advise Bre, PV RN

## 2024-02-03 DIAGNOSIS — E118 Type 2 diabetes mellitus with unspecified complications: Secondary | ICD-10-CM | POA: Diagnosis not present

## 2024-02-03 DIAGNOSIS — Z23 Encounter for immunization: Secondary | ICD-10-CM | POA: Diagnosis not present

## 2024-02-03 DIAGNOSIS — E782 Mixed hyperlipidemia: Secondary | ICD-10-CM | POA: Diagnosis not present

## 2024-02-03 DIAGNOSIS — R404 Transient alteration of awareness: Secondary | ICD-10-CM | POA: Diagnosis not present

## 2024-02-03 DIAGNOSIS — R609 Edema, unspecified: Secondary | ICD-10-CM | POA: Diagnosis not present

## 2024-02-03 DIAGNOSIS — G809 Cerebral palsy, unspecified: Secondary | ICD-10-CM | POA: Diagnosis not present

## 2024-02-03 DIAGNOSIS — M810 Age-related osteoporosis without current pathological fracture: Secondary | ICD-10-CM | POA: Diagnosis not present

## 2024-02-03 DIAGNOSIS — K760 Fatty (change of) liver, not elsewhere classified: Secondary | ICD-10-CM | POA: Diagnosis not present

## 2024-02-03 DIAGNOSIS — N319 Neuromuscular dysfunction of bladder, unspecified: Secondary | ICD-10-CM | POA: Diagnosis not present

## 2024-02-03 DIAGNOSIS — G629 Polyneuropathy, unspecified: Secondary | ICD-10-CM | POA: Diagnosis not present

## 2024-02-03 DIAGNOSIS — R262 Difficulty in walking, not elsewhere classified: Secondary | ICD-10-CM | POA: Diagnosis not present

## 2024-02-03 DIAGNOSIS — K219 Gastro-esophageal reflux disease without esophagitis: Secondary | ICD-10-CM | POA: Diagnosis not present

## 2024-02-04 ENCOUNTER — Other Ambulatory Visit: Payer: Self-pay | Admitting: Physician Assistant

## 2024-02-04 DIAGNOSIS — M810 Age-related osteoporosis without current pathological fracture: Secondary | ICD-10-CM

## 2024-02-11 ENCOUNTER — Telehealth: Payer: Self-pay | Admitting: Family

## 2024-02-11 DIAGNOSIS — Z7409 Other reduced mobility: Secondary | ICD-10-CM

## 2024-02-11 NOTE — Telephone Encounter (Signed)
 Patient called. She would like a RX for an Art gallery manager. Her cb# [phone]

## 2024-02-12 ENCOUNTER — Telehealth: Payer: Self-pay | Admitting: *Deleted

## 2024-02-12 ENCOUNTER — Encounter

## 2024-02-12 NOTE — Telephone Encounter (Signed)
 Pt caled to set up OV with Dr. Rosaline Coma per MD request. Pt denies any pressing issue with the date available for OV. Pt scheduled for 05/06/24 @ 10:50 am. Instructed pt to come 15 min prior to OV. Pt stated she understood and had no other questions.

## 2024-02-17 NOTE — Telephone Encounter (Signed)
 You saw this pt last month and she is requesting a rx for motorized wheelchair. Do you want to write this or refer to PT for eval of need.

## 2024-02-18 DIAGNOSIS — G809 Cerebral palsy, unspecified: Secondary | ICD-10-CM | POA: Diagnosis not present

## 2024-02-19 NOTE — Addendum Note (Signed)
 Addended by: Kriti Katayama R on: 02/19/2024 03:26 PM   Modules accepted: Orders

## 2024-02-26 ENCOUNTER — Encounter: Admitting: Internal Medicine

## 2024-02-27 DIAGNOSIS — M4802 Spinal stenosis, cervical region: Secondary | ICD-10-CM | POA: Diagnosis not present

## 2024-02-27 DIAGNOSIS — G43109 Migraine with aura, not intractable, without status migrainosus: Secondary | ICD-10-CM | POA: Diagnosis not present

## 2024-02-27 DIAGNOSIS — G3184 Mild cognitive impairment, so stated: Secondary | ICD-10-CM | POA: Diagnosis not present

## 2024-02-27 DIAGNOSIS — G4733 Obstructive sleep apnea (adult) (pediatric): Secondary | ICD-10-CM | POA: Diagnosis not present

## 2024-02-27 DIAGNOSIS — N3021 Other chronic cystitis with hematuria: Secondary | ICD-10-CM | POA: Diagnosis not present

## 2024-02-27 DIAGNOSIS — R569 Unspecified convulsions: Secondary | ICD-10-CM | POA: Diagnosis not present

## 2024-02-27 DIAGNOSIS — R3914 Feeling of incomplete bladder emptying: Secondary | ICD-10-CM | POA: Diagnosis not present

## 2024-02-27 DIAGNOSIS — G4761 Periodic limb movement disorder: Secondary | ICD-10-CM | POA: Diagnosis not present

## 2024-03-04 ENCOUNTER — Other Ambulatory Visit (HOSPITAL_COMMUNITY): Payer: Self-pay | Admitting: Psychiatry

## 2024-03-04 DIAGNOSIS — R413 Other amnesia: Secondary | ICD-10-CM

## 2024-03-04 DIAGNOSIS — F419 Anxiety disorder, unspecified: Secondary | ICD-10-CM

## 2024-03-04 DIAGNOSIS — F319 Bipolar disorder, unspecified: Secondary | ICD-10-CM

## 2024-03-10 ENCOUNTER — Other Ambulatory Visit: Payer: Self-pay

## 2024-03-10 ENCOUNTER — Ambulatory Visit (HOSPITAL_COMMUNITY)
Admission: EM | Admit: 2024-03-10 | Discharge: 2024-03-10 | Disposition: A | Discharge status: Home / Self Care | Attending: Family Medicine | Admitting: Family Medicine

## 2024-03-10 ENCOUNTER — Encounter (HOSPITAL_COMMUNITY): Payer: Self-pay | Admitting: *Deleted

## 2024-03-10 DIAGNOSIS — R110 Nausea: Secondary | ICD-10-CM | POA: Diagnosis not present

## 2024-03-10 DIAGNOSIS — N390 Urinary tract infection, site not specified: Secondary | ICD-10-CM | POA: Diagnosis not present

## 2024-03-10 DIAGNOSIS — T83511A Infection and inflammatory reaction due to indwelling urethral catheter, initial encounter: Secondary | ICD-10-CM | POA: Insufficient documentation

## 2024-03-10 LAB — POCT URINALYSIS DIP (MANUAL ENTRY)
Bilirubin, UA: NEGATIVE
Glucose, UA: NEGATIVE mg/dL
Ketones, POC UA: NEGATIVE mg/dL
Nitrite, UA: POSITIVE — AB
Protein Ur, POC: NEGATIVE mg/dL
Spec Grav, UA: 1.015 (ref 1.010–1.025)
Urobilinogen, UA: 0.2 U/dL
pH, UA: 7 (ref 5.0–8.0)

## 2024-03-10 MED ORDER — ONDANSETRON 4 MG PO TBDP: 4.0000 mg | ORAL_TABLET | Freq: Three times a day (TID) | ORAL | 0 refills | Status: AC | PRN

## 2024-03-10 MED ORDER — CEPHALEXIN 500 MG PO CAPS: 500.0000 mg | ORAL_CAPSULE | Freq: Two times a day (BID) | ORAL | 0 refills | Status: AC

## 2024-03-10 NOTE — ED Triage Notes (Signed)
 PT DOB and full name confirmed .

## 2024-03-10 NOTE — Discharge Instructions (Addendum)
 Your urine was indicative of a UTI.  An antibiotic has been prescribed. I have sent Zofran  to the pharmacy to help alleviate your nausea. Please follow up next week with your PCP for re-evaluation. Ensure you are drinking plenty of fluids.

## 2024-03-10 NOTE — ED Provider Notes (Signed)
 Ordered for her [facility]    CSN: [id number] Arrival date & time: 03/10/24  1612      History   Chief Complaint Chief Complaint  Patient presents with   Nausea    HPI [name] is a 68 y.o. female.   Patient presents requesting evaluation for ongoing nausea for the past five days.  States it has been intermittent.  No other symptoms such as fever, chest pain, shortness of breath, cough, headache, runny nose, or sore throat.  More specifically, she denies any abdominal pain, back pain, abnormal vaginal discharge or bleeding.  She has been able to eat/drink without difficulty.  Has tried PeptoBismol for the symptoms without any improvement.  Last normal bowel movement was Sunday.  No other medications have been taken.  The history is provided by the patient.    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HCC)    Anxiety    Arthritis    Benign paroxysmal positional vertigo 02/24/2013   Bipolar disorder (HCC)    Cancer (HCC)    skin cancer   Cerebral palsy (HCC)    Colitis    Depression    bi polar   Diabetes mellitus, type II (HCC)    Dysplasia of cervix    GERD (gastroesophageal reflux disease)    Headache(784.0)    High cholesterol    History of chicken pox    History of measles    History of mumps    IBS (irritable bowel syndrome)    Low potassium syndrome    Melanoma in situ (HCC)    Neuromuscular disorder (HCC)    cerebral palsy   Ovarian cyst    Peripheral neuropathy    Seizures (HCC)    Stenosis, cervical spine 02/2015   Trichomonas     Patient Active Problem List   Diagnosis Date Noted   Bilateral shoulder pain 08/09/2022   Neck pain 11/21/2018   Low back pain 08/19/2018   Weakness of both lower extremities 08/19/2018   Pain in finger of left hand 05/28/2018   Pain in right hand 05/28/2018   Pain in left elbow 04/02/2018   Chronic right shoulder pain 07/16/2017   Rotator cuff syndrome, right 05/13/2017   Cervical  radiculopathy 01/14/2017   Colon cancer screening    Encounter for screening for gastric cancer    Osteoarthritis of spine with radiculopathy, cervical region 05/25/2016   Postmenopausal bleeding 08/04/2015   Acute respiratory failure (HCC) 07/19/2013   Nocturnal hypoxemia 07/07/2013   Lower urinary tract infectious disease 07/07/2013   Hypokalemia 07/07/2013   Abdominal pain, acute, right lower quadrant 07/07/2013   Benign paroxysmal positional vertigo 02/24/2013   Edema of both legs 02/24/2013   Dysplasia of cervix    Melanoma in situ [facility])    Bipolar 1 disorder (HCC) 02/08/2012   IBS 12/04/2007   RASH AND OTHER NONSPECIFIC SKIN ERUPTION 12/04/2007   EMPHYSEMA by CT only 09/05/2007   NECK MASS 09/05/2007   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HCC) 08/13/2007   Cerebral palsy (HCC) 08/13/2007   OTITIS EXTERNA, ACUTE 08/13/2007   IRRITABLE BOWEL SYNDROME, HX OF 08/13/2007   HYPERLIPIDEMIA 05/10/2007   DEPRESSION 05/10/2007   ALLERGIC RHINITIS 05/10/2007    Past Surgical History:  Procedure Laterality Date   ANTERIOR CERVICAL DECOMP/DISCECTOMY FUSION N/A 05/25/2016   Procedure: Cervical five-six Anterior cervical decompression/diskectomy/fusion;  Surgeon: Audie Bleacher, MD;  [facility]: [facility];  Service: Neurosurgery;  Laterality: N/A;   APPENDECTOMY     CARPAL  TUNNEL RELEASE     CARPAL TUNNEL RELEASE Right 08/02/2017   Procedure: CARPAL TUNNEL RELEASE RIGHT;  Surgeon: Audie Bleacher, MD;  Location: [facility] OR;  Service: Neurosurgery;  Laterality: Right;  CARPAL TUNNEL RELEASE RIGHT   CARPAL TUNNEL RELEASE Left 11/29/2017   Procedure: CARPAL TUNNEL RELEASE LEFT;  Surgeon: Audie Bleacher, MD;  Location: [facility] OR;  Service: Neurosurgery;  Laterality: Left;  CARPAL TUNNEL RELEASE LEFT   COLONOSCOPY WITH PROPOFOL  N/A 11/29/2016   Procedure: COLONOSCOPY WITH PROPOFOL ;  Surgeon: Janel Medford, MD;  Location: [facility];  Service: Endoscopy;  Laterality: N/A;   DILATATION & CURETTAGE/HYSTEROSCOPY WITH MYOSURE   09/11/2023   Procedure: DILATATION & CURETTAGE/HYSTEROSCOPY WITH MYOSURE;  Surgeon: Kiki Pelton, MD;  Location: [facility] OR;  Service: Gynecology;;   ESOPHAGOGASTRODUODENOSCOPY (EGD) WITH PROPOFOL  N/A 11/29/2016   Procedure: ESOPHAGOGASTRODUODENOSCOPY (EGD) WITH PROPOFOL ;  Surgeon: Janel Medford, MD;  [facility]: [facility];  Service: Endoscopy;  Laterality: N/A;   IR INJECT/THERA/INC NEEDLE/CATH/PLC EPI/CERV/THOR W/IMG  03/04/2020   LEG TENDON SURGERY     NASAL FRACTURE SURGERY     OPERATIVE ULTRASOUND N/A 09/11/2023   Procedure: OPERATIVE ULTRASOUND;  Surgeon: Kiki Pelton, MD;  [facility];  Service: Gynecology;  Laterality: N/A;   OVARIAN CYST REMOVAL     REPLACEMENT TOTAL KNEE BILATERAL  10/29/2004   rotator cuff surgery Right     OB History     Gravida  3   Para  2   Term  2   Preterm      AB  1   Living  2      SAB      IAB      Ectopic      Multiple      Live Births               Home Medications    Prior to Admission medications   Medication Sig Start Date End Date Taking? Authorizing Provider  albuterol  (PROVENTIL  HFA;VENTOLIN  HFA) 108 (90 Base) MCG/ACT inhaler Inhale 1-2 puffs into the lungs every 6 (six) hours as needed for wheezing or shortness of breath.   Yes [provider]  amitriptyline  (ELAVIL ) 50 MG tablet Take 1 tablet (50 mg total) by mouth at bedtime. 01/02/24  Yes Arfeen, Bronson Canny, MD  AQUALANCE LANCETS 30G MISC  04/25/18  Yes [provider]  Ascorbic Acid  (VITAMIN C ) 500 MG CAPS Take 500 mg by mouth at bedtime.    Yes [provider]  atorvastatin  (LIPITOR) 40 MG tablet 1 tablet   Yes [provider]  B Complex Vitamins (B COMPLEX 1 PO) Take by mouth. 03/12/14  Yes [provider]  Blood Glucose Monitoring Suppl (ONE TOUCH ULTRA 2) w/Device KIT  05/05/18  Yes [provider]  cephALEXin  (KEFLEX ) 500 MG capsule Take 1 capsule (500 mg total) by mouth 2 (two) times daily for 7  days. 03/10/24 03/17/24 Yes Genene Kennel, FNP  fluticasone  (FLONASE ) 50 MCG/ACT nasal spray 2 sprays by Each Nare route daily.   Yes [provider]  gabapentin  (NEURONTIN ) 600 MG tablet Take 100 mg by mouth 2 (two) times daily. 02/20/23  Yes Hamrick, Maura L, MD  glipiZIDE (GLUCOTROL XL) 10 MG 24 hr tablet Take 5 mg by mouth daily. 04/08/19  Yes [provider]  lamoTRIgine  (LAMICTAL ) 200 MG tablet Take 1 tablet (200 mg total) by mouth daily. 01/02/24  Yes Arfeen, Bronson Canny, MD  Lancet Devices (ADJUSTABLE LANCING DEVICE) MISC  05/05/18  Yes [provider]  metFORMIN  (GLUCOPHAGE -XR) 500 MG 24 hr tablet Take 500 mg by mouth daily with breakfast. IN THE MORNING.   Yes [provider]  Multiple Vitamins-Minerals (WOMENS 50+ MULTI VITAMIN PO) Take by mouth.   Yes [provider]  ondansetron  (ZOFRAN -ODT) 4 MG disintegrating tablet Take 1 tablet (4 mg total) by mouth every 8 (eight) hours as needed for nausea or vomiting. 03/10/24  Yes Genene Kennel, FNP  ONE TOUCH ULTRA TEST test strip  05/05/18  Yes [provider]  Polyethyl Glycol-Propyl Glycol 0.4-0.3 % SOLN Place 1-2 drops into both eyes 3 (three) times daily as needed (for dry eyes.).   Yes [provider]  potassium chloride  (KLOR-CON ) 10 MEQ tablet Take 10 mEq by mouth daily. 05/16/21  Yes [provider]  Propylene Glycol-Glycerin 1-0.3 % SOLN Apply 1 drop to eye in the morning and at bedtime.   Yes [provider]  risedronate (ACTONEL) 150 MG tablet Take 150 mg by mouth every 30 (thirty) days. 06/09/20  Yes [provider]  magnesium  30 MG tablet Take 30 mg by mouth daily.    [provider]  methocarbamol  (ROBAXIN ) 500 MG tablet Take 1 tablet (500 mg total) by mouth 3 (three) times daily. 01/22/24   Zamora, Erin R, NP  misoprostol  (CYTOTEC ) 200 MCG tablet Place two tablets in the vagina the night prior to your procedure 08/02/23   Ajewole, Christana, MD   Niacin-Polypodium Leucotomos (HELIOCARE ADVANCED) 250-120 MG CAPS Take by mouth daily.    [provider]  orlistat (XENICAL) 120 MG capsule Take by mouth.    [provider]  pilocarpine (SALAGEN) 5 MG tablet Take by mouth.    [provider]  spironolactone  (ALDACTONE ) 100 MG tablet Take 100 mg by mouth daily. 01/28/15   [provider]    Family History Family History  Problem Relation Age of Onset   Suicidality Father    Depression Father    Alcohol abuse Father    Emphysema Father        smoked   Drug abuse Brother    Alcohol abuse Brother    Esophageal cancer Brother    Depression Maternal Aunt    Alcohol abuse Brother    Anxiety disorder Daughter    Depression Daughter    Suicidality Daughter    Alcohol abuse Daughter    Drug abuse Son    Alcohol abuse Son    Emphysema Sister        smoked    Social History Social History   Tobacco Use   Smoking status: Former    Current packs/day: 0.00    Average packs/day: 1 pack/day for 31.0 years (31.0 ttl pk-yrs)    Types: Cigarettes    Start date: 11/17/1968    Quit date: 11/18/1999    Years since quitting: 24.3   Smokeless tobacco: Never  Vaping Use   Vaping status: Never Used  Substance Use Topics   Alcohol use: Not Currently    Comment: rare   Drug use: No     Allergies   Miconazole, Miconazole nitrate, and Sulfonamide derivatives   Review of Systems Review of Systems  Constitutional:  Negative for appetite change, fatigue and fever.  HENT:  Negative for congestion, rhinorrhea and sore throat.   Respiratory:  Negative for cough and shortness of breath.   Cardiovascular:  Negative for chest pain and palpitations.  Gastrointestinal:  Positive for nausea. Negative for abdominal pain, constipation, diarrhea  and vomiting.  Genitourinary:  Negative for dysuria, flank pain, pelvic pain, vaginal bleeding and vaginal discharge.  Musculoskeletal:  Negative for back pain and  myalgias.  Skin:  Negative for rash.  Neurological:  Negative for dizziness and headaches.     Physical Exam Triage Vital Signs ED Triage Vitals  Encounter Vitals Group     BP 03/10/24 1725 127/76     Systolic BP Percentile --      Diastolic BP Percentile --      Pulse Rate 03/10/24 1725 72     Resp 03/10/24 1725 20     Temp 03/10/24 1725 98.6 F (37 C)     Temp src --      SpO2 03/10/24 1725 94 %     Weight --      Height --      Head Circumference --      Peak Flow --      Pain Score 03/10/24 1718 0     Pain Loc --      Pain Education --      Exclude from Growth Chart --    No data found.  Updated Vital Signs BP 127/76   Pulse 72   Temp 98.6 F (37 C)   Resp 20   SpO2 94%   Physical Exam Vitals and nursing note reviewed.  Constitutional:      Comments: Seated in mechanical wheelchair.    Cardiovascular:     Rate and Rhythm: Normal rate and regular rhythm.     Heart sounds: Normal heart sounds.  Pulmonary:     Effort: Pulmonary effort is normal.     Breath sounds: Normal breath sounds.  Abdominal:     General: Bowel sounds are normal.     Palpations: Abdomen is soft.     Tenderness: There is no abdominal tenderness. There is no right CVA tenderness, left CVA tenderness or guarding.  Skin:    General: Skin is warm and dry.  Neurological:     General: No focal deficit present.     Mental Status: She is alert and oriented to person, place, and time.  Psychiatric:        Mood and Affect: Mood normal.        Behavior: Behavior normal.        Thought Content: Thought content normal.        Judgment: Judgment normal.    UC Treatments / Results  Labs (all labs ordered are listed, but only abnormal results are displayed) Labs Reviewed  POCT URINALYSIS DIP (MANUAL ENTRY) - Abnormal; Notable for the following components:      Result Value   Blood, UA trace-lysed (*)    Nitrite, UA Positive (*)    Leukocytes, UA Small (1+) (*)    All other components  within normal limits  URINE CULTURE    EKG   Radiology No results found.  Procedures Procedures (including critical care time)  Medications Ordered in UC Medications - No data to display  Initial Impression / Assessment and Plan / UC Course  I have reviewed the triage vital signs and the nursing notes.  Pertinent labs & imaging results that were available during my care of the patient were reviewed by me and considered in my medical decision making (see chart for details).     Patient presents for nausea that has been ongoing for the past five days. She is otherwise asymptomatic.  POCT UA indicates a likely UTI - trace blood,  positive nitrites, and small leukocytes.  Urine culture has been requested.  Will start patient on cephalexin  pending urine culture results.  Discussed increased oral hydration.  Prescription for Zofran  sent to the pharmacy to help with nausea.  Patient states she self instills an indwelling catheter every two weeks - last placed over a week ago.  She will change the catheter out in 2 days.  I've requested that she follow up with her PCP within one week for re-evaluation.  Final Clinical Impressions(s) / UC Diagnoses   Final diagnoses:  Nausea  Urinary tract infection associated with indwelling urethral catheter, initial encounter [facility])     Discharge Instructions      Your urine was indicative of a UTI.  An antibiotic has been prescribed. I have sent Zofran  to the pharmacy to help alleviate your nausea. Please follow up next week with your PCP for re-evaluation. Ensure you are drinking plenty of fluids.   ED Prescriptions     Medication Sig Dispense Auth. Provider   cephALEXin  (KEFLEX ) 500 MG capsule Take 1 capsule (500 mg total) by mouth 2 (two) times daily for 7 days. 14 capsule Genene Kennel, FNP   ondansetron  (ZOFRAN -ODT) 4 MG disintegrating tablet Take 1 tablet (4 mg total) by mouth every 8 (eight) hours as needed for nausea or vomiting. 20  tablet Genene Kennel, FNP      PDMP not reviewed this encounter.   Genene Kennel, FNP 03/10/24 1911

## 2024-03-10 NOTE — ED Triage Notes (Signed)
 PT reports she has nausea since last week. Pt has an indweling catheter and is concerned with UTI

## 2024-03-13 ENCOUNTER — Ambulatory Visit (HOSPITAL_COMMUNITY): Payer: Self-pay

## 2024-03-13 LAB — URINE CULTURE: Culture: >=100000 — AB

## 2024-03-16 ENCOUNTER — Telehealth: Payer: Self-pay | Admitting: Orthopedic Surgery

## 2024-03-16 NOTE — Telephone Encounter (Signed)
 Pt request a call back regarding the procedure she states Erin told her she would have done by Dr. Julio Ohm for her shoulder

## 2024-03-16 NOTE — Telephone Encounter (Signed)
 You discussed possible surgery for the pt's shoulder at her visit in March. Per the note it said would disucss with Dr. Julio Ohm. Pt calling to see what her options are.

## 2024-03-19 DIAGNOSIS — G809 Cerebral palsy, unspecified: Secondary | ICD-10-CM | POA: Diagnosis not present

## 2024-03-24 ENCOUNTER — Ambulatory Visit (INDEPENDENT_AMBULATORY_CARE_PROVIDER_SITE_OTHER): Admitting: Orthopedic Surgery

## 2024-03-24 DIAGNOSIS — M1812 Unilateral primary osteoarthritis of first carpometacarpal joint, left hand: Secondary | ICD-10-CM | POA: Diagnosis not present

## 2024-03-24 DIAGNOSIS — M7541 Impingement syndrome of right shoulder: Secondary | ICD-10-CM | POA: Diagnosis not present

## 2024-03-25 ENCOUNTER — Telehealth: Payer: Self-pay | Admitting: Orthopedic Surgery

## 2024-03-25 ENCOUNTER — Encounter: Payer: Self-pay | Admitting: Orthopedic Surgery

## 2024-03-25 DIAGNOSIS — M7541 Impingement syndrome of right shoulder: Secondary | ICD-10-CM | POA: Diagnosis not present

## 2024-03-25 MED ORDER — METHYLPREDNISOLONE ACETATE 40 MG/ML IJ SUSP
40.0000 mg | INTRAMUSCULAR | Status: AC | PRN
  Administered 2024-03-25: 40 mg via INTRA_ARTICULAR

## 2024-03-25 MED ORDER — LIDOCAINE HCL 1 % IJ SOLN
5.0000 mL | INTRAMUSCULAR | Status: AC | PRN
  Administered 2024-03-25: 5 mL

## 2024-03-25 NOTE — Telephone Encounter (Signed)
 Patient called and said she would like to request a scooter. [phone]

## 2024-03-25 NOTE — Progress Notes (Signed)
 Office Visit Note   Patient: [name]           Date of Birth: [birth year]           MRN: [id number] Visit Date: 03/24/2024              Requested by: Aloha Arnold, PA-C [address] [place],  [place] [address] PCP: Aloha Arnold, PA-C  Chief Complaint  Patient presents with   Right Shoulder - Follow-up      HPI: Patient is a 68 year old woman who is seen for 2 separate issues.  Patient complains of pain in the base of the left thumb.  Patient also has had recurrent pain in the right shoulder.  Assessment & Plan: Visit Diagnoses:  1. Arthritis of carpometacarpal (CMC) joint of left thumb   2. Impingement syndrome of right shoulder     Plan: Right shoulder was injected in the subacromial space.  Recommended Voltaren gel for the thumb arthritis.  Follow-Up Instructions: Return if symptoms worsen or fail to improve.   Ortho Exam  Patient is alert, oriented, no adenopathy, well-dressed, normal affect, normal respiratory effort. Examination patient ambulates in a powered scooter.  Examination of the left hand she is pain at the base of the left thumb the carpometacarpal joint.  There is no redness no cellulitis no ulcer.  Examination of the right shoulder patient has tenderness to palpation of the biceps tendon and has pain with Neer and Hawkins impingement test.  Imaging: No results found. No images are attached to the encounter.  Labs: Lab Results  Component Value Date   HGBA1C 6.0 (H) 05/30/2023   HGBA1C 7.2 (H) 11/29/2017   HGBA1C 6.6 (H) 07/30/2017   REPTSTATUS 03/13/2024 FINAL 03/10/2024   CULT (A) 03/10/2024    >=100,000 COLONIES/mL ESCHERICHIA COLI >=100,000 COLONIES/mL KLEBSIELLA PNEUMONIAE    LABORGA ESCHERICHIA COLI (A) 03/10/2024   LABORGA KLEBSIELLA PNEUMONIAE (A) 03/10/2024     Lab Results  Component Value Date   ALBUMIN 4.5 12/15/2015   ALBUMIN 3.9 11/18/2013   ALBUMIN 3.9 07/07/2013    Lab Results  Component Value Date   MG  1.9 07/08/2013   No results found for: "VD25OH"  No results found for: "PREALBUMIN"    Latest Ref Rng & Units 09/11/2023   10:33 AM 07/25/2023    5:08 PM 05/16/2023    6:09 PM  CBC EXTENDED  WBC 4.0 - 10.5 K/uL 15.9  11.8  14.0   RBC 3.87 - 5.11 MIL/uL 5.31  5.00  5.33   Hemoglobin 12.0 - 15.0 g/dL 62.1  30.8  65.7   HCT 36.0 - 46.0 % 46.8  45.7  46.7   Platelets 150 - 400 K/uL 416  381  414      There is no height or weight on file to calculate BMI.  Orders:  No orders of the defined types were placed in this encounter.  No orders of the defined types were placed in this encounter.    Procedures: Large Joint Inj: R subacromial bursa on 03/25/2024 4:44 PM Indications: diagnostic evaluation and pain Details: 22 G 1.5 in needle, posterior approach  Arthrogram: No  Medications: 5 mL lidocaine  1 %; 40 mg methylPREDNISolone  acetate 40 MG/ML Outcome: tolerated well, no immediate complications Procedure, treatment alternatives, risks and benefits explained, specific risks discussed. Consent was given by the patient. Immediately prior to procedure a time out was called to verify the correct patient, procedure, equipment, support staff and site/side marked as required.  Patient was prepped and draped in the usual sterile fashion.      Clinical Data: No additional findings.  ROS:  All other systems negative, except as noted in the HPI. Review of Systems  Objective: Vital Signs: There were no vitals taken for this visit.  Specialty Comments:  No specialty comments available.  PMFS History: Patient Active Problem List   Diagnosis Date Noted   Bilateral shoulder pain 08/09/2022   Neck pain 11/21/2018   Low back pain 08/19/2018   Weakness of both lower extremities 08/19/2018   Pain in finger of left hand 05/28/2018   Pain in right hand 05/28/2018   Pain in left elbow 04/02/2018   Chronic right shoulder pain 07/16/2017   Rotator cuff syndrome, right 05/13/2017    Cervical radiculopathy 01/14/2017   Colon cancer screening    Encounter for screening for gastric cancer    Osteoarthritis of spine with radiculopathy, cervical region 05/25/2016   Postmenopausal bleeding 08/04/2015   Acute respiratory failure (HCC) 07/19/2013   Nocturnal hypoxemia 07/07/2013   Lower urinary tract infectious disease 07/07/2013   Hypokalemia 07/07/2013   Abdominal pain, acute, right lower quadrant 07/07/2013   Benign paroxysmal positional vertigo 02/24/2013   Edema of both legs 02/24/2013   Dysplasia of cervix    Melanoma in situ [facility])    Bipolar 1 disorder (HCC) 02/08/2012   IBS 12/04/2007   RASH AND OTHER NONSPECIFIC SKIN ERUPTION 12/04/2007   EMPHYSEMA by CT only 09/05/2007   NECK MASS 09/05/2007   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HCC) 08/13/2007   Cerebral palsy (HCC) 08/13/2007   OTITIS EXTERNA, ACUTE 08/13/2007   IRRITABLE BOWEL SYNDROME, HX OF 08/13/2007   HYPERLIPIDEMIA 05/10/2007   DEPRESSION 05/10/2007   ALLERGIC RHINITIS 05/10/2007   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HCC)    Anxiety    Arthritis    Benign paroxysmal positional vertigo 02/24/2013   Bipolar disorder (HCC)    Cancer (HCC)    skin cancer   Cerebral palsy (HCC)    Colitis    Depression    bi polar   Diabetes mellitus, type II (HCC)    Dysplasia of cervix    GERD (gastroesophageal reflux disease)    Headache(784.0)    High cholesterol    History of chicken pox    History of measles    History of mumps    IBS (irritable bowel syndrome)    Low potassium syndrome    Melanoma in situ (HCC)    Neuromuscular disorder (HCC)    cerebral palsy   Ovarian cyst    Peripheral neuropathy    Seizures (HCC)    Stenosis, cervical spine 02/2015   Trichomonas     Family History  Problem Relation Age of Onset   Suicidality Father    Depression Father    Alcohol abuse Father    Emphysema Father        smoked   Drug abuse Brother    Alcohol abuse Brother    Esophageal cancer Brother     Depression Maternal Aunt    Alcohol abuse Brother    Anxiety disorder Daughter    Depression Daughter    Suicidality Daughter    Alcohol abuse Daughter    Drug abuse Son    Alcohol abuse Son    Emphysema Sister        smoked    Past Surgical History:  Procedure Laterality Date   ANTERIOR CERVICAL DECOMP/DISCECTOMY FUSION N/A 05/25/2016   Procedure: Cervical five-six  Anterior cervical decompression/diskectomy/fusion;  Surgeon: Audie Bleacher, MD;  [facility];  Service: Neurosurgery;  Laterality: N/A;   APPENDECTOMY     CARPAL TUNNEL RELEASE     CARPAL TUNNEL RELEASE Right 08/02/2017   Procedure: CARPAL TUNNEL RELEASE RIGHT;  Surgeon: Audie Bleacher, MD;  Location: [facility];  Service: Neurosurgery;  Laterality: Right;  CARPAL TUNNEL RELEASE RIGHT   CARPAL TUNNEL RELEASE Left 11/29/2017   Procedure: CARPAL TUNNEL RELEASE LEFT;  Surgeon: Audie Bleacher, MD;  [facility];  Service: Neurosurgery;  Laterality: Left;  CARPAL TUNNEL RELEASE LEFT   COLONOSCOPY WITH PROPOFOL  N/A 11/29/2016   Procedure: COLONOSCOPY WITH PROPOFOL ;  Surgeon: Janel Medford, MD;  [facility]: [facility];  Service: Endoscopy;  Laterality: N/A;   DILATATION & CURETTAGE/HYSTEROSCOPY WITH MYOSURE  09/11/2023   Procedure: DILATATION & CURETTAGE/HYSTEROSCOPY WITH MYOSURE;  Surgeon: Kiki Pelton, MD;  Location: [facility];  Service: Gynecology;;   ESOPHAGOGASTRODUODENOSCOPY (EGD) WITH PROPOFOL  N/A 11/29/2016   Procedure: ESOPHAGOGASTRODUODENOSCOPY (EGD) WITH PROPOFOL ;  Surgeon: Janel Medford, MD;  Location: [facility];  Service: Endoscopy;  Laterality: N/A;   IR INJECT/THERA/INC NEEDLE/CATH/PLC EPI/CERV/THOR W/IMG  03/04/2020   LEG TENDON SURGERY     NASAL FRACTURE SURGERY     OPERATIVE ULTRASOUND N/A 09/11/2023   Procedure: OPERATIVE ULTRASOUND;  Surgeon: Kiki Pelton, MD;  Location: [facility];  Service: Gynecology;  Laterality: N/A;   OVARIAN CYST REMOVAL     REPLACEMENT TOTAL KNEE BILATERAL   10/29/2004   rotator cuff surgery Right    Social History   Occupational History   Occupation: Disabled    Employer: [employer]  Tobacco Use   Smoking status: Former    Current packs/day: 0.00    Average packs/day: 1 pack/day for 31.0 years (31.0 ttl pk-yrs)    Types: Cigarettes    Start date: 11/17/1968    Quit date: 11/18/1999    Years since quitting: 24.3   Smokeless tobacco: Never  Vaping Use   Vaping status: Never Used  Substance and Sexual Activity   Alcohol use: Not Currently    Comment: rare   Drug use: No   Sexual activity: Never

## 2024-03-27 DIAGNOSIS — G801 Spastic diplegic cerebral palsy: Secondary | ICD-10-CM | POA: Diagnosis not present

## 2024-03-27 DIAGNOSIS — G4733 Obstructive sleep apnea (adult) (pediatric): Secondary | ICD-10-CM | POA: Diagnosis not present

## 2024-03-29 DIAGNOSIS — N3021 Other chronic cystitis with hematuria: Secondary | ICD-10-CM | POA: Diagnosis not present

## 2024-03-29 DIAGNOSIS — R3914 Feeling of incomplete bladder emptying: Secondary | ICD-10-CM | POA: Diagnosis not present

## 2024-03-31 DIAGNOSIS — J309 Allergic rhinitis, unspecified: Secondary | ICD-10-CM | POA: Diagnosis not present

## 2024-03-31 DIAGNOSIS — N390 Urinary tract infection, site not specified: Secondary | ICD-10-CM | POA: Diagnosis not present

## 2024-03-31 DIAGNOSIS — N319 Neuromuscular dysfunction of bladder, unspecified: Secondary | ICD-10-CM | POA: Diagnosis not present

## 2024-03-31 DIAGNOSIS — E118 Type 2 diabetes mellitus with unspecified complications: Secondary | ICD-10-CM | POA: Diagnosis not present

## 2024-03-31 DIAGNOSIS — Z978 Presence of other specified devices: Secondary | ICD-10-CM | POA: Diagnosis not present

## 2024-04-01 NOTE — Telephone Encounter (Signed)
 Front desk advised pt would need to come in for a face to face we have never seen her in the office for anything other than her neck, shoulder and thumb

## 2024-04-09 ENCOUNTER — Ambulatory Visit (HOSPITAL_BASED_OUTPATIENT_CLINIC_OR_DEPARTMENT_OTHER): Admitting: Psychiatry

## 2024-04-09 ENCOUNTER — Encounter (HOSPITAL_COMMUNITY): Payer: Self-pay | Admitting: Psychiatry

## 2024-04-09 DIAGNOSIS — N368 Other specified disorders of urethra: Secondary | ICD-10-CM | POA: Diagnosis not present

## 2024-04-09 DIAGNOSIS — R110 Nausea: Secondary | ICD-10-CM | POA: Diagnosis not present

## 2024-04-09 DIAGNOSIS — Z978 Presence of other specified devices: Secondary | ICD-10-CM | POA: Diagnosis not present

## 2024-04-09 DIAGNOSIS — Z8619 Personal history of other infectious and parasitic diseases: Secondary | ICD-10-CM | POA: Diagnosis not present

## 2024-04-09 DIAGNOSIS — F419 Anxiety disorder, unspecified: Secondary | ICD-10-CM

## 2024-04-09 DIAGNOSIS — N390 Urinary tract infection, site not specified: Secondary | ICD-10-CM | POA: Diagnosis not present

## 2024-04-09 DIAGNOSIS — F319 Bipolar disorder, unspecified: Secondary | ICD-10-CM | POA: Diagnosis not present

## 2024-04-09 DIAGNOSIS — R413 Other amnesia: Secondary | ICD-10-CM

## 2024-04-09 MED ORDER — LAMOTRIGINE 200 MG PO TABS: 200.0000 mg | ORAL_TABLET | Freq: Every day | ORAL | 0 refills | Status: DC

## 2024-04-09 MED ORDER — AMITRIPTYLINE HCL 50 MG PO TABS: 50.0000 mg | ORAL_TABLET | Freq: Every day | ORAL | 0 refills | Status: DC
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
## 2024-04-14 DIAGNOSIS — R3914 Feeling of incomplete bladder emptying: Secondary | ICD-10-CM | POA: Diagnosis not present

## 2024-04-14 DIAGNOSIS — N3020 Other chronic cystitis without hematuria: Secondary | ICD-10-CM | POA: Diagnosis not present

## 2024-04-20 ENCOUNTER — Ambulatory Visit: Admitting: Orthopedic Surgery

## 2024-04-20 ENCOUNTER — Telehealth: Payer: Self-pay | Admitting: Internal Medicine

## 2024-04-20 DIAGNOSIS — G4733 Obstructive sleep apnea (adult) (pediatric): Secondary | ICD-10-CM | POA: Diagnosis not present

## 2024-04-20 NOTE — Telephone Encounter (Signed)
 Delon with [facility] clinical lab is calling to find out if we received any fax from them. Requesting a call back at [phone]

## 2024-04-22 NOTE — Telephone Encounter (Signed)
 [name] calling in regards to previous note please advise.

## 2024-04-23 NOTE — Telephone Encounter (Signed)
 Spoke with [name] from [facility] & she stated they faxed over an order request for a GI panel for provider to sign. [name] patient has not been seen with our office in over 5 years & will need OV prior to ordering this stool sample which is in 2 weeks, and that PCP should be contacted in the meantime. She is very adamant that our office needs to review order, so fax number has been provided, however again advised to reach out to PCP.

## 2024-04-27 DIAGNOSIS — G809 Cerebral palsy, unspecified: Secondary | ICD-10-CM | POA: Diagnosis not present

## 2024-04-27 DIAGNOSIS — Z978 Presence of other specified devices: Secondary | ICD-10-CM | POA: Diagnosis not present

## 2024-04-27 DIAGNOSIS — N390 Urinary tract infection, site not specified: Secondary | ICD-10-CM | POA: Diagnosis not present

## 2024-04-28 DIAGNOSIS — N3021 Other chronic cystitis with hematuria: Secondary | ICD-10-CM | POA: Diagnosis not present

## 2024-04-28 DIAGNOSIS — R3914 Feeling of incomplete bladder emptying: Secondary | ICD-10-CM | POA: Diagnosis not present

## 2024-04-30 ENCOUNTER — Ambulatory Visit (INDEPENDENT_AMBULATORY_CARE_PROVIDER_SITE_OTHER): Admitting: Orthopedic Surgery

## 2024-04-30 DIAGNOSIS — M7541 Impingement syndrome of right shoulder: Secondary | ICD-10-CM | POA: Diagnosis not present

## 2024-04-30 DIAGNOSIS — Z7409 Other reduced mobility: Secondary | ICD-10-CM | POA: Diagnosis not present

## 2024-04-30 DIAGNOSIS — Z993 Dependence on wheelchair: Secondary | ICD-10-CM | POA: Diagnosis not present

## 2024-05-05 ENCOUNTER — Encounter: Payer: Self-pay | Admitting: Orthopedic Surgery

## 2024-05-05 NOTE — Progress Notes (Signed)
 Office Visit Note   Patient: [name]           Date of Birth: 1956/05/07           MRN: [id number] Visit Date: 04/30/2024              Requested by: Montey Lot, PA-C [address] [place],  [place] [address] PCP: Montey Lot, PA-C  Chief Complaint  Patient presents with   Right Shoulder - Follow-up    Face to face visit for power chair.      HPI: Patient is a 68 year old woman who is seen for face-to-face evaluation for a power chair.  Patient's last visit she had impingement symptoms of the right shoulder and underwent a steroid injection she states her shoulder is better.  Patient is currently ambulating in a scooter states she has fallen out of the scooter.  Assessment & Plan: Visit Diagnoses:  1. Wheelchair dependent   2. Impingement syndrome of right shoulder   3. Mobility impaired     Plan: Patient does not have sufficient strength in her upper extremities to mobilize with a scooter.  Patient will require a motorized wheelchair for ambulation due to her upper extremity weakness and lower extremity weakness.  Follow-Up Instructions: Return if symptoms worsen or fail to improve.   Ortho Exam  Patient is alert, oriented, no adenopathy, well-dressed, normal affect, normal respiratory effort. Examination patient has pain with Neer and Hawkins impingement tests of both shoulders with weakness of both upper extremities.  Patient is not able to safely use the tiller for her scooter.  She has fallen.  She has an abrasion on the  right elbow from her recent fall.    Imaging: No results found. No images are attached to the encounter.  Labs: Lab Results  Component Value Date   HGBA1C 6.0 (H) 05/30/2023   HGBA1C 7.2 (H) 11/29/2017   HGBA1C 6.6 (H) 07/30/2017   REPTSTATUS 03/13/2024 FINAL 03/10/2024   CULT (A) 03/10/2024    >=100,000 COLONIES/mL ESCHERICHIA COLI >=100,000 COLONIES/mL KLEBSIELLA PNEUMONIAE    LABORGA ESCHERICHIA COLI (A) 03/10/2024    LABORGA KLEBSIELLA PNEUMONIAE (A) 03/10/2024     Lab Results  Component Value Date   ALBUMIN 4.5 12/15/2015   ALBUMIN 3.9 11/18/2013   ALBUMIN 3.9 07/07/2013    Lab Results  Component Value Date   MG 1.9 07/08/2013   No results found for: VD25OH  No results found for: PREALBUMIN    Latest Ref Rng & Units 09/11/2023   10:33 AM 07/25/2023    5:08 PM 05/16/2023    6:09 PM  CBC EXTENDED  WBC 4.0 - 10.5 K/uL 15.9  11.8  14.0   RBC 3.87 - 5.11 MIL/uL 5.31  5.00  5.33   Hemoglobin 12.0 - 15.0 g/dL 84.8  85.2  84.8   HCT 36.0 - 46.0 % 46.8  45.7  46.7   Platelets 150 - 400 K/uL 416  381  414      There is no height or weight on file to calculate BMI.  Orders:  No orders of the defined types were placed in this encounter.  No orders of the defined types were placed in this encounter.    Procedures: No procedures performed  Clinical Data: No additional findings.  ROS:  All other systems negative, except as noted in the HPI. Review of Systems  Objective: Vital Signs: There were no vitals taken for this visit.  Specialty Comments:  No specialty comments available.  PMFS History: Patient Active Problem List   Diagnosis Date Noted   Bilateral shoulder pain 08/09/2022   Neck pain 11/21/2018   Low back pain 08/19/2018   Weakness of both lower extremities 08/19/2018   Pain in finger of left hand 05/28/2018   Pain in right hand 05/28/2018   Pain in left elbow 04/02/2018   Chronic right shoulder pain 07/16/2017   Rotator cuff syndrome, right 05/13/2017   Cervical radiculopathy 01/14/2017   Colon cancer screening    Encounter for screening for gastric cancer    Osteoarthritis of spine with radiculopathy, cervical region 05/25/2016   Postmenopausal bleeding 08/04/2015   Acute respiratory failure (HCC) 07/19/2013   Nocturnal hypoxemia 07/07/2013   Lower urinary tract infectious disease 07/07/2013   Hypokalemia 07/07/2013   Abdominal pain, acute, right  lower quadrant 07/07/2013   Benign paroxysmal positional vertigo 02/24/2013   Edema of both legs 02/24/2013   Dysplasia of cervix    Melanoma in situ [facility])    Bipolar 1 disorder (HCC) 02/08/2012   IBS 12/04/2007   RASH AND OTHER NONSPECIFIC SKIN ERUPTION 12/04/2007   EMPHYSEMA by CT only 09/05/2007   NECK MASS 09/05/2007   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HCC) 08/13/2007   Cerebral palsy (HCC) 08/13/2007   OTITIS EXTERNA, ACUTE 08/13/2007   IRRITABLE BOWEL SYNDROME, HX OF 08/13/2007   HYPERLIPIDEMIA 05/10/2007   DEPRESSION 05/10/2007   ALLERGIC RHINITIS 05/10/2007   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HCC)    Anxiety    Arthritis    Benign paroxysmal positional vertigo 02/24/2013   Bipolar disorder (HCC)    Cancer (HCC)    skin cancer   Cerebral palsy (HCC)    Colitis    Depression    bi polar   Diabetes mellitus, type II (HCC)    Dysplasia of cervix    GERD (gastroesophageal reflux disease)    Headache(784.0)    High cholesterol    History of chicken pox    History of measles    History of mumps    IBS (irritable bowel syndrome)    Low potassium syndrome    Melanoma in situ (HCC)    Neuromuscular disorder (HCC)    cerebral palsy   Ovarian cyst    Peripheral neuropathy    Seizures (HCC)    Stenosis, cervical spine 02/2015   Trichomonas     Family History  Problem Relation Age of Onset   Suicidality Father    Depression Father    Alcohol abuse Father    Emphysema Father        smoked   Drug abuse Brother    Alcohol abuse Brother    Esophageal cancer Brother    Depression Maternal Aunt    Alcohol abuse Brother    Anxiety disorder Daughter    Depression Daughter    Suicidality Daughter    Alcohol abuse Daughter    Drug abuse Son    Alcohol abuse Son    Emphysema Sister        smoked    Past Surgical History:  Procedure Laterality Date   ANTERIOR CERVICAL DECOMP/DISCECTOMY FUSION N/A 05/25/2016   Procedure: Cervical five-six Anterior cervical  decompression/diskectomy/fusion;  Surgeon: Rockey Peru, MD;  Location: [facility];  Service: Neurosurgery;  Laterality: N/A;   APPENDECTOMY     CARPAL TUNNEL RELEASE     CARPAL TUNNEL RELEASE Right 08/02/2017   Procedure: CARPAL TUNNEL RELEASE RIGHT;  Surgeon: Peru Rockey, MD;  Location: [facility];  Service: Neurosurgery;  Laterality: Right;  CARPAL TUNNEL RELEASE RIGHT   CARPAL TUNNEL RELEASE Left 11/29/2017   Procedure: CARPAL TUNNEL RELEASE LEFT;  Surgeon: Gillie Duncans, MD;  Location: [facility];  Service: Neurosurgery;  Laterality: Left;  CARPAL TUNNEL RELEASE LEFT   COLONOSCOPY WITH PROPOFOL  N/A 11/29/2016   Procedure: COLONOSCOPY WITH PROPOFOL ;  Surgeon: Toribio SHAUNNA Cedar, MD;  Location: [facility];  Service: Endoscopy;  Laterality: N/A;   DILATATION & CURETTAGE/HYSTEROSCOPY WITH MYOSURE  09/11/2023   Procedure: DILATATION & CURETTAGE/HYSTEROSCOPY WITH MYOSURE;  Surgeon: Jeralyn Crutch, MD;  Location: [facility];  Service: Gynecology;;   ESOPHAGOGASTRODUODENOSCOPY (EGD) WITH PROPOFOL  N/A 11/29/2016   Procedure: ESOPHAGOGASTRODUODENOSCOPY (EGD) WITH PROPOFOL ;  Surgeon: Toribio SHAUNNA Cedar, MD;  Location: [facility];  Service: Endoscopy;  Laterality: N/A;   IR INJECT/THERA/INC NEEDLE/CATH/PLC EPI/CERV/THOR W/IMG  03/04/2020   LEG TENDON SURGERY     NASAL FRACTURE SURGERY     OPERATIVE ULTRASOUND N/A 09/11/2023   Procedure: OPERATIVE ULTRASOUND;  Surgeon: Jeralyn Crutch, MD;  Location: [facility];  Service: Gynecology;  Laterality: N/A;   OVARIAN CYST REMOVAL     REPLACEMENT TOTAL KNEE BILATERAL  10/29/2004   rotator cuff surgery Right    Social History   Occupational History   Occupation: Disabled    Employer: [employer]  Tobacco Use   Smoking status: Former    Current packs/day: 0.00    Average packs/day: 1 pack/day for 31.0 years (31.0 ttl pk-yrs)    Types: Cigarettes    Start date: 11/17/1968    Quit date: 11/18/1999    Years since quitting: 24.4   Smokeless tobacco: Never  Vaping  Use   Vaping status: Never Used  Substance and Sexual Activity   Alcohol use: Not Currently    Comment: rare   Drug use: No   Sexual activity: Never

## 2024-05-06 ENCOUNTER — Ambulatory Visit (INDEPENDENT_AMBULATORY_CARE_PROVIDER_SITE_OTHER): Admitting: Internal Medicine

## 2024-05-06 ENCOUNTER — Encounter: Payer: Self-pay | Admitting: Internal Medicine

## 2024-05-06 VITALS — BP 122/68 | HR 47 | Ht 67.0 in | Wt 225.0 lb

## 2024-05-06 DIAGNOSIS — K5900 Constipation, unspecified: Secondary | ICD-10-CM | POA: Diagnosis not present

## 2024-05-06 DIAGNOSIS — R262 Difficulty in walking, not elsewhere classified: Secondary | ICD-10-CM | POA: Diagnosis not present

## 2024-05-06 DIAGNOSIS — K219 Gastro-esophageal reflux disease without esophagitis: Secondary | ICD-10-CM | POA: Diagnosis not present

## 2024-05-06 DIAGNOSIS — E118 Type 2 diabetes mellitus with unspecified complications: Secondary | ICD-10-CM | POA: Diagnosis not present

## 2024-05-06 DIAGNOSIS — N319 Neuromuscular dysfunction of bladder, unspecified: Secondary | ICD-10-CM | POA: Diagnosis not present

## 2024-05-06 DIAGNOSIS — M810 Age-related osteoporosis without current pathological fracture: Secondary | ICD-10-CM | POA: Diagnosis not present

## 2024-05-06 DIAGNOSIS — E782 Mixed hyperlipidemia: Secondary | ICD-10-CM | POA: Diagnosis not present

## 2024-05-06 DIAGNOSIS — R609 Edema, unspecified: Secondary | ICD-10-CM | POA: Diagnosis not present

## 2024-05-06 DIAGNOSIS — Z800 Family history of malignant neoplasm of digestive organs: Secondary | ICD-10-CM

## 2024-05-06 DIAGNOSIS — K760 Fatty (change of) liver, not elsewhere classified: Secondary | ICD-10-CM | POA: Diagnosis not present

## 2024-05-06 DIAGNOSIS — Z1211 Encounter for screening for malignant neoplasm of colon: Secondary | ICD-10-CM

## 2024-05-06 DIAGNOSIS — Z120 Encounter for screening for malignant neoplasm of stomach: Secondary | ICD-10-CM

## 2024-05-06 DIAGNOSIS — G629 Polyneuropathy, unspecified: Secondary | ICD-10-CM | POA: Diagnosis not present

## 2024-05-06 DIAGNOSIS — R404 Transient alteration of awareness: Secondary | ICD-10-CM | POA: Diagnosis not present

## 2024-05-06 DIAGNOSIS — G809 Cerebral palsy, unspecified: Secondary | ICD-10-CM | POA: Diagnosis not present

## 2024-05-06 DIAGNOSIS — J309 Allergic rhinitis, unspecified: Secondary | ICD-10-CM | POA: Diagnosis not present

## 2024-05-06 DIAGNOSIS — E220 Acromegaly and pituitary gigantism: Secondary | ICD-10-CM

## 2024-05-06 MED ORDER — NA SULFATE-K SULFATE-MG SULF 17.5-3.13-1.6 GM/177ML PO SOLN
ORAL | 0 refills | Status: AC
Start: 2024-05-06 — End: ?

## 2024-05-06 NOTE — Patient Instructions (Signed)
 You have been scheduled for an endoscopy and colonoscopy. Please follow the written instructions given to you at your visit today.  If you use inhalers (even only as needed), please bring them with you on the day of your procedure.  DO NOT TAKE 7 DAYS PRIOR TO TEST- Trulicity (dulaglutide) Ozempic, Wegovy (semaglutide) Mounjaro (tirzepatide) Bydureon Bcise (exanatide extended release)  DO NOT TAKE 1 DAY PRIOR TO YOUR TEST Rybelsus (semaglutide) Adlyxin (lixisenatide) Victoza (liraglutide) Byetta (exanatide) ___________________________________________________________________________  We have sent the following medications to your pharmacy for you to pick up at your convenience: Suprep  _______________________________________________________  If your blood pressure at your visit was 140/90 or greater, please contact your primary care physician to follow up on this.  _______________________________________________________  If you are age 44 or older, your body mass index should be between 23-30. Your Body mass index is 35.24 kg/m. If this is out of the aforementioned range listed, please consider follow up with your Primary Care Provider.  If you are age 90 or younger, your body mass index should be between 19-25. Your Body mass index is 35.24 kg/m. If this is out of the aformentioned range listed, please consider follow up with your Primary Care Provider.   ________________________________________________________  The [facility] providers would like to encourage you to use MYCHART to communicate with providers for non-urgent requests or questions.  Due to long hold times on the telephone, sending your provider a message by [facility] may be a faster and more efficient way to get a response.  Please allow 48 business hours for a response.  Please remember that this is for non-urgent requests.  _______________________________________________________  Due to recent changes in healthcare  laws, you may see the results of your imaging and laboratory studies on MyChart before your provider has had a chance to review them.  We understand that in some cases there may be results that are confusing or concerning to you. Not all laboratory results come back in the same time frame and the provider may be waiting for multiple results in order to interpret others.  Please give us  48 hours in order for your provider to thoroughly review all the results before contacting the office for clarification of your results.   Thank you for entrusting me with your care and for choosing [facility], Dr. Estefana Kidney

## 2024-05-06 NOTE — Progress Notes (Signed)
 Chief Complaint: Colon cancer and gastric cancer screening  Review of pertinent gastrointestinal problems: 1. Increased risk for colon and gastric cancer given underlying acromegaly:  Colonoscopy, EGD 11/2016 Dr. Teressa were both normal with some internal/external hemorrhoids, recommended recalls at 5 years.    HPI:    [name] is a 68 year old Caucasian female with a past medical history as listed below including acromegaly and COPD, wheelchair-bound presents to discuss getting an EGD and colonoscopy.  Interval History: She is not having any symptoms currently, but she does know that she is overdue for her EGD and colonoscopy. Denies diarrhea or ab pain. Sometimes she gets constipated. She has one BM on average once every couple of days. Denies blood in the stools. Denies unintentional weight loss. Denies N&V, dysphagia, chest burning, or regurgitation. Denies family history of GI issues. Brother had esophageal cancer, not sure what kind exactly. She quit smoking in 2005. She drinks alcohol a few times a year.   Wt Readings from Last 3 Encounters:  05/06/24 225 lb (102.1 kg)  09/11/23 250 lb (113.4 kg)  07/25/23 250 lb 7.1 oz (113.6 kg)   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HCC)    Anxiety    Arthritis    Benign paroxysmal positional vertigo 02/24/2013   Bipolar disorder (HCC)    Cancer (HCC)    skin cancer   Cathard    Cerebral palsy (HCC)    Colitis    Depression    bi polar   Diabetes mellitus, type II (HCC)    Dysplasia of cervix    GERD (gastroesophageal reflux disease)    Hardening of the arteries of the heart 2023   Headache(784.0)    High cholesterol    History of chicken pox    History of measles    History of mumps    IBS (irritable bowel syndrome)    Low potassium syndrome    Melanoma in situ (HCC)    Neuromuscular disorder (HCC)    cerebral palsy   Ovarian cyst    Peripheral neuropathy    Seizures (HCC)    Stenosis, cervical spine  02/2015   Trichomonas     Past Surgical History:  Procedure Laterality Date   ANTERIOR CERVICAL DECOMP/DISCECTOMY FUSION N/A 05/25/2016   Procedure: Cervical five-six Anterior cervical decompression/diskectomy/fusion;  Surgeon: Rockey Peru, MD;  [facility]: [facility];  Service: Neurosurgery;  Laterality: N/A;   APPENDECTOMY     CARPAL TUNNEL RELEASE     CARPAL TUNNEL RELEASE Right 08/02/2017   Procedure: CARPAL TUNNEL RELEASE RIGHT;  Surgeon: Peru Rockey, MD;  [facility]: [facility] OR;  Service: Neurosurgery;  Laterality: Right;  CARPAL TUNNEL RELEASE RIGHT   CARPAL TUNNEL RELEASE Left 11/29/2017   Procedure: CARPAL TUNNEL RELEASE LEFT;  Surgeon: Peru Rockey, MD;  [facility]: [facility] OR;  Service: Neurosurgery;  Laterality: Left;  CARPAL TUNNEL RELEASE LEFT   COLONOSCOPY WITH PROPOFOL  N/A 11/29/2016   Procedure: COLONOSCOPY WITH PROPOFOL ;  Surgeon: Toribio SHAUNNA Teressa, MD;  [facility]: [facility];  Service: Endoscopy;  Laterality: N/A;   DILATATION & CURETTAGE/HYSTEROSCOPY WITH MYOSURE  09/11/2023   Procedure: DILATATION & CURETTAGE/HYSTEROSCOPY WITH MYOSURE;  Surgeon: Jeralyn Crutch, MD;  [facility]: [facility];  Service: Gynecology;;   ESOPHAGOGASTRODUODENOSCOPY (EGD) WITH PROPOFOL  N/A 11/29/2016   Procedure: ESOPHAGOGASTRODUODENOSCOPY (EGD) WITH PROPOFOL ;  Surgeon: Toribio SHAUNNA Teressa, MD;  [facility];  Service: Endoscopy;  Laterality: N/A;   IR INJECT/THERA/INC NEEDLE/CATH/PLC EPI/CERV/THOR [facility]  03/04/2020   LEG TENDON SURGERY  NASAL FRACTURE SURGERY     OPERATIVE ULTRASOUND N/A 09/11/2023   Procedure: OPERATIVE ULTRASOUND;  Surgeon: Jeralyn Crutch, MD;  Location: [facility] OR;  Service: Gynecology;  Laterality: N/A;   OVARIAN CYST REMOVAL     REPLACEMENT TOTAL KNEE BILATERAL  10/29/2004   rotator cuff surgery Right     Current Outpatient Medications  Medication Sig Dispense Refill   albuterol  (PROVENTIL  HFA;VENTOLIN  HFA) 108 (90 Base) MCG/ACT inhaler Inhale 1-2 puffs into the lungs  every 6 (six) hours as needed for wheezing or shortness of breath.     amitriptyline  (ELAVIL ) 50 MG tablet Take 1 tablet (50 mg total) by mouth at bedtime. 90 tablet 0   AQUALANCE LANCETS 30G MISC      Ascorbic Acid  (VITAMIN C ) 500 MG CAPS Take 500 mg by mouth at bedtime.      atorvastatin  (LIPITOR) 40 MG tablet 1 tablet     B Complex Vitamins (B COMPLEX 1 PO) Take by mouth.     Blood Glucose Monitoring Suppl (ONE TOUCH ULTRA 2) w/Device KIT      fluticasone  (FLONASE ) 50 MCG/ACT nasal spray 2 sprays by Each Nare route daily.     gabapentin  (NEURONTIN ) 600 MG tablet Take 100 mg by mouth 2 (two) times daily.     glipiZIDE (GLUCOTROL XL) 10 MG 24 hr tablet Take 5 mg by mouth daily.     lamoTRIgine  (LAMICTAL ) 200 MG tablet Take 1 tablet (200 mg total) by mouth daily. 90 tablet 0   Lancet Devices (ADJUSTABLE LANCING DEVICE) MISC      magnesium  30 MG tablet Take 30 mg by mouth daily.     metFORMIN  (GLUCOPHAGE -XR) 500 MG 24 hr tablet Take 500 mg by mouth daily with breakfast. IN THE MORNING.     methocarbamol  (ROBAXIN ) 500 MG tablet Take 1 tablet (500 mg total) by mouth 3 (three) times daily. 30 tablet 0   misoprostol  (CYTOTEC ) 200 MCG tablet Place two tablets in the vagina the night prior to your procedure 2 tablet 0   Multiple Vitamins-Minerals (WOMENS 50+ MULTI VITAMIN PO) Take by mouth.     Niacin-Polypodium Leucotomos (HELIOCARE ADVANCED) 250-120 MG CAPS Take by mouth daily.     ondansetron  (ZOFRAN -ODT) 4 MG disintegrating tablet Take 1 tablet (4 mg total) by mouth every 8 (eight) hours as needed for nausea or vomiting. 20 tablet 0   ONE TOUCH ULTRA TEST test strip      orlistat (XENICAL) 120 MG capsule Take by mouth.     pilocarpine (SALAGEN) 5 MG tablet Take by mouth.     Polyethyl Glycol-Propyl Glycol 0.4-0.3 % SOLN Place 1-2 drops into both eyes 3 (three) times daily as needed (for dry eyes.).     potassium chloride  (KLOR-CON ) 10 MEQ tablet Take 10 mEq by mouth daily.     Propylene  Glycol-Glycerin 1-0.3 % SOLN Apply 1 drop to eye in the morning and at bedtime.     risedronate (ACTONEL) 150 MG tablet Take 150 mg by mouth every 30 (thirty) days.     spironolactone  (ALDACTONE ) 100 MG tablet Take 100 mg by mouth daily.  5   No current facility-administered medications for this visit.    Allergies as of 05/06/2024 - Review Complete 05/06/2024  Allergen Reaction Noted   Miconazole  05/30/2023   Miconazole nitrate Hives and Itching 11/21/2011   Sulfonamide derivatives Rash 05/30/2023    Family History  Problem Relation Age of Onset   Suicidality Father    Depression Father  Alcohol abuse Father    Emphysema Father        smoked   Drug abuse Brother    Alcohol abuse Brother    Esophageal cancer Brother    Depression Maternal Aunt    Alcohol abuse Brother    Anxiety disorder Daughter    Depression Daughter    Suicidality Daughter    Alcohol abuse Daughter    Drug abuse Son    Alcohol abuse Son    Emphysema Sister        smoked    Social History   Socioeconomic History   Marital status: Divorced    Spouse name: Not on file   Number of children: Not on file   Years of education: graduate   Highest education level: Not on file  Occupational History   Occupation: Disabled    Employer: [employer]  Tobacco Use   Smoking status: Former    Current packs/day: 0.00    Average packs/day: 1 pack/day for 31.0 years (31.0 ttl pk-yrs)    Types: Cigarettes    Start date: 11/17/1968    Quit date: 11/18/1999    Years since quitting: 24.4   Smokeless tobacco: Never  Vaping Use   Vaping status: Never Used  Substance and Sexual Activity   Alcohol use: Not Currently    Comment: rare   Drug use: No   Sexual activity: Never  Other Topics Concern   Not on file  Social History Narrative   Right handed, Disabled. Live boyfriend , [name], Caffeine rare.  Disabled.   One level home. Bachelors degree   Social Drivers of Corporate investment banker  Strain: Not on file  Food Insecurity: Low Risk  (05/31/2023)   Received from [facility]   Hunger Vital Sign    Within the past 12 months, you worried that your food would run out before you got money to buy more: Never true    Within the past 12 months, the food you bought just didn't last and you didn't have money to get more. : Never true  Transportation Needs: Not on file (05/31/2023)  Physical Activity: Not on file  Stress: Not on file  Social Connections: Not on file  Intimate Partner Violence: Not on file    Physical Exam:  Vital signs: BP 122/68   Pulse (!) 47   Ht 5' 7 (1.702 m)   Wt 225 lb (102.1 kg)   BMI 35.24 kg/m   Constitutional:   Pleasant obese Caucasian female appears to be in NAD, Well developed, Well nourished, alert and cooperative Respiratory: Respirations even and unlabored. Lungs clear to auscultation bilaterally.   No wheezes, crackles, or rhonchi.  Cardiovascular: Normal S1, S2. No MRG. Regular rate and rhythm. No peripheral edema, cyanosis or pallor.  Gastrointestinal:  Soft, nondistended, nontender. No rebound or guarding. Normal bowel sounds. No appreciable masses or hepatomegaly. Msk:  Symmetrical without gross deformities. Wheelchair dependent Psychiatric: Demonstrates good judgement and reason without abnormal affect or behaviors.  RELEVANT LABS AND IMAGING: CBC    Component Value Date/Time   WBC 15.9 (H) 09/11/2023 1033   RBC 5.31 (H) 09/11/2023 1033   HGB 15.1 (H) 09/11/2023 1033   HCT 46.8 (H) 09/11/2023 1033   PLT 416 (H) 09/11/2023 1033   MCV 88.1 09/11/2023 1033   MCH 28.4 09/11/2023 1033   MCHC 32.3 09/11/2023 1033   RDW 13.5 09/11/2023 1033   LYMPHSABS 2.3 12/15/2015 1428   MONOABS 0.6 12/15/2015 1428   EOSABS  0.1 12/15/2015 1428   BASOSABS 0.0 12/15/2015 1428    CMP     Component Value Date/Time   NA 137 09/11/2023 1033   K 3.8 09/11/2023 1033   CL 106 09/11/2023 1033   CO2 18 (L) 09/11/2023 1033   GLUCOSE 85 09/11/2023  1033   GLUCOSE 105 (H) 09/30/2006 1552   BUN 14 09/11/2023 1033   CREATININE 0.68 09/11/2023 1033   CREATININE 0.69 12/15/2015 1428   CALCIUM  9.2 09/11/2023 1033   PROT 7.1 12/15/2015 1428   ALBUMIN 4.5 12/15/2015 1428   AST 20 12/15/2015 1428   ALT 28 12/15/2015 1428   ALKPHOS 80 12/15/2015 1428   BILITOT 0.4 12/15/2015 1428   GFRNONAA >60 09/11/2023 1033   GFRNONAA >89 12/15/2015 1428   GFRAA >60 11/29/2017 0923   GFRAA >89 12/15/2015 1428    Assessment: Colon cancer screening Gastric cancer screening due to history of acromegaly Constipation Family history of esophageal cancer Patient presents for gastric and colon cancer screening due to her personal history of acromegaly. She does have family history of esophageal cancer, unclear what kind, in her brother. Her last EGD/colonoscopy in 2018 only showed some internal/external hemorrhoids but was otherwise unremarkable. She is asymptomatic from a GI standpoint, though it looks like she has had some weight loss over time. I went over the risks and benefits of the EGD and colonoscopy procedures in detail with the patient, and she is agreeable to proceeding. Patient is wheelchair bound and has difficulty transferring so will plan to have her procedure done at the hospital  Plan: - EGD/colonoscopy [facility] with a 2-day prep due to difficulty transferring independently to a bed  Estefana Kidney, MD [facility] 05/06/2024, 11:05 AM  I spent 45 minutes of time, including in depth chart review, independent review of results as outlined above, communicating results with the patient directly, face-to-face time with the patient, coordinating care, ordering studies and medications as appropriate, and documentation.

## 2024-05-08 DIAGNOSIS — R3914 Feeling of incomplete bladder emptying: Secondary | ICD-10-CM | POA: Diagnosis not present

## 2024-05-08 DIAGNOSIS — R338 Other retention of urine: Secondary | ICD-10-CM | POA: Diagnosis not present

## 2024-05-18 DIAGNOSIS — G4733 Obstructive sleep apnea (adult) (pediatric): Secondary | ICD-10-CM | POA: Diagnosis not present

## 2024-05-20 DIAGNOSIS — G4733 Obstructive sleep apnea (adult) (pediatric): Secondary | ICD-10-CM | POA: Diagnosis not present

## 2024-05-29 DIAGNOSIS — R3914 Feeling of incomplete bladder emptying: Secondary | ICD-10-CM | POA: Diagnosis not present

## 2024-05-29 DIAGNOSIS — N3021 Other chronic cystitis with hematuria: Secondary | ICD-10-CM | POA: Diagnosis not present

## 2024-06-02 DIAGNOSIS — R3914 Feeling of incomplete bladder emptying: Secondary | ICD-10-CM | POA: Diagnosis not present

## 2024-06-02 DIAGNOSIS — N3021 Other chronic cystitis with hematuria: Secondary | ICD-10-CM | POA: Diagnosis not present

## 2024-06-03 ENCOUNTER — Ambulatory Visit: Admitting: Family

## 2024-06-10 ENCOUNTER — Ambulatory Visit (INDEPENDENT_AMBULATORY_CARE_PROVIDER_SITE_OTHER): Admitting: Family

## 2024-06-10 ENCOUNTER — Encounter: Payer: Self-pay | Admitting: Family

## 2024-06-10 DIAGNOSIS — M25512 Pain in left shoulder: Secondary | ICD-10-CM | POA: Diagnosis not present

## 2024-06-10 DIAGNOSIS — M7542 Impingement syndrome of left shoulder: Secondary | ICD-10-CM

## 2024-06-10 DIAGNOSIS — R531 Weakness: Secondary | ICD-10-CM

## 2024-06-10 DIAGNOSIS — Z7409 Other reduced mobility: Secondary | ICD-10-CM | POA: Diagnosis not present

## 2024-06-10 DIAGNOSIS — G8929 Other chronic pain: Secondary | ICD-10-CM

## 2024-06-10 DIAGNOSIS — M25511 Pain in right shoulder: Secondary | ICD-10-CM

## 2024-06-10 NOTE — Progress Notes (Unsigned)
 Office Visit Note   Patient: [name]           Date of Birth: 05-04-1956           MRN: [id number] Visit Date: 06/10/2024              Requested by: Montey Lot, PA-C [id number] [place],  [place] [address] PCP: Montey Lot, PA-C  Chief Complaint  Patient presents with   Right Shoulder - Pain   Left Shoulder - Pain      HPI: The patient is a 68 year old woman who presents today complaining of left shoulder pain.  She denies any recent injuries she reports she is unable to reach above head behind back.  She has previously had some Depo-Medrol  injections of bilateral shoulders last had the left shoulder injected in February 2023.  The patient uses a power chair for mobility.  She has difficulty with her upper extremities as well.  She is unable to walk for any distance can only stand and pivot to transfer.  She is unable to lift above head behind back because excruciating pain in the shoulders as well as weakness in bilateral upper extremities.  Requires transportation assistance  She is also requesting assistance with her GETA paperwork  Assessment & Plan: Visit Diagnoses:  1. Chronic pain of both shoulders   2. Impingement syndrome of left shoulder   3. Mobility impaired   4. Weakness generalized     Plan: Depo-Medrol  injection left shoulder.  Patient tolerated well.  She will follow-up as needed.  Follow-Up Instructions: No follow-ups on file.   Left Shoulder Exam   Tenderness  The patient is experiencing tenderness in the biceps tendon (global).  Tests  Cross arm: positive Impingement: positive  Other  Erythema: absent Pulse: present       Patient is alert, oriented, no adenopathy, well-dressed, normal affect, normal respiratory effort.     Imaging: No results found. No images are attached to the encounter.  Labs: Lab Results  Component Value Date   HGBA1C 6.0 (H) 05/30/2023   HGBA1C 7.2 (H) 11/29/2017   HGBA1C 6.6 (H)  07/30/2017   REPTSTATUS 03/13/2024 FINAL 03/10/2024   CULT (A) 03/10/2024    >=100,000 COLONIES/mL ESCHERICHIA COLI >=100,000 COLONIES/mL KLEBSIELLA PNEUMONIAE    LABORGA ESCHERICHIA COLI (A) 03/10/2024   LABORGA KLEBSIELLA PNEUMONIAE (A) 03/10/2024     Lab Results  Component Value Date   ALBUMIN 4.5 12/15/2015   ALBUMIN 3.9 11/18/2013   ALBUMIN 3.9 07/07/2013    Lab Results  Component Value Date   MG 1.9 07/08/2013   No results found for: VD25OH  No results found for: PREALBUMIN    Latest Ref Rng & Units 09/11/2023   10:33 AM 07/25/2023    5:08 PM 05/16/2023    6:09 PM  CBC EXTENDED  WBC 4.0 - 10.5 K/uL 15.9  11.8  14.0   RBC 3.87 - 5.11 MIL/uL 5.31  5.00  5.33   Hemoglobin 12.0 - 15.0 g/dL 84.8  85.2  84.8   HCT 36.0 - 46.0 % 46.8  45.7  46.7   Platelets 150 - 400 K/uL 416  381  414      There is no height or weight on file to calculate BMI.  Orders:  Orders Placed This Encounter  Procedures   Large Joint Inj: L subacromial bursa   Meds ordered this encounter  Medications   lidocaine  (XYLOCAINE ) 1 % (with pres) injection 5 mL   methylPREDNISolone   acetate (DEPO-MEDROL ) injection 40 mg     Procedures: Large Joint Inj: L subacromial bursa on 06/10/2024 4:09 PM Indications: pain Details: 22 G 1.5 in needle Medications: 5 mL lidocaine  1 %; 40 mg methylPREDNISolone  acetate 40 MG/ML Consent was given by the patient.      Clinical Data: No additional findings.  ROS:  All other systems negative, except as noted in the HPI. Review of Systems  Objective: Vital Signs: There were no vitals taken for this visit.  Specialty Comments:  No specialty comments available.  PMFS History: Patient Active Problem List   Diagnosis Date Noted   Bilateral shoulder pain 08/09/2022   Neck pain 11/21/2018   Low back pain 08/19/2018   Weakness of both lower extremities 08/19/2018   Pain in finger of left hand 05/28/2018   Pain in right hand 05/28/2018   Pain  in left elbow 04/02/2018   Chronic right shoulder pain 07/16/2017   Rotator cuff syndrome, right 05/13/2017   Cervical radiculopathy 01/14/2017   Colon cancer screening    Encounter for screening for gastric cancer    Osteoarthritis of spine with radiculopathy, cervical region 05/25/2016   Postmenopausal bleeding 08/04/2015   Acute respiratory failure (HCC) 07/19/2013   Nocturnal hypoxemia 07/07/2013   Lower urinary tract infectious disease 07/07/2013   Hypokalemia 07/07/2013   Abdominal pain, acute, right lower quadrant 07/07/2013   Benign paroxysmal positional vertigo 02/24/2013   Edema of both legs 02/24/2013   Dysplasia of cervix    Melanoma in situ [facility])    Bipolar 1 disorder (HCC) 02/08/2012   IBS 12/04/2007   RASH AND OTHER NONSPECIFIC SKIN ERUPTION 12/04/2007   EMPHYSEMA by CT only 09/05/2007   NECK MASS 09/05/2007   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HCC) 08/13/2007   Cerebral palsy (HCC) 08/13/2007   OTITIS EXTERNA, ACUTE 08/13/2007   IRRITABLE BOWEL SYNDROME, HX OF 08/13/2007   HYPERLIPIDEMIA 05/10/2007   DEPRESSION 05/10/2007   ALLERGIC RHINITIS 05/10/2007   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HCC)    Anxiety    Arthritis    Benign paroxysmal positional vertigo 02/24/2013   Bipolar disorder (HCC)    Cancer (HCC)    skin cancer   Cathard    Cerebral palsy (HCC)    Colitis    Depression    bi polar   Diabetes mellitus, type II (HCC)    Dysplasia of cervix    GERD (gastroesophageal reflux disease)    Hardening of the arteries of the heart 2023   Headache(784.0)    High cholesterol    History of chicken pox    History of measles    History of mumps    IBS (irritable bowel syndrome)    Low potassium syndrome    Melanoma in situ (HCC)    Neuromuscular disorder (HCC)    cerebral palsy   Ovarian cyst    Peripheral neuropathy    Seizures (HCC)    Stenosis, cervical spine 02/2015   Trichomonas     Family History  Problem Relation Age of Onset    Suicidality Father    Depression Father    Alcohol abuse Father    Emphysema Father        smoked   Drug abuse Brother    Alcohol abuse Brother    Esophageal cancer Brother    Depression Maternal Aunt    Alcohol abuse Brother    Anxiety disorder Daughter    Depression Daughter    Suicidality Daughter    Alcohol  abuse Daughter    Drug abuse Son    Alcohol abuse Son    Emphysema Sister        smoked    Past Surgical History:  Procedure Laterality Date   ANTERIOR CERVICAL DECOMP/DISCECTOMY FUSION N/A 05/25/2016   Procedure: Cervical five-six Anterior cervical decompression/diskectomy/fusion;  Surgeon: Rockey Peru, MD;  [facility];  Service: Neurosurgery;  Laterality: N/A;   APPENDECTOMY     CARPAL TUNNEL RELEASE     CARPAL TUNNEL RELEASE Right 08/02/2017   Procedure: CARPAL TUNNEL RELEASE RIGHT;  Surgeon: Peru Rockey, MD;  Location: [facility];  Service: Neurosurgery;  Laterality: Right;  CARPAL TUNNEL RELEASE RIGHT   CARPAL TUNNEL RELEASE Left 11/29/2017   Procedure: CARPAL TUNNEL RELEASE LEFT;  Surgeon: Peru Rockey, MD;  [facility];  Service: Neurosurgery;  Laterality: Left;  CARPAL TUNNEL RELEASE LEFT   COLONOSCOPY WITH PROPOFOL  N/A 11/29/2016   Procedure: COLONOSCOPY WITH PROPOFOL ;  Surgeon: Toribio SHAUNNA Cedar, MD;  [facility]: [facility];  Service: Endoscopy;  Laterality: N/A;   DILATATION & CURETTAGE/HYSTEROSCOPY WITH MYOSURE  09/11/2023   Procedure: DILATATION & CURETTAGE/HYSTEROSCOPY WITH MYOSURE;  Surgeon: Jeralyn Crutch, MD;  Location: [facility];  Service: Gynecology;;   ESOPHAGOGASTRODUODENOSCOPY (EGD) WITH PROPOFOL  N/A 11/29/2016   Procedure: ESOPHAGOGASTRODUODENOSCOPY (EGD) WITH PROPOFOL ;  Surgeon: Toribio SHAUNNA Cedar, MD;  [facility]: [facility];  Service: Endoscopy;  Laterality: N/A;   IR INJECT/THERA/INC NEEDLE/CATH/PLC EPI/CERV/THOR W/IMG  03/04/2020   LEG TENDON SURGERY     NASAL FRACTURE SURGERY     OPERATIVE ULTRASOUND N/A 09/11/2023   Procedure:  OPERATIVE ULTRASOUND;  Surgeon: Jeralyn Crutch, MD;  Location: [facility];  Service: Gynecology;  Laterality: N/A;   OVARIAN CYST REMOVAL     REPLACEMENT TOTAL KNEE BILATERAL  10/29/2004   rotator cuff surgery Right    Social History   Occupational History   Occupation: Disabled    Employer: [employer]  Tobacco Use   Smoking status: Former    Current packs/day: 0.00    Average packs/day: 1 pack/day for 31.0 years (31.0 ttl pk-yrs)    Types: Cigarettes    Start date: 11/17/1968    Quit date: 11/18/1999    Years since quitting: 24.6   Smokeless tobacco: Never  Vaping Use   Vaping status: Never Used  Substance and Sexual Activity   Alcohol use: Not Currently    Comment: rare   Drug use: No   Sexual activity: Never

## 2024-06-16 MED ORDER — LIDOCAINE HCL 1 % IJ SOLN
5.0000 mL | INTRAMUSCULAR | Status: AC | PRN
  Administered 2024-06-10: 5 mL

## 2024-06-16 MED ORDER — METHYLPREDNISOLONE ACETATE 40 MG/ML IJ SUSP
40.0000 mg | INTRAMUSCULAR | Status: AC | PRN
  Administered 2024-06-10: 40 mg via INTRA_ARTICULAR

## 2024-06-17 DIAGNOSIS — G4733 Obstructive sleep apnea (adult) (pediatric): Secondary | ICD-10-CM | POA: Diagnosis not present

## 2024-06-18 ENCOUNTER — Telehealth: Payer: Self-pay | Admitting: Orthopedic Surgery

## 2024-06-18 NOTE — Telephone Encounter (Signed)
 Patient called and wanted to know if  he signed off for her scooter. CB#[phone]

## 2024-06-18 NOTE — Telephone Encounter (Signed)
 I  have the paperwork at my desk. The form that you filled out transportation is saying is not detailed enough to explain why patient can not use regular bus route and services. Can make an addendum.

## 2024-06-19 ENCOUNTER — Other Ambulatory Visit: Payer: Self-pay

## 2024-06-19 ENCOUNTER — Encounter (HOSPITAL_COMMUNITY): Payer: Self-pay | Admitting: Internal Medicine

## 2024-06-19 NOTE — Telephone Encounter (Signed)
 I called pt to advise Etrin made addendum to last OV note and this will be faxed back today.

## 2024-06-19 NOTE — Progress Notes (Addendum)
 PCP - Rankin Dike PA-C    Jackline , N Cardiologist - no  Neuro- Donnice Shams , MD LOV 03-27-24 epic  PPM/ICD -  Device Orders -  Rep Notified -   Chest x-ray -  EKG - 09-11-23 epic Stress Test -  ECHO -  Cardiac Cath -   Sleep Study - y CPAP - no  Fasting Blood Sugar -  Checks Blood Sugar __0___ times a day  Blood Thinner Instructions: n/a Aspirin  Instructions:n/a    COVID vaccine -yes  Activity--Able to complete ADL's with no CP or SOB  has help gets dressed alone rides scooter has help cleaning home . No recent issues with CP or SOB   Anesthesia review: DM 2,CAD, cerebral palsy Spastic Diplegia, uses scooter , seizures, OSA no cpap, Cervical spinal stenosis  Patient denies shortness of breath, fever, cough and chest pain at PAT appointment   All instructions explained to the patient, with a verbal understanding of the material. Patient agrees to go over the instructions while at home for a better understanding. Patient also instructed to self quarantine after being tested for COVID-19. The opportunity to ask questions was provided.

## 2024-06-19 NOTE — Telephone Encounter (Signed)
 done

## 2024-06-20 DIAGNOSIS — G4733 Obstructive sleep apnea (adult) (pediatric): Secondary | ICD-10-CM | POA: Diagnosis not present

## 2024-06-22 ENCOUNTER — Telehealth: Payer: Self-pay | Admitting: Family

## 2024-06-22 NOTE — Telephone Encounter (Signed)
 Refaxed [place] paperwork with 06/10/24 ov notes [phone] per [facility] request.

## 2024-06-23 DIAGNOSIS — R338 Other retention of urine: Secondary | ICD-10-CM | POA: Diagnosis not present

## 2024-06-25 ENCOUNTER — Telehealth: Payer: Self-pay

## 2024-06-25 NOTE — Telephone Encounter (Signed)
 Procedure:COLON Procedure date: 06/30/24 Procedure location: [facility] Arrival Time: 7:15 Spoke with the patient Y/N: Y Any prep concerns? N  Has the patient obtained the prep from the pharmacy ? Y Do you have a care partner and transportation: Y Any additional concerns? N

## 2024-06-29 DIAGNOSIS — N3021 Other chronic cystitis with hematuria: Secondary | ICD-10-CM | POA: Diagnosis not present

## 2024-06-29 DIAGNOSIS — R3914 Feeling of incomplete bladder emptying: Secondary | ICD-10-CM | POA: Diagnosis not present

## 2024-06-30 ENCOUNTER — Other Ambulatory Visit: Payer: Self-pay

## 2024-06-30 ENCOUNTER — Ambulatory Visit (HOSPITAL_BASED_OUTPATIENT_CLINIC_OR_DEPARTMENT_OTHER): Payer: Self-pay | Admitting: Physician Assistant

## 2024-06-30 ENCOUNTER — Ambulatory Visit (HOSPITAL_COMMUNITY)
Admission: RE | Admit: 2024-06-30 | Discharge: 2024-06-30 | Disposition: A | Discharge status: Home / Self Care | Attending: Internal Medicine | Admitting: Internal Medicine

## 2024-06-30 ENCOUNTER — Encounter (HOSPITAL_COMMUNITY): Payer: Self-pay | Admitting: Internal Medicine

## 2024-06-30 ENCOUNTER — Ambulatory Visit (HOSPITAL_COMMUNITY): Payer: Self-pay | Admitting: Physician Assistant

## 2024-06-30 ENCOUNTER — Encounter (HOSPITAL_COMMUNITY)
Admission: RE | Disposition: A | Discharge status: Home / Self Care | Payer: Self-pay | Source: Home / Self Care | Attending: Internal Medicine

## 2024-06-30 DIAGNOSIS — K2950 Unspecified chronic gastritis without bleeding: Secondary | ICD-10-CM | POA: Diagnosis not present

## 2024-06-30 DIAGNOSIS — K3189 Other diseases of stomach and duodenum: Secondary | ICD-10-CM

## 2024-06-30 DIAGNOSIS — K259 Gastric ulcer, unspecified as acute or chronic, without hemorrhage or perforation: Secondary | ICD-10-CM | POA: Diagnosis not present

## 2024-06-30 DIAGNOSIS — K229 Disease of esophagus, unspecified: Secondary | ICD-10-CM | POA: Diagnosis not present

## 2024-06-30 DIAGNOSIS — Z1211 Encounter for screening for malignant neoplasm of colon: Secondary | ICD-10-CM | POA: Insufficient documentation

## 2024-06-30 DIAGNOSIS — K635 Polyp of colon: Secondary | ICD-10-CM | POA: Diagnosis not present

## 2024-06-30 DIAGNOSIS — Z860100 Personal history of colon polyps, unspecified: Secondary | ICD-10-CM | POA: Diagnosis not present

## 2024-06-30 DIAGNOSIS — R569 Unspecified convulsions: Secondary | ICD-10-CM | POA: Diagnosis not present

## 2024-06-30 DIAGNOSIS — G4730 Sleep apnea, unspecified: Secondary | ICD-10-CM | POA: Insufficient documentation

## 2024-06-30 DIAGNOSIS — Z87891 Personal history of nicotine dependence: Secondary | ICD-10-CM | POA: Diagnosis not present

## 2024-06-30 DIAGNOSIS — Z7984 Long term (current) use of oral hypoglycemic drugs: Secondary | ICD-10-CM | POA: Diagnosis not present

## 2024-06-30 DIAGNOSIS — E220 Acromegaly and pituitary gigantism: Secondary | ICD-10-CM | POA: Diagnosis not present

## 2024-06-30 DIAGNOSIS — G809 Cerebral palsy, unspecified: Secondary | ICD-10-CM | POA: Diagnosis not present

## 2024-06-30 DIAGNOSIS — D125 Benign neoplasm of sigmoid colon: Secondary | ICD-10-CM | POA: Insufficient documentation

## 2024-06-30 DIAGNOSIS — K31A19 Gastric intestinal metaplasia without dysplasia, unspecified site: Secondary | ICD-10-CM | POA: Diagnosis not present

## 2024-06-30 DIAGNOSIS — Z800 Family history of malignant neoplasm of digestive organs: Secondary | ICD-10-CM | POA: Insufficient documentation

## 2024-06-30 DIAGNOSIS — K209 Esophagitis, unspecified without bleeding: Secondary | ICD-10-CM | POA: Diagnosis not present

## 2024-06-30 DIAGNOSIS — K648 Other hemorrhoids: Secondary | ICD-10-CM | POA: Diagnosis not present

## 2024-06-30 DIAGNOSIS — K5900 Constipation, unspecified: Secondary | ICD-10-CM

## 2024-06-30 DIAGNOSIS — K31A11 Gastric intestinal metaplasia without dysplasia, involving the antrum: Secondary | ICD-10-CM

## 2024-06-30 DIAGNOSIS — Z120 Encounter for screening for malignant neoplasm of stomach: Secondary | ICD-10-CM | POA: Diagnosis not present

## 2024-06-30 DIAGNOSIS — J449 Chronic obstructive pulmonary disease, unspecified: Secondary | ICD-10-CM

## 2024-06-30 DIAGNOSIS — F418 Other specified anxiety disorders: Secondary | ICD-10-CM

## 2024-06-30 DIAGNOSIS — E119 Type 2 diabetes mellitus without complications: Secondary | ICD-10-CM | POA: Diagnosis not present

## 2024-06-30 DIAGNOSIS — K2100 Gastro-esophageal reflux disease with esophagitis, without bleeding: Secondary | ICD-10-CM | POA: Insufficient documentation

## 2024-06-30 HISTORY — DX: Presence of other specified devices: Z97.8

## 2024-06-30 HISTORY — DX: Pneumonia, unspecified organism: J18.9

## 2024-06-30 HISTORY — PX: ESOPHAGOGASTRODUODENOSCOPY: SHX5428

## 2024-06-30 HISTORY — DX: Sepsis, unspecified organism: A41.9

## 2024-06-30 HISTORY — DX: Sleep apnea, unspecified: G47.30

## 2024-06-30 HISTORY — PX: COLONOSCOPY: SHX5424

## 2024-06-30 LAB — GLUCOSE, CAPILLARY: Glucose-Capillary: 119 mg/dL — ABNORMAL HIGH (ref 70–99)

## 2024-06-30 SURGERY — COLONOSCOPY
Anesthesia: Monitor Anesthesia Care

## 2024-06-30 MED ORDER — LACTATED RINGERS IV SOLN: INTRAVENOUS | Status: DC

## 2024-06-30 MED ORDER — FLEET ENEMA RE ENEM
ENEMA | RECTAL | Status: AC
  Filled 2024-06-30: qty 1

## 2024-06-30 MED ORDER — PROPOFOL 10 MG/ML IV BOLUS
INTRAVENOUS | Status: DC | PRN
  Administered 2024-06-30 (×2): 20 mg via INTRAVENOUS

## 2024-06-30 MED ORDER — SODIUM CHLORIDE 0.9 % IV SOLN: INTRAVENOUS | Status: DC

## 2024-06-30 MED ORDER — LIDOCAINE 2% (20 MG/ML) 5 ML SYRINGE
INTRAMUSCULAR | Status: DC | PRN
  Administered 2024-06-30: 100 mg via INTRAVENOUS

## 2024-06-30 MED ORDER — PROPOFOL 500 MG/50ML IV EMUL
INTRAVENOUS | Status: DC | PRN
Start: 2024-06-30 — End: 2024-06-30
  Administered 2024-06-30: 140 ug/kg/min via INTRAVENOUS

## 2024-06-30 MED ORDER — FLEET ENEMA RE ENEM
1.0000 | ENEMA | Freq: Once | RECTAL | Status: AC
  Administered 2024-06-30: 1 via RECTAL

## 2024-06-30 MED ORDER — NYSTATIN 100000 UNIT/ML MT SUSP: 5.0000 mL | Freq: Four times a day (QID) | OROMUCOSAL | 0 refills | Status: AC

## 2024-06-30 MED ORDER — PROPOFOL 1000 MG/100ML IV EMUL
INTRAVENOUS | Status: AC
  Filled 2024-06-30: qty 100

## 2024-06-30 NOTE — Transfer of Care (Signed)
 Immediate Anesthesia Transfer of Care Note  Patient: [name]  Procedure(s) Performed: COLONOSCOPY EGD (ESOPHAGOGASTRODUODENOSCOPY)  Patient Location: PACU  Anesthesia Type:MAC  Level of Consciousness: sedated  Airway & Oxygen  Therapy: Patient Spontanous Breathing and Patient connected to face mask oxygen   Post-op Assessment: Report given to RN and Post -op Vital signs reviewed and stable  Post vital signs: Reviewed and stable  Last Vitals:  Vitals Value Taken Time  BP    Temp    Pulse 93 06/30/24 10:13  Resp 20 06/30/24 10:13  SpO2 100 % 06/30/24 10:13  Vitals shown include unfiled device data.  Last Pain:  Vitals:   06/30/24 0743  TempSrc: Temporal         Complications: No notable events documented.

## 2024-06-30 NOTE — Op Note (Signed)
 [facility] Patient Name: [name] Procedure Date: 06/30/2024 MRN: [id number] Attending MD: Rosario Estefana Kidney , , [phone] Date of Birth: [birth year] CSN: [id number] Age: 68 Admit Type: Outpatient Procedure:                Upper GI endoscopy Indications:              Family history of esophageal cancer, screening for                            gastric cancer, history of acromegaly Providers:                Rosario Estefana Kidney, Willy Hummer, RN, Corene Southgate, Technician Referring MD:             Rankin Grebe, MD Medicines:                Monitored Anesthesia Care Complications:            No immediate complications. Estimated Blood Loss:     Estimated blood loss was minimal. Procedure:                Pre-Anesthesia Assessment:                           - Prior to the procedure, a History and Physical                            was performed, and patient medications and                            allergies were reviewed. The patient's tolerance of                            previous anesthesia was also reviewed. The risks                            and benefits of the procedure and the sedation                            options and risks were discussed with the patient.                            All questions were answered, and informed consent                            was obtained. Prior Anticoagulants: The patient has                            taken no anticoagulant or antiplatelet agents. ASA                            Grade Assessment: III - A patient with severe  systemic disease. After reviewing the risks and                            benefits, the patient was deemed in satisfactory                            condition to undergo the procedure.                           After obtaining informed consent, the endoscope was                            passed under direct vision. Throughout the                             procedure, the patient's blood pressure, pulse, and                            oxygen  saturations were monitored continuously. The                            [id number]-[id number] ([id number]) Olympus endoscope was introduced                            through the mouth, and advanced to the second part                            of duodenum. The upper GI endoscopy was                            accomplished without difficulty. The patient                            tolerated the procedure well. Scope In: Scope Out: Findings:      Localized, white plaques were found in the entire esophagus. Biopsies       were taken with a cold forceps for histology.      Localized inflammation characterized by congestion (edema), erosions and       erythema was found in the gastric antrum. Biopsies were taken with a       cold forceps for histology.      The examined duodenum was normal. Impression:               - Esophageal plaques were found, suspicious for                            candidiasis. Biopsied.                           - Gastritis. Biopsied.                           - Normal examined duodenum. Moderate Sedation:      Not Applicable - Patient had care per Anesthesia. Recommendation:           - Await pathology results.                           -  Start Nystatin  swish and swallow for 14 days                            since patient is allergic to azole medications.                           - Perform a colonoscopy today. Procedure Code(s):        --- Professional ---                           [phone], Esophagogastroduodenoscopy, flexible,                            transoral; with biopsy, single or multiple Diagnosis Code(s):        --- Professional ---                           K22.9, Disease of esophagus, unspecified                           K29.70, Gastritis, unspecified, without bleeding                           Z80.0, Family history of malignant neoplasm of                             digestive organs CPT copyright 2022 American Medical Association. All rights reserved. The codes documented in this report are preliminary and upon coder review may  be revised to meet current compliance requirements. Dr Estefana Federico Rosario Estefana Federico,  06/30/2024 10:14:37 AM Number of Addenda: 0

## 2024-06-30 NOTE — H&P (Signed)
 GASTROENTEROLOGY PROCEDURE H&P NOTE   Primary Care Physician: Montey Lot, PA-C    Reason for Procedure:   Colon cancer screening, gastric cancer screening, history of acromegaly, family history of esophageal cancer  Plan:    EGD/colonoscopy  Patient is appropriate for endoscopic procedure(s) in the ambulatory (hospital) setting.  The nature of the procedure, as well as the risks, benefits, and alternatives were carefully and thoroughly reviewed with the patient. Ample time for discussion and questions allowed. The patient understood, was satisfied, and agreed to proceed.     HPI: [name] is a 68 y.o. female who presents for EGD/colonoscopy for evaluation of colon cancer screening, gastric cancer screening, history of acromegaly, family history of esophageal cancer.  Patient was most recently seen in the Gastroenterology Clinic on 05/06/24.  No interval change in medical history since that appointment. Please refer to that note for full details regarding GI history and clinical presentation.   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HCC)    Anxiety    Arthritis    Benign paroxysmal positional vertigo 02/24/2013   Bipolar disorder (HCC)    Cancer (HCC)    skin cancer   [place]    Cerebral palsy (HCC)    Colitis    Depression    bi polar   Diabetes mellitus, type II (HCC)    Dysplasia of cervix    Foley catheter in place    GERD (gastroesophageal reflux disease)    Hardening of the arteries of the heart 2023   Headache(784.0)    High cholesterol    History of chicken pox    History of measles    History of mumps    IBS (irritable bowel syndrome)    Low potassium syndrome    Melanoma in situ (HCC)    Neuromuscular disorder (HCC)    cerebral palsy   Ovarian cyst    Peripheral neuropathy    Pneumonia    Seizures (HCC)    Sepsis (HCC)    Sleep apnea    no cpap   Stenosis, cervical spine 02/2015   Trichomonas     Past Surgical History:   Procedure Laterality Date   ANTERIOR CERVICAL DECOMP/DISCECTOMY FUSION N/A 05/25/2016   Procedure: Cervical five-six Anterior cervical decompression/diskectomy/fusion;  Surgeon: Rockey Peru, MD;  Location: [facility];  Service: Neurosurgery;  Laterality: N/A;   APPENDECTOMY     CARPAL TUNNEL RELEASE     CARPAL TUNNEL RELEASE Right 08/02/2017   Procedure: CARPAL TUNNEL RELEASE RIGHT;  Surgeon: Peru Rockey, MD;  Location: [facility] OR;  Service: Neurosurgery;  Laterality: Right;  CARPAL TUNNEL RELEASE RIGHT   CARPAL TUNNEL RELEASE Left 11/29/2017   Procedure: CARPAL TUNNEL RELEASE LEFT;  Surgeon: Peru Rockey, MD;  Location: [facility] OR;  Service: Neurosurgery;  Laterality: Left;  CARPAL TUNNEL RELEASE LEFT   COLONOSCOPY WITH PROPOFOL  N/A 11/29/2016   Procedure: COLONOSCOPY WITH PROPOFOL ;  Surgeon: Toribio SHAUNNA Cedar, MD;  Location: [facility] END[facility];  Service: Endoscopy;  Laterality: N/A;   DILATATION & CURETTAGE/HYSTEROSCOPY WITH MYOSURE  09/11/2023   Procedure: DILATATION & CURETTAGE/HYSTEROSCOPY WITH MYOSURE;  Surgeon: Jeralyn Crutch, MD;  Location: [facility] OR;  Service: Gynecology;;   ESOPHAGOGASTRODUODENOSCOPY (EGD) WITH PROPOFOL  N/A 11/29/2016   Procedure: ESOPHAGOGASTRODUODENOSCOPY (EGD) WITH PROPOFOL ;  Surgeon: Toribio SHAUNNA Cedar, MD;  Location: [facility] ENDOSCOPY;  Service: Endoscopy;  Laterality: N/A;   IR INJECT/THERA/INC NEEDLE/CATH/PLC EPI/CERV/THOR [facility]  03/04/2020   LEG TENDON SURGERY     NASAL FRACTURE SURGERY  OPERATIVE ULTRASOUND N/A 09/11/2023   Procedure: OPERATIVE ULTRASOUND;  Surgeon: Jeralyn Crutch, MD;  Location: [facility] OR;  Service: Gynecology;  Laterality: N/A;   OVARIAN CYST REMOVAL     REPLACEMENT TOTAL KNEE BILATERAL  10/29/2004   rotator cuff surgery Right     Prior to Admission medications   Medication Sig Start Date End Date Taking? Authorizing Provider  albuterol  (PROVENTIL  HFA;VENTOLIN  HFA) 108 (90 Base) MCG/ACT inhaler Inhale 1-2 puffs into the lungs every 6 (six) hours as  needed for wheezing or shortness of breath.    [provider]  amitriptyline  (ELAVIL ) 50 MG tablet Take 1 tablet (50 mg total) by mouth at bedtime. 04/09/24   Arfeen, Syed T, MD  AQUALANCE LANCETS 30G MISC  04/25/18   [provider]  Ascorbic Acid  (VITAMIN C ) 500 MG CAPS Take 500 mg by mouth at bedtime.     [provider]  atorvastatin  (LIPITOR) 40 MG tablet 1 tablet    [provider]  B Complex Vitamins (B COMPLEX 1 PO) Take by mouth. 03/12/14   [provider]  Blood Glucose Monitoring Suppl (ONE TOUCH ULTRA 2) w/Device KIT  05/05/18   [provider]  fluticasone  (FLONASE ) 50 MCG/ACT nasal spray Pt states not taking    [provider]  gabapentin  (NEURONTIN ) 600 MG tablet Take 100 mg by mouth 2 (two) times daily. 02/20/23   Hamrick, Maura L, MD  glipiZIDE (GLUCOTROL XL) 10 MG 24 hr tablet Take 5 mg by mouth daily. 04/08/19   [provider]  lamoTRIgine  (LAMICTAL ) 200 MG tablet Take 1 tablet (200 mg total) by mouth daily. 04/09/24   Curry Leni DASEN, MD  Lancet Devices (ADJUSTABLE LANCING DEVICE) MISC  05/05/18   [provider]  magnesium  30 MG tablet Take 30 mg by mouth daily.    [provider]  metFORMIN  (GLUCOPHAGE -XR) 500 MG 24 hr tablet Take 500 mg by mouth daily with breakfast. IN THE MORNING.    [provider]  methocarbamol  (ROBAXIN ) 500 MG tablet Take 1 tablet (500 mg total) by mouth 3 (three) times daily. Patient taking differently: Take 500 mg by mouth 3 (three) times daily. Pt states she is not taking this medication 01/22/24   Zamora, Erin R, NP  misoprostol  (CYTOTEC ) 200 MCG tablet Place two tablets in the vagina the night prior to your procedure 08/02/23   Ajewole, Christana, MD  Multiple Vitamins-Minerals (WOMENS 50+ MULTI VITAMIN PO) Take by mouth.    [provider]  Na Sulfate-K Sulfate-Mg Sulfate concentrate (SUPREP) 17.5-3.13-1.6 GM/177ML SOLN Use as directed; may use  generic; goodrx card if insurance will not cover generic 05/06/24   Federico Rosario BROCKS, MD  Niacin-Polypodium Leucotomos [facility] ADVANCED) 250-120 MG CAPS Take by mouth daily.    [provider]  ondansetron  (ZOFRAN -ODT) 4 MG disintegrating tablet Take 1 tablet (4 mg total) by mouth every 8 (eight) hours as needed for nausea or vomiting. 03/10/24   Janet Therisa PARAS, FNP  ONE TOUCH ULTRA TEST test strip  05/05/18   [provider]  orlistat (XENICAL) 120 MG capsule Take by mouth.    [provider]  pilocarpine (SALAGEN) 5 MG tablet Take by mouth. Pt. States not taking    [provider]  Polyethyl Glycol-Propyl Glycol 0.4-0.3 % SOLN Place 1-2 drops into both eyes 3 (three) times daily as needed (for dry eyes.).    [provider]  potassium chloride  (KLOR-CON ) 10 MEQ tablet Take 10 mEq by mouth daily.  05/16/21   [provider]  Propylene Glycol-Glycerin 1-0.3 % SOLN Apply 1 drop to eye in the morning and at bedtime.    [provider]  risedronate (ACTONEL) 150 MG tablet Take 150 mg by mouth every 30 (thirty) days. 06/09/20   [provider]  spironolactone  (ALDACTONE ) 100 MG tablet Take 100 mg by mouth daily. 01/28/15   [provider]    No current facility-administered medications for this encounter.    Allergies as of 05/06/2024 - Review Complete 05/06/2024  Allergen Reaction Noted   Miconazole  05/30/2023   Miconazole nitrate Hives and Itching 11/21/2011   Sulfonamide derivatives Rash 05/30/2023    Family History  Problem Relation Age of Onset   Suicidality Father    Depression Father    Alcohol abuse Father    Emphysema Father        smoked   Drug abuse Brother    Alcohol abuse Brother    Esophageal cancer Brother    Depression Maternal Aunt    Alcohol abuse Brother    Anxiety disorder Daughter    Depression Daughter    Suicidality Daughter    Alcohol abuse Daughter    Drug abuse Son    Alcohol abuse  Son    Emphysema Sister        smoked    Social History   Socioeconomic History   Marital status: Divorced    Spouse name: Not on file   Number of children: Not on file   Years of education: graduate   Highest education level: Not on file  Occupational History   Occupation: Disabled    Employer: [employer]  Tobacco Use   Smoking status: Former    Current packs/day: 0.00    Average packs/day: 1 pack/day for 31.0 years (31.0 ttl pk-yrs)    Types: Cigarettes    Start date: 11/17/1968    Quit date: 11/18/1999    Years since quitting: 24.6   Smokeless tobacco: Never  Vaping Use   Vaping status: Never Used  Substance and Sexual Activity   Alcohol use: Not Currently    Comment: rare   Drug use: No   Sexual activity: Not Currently  Other Topics Concern   Not on file  Social History Narrative   Right handed, Disabled. Live boyfriend , Elsie Essex, Caffeine rare.  Disabled.   One level home. Bachelors degree   Social Drivers of Corporate investment banker Strain: Not on file  Food Insecurity: Low Risk  (05/31/2023)   Received from [facility]   Hunger Vital Sign    Within the past 12 months, you worried that your food would run out before you got money to buy more: Never true    Within the past 12 months, the food you bought just didn't last and you didn't have money to get more. : Never true  Transportation Needs: Not on file (05/31/2023)  Physical Activity: Not on file  Stress: Not on file  Social Connections: Not on file  Intimate Partner Violence: Not on file    Physical Exam: Vital signs in last 24 hours: Wt 102.1 kg   BMI 35.24 kg/m  GEN: NAD EYE: Sclerae anicteric ENT: MMM CV: Non-tachycardic Pulm: No increased WOB GI: Soft NEURO:  Alert & Oriented   Estefana Kidney, MD [facility]   06/30/2024 7:30 AM

## 2024-06-30 NOTE — Op Note (Addendum)
 [facility] Patient Name: [name] Procedure Date: 06/30/2024 MRN: [id number] Attending MD: Rosario Estefana Kidney , , [phone] Date of Birth: 1956/01/12 CSN: [id number] Age: 68 Admit Type: Outpatient Procedure:                Colonoscopy Indications:              High risk colon cancer surveillance: Personal                            history of colonic polyps Providers:                Rosario Estefana Kidney, Willy Hummer, RN, Corene Southgate, Technician Referring MD:             Rankin Dike, PA Medicines:                Monitored Anesthesia Care Complications:            No immediate complications. Estimated Blood Loss:     Estimated blood loss was minimal. Procedure:                Pre-Anesthesia Assessment:                           - Prior to the procedure, a History and Physical                            was performed, and patient medications and                            allergies were reviewed. The patient's tolerance of                            previous anesthesia was also reviewed. The risks                            and benefits of the procedure and the sedation                            options and risks were discussed with the patient.                            All questions were answered, and informed consent                            was obtained. Prior Anticoagulants: The patient has                            taken no anticoagulant or antiplatelet agents. ASA                            Grade Assessment: III - A patient with severe  systemic disease. After reviewing the risks and                            benefits, the patient was deemed in satisfactory                            condition to undergo the procedure.                           After obtaining informed consent, the colonoscope                            was passed under direct vision. Throughout the                             procedure, the patient's blood pressure, pulse, and                            oxygen  saturations were monitored continuously. The                            [id number] ([id number] Olympus colonoscope was                            introduced through the anus and advanced to the the                            cecum, identified by appendiceal orifice and                            ileocecal valve. The colonoscopy was performed                            without difficulty. The patient tolerated the                            procedure well. The quality of the bowel                            preparation was adequate. The terminal ileum,                            ileocecal valve, appendiceal orifice, and rectum                            were photographed. Scope In: 9:41:52 AM Scope Out: 10:08:44 AM Scope Withdrawal Time: 0 hours 16 minutes 37 seconds  Total Procedure Duration: 0 hours 26 minutes 52 seconds  Findings:      A 5 mm polyp was found in the sigmoid colon. The polyp was sessile. The       polyp was removed with a cold snare. Resection and retrieval were       complete.      Non-bleeding internal hemorrhoids were found during retroflexion. Impression:               -  One 5 mm polyp in the sigmoid colon, removed with                            a cold snare. Resected and retrieved.                           - Non-bleeding internal hemorrhoids. Moderate Sedation:      Not Applicable - Patient had care per Anesthesia. Recommendation:           - Discharge patient to home (with escort).                           - Await pathology results.                           - Will need 2-day preps in the future for                            colonoscopies.                           - The findings and recommendations were discussed                            with the patient. Procedure Code(s):        --- Professional ---                           [id number], Colonoscopy, flexible; with removal of                             tumor(s), polyp(s), or other lesion(s) by snare                            technique Diagnosis Code(s):        --- Professional ---                           Z86.010, Personal history of colonic polyps                           D12.5, Benign neoplasm of sigmoid colon                           K64.8, Other hemorrhoids CPT copyright 2022 American Medical Association. All rights reserved. The codes documented in this report are preliminary and upon coder review may  be revised to meet current compliance requirements. Dr Estefana Federico Rosario Estefana Federico,  06/30/2024 10:19:25 AM Number of Addenda: 0

## 2024-06-30 NOTE — Anesthesia Preprocedure Evaluation (Addendum)
 Anesthesia Evaluation  Patient identified by MRN, date of birth, ID band Patient awake    Reviewed: Allergy & Precautions, NPO status , Patient's Chart, lab work & pertinent test results  Airway Mallampati: II  TM Distance: >3 FB Neck ROM: Full    Dental  (+) Missing   Pulmonary sleep apnea , COPD,  COPD inhaler, former smoker   Pulmonary exam normal        Cardiovascular Normal cardiovascular exam     Neuro/Psych  Headaches, Seizures -,  PSYCHIATRIC DISORDERS Anxiety Depression Bipolar Disorder   Cerebral palsy  Neuromuscular disease    GI/Hepatic negative GI ROS, Neg liver ROS,,,  Endo/Other  diabetes, Oral Hypoglycemic Agents    Renal/GU negative Renal ROS     Musculoskeletal  (+) Arthritis ,    Abdominal   Peds  Hematology negative hematology ROS (+)   Anesthesia Other Findings Colon cancer screening  Reproductive/Obstetrics                              Anesthesia Physical Anesthesia Plan  ASA: 3  Anesthesia Plan: MAC   Post-op Pain Management:    Induction:   PONV Risk Score and Plan: 2 and Propofol  infusion and Treatment may vary due to age or medical condition  Airway Management Planned: Nasal Cannula  Additional Equipment:   Intra-op Plan:   Post-operative Plan:   Informed Consent: I have reviewed the patients History and Physical, chart, labs and discussed the procedure including the risks, benefits and alternatives for the proposed anesthesia with the patient or authorized representative who has indicated his/her understanding and acceptance.     Dental advisory given  Plan Discussed with: CRNA  Anesthesia Plan Comments:         Anesthesia Quick Evaluation

## 2024-06-30 NOTE — Discharge Instructions (Signed)

## 2024-07-01 NOTE — Anesthesia Postprocedure Evaluation (Signed)
 Anesthesia Post Note  Patient: DEEANN SERVIDIO  Procedure(s) Performed: COLONOSCOPY EGD (ESOPHAGOGASTRODUODENOSCOPY)     Patient location during evaluation: Endoscopy Anesthesia Type: MAC Level of consciousness: awake Pain management: pain level controlled Vital Signs Assessment: post-procedure vital signs reviewed and stable Respiratory status: spontaneous breathing, nonlabored ventilation and respiratory function stable Cardiovascular status: blood pressure returned to baseline and stable Postop Assessment: no apparent nausea or vomiting Anesthetic complications: no   No notable events documented.  Last Vitals:  Vitals:   06/30/24 1030 06/30/24 1040  BP: 127/77 136/69  Pulse: 88 81  Resp: 16 17  Temp:    SpO2: 97% 100%    Last Pain:  Vitals:   06/30/24 1040  TempSrc:   PainSc: 4                  Donnita Farina P Aliena Ghrist

## 2024-07-02 ENCOUNTER — Ambulatory Visit: Payer: Self-pay | Admitting: Internal Medicine

## 2024-07-02 LAB — SURGICAL PATHOLOGY

## 2024-07-16 ENCOUNTER — Ambulatory Visit (HOSPITAL_BASED_OUTPATIENT_CLINIC_OR_DEPARTMENT_OTHER): Admitting: Psychiatry

## 2024-07-16 ENCOUNTER — Encounter (HOSPITAL_COMMUNITY): Payer: Self-pay | Admitting: Psychiatry

## 2024-07-16 VITALS — BP 134/79 | HR 91 | Ht 67.0 in | Wt 235.0 lb

## 2024-07-16 DIAGNOSIS — R413 Other amnesia: Secondary | ICD-10-CM

## 2024-07-16 DIAGNOSIS — F419 Anxiety disorder, unspecified: Secondary | ICD-10-CM

## 2024-07-16 DIAGNOSIS — F319 Bipolar disorder, unspecified: Secondary | ICD-10-CM

## 2024-07-16 MED ORDER — AMITRIPTYLINE HCL 50 MG PO TABS: 50.0000 mg | ORAL_TABLET | Freq: Every day | ORAL | 0 refills | Status: AC

## 2024-07-16 MED ORDER — LAMOTRIGINE 200 MG PO TABS: 200.0000 mg | ORAL_TABLET | Freq: Every day | ORAL | 0 refills | Status: AC
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
## 2024-07-23 DIAGNOSIS — R3914 Feeling of incomplete bladder emptying: Secondary | ICD-10-CM | POA: Diagnosis not present

## 2024-07-23 DIAGNOSIS — N319 Neuromuscular dysfunction of bladder, unspecified: Secondary | ICD-10-CM | POA: Diagnosis not present

## 2024-07-27 ENCOUNTER — Other Ambulatory Visit (HOSPITAL_COMMUNITY): Payer: Self-pay | Admitting: Psychiatry

## 2024-07-27 DIAGNOSIS — G4710 Hypersomnia, unspecified: Secondary | ICD-10-CM | POA: Diagnosis not present

## 2024-07-27 DIAGNOSIS — F319 Bipolar disorder, unspecified: Secondary | ICD-10-CM

## 2024-07-29 DIAGNOSIS — N3021 Other chronic cystitis with hematuria: Secondary | ICD-10-CM | POA: Diagnosis not present

## 2024-07-29 DIAGNOSIS — R3914 Feeling of incomplete bladder emptying: Secondary | ICD-10-CM | POA: Diagnosis not present

## 2024-08-03 ENCOUNTER — Telehealth: Payer: Self-pay | Admitting: Family

## 2024-08-03 NOTE — Telephone Encounter (Signed)
 Pt called requesting a script for new scooter. She states [facility] helped with her last one. Insurance pay for either [employer] number [phone] or [facility] [phone]. Please call pt at [phone].

## 2024-08-04 NOTE — Telephone Encounter (Signed)
 Faxing Rx and demo/insurance to [employer]

## 2024-08-11 ENCOUNTER — Other Ambulatory Visit

## 2024-08-11 ENCOUNTER — Encounter: Payer: Self-pay | Admitting: Family

## 2024-08-11 ENCOUNTER — Ambulatory Visit (INDEPENDENT_AMBULATORY_CARE_PROVIDER_SITE_OTHER): Admitting: Family

## 2024-08-11 DIAGNOSIS — M5412 Radiculopathy, cervical region: Secondary | ICD-10-CM | POA: Diagnosis not present

## 2024-08-11 MED ORDER — PREDNISONE 50 MG PO TABS: ORAL_TABLET | ORAL | 0 refills | Status: AC

## 2024-08-11 NOTE — Progress Notes (Signed)
 Office Visit Note   Patient: [name]           Date of Birth: November 18, 1955           MRN: [id number] Visit Date: 08/11/2024              Requested by: Montey Lot, PA-C [address] [place],  [place] [address] PCP: Montey Lot, PA-C  Chief Complaint  Patient presents with   Right Wrist - Pain      HPI: The patient is a 68 year old woman who presents today complaining of right sided neck pain with associated shooting pain down the right upper extremity including the right wrist she is having weakness in the right hand numbness and tingling  Has a history of C5-C6 fusion with Dr. Gillie  Assessment & Plan: Visit Diagnoses:  1. Right cervical radiculopathy     Plan: Will hold off on further imaging.  Placed on a course of prednisone .  Referred back to Dr. Lindalee.  Have also referred to Dr. Eldonna for consideration of epidural steroid injection  Follow-Up Instructions: Return if symptoms worsen or fail to improve.   Back Exam   Tenderness  The patient is experiencing tenderness in the cervical.  Comments:  Negative Spurling   Right Hand Exam   Range of Motion  The patient has normal right wrist ROM.   Muscle Strength  Grip: 2/5   Other  Pulse: present   Left Hand Exam   Range of Motion  The patient has normal left wrist ROM.  Muscle Strength  Grip:  3/5   Other  Pulse: present      Patient is alert, oriented, no adenopathy, well-dressed, normal affect, normal respiratory effort.     Imaging: No results found. No images are attached to the encounter.  Labs: Lab Results  Component Value Date   HGBA1C 6.0 (H) 05/30/2023   HGBA1C 7.2 (H) 11/29/2017   HGBA1C 6.6 (H) 07/30/2017   REPTSTATUS 03/13/2024 FINAL 03/10/2024   CULT (A) 03/10/2024    >=100,000 COLONIES/mL ESCHERICHIA COLI >=100,000 COLONIES/mL KLEBSIELLA PNEUMONIAE    LABORGA ESCHERICHIA COLI (A) 03/10/2024   LABORGA KLEBSIELLA PNEUMONIAE (A) 03/10/2024      Lab Results  Component Value Date   ALBUMIN 4.5 12/15/2015   ALBUMIN 3.9 11/18/2013   ALBUMIN 3.9 07/07/2013    Lab Results  Component Value Date   MG 1.9 07/08/2013   No results found for: VD25OH  No results found for: PREALBUMIN    Latest Ref Rng & Units 09/11/2023   10:33 AM 07/25/2023    5:08 PM 05/16/2023    6:09 PM  CBC EXTENDED  WBC 4.0 - 10.5 K/uL 15.9  11.8  14.0   RBC 3.87 - 5.11 MIL/uL 5.31  5.00  5.33   Hemoglobin 12.0 - 15.0 g/dL 84.8  85.2  84.8   HCT 36.0 - 46.0 % 46.8  45.7  46.7   Platelets 150 - 400 K/uL 416  381  414      There is no height or weight on file to calculate BMI.  Orders:  Orders Placed This Encounter  Procedures   XR Cervical Spine 2 or 3 views   Ambulatory referral to Physical Medicine Rehab   Ambulatory referral to Spine Surgery   No orders of the defined types were placed in this encounter.    Procedures: No procedures performed  Clinical Data: No additional findings.  ROS:  All other systems negative, except as noted in  the HPI. Review of Systems  Objective: Vital Signs: There were no vitals taken for this visit.  Specialty Comments:  No specialty comments available.  PMFS History: Patient Active Problem List   Diagnosis Date Noted   Bilateral shoulder pain 08/09/2022   Neck pain 11/21/2018   Low back pain 08/19/2018   Weakness of both lower extremities 08/19/2018   Pain in finger of left hand 05/28/2018   Pain in right hand 05/28/2018   Pain in left elbow 04/02/2018   Chronic right shoulder pain 07/16/2017   Rotator cuff syndrome, right 05/13/2017   Cervical radiculopathy 01/14/2017   Colon cancer screening    Encounter for screening for gastric cancer    Osteoarthritis of spine with radiculopathy, cervical region 05/25/2016   Postmenopausal bleeding 08/04/2015   Acute respiratory failure (HCC) 07/19/2013   Nocturnal hypoxemia 07/07/2013   Lower urinary tract infectious disease 07/07/2013    Hypokalemia 07/07/2013   Abdominal pain, acute, right lower quadrant 07/07/2013   Benign paroxysmal positional vertigo 02/24/2013   Edema of both legs 02/24/2013   Dysplasia of cervix    Melanoma in situ [facility])    Bipolar 1 disorder (HCC) 02/08/2012   IBS 12/04/2007   RASH AND OTHER NONSPECIFIC SKIN ERUPTION 12/04/2007   EMPHYSEMA by CT only 09/05/2007   NECK MASS 09/05/2007   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HCC) 08/13/2007   Cerebral palsy (HCC) 08/13/2007   OTITIS EXTERNA, ACUTE 08/13/2007   IRRITABLE BOWEL SYNDROME, HX OF 08/13/2007   HYPERLIPIDEMIA 05/10/2007   DEPRESSION 05/10/2007   ALLERGIC RHINITIS 05/10/2007   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HCC)    Anxiety    Arthritis    Benign paroxysmal positional vertigo 02/24/2013   Bipolar disorder (HCC)    Cancer (HCC)    skin cancer   Cathard    Cerebral palsy (HCC)    Colitis    Depression    bi polar   Diabetes mellitus, type II (HCC)    Dysplasia of cervix    Foley catheter in place    GERD (gastroesophageal reflux disease)    Hardening of the arteries of the heart 2023   Headache(784.0)    High cholesterol    History of chicken pox    History of measles    History of mumps    IBS (irritable bowel syndrome)    Low potassium syndrome    Melanoma in situ (HCC)    Neuromuscular disorder (HCC)    cerebral palsy   Ovarian cyst    Peripheral neuropathy    Pneumonia    Seizures (HCC)    Sepsis (HCC)    Sleep apnea    no cpap   Stenosis, cervical spine 02/2015   Trichomonas     Family History  Problem Relation Age of Onset   Suicidality Father    Depression Father    Alcohol abuse Father    Emphysema Father        smoked   Drug abuse Brother    Alcohol abuse Brother    Esophageal cancer Brother    Depression Maternal Aunt    Alcohol abuse Brother    Anxiety disorder Daughter    Depression Daughter    Suicidality Daughter    Alcohol abuse Daughter    Drug abuse Son    Alcohol abuse Son     Emphysema Sister        smoked    Past Surgical History:  Procedure Laterality Date   ANTERIOR CERVICAL DECOMP/DISCECTOMY  FUSION N/A 05/25/2016   Procedure: Cervical five-six Anterior cervical decompression/diskectomy/fusion;  Surgeon: Rockey Peru, MD;  [facility]: [facility];  Service: Neurosurgery;  Laterality: N/A;   APPENDECTOMY     CARPAL TUNNEL RELEASE     CARPAL TUNNEL RELEASE Right 08/02/2017   Procedure: CARPAL TUNNEL RELEASE RIGHT;  Surgeon: Peru Rockey, MD;  Location: [facility];  Service: Neurosurgery;  Laterality: Right;  CARPAL TUNNEL RELEASE RIGHT   CARPAL TUNNEL RELEASE Left 11/29/2017   Procedure: CARPAL TUNNEL RELEASE LEFT;  Surgeon: Peru Rockey, MD;  [facility];  Service: Neurosurgery;  Laterality: Left;  CARPAL TUNNEL RELEASE LEFT   COLONOSCOPY N/A 06/30/2024   Procedure: COLONOSCOPY;  Surgeon: Federico Rosario BROCKS, MD;  Location: [facility];  Service: Gastroenterology;  Laterality: N/A;   COLONOSCOPY WITH PROPOFOL  N/A 11/29/2016   Procedure: COLONOSCOPY WITH PROPOFOL ;  Surgeon: Toribio SHAUNNA Cedar, MD;  Location: [facility];  Service: Endoscopy;  Laterality: N/A;   DILATATION & CURETTAGE/HYSTEROSCOPY WITH MYOSURE  09/11/2023   Procedure: DILATATION & CURETTAGE/HYSTEROSCOPY WITH MYOSURE;  Surgeon: Jeralyn Crutch, MD;  [facility]: [facility];  Service: Gynecology;;   ESOPHAGOGASTRODUODENOSCOPY N/A 06/30/2024   Procedure: EGD (ESOPHAGOGASTRODUODENOSCOPY);  Surgeon: Federico Rosario BROCKS, MD;  Location: [facility];  Service: Gastroenterology;  Laterality: N/A;   ESOPHAGOGASTRODUODENOSCOPY (EGD) WITH PROPOFOL  N/A 11/29/2016   Procedure: ESOPHAGOGASTRODUODENOSCOPY (EGD) WITH PROPOFOL ;  Surgeon: Toribio SHAUNNA Cedar, MD;  [facility];  Service: Endoscopy;  Laterality: N/A;   IR INJECT/THERA/INC NEEDLE/CATH/PLC EPI/CERV/THOR W/IMG  03/04/2020   LEG TENDON SURGERY     NASAL FRACTURE SURGERY     OPERATIVE ULTRASOUND N/A 09/11/2023   Procedure: OPERATIVE ULTRASOUND;  Surgeon: Jeralyn Crutch, MD;  [facility];  Service: Gynecology;  Laterality: N/A;   OVARIAN CYST REMOVAL     REPLACEMENT TOTAL KNEE BILATERAL  10/29/2004   rotator cuff surgery Right    Social History   Occupational History   Occupation: Disabled    Employer: [employer]  Tobacco Use   Smoking status: Former    Current packs/day: 0.00    Average packs/day: 1 pack/day for 31.0 years (31.0 ttl pk-yrs)    Types: Cigarettes    Start date: 11/17/1968    Quit date: 11/18/1999    Years since quitting: 24.7   Smokeless tobacco: Never  Vaping Use   Vaping status: Never Used  Substance and Sexual Activity   Alcohol use: Not Currently    Comment: rare   Drug use: No   Sexual activity: Not Currently

## 2024-08-12 DIAGNOSIS — K219 Gastro-esophageal reflux disease without esophagitis: Secondary | ICD-10-CM | POA: Diagnosis not present

## 2024-08-12 DIAGNOSIS — R262 Difficulty in walking, not elsewhere classified: Secondary | ICD-10-CM | POA: Diagnosis not present

## 2024-08-12 DIAGNOSIS — N390 Urinary tract infection, site not specified: Secondary | ICD-10-CM | POA: Diagnosis not present

## 2024-08-12 DIAGNOSIS — R404 Transient alteration of awareness: Secondary | ICD-10-CM | POA: Diagnosis not present

## 2024-08-12 DIAGNOSIS — M810 Age-related osteoporosis without current pathological fracture: Secondary | ICD-10-CM | POA: Diagnosis not present

## 2024-08-12 DIAGNOSIS — E782 Mixed hyperlipidemia: Secondary | ICD-10-CM | POA: Diagnosis not present

## 2024-08-12 DIAGNOSIS — Z23 Encounter for immunization: Secondary | ICD-10-CM | POA: Diagnosis not present

## 2024-08-12 DIAGNOSIS — G629 Polyneuropathy, unspecified: Secondary | ICD-10-CM | POA: Diagnosis not present

## 2024-08-12 DIAGNOSIS — K760 Fatty (change of) liver, not elsewhere classified: Secondary | ICD-10-CM | POA: Diagnosis not present

## 2024-08-12 DIAGNOSIS — G809 Cerebral palsy, unspecified: Secondary | ICD-10-CM | POA: Diagnosis not present

## 2024-08-12 DIAGNOSIS — N319 Neuromuscular dysfunction of bladder, unspecified: Secondary | ICD-10-CM | POA: Diagnosis not present

## 2024-08-12 DIAGNOSIS — E118 Type 2 diabetes mellitus with unspecified complications: Secondary | ICD-10-CM | POA: Diagnosis not present

## 2024-08-12 DIAGNOSIS — R609 Edema, unspecified: Secondary | ICD-10-CM | POA: Diagnosis not present

## 2024-08-17 ENCOUNTER — Other Ambulatory Visit: Payer: Self-pay | Admitting: Physician Assistant

## 2024-08-17 DIAGNOSIS — Z1231 Encounter for screening mammogram for malignant neoplasm of breast: Secondary | ICD-10-CM

## 2024-08-18 ENCOUNTER — Telehealth: Payer: Self-pay | Admitting: Orthopedic Surgery

## 2024-08-18 NOTE — Telephone Encounter (Signed)
 Could it be for physical therapy? Please see below.

## 2024-08-18 NOTE — Telephone Encounter (Signed)
 Pt called stating someone called her, but didn't leave a message. Pt call back number is [phone]

## 2024-08-29 DIAGNOSIS — N3021 Other chronic cystitis with hematuria: Secondary | ICD-10-CM | POA: Diagnosis not present

## 2024-08-29 DIAGNOSIS — R3914 Feeling of incomplete bladder emptying: Secondary | ICD-10-CM | POA: Diagnosis not present

## 2024-08-31 ENCOUNTER — Encounter: Payer: Self-pay | Admitting: Radiology

## 2024-10-02 ENCOUNTER — Ambulatory Visit

## 2024-10-26 ENCOUNTER — Other Ambulatory Visit (HOSPITAL_COMMUNITY): Payer: Self-pay | Admitting: Psychiatry

## 2024-10-26 DIAGNOSIS — R413 Other amnesia: Secondary | ICD-10-CM

## 2024-10-26 DIAGNOSIS — F419 Anxiety disorder, unspecified: Secondary | ICD-10-CM

## 2024-10-26 DIAGNOSIS — F319 Bipolar disorder, unspecified: Secondary | ICD-10-CM

## 2024-11-03 ENCOUNTER — Ambulatory Visit: Admission: EM | Admit: 2024-11-03 | Discharge: 2024-11-03 | Disposition: A | Discharge status: Home / Self Care

## 2024-11-03 ENCOUNTER — Encounter: Payer: Self-pay | Admitting: Emergency Medicine

## 2024-11-03 DIAGNOSIS — Z87891 Personal history of nicotine dependence: Secondary | ICD-10-CM | POA: Diagnosis not present

## 2024-11-03 DIAGNOSIS — R300 Dysuria: Secondary | ICD-10-CM | POA: Diagnosis not present

## 2024-11-03 DIAGNOSIS — J019 Acute sinusitis, unspecified: Secondary | ICD-10-CM | POA: Diagnosis not present

## 2024-11-03 DIAGNOSIS — N3001 Acute cystitis with hematuria: Secondary | ICD-10-CM | POA: Diagnosis not present

## 2024-11-03 DIAGNOSIS — J3489 Other specified disorders of nose and nasal sinuses: Secondary | ICD-10-CM | POA: Diagnosis present

## 2024-11-03 LAB — POCT URINE DIPSTICK
Bilirubin, UA: NEGATIVE
Glucose, UA: NEGATIVE mg/dL
Ketones, POC UA: NEGATIVE mg/dL
Nitrite, UA: POSITIVE — AB
Protein Ur, POC: 100 mg/dL — AB
Spec Grav, UA: >=1.03 — AB
Urobilinogen, UA: 0.2 U/dL
pH, UA: 6

## 2024-11-03 MED ORDER — AMOXICILLIN-POT CLAVULANATE 875-125 MG PO TABS: 1.0000 | ORAL_TABLET | Freq: Two times a day (BID) | ORAL | 0 refills | Status: AC

## 2024-11-03 NOTE — Discharge Instructions (Addendum)
 I have sent Augmentin  to the pharmacy for you UTI and it will also cover your sinuses. I believe your sinus infection is viral as your symptoms have been going on for 3 days. In addition to flonase  use may use Sudafed and sinus rinse for symptoms.

## 2024-11-03 NOTE — ED Provider Notes (Signed)
 " [facility]    CSN: [id number] Arrival date & time: 11/03/24  1323      History   Chief Complaint Chief Complaint  Patient presents with   UTI   Sinus Problem    HPI [name] is a 69 y.o. female.   Pt presents today due dysuria for more than 7 days. Pt states that she was diagnosed with a UTI when she was released from rehab 1 week ago. Pt states that she was prescribed an antibiotic but it was ineffective. Pt cannot remember the name of the antibiotic. Pt states that she has also been experiencing 3 days of nasal congestion and sinus pain. Pt states that she uses flonase  daily without relief of symptoms.   The history is provided by the patient.  Sinus Problem    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HCC)    Anxiety    Arthritis    Benign paroxysmal positional vertigo 02/24/2013   Bipolar disorder (HCC)    Cancer (HCC)    skin cancer   [place]    Cerebral palsy (HCC)    Colitis    Depression    bi polar   Diabetes mellitus, type II (HCC)    Dysplasia of cervix    Foley catheter in place    GERD (gastroesophageal reflux disease)    Hardening of the arteries of the heart 2023   Headache(784.0)    High cholesterol    History of chicken pox    History of measles    History of mumps    IBS (irritable bowel syndrome)    Low potassium syndrome    Melanoma in situ (HCC)    Neuromuscular disorder (HCC)    cerebral palsy   Ovarian cyst    Peripheral neuropathy    Pneumonia    Seizures (HCC)    Sepsis (HCC)    Sleep apnea    no cpap   Stenosis, cervical spine 02/2015   Trichomonas     Patient Active Problem List   Diagnosis Date Noted   Bilateral shoulder pain 08/09/2022   Neck pain 11/21/2018   Low back pain 08/19/2018   Weakness of both lower extremities 08/19/2018   Pain in finger of left hand 05/28/2018   Pain in right hand 05/28/2018   Pain in left elbow 04/02/2018   Chronic right shoulder pain 07/16/2017    Rotator cuff syndrome, right 05/13/2017   Cervical radiculopathy 01/14/2017   Colon cancer screening    Encounter for screening for gastric cancer    Osteoarthritis of spine with radiculopathy, cervical region 05/25/2016   Postmenopausal bleeding 08/04/2015   Acute respiratory failure (HCC) 07/19/2013   Nocturnal hypoxemia 07/07/2013   Lower urinary tract infectious disease 07/07/2013   Hypokalemia 07/07/2013   Abdominal pain, acute, right lower quadrant 07/07/2013   Benign paroxysmal positional vertigo 02/24/2013   Edema of both legs 02/24/2013   Dysplasia of cervix    Melanoma in situ [facility])    Bipolar 1 disorder (HCC) 02/08/2012   IBS 12/04/2007   RASH AND OTHER NONSPECIFIC SKIN ERUPTION 12/04/2007   EMPHYSEMA by CT only 09/05/2007   NECK MASS 09/05/2007   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HCC) 08/13/2007   Cerebral palsy (HCC) 08/13/2007   OTITIS EXTERNA, ACUTE 08/13/2007   IRRITABLE BOWEL SYNDROME, HX OF 08/13/2007   HYPERLIPIDEMIA 05/10/2007   DEPRESSION 05/10/2007   ALLERGIC RHINITIS 05/10/2007    Past Surgical History:  Procedure Laterality Date   ANTERIOR CERVICAL DECOMP/DISCECTOMY  FUSION N/A 05/25/2016   Procedure: Cervical five-six Anterior cervical decompression/diskectomy/fusion;  Surgeon: Rockey Peru, MD;  Location: [facility];  Service: Neurosurgery;  Laterality: N/A;   APPENDECTOMY     CARPAL TUNNEL RELEASE     CARPAL TUNNEL RELEASE Right 08/02/2017   Procedure: CARPAL TUNNEL RELEASE RIGHT;  Surgeon: Peru Rockey, MD;  Location: [facility];  Service: Neurosurgery;  Laterality: Right;  CARPAL TUNNEL RELEASE RIGHT   CARPAL TUNNEL RELEASE Left 11/29/2017   Procedure: CARPAL TUNNEL RELEASE LEFT;  Surgeon: Peru Rockey, MD;  Location: [facility];  Service: Neurosurgery;  Laterality: Left;  CARPAL TUNNEL RELEASE LEFT   COLONOSCOPY N/A 06/30/2024   Procedure: COLONOSCOPY;  Surgeon: Federico Rosario BROCKS, MD;  Location: [facility];  Service: Gastroenterology;  Laterality: N/A;   COLONOSCOPY WITH  PROPOFOL  N/A 11/29/2016   Procedure: COLONOSCOPY WITH PROPOFOL ;  Surgeon: Toribio SHAUNNA Cedar, MD;  Location: [facility];  Service: Endoscopy;  Laterality: N/A;   DILATATION & CURETTAGE/HYSTEROSCOPY WITH MYOSURE  09/11/2023   Procedure: DILATATION & CURETTAGE/HYSTEROSCOPY WITH MYOSURE;  Surgeon: Jeralyn Crutch, MD;  Location: [facility];  Service: Gynecology;;   ESOPHAGOGASTRODUODENOSCOPY N/A 06/30/2024   Procedure: EGD (ESOPHAGOGASTRODUODENOSCOPY);  Surgeon: Federico Rosario BROCKS, MD;  Location: [facility];  Service: Gastroenterology;  Laterality: N/A;   ESOPHAGOGASTRODUODENOSCOPY (EGD) WITH PROPOFOL  N/A 11/29/2016   Procedure: ESOPHAGOGASTRODUODENOSCOPY (EGD) WITH PROPOFOL ;  Surgeon: Toribio SHAUNNA Cedar, MD;  Location: [facility];  Service: Endoscopy;  Laterality: N/A;   IR INJECT/THERA/INC NEEDLE/CATH/PLC EPI/CERV/THOR W/IMG  03/04/2020   LEG TENDON SURGERY     NASAL FRACTURE SURGERY     OPERATIVE ULTRASOUND N/A 09/11/2023   Procedure: OPERATIVE ULTRASOUND;  Surgeon: Jeralyn Crutch, MD;  Location: [facility];  Service: Gynecology;  Laterality: N/A;   OVARIAN CYST REMOVAL     REPLACEMENT TOTAL KNEE BILATERAL  10/29/2004   rotator cuff surgery Right     OB History     Gravida  3   Para  2   Term  2   Preterm      AB  1   Living  2      SAB      IAB      Ectopic      Multiple      Live Births               Home Medications    Prior to Admission medications  Medication Sig Start Date End Date Taking? Authorizing Provider  amoxicillin -clavulanate (AUGMENTIN ) 875-125 MG tablet Take 1 tablet by mouth every 12 (twelve) hours for 10 days. 11/03/24 11/13/24 Yes Andra Corean BROCKS, PA-C  aspirin  81 MG chewable tablet Chew 81 mg by mouth. 09/23/24 11/04/24 Yes [provider]  busPIRone  (BUSPAR ) 10 MG tablet Take 10 mg by mouth. 03/12/14  Yes [provider]  clotrimazole-betamethasone (LOTRISONE) lotion Apply topically 2 (two) times daily. 09/07/24  Yes [provider]  DYMISTA 137-50 MCG/ACT SUSP 1 (one) spray per nostril twice a day 06/02/24  Yes [provider]  acetaminophen  (TYLENOL ) 650 MG CR tablet Take 650 mg by mouth.    [provider]  albuterol  (PROVENTIL  HFA;VENTOLIN  HFA) 108 (90 Base) MCG/ACT inhaler Inhale 1-2 puffs into the lungs every 6 (six) hours as needed for wheezing or shortness of breath.    [provider]  amitriptyline  (ELAVIL ) 50 MG tablet Take 1 tablet (50 mg total) by mouth at bedtime. 07/16/24   Curry Leni DASEN, MD  AQUALANCE LANCETS 30G MISC  04/25/18  [provider]  Ascorbic Acid  (VITAMIN C ) 500 MG CAPS Take 500 mg by mouth at bedtime.     [provider]  atorvastatin  (LIPITOR) 40 MG tablet 1 tablet    [provider]  B Complex Vitamins (B COMPLEX 1 PO) Take by mouth. 03/12/14   [provider]  Blood Glucose Monitoring Suppl (ONE TOUCH ULTRA 2) w/Device KIT  05/05/18   [provider]  fluticasone  (FLONASE ) 50 MCG/ACT nasal spray Pt states not taking    [provider]  gabapentin  (NEURONTIN ) 600 MG tablet Take 100 mg by mouth 2 (two) times daily. 02/20/23   Hamrick, Maura L, MD  glipiZIDE (GLUCOTROL XL) 10 MG 24 hr tablet Take 5 mg by mouth daily. 04/08/19   [provider]  lamoTRIgine  (LAMICTAL ) 200 MG tablet Take 1 tablet (200 mg total) by mouth daily. 07/16/24   Curry Leni DASEN, MD  Lancet Devices (ADJUSTABLE LANCING DEVICE) MISC  05/05/18   [provider]  magnesium  30 MG tablet Take 30 mg by mouth daily.    [provider]  metFORMIN  (GLUCOPHAGE -XR) 500 MG 24 hr tablet Take 500 mg by mouth daily with breakfast. IN THE MORNING.    [provider]  methocarbamol  (ROBAXIN ) 500 MG tablet Take 1 tablet (500 mg total) by mouth 3 (three) times daily. Patient taking differently: Take 500 mg by mouth 3 (three) times daily. Pt states she is not taking this medication 01/22/24   Zamora, Erin R, NP  misoprostol   (CYTOTEC ) 200 MCG tablet Place two tablets in the vagina the night prior to your procedure 08/02/23   Ajewole, Christana, MD  Multiple Vitamins-Minerals (WOMENS 50+ MULTI VITAMIN PO) Take by mouth.    [provider]  Na Sulfate-K Sulfate-Mg Sulfate concentrate (SUPREP) 17.5-3.13-1.6 GM/177ML SOLN Use as directed; may use generic; goodrx card if insurance will not cover generic 05/06/24   Federico Rosario BROCKS, MD  Niacin-Polypodium Leucotomos [facility] ADVANCED) 250-120 MG CAPS Take by mouth daily.    [provider]  ondansetron  (ZOFRAN -ODT) 4 MG disintegrating tablet Take 1 tablet (4 mg total) by mouth every 8 (eight) hours as needed for nausea or vomiting. 03/10/24   Janet Therisa PARAS, FNP  ONE TOUCH ULTRA TEST test strip  05/05/18   [provider]  orlistat (XENICAL) 120 MG capsule Take by mouth.    [provider]  pilocarpine (SALAGEN) 5 MG tablet Take by mouth. Pt. States not taking    [provider]  Polyethyl Glycol-Propyl Glycol 0.4-0.3 % SOLN Place 1-2 drops into both eyes 3 (three) times daily as needed (for dry eyes.).    [provider]  potassium chloride  (KLOR-CON ) 10 MEQ tablet Take 10 mEq by mouth daily. 05/16/21   [provider]  predniSONE  (DELTASONE ) 50 MG tablet Take one tablet by mouth once daily for 5 days. 08/11/24   Zamora, Erin R, NP  Propylene Glycol-Glycerin 1-0.3 % SOLN Apply 1 drop to eye in the morning and at bedtime.    [provider]  risedronate (ACTONEL) 150 MG tablet Take 150 mg by mouth every 30 (thirty) days. 06/09/20   [provider]  spironolactone  (ALDACTONE ) 100 MG tablet Take 100 mg by mouth daily. 01/28/15   [provider]    Family History Family History  Problem Relation Age of Onset   Suicidality Father    Depression Father    Alcohol abuse Father    Emphysema Father  smoked   Drug abuse Brother    Alcohol abuse Brother    Esophageal cancer Brother     Depression Maternal Aunt    Alcohol abuse Brother    Anxiety disorder Daughter    Depression Daughter    Suicidality Daughter    Alcohol abuse Daughter    Drug abuse Son    Alcohol abuse Son    Emphysema Sister        smoked    Social History Social History[1]   Allergies   Miconazole, Sulfonamide derivatives, Tioconazole, and Miconazole nitrate   Review of Systems Review of Systems   Physical Exam Triage Vital Signs ED Triage Vitals  Encounter Vitals Group     BP 11/03/24 1407 126/80     Girls Systolic BP Percentile --      Girls Diastolic BP Percentile --      Boys Systolic BP Percentile --      Boys Diastolic BP Percentile --      Pulse Rate 11/03/24 1407 85     Resp 11/03/24 1407 18     Temp 11/03/24 1407 (!) 97.5 F (36.4 C)     Temp Source 11/03/24 1407 Oral     SpO2 11/03/24 1407 97 %     Weight 11/03/24 1407 235 lb 0.2 oz (106.6 kg)     Height --      Head Circumference --      Peak Flow --      Pain Score 11/03/24 1405 5     Pain Loc --      Pain Education --      Exclude from Growth Chart --    No data found.  Updated Vital Signs BP 126/80 (BP Location: Left Arm)   Pulse 85   Temp (!) 97.5 F (36.4 C) (Oral)   Resp 18   Wt 235 lb 0.2 oz (106.6 kg)   SpO2 97%   BMI 36.81 kg/m   Visual Acuity Right Eye Distance:   Left Eye Distance:   Bilateral Distance:    Right Eye Near:   Left Eye Near:    Bilateral Near:     Physical Exam Vitals and nursing note reviewed.  Constitutional:      General: She is not in acute distress.    Appearance: Normal appearance. She is not ill-appearing, toxic-appearing or diaphoretic.  HENT:     Nose: Congestion (mildly enlarged turbinates) present. No rhinorrhea.     Right Sinus: Maxillary sinus tenderness present. No frontal sinus tenderness.     Left Sinus: Maxillary sinus tenderness present. No frontal sinus tenderness.     Comments: No tenderness to palpation of upper lip    Mouth/Throat:     Mouth:  Mucous membranes are moist.     Pharynx: Oropharynx is clear. No oropharyngeal exudate or posterior oropharyngeal erythema.  Eyes:     General: No scleral icterus. Cardiovascular:     Rate and Rhythm: Normal rate and regular rhythm.     Heart sounds: Normal heart sounds.  Pulmonary:     Effort: Pulmonary effort is normal. No respiratory distress.     Breath sounds: Normal breath sounds. No wheezing or rhonchi.  Abdominal:     General: Abdomen is flat. Bowel sounds are normal.     Palpations: Abdomen is soft.     Tenderness: There is abdominal tenderness in the suprapubic area. There is no right CVA tenderness or left CVA tenderness.  Skin:    General: Skin is  warm.  Neurological:     Mental Status: She is alert and oriented to person, place, and time.  Psychiatric:        Mood and Affect: Mood normal.        Behavior: Behavior normal.      UC Treatments / Results  Labs (all labs ordered are listed, but only abnormal results are displayed) Labs Reviewed  POCT URINE DIPSTICK - Abnormal; Notable for the following components:      Result Value   Clarity, UA cloudy (*)    Spec Grav, UA >=1.030 (*)    Blood, UA small (*)    Protein Ur, POC =100 (*)    Nitrite, UA Positive (*)    Leukocytes, UA Trace (*)    All other components within normal limits  URINE CULTURE    EKG   Radiology No results found.  Procedures Procedures (including critical care time)  Medications Ordered in UC Medications - No data to display  Initial Impression / Assessment and Plan / UC Course  I have reviewed the triage vital signs and the nursing notes.  Pertinent labs & imaging results that were available during my care of the patient were reviewed by me and considered in my medical decision making (see chart for details).     Final Clinical Impressions(s) / UC Diagnoses   Final diagnoses:  Dysuria  Acute cystitis with hematuria  Acute sinusitis, recurrence not specified, unspecified  location     Discharge Instructions      I have sent Augmentin  to the pharmacy for you UTI and it will also cover your sinuses. I believe your sinus infection is viral as your symptoms have been going on for 3 days. In addition to flonase  use may use Sudafed and sinus rinse for symptoms.     ED Prescriptions     Medication Sig Dispense Auth. Provider   amoxicillin -clavulanate (AUGMENTIN ) 875-125 MG tablet Take 1 tablet by mouth every 12 (twelve) hours for 10 days. 20 tablet Andra Corean BROCKS, PA-C      PDMP not reviewed this encounter.     [1]  Social History Tobacco Use   Smoking status: Former    Current packs/day: 0.00    Average packs/day: 1 pack/day for 31.0 years (31.0 ttl pk-yrs)    Types: Cigarettes    Start date: 11/17/1968    Quit date: 11/18/1999    Years since quitting: 24.9    Passive exposure: Past   Smokeless tobacco: Never  Vaping Use   Vaping status: Never Used  Substance Use Topics   Alcohol use: Not Currently    Comment: rare   Drug use: No     Andra Corean BROCKS, PA-C 11/03/24 1512  "

## 2024-11-03 NOTE — ED Triage Notes (Signed)
 Pt presents c/o Sinus Infection x 3 days and UTI x 7+ days. Pt states,  I think I have a sinus infection and UTI. The sinus stuff started Sunday. The UTI I have already been diagnosed with and I finished the meds but I think I still have the UTI.  Pt denies any additional sxs.

## 2024-11-06 ENCOUNTER — Ambulatory Visit: Payer: Self-pay

## 2024-11-06 LAB — URINE CULTURE: Culture: >=100000 — AB

## 2024-12-31 ENCOUNTER — Ambulatory Visit (HOSPITAL_COMMUNITY): Admitting: Psychiatry
